# Patient Record
Sex: Female | Born: 1935 | Race: White | Hispanic: No | Smoking: Former smoker
Health system: Southern US, Community
[De-identification: ages and names within clinical notes are randomized; demographics above are authoritative.]

## PROBLEM LIST (undated history)

## (undated) DIAGNOSIS — F419 Anxiety disorder, unspecified: Secondary | ICD-10-CM

## (undated) DIAGNOSIS — J449 Chronic obstructive pulmonary disease, unspecified: Secondary | ICD-10-CM

## (undated) DIAGNOSIS — R609 Edema, unspecified: Secondary | ICD-10-CM

## (undated) DIAGNOSIS — I219 Acute myocardial infarction, unspecified: Secondary | ICD-10-CM

## (undated) DIAGNOSIS — I739 Peripheral vascular disease, unspecified: Secondary | ICD-10-CM

## (undated) DIAGNOSIS — IMO0001 Reserved for inherently not codable concepts without codable children: Secondary | ICD-10-CM

## (undated) DIAGNOSIS — K922 Gastrointestinal hemorrhage, unspecified: Secondary | ICD-10-CM

## (undated) DIAGNOSIS — M1990 Unspecified osteoarthritis, unspecified site: Secondary | ICD-10-CM

## (undated) DIAGNOSIS — I2510 Atherosclerotic heart disease of native coronary artery without angina pectoris: Secondary | ICD-10-CM

## (undated) DIAGNOSIS — K219 Gastro-esophageal reflux disease without esophagitis: Secondary | ICD-10-CM

## (undated) DIAGNOSIS — D649 Anemia, unspecified: Secondary | ICD-10-CM

## (undated) DIAGNOSIS — I10 Essential (primary) hypertension: Secondary | ICD-10-CM

## (undated) HISTORY — PX: CATARACT EXTRACTION W/ INTRAOCULAR LENS  IMPLANT, BILATERAL: SHX1307

## (undated) HISTORY — PX: CORONARY ANGIOPLASTY: SHX604

## (undated) HISTORY — PX: ABDOMINAL HYSTERECTOMY: SHX81

## (undated) HISTORY — PX: ABLATION: SHX5711

## (undated) HISTORY — PX: EYE SURGERY: SHX253

## (undated) HISTORY — PX: FRACTURE SURGERY: SHX138

---

## 2005-02-24 ENCOUNTER — Other Ambulatory Visit: Payer: Self-pay

## 2005-02-24 ENCOUNTER — Emergency Department: Payer: Self-pay | Admitting: Emergency Medicine

## 2005-05-12 ENCOUNTER — Encounter: Payer: Self-pay | Admitting: Cardiology

## 2005-05-26 ENCOUNTER — Encounter: Payer: Self-pay | Admitting: Cardiology

## 2005-06-26 ENCOUNTER — Encounter: Payer: Self-pay | Admitting: Cardiology

## 2005-07-26 ENCOUNTER — Encounter: Payer: Self-pay | Admitting: Cardiology

## 2005-08-26 ENCOUNTER — Encounter: Payer: Self-pay | Admitting: Cardiology

## 2006-04-08 ENCOUNTER — Ambulatory Visit: Payer: Self-pay | Admitting: Cardiology

## 2007-08-25 ENCOUNTER — Ambulatory Visit: Payer: Self-pay | Admitting: Internal Medicine

## 2007-09-19 ENCOUNTER — Ambulatory Visit: Payer: Self-pay | Admitting: Ophthalmology

## 2007-11-09 ENCOUNTER — Ambulatory Visit: Payer: Self-pay | Admitting: Ophthalmology

## 2007-11-09 ENCOUNTER — Other Ambulatory Visit: Payer: Self-pay

## 2007-11-14 ENCOUNTER — Ambulatory Visit: Payer: Self-pay | Admitting: Ophthalmology

## 2009-01-04 ENCOUNTER — Inpatient Hospital Stay: Payer: Self-pay | Admitting: Internal Medicine

## 2009-01-28 ENCOUNTER — Ambulatory Visit: Payer: Self-pay | Admitting: Gastroenterology

## 2011-06-01 ENCOUNTER — Inpatient Hospital Stay: Payer: Self-pay | Admitting: Specialist

## 2012-06-07 ENCOUNTER — Ambulatory Visit: Payer: Self-pay | Admitting: Gastroenterology

## 2014-01-23 ENCOUNTER — Ambulatory Visit: Payer: Self-pay | Admitting: Cardiology

## 2015-02-07 ENCOUNTER — Ambulatory Visit
Admit: 2015-02-07 | Disposition: A | Discharge status: Home / Self Care | Payer: Self-pay | Attending: Internal Medicine | Admitting: Internal Medicine

## 2016-02-12 ENCOUNTER — Encounter
Admission: RE | Admit: 2016-02-12 | Discharge: 2016-02-12 | Disposition: A | Discharge status: Home / Self Care | Payer: Medicare Other | Source: Ambulatory Visit | Attending: Orthopedic Surgery | Admitting: Orthopedic Surgery

## 2016-02-12 DIAGNOSIS — I10 Essential (primary) hypertension: Secondary | ICD-10-CM | POA: Diagnosis not present

## 2016-02-12 DIAGNOSIS — E785 Hyperlipidemia, unspecified: Secondary | ICD-10-CM | POA: Insufficient documentation

## 2016-02-12 DIAGNOSIS — Z01812 Encounter for preprocedural laboratory examination: Secondary | ICD-10-CM | POA: Diagnosis not present

## 2016-02-12 DIAGNOSIS — I2510 Atherosclerotic heart disease of native coronary artery without angina pectoris: Secondary | ICD-10-CM | POA: Diagnosis not present

## 2016-02-12 DIAGNOSIS — D126 Benign neoplasm of colon, unspecified: Secondary | ICD-10-CM | POA: Insufficient documentation

## 2016-02-12 DIAGNOSIS — M1611 Unilateral primary osteoarthritis, right hip: Secondary | ICD-10-CM | POA: Diagnosis not present

## 2016-02-12 DIAGNOSIS — D649 Anemia, unspecified: Secondary | ICD-10-CM | POA: Diagnosis not present

## 2016-02-12 DIAGNOSIS — Z79899 Other long term (current) drug therapy: Secondary | ICD-10-CM | POA: Diagnosis not present

## 2016-02-12 HISTORY — DX: Edema, unspecified: R60.9

## 2016-02-12 HISTORY — DX: Atherosclerotic heart disease of native coronary artery without angina pectoris: I25.10

## 2016-02-12 HISTORY — DX: Essential (primary) hypertension: I10

## 2016-02-12 HISTORY — DX: Anemia, unspecified: D64.9

## 2016-02-12 HISTORY — DX: Chronic obstructive pulmonary disease, unspecified: J44.9

## 2016-02-12 HISTORY — DX: Gastro-esophageal reflux disease without esophagitis: K21.9

## 2016-02-12 HISTORY — DX: Gastrointestinal hemorrhage, unspecified: K92.2

## 2016-02-12 HISTORY — DX: Reserved for inherently not codable concepts without codable children: IMO0001

## 2016-02-12 HISTORY — DX: Anxiety disorder, unspecified: F41.9

## 2016-02-12 HISTORY — DX: Acute myocardial infarction, unspecified: I21.9

## 2016-02-12 HISTORY — DX: Unspecified osteoarthritis, unspecified site: M19.90

## 2016-02-12 HISTORY — DX: Peripheral vascular disease, unspecified: I73.9

## 2016-02-12 LAB — URINALYSIS COMPLETE WITH MICROSCOPIC (ARMC ONLY)
Bilirubin Urine: NEGATIVE
GLUCOSE, UA: NEGATIVE mg/dL
KETONES UR: NEGATIVE mg/dL
Leukocytes, UA: NEGATIVE
NITRITE: NEGATIVE
PROTEIN: NEGATIVE mg/dL
SPECIFIC GRAVITY, URINE: 1.004 — AB (ref 1.005–1.030)
pH: 7 (ref 5.0–8.0)

## 2016-02-12 LAB — BASIC METABOLIC PANEL WITH GFR
Anion gap: 6 (ref 5–15)
BUN: 24 mg/dL — ABNORMAL HIGH (ref 6–20)
CO2: 30 mmol/L (ref 22–32)
Calcium: 9.5 mg/dL (ref 8.9–10.3)
Chloride: 105 mmol/L (ref 101–111)
Creatinine, Ser: 0.64 mg/dL (ref 0.44–1.00)
GFR calc Af Amer: >60 mL/min
GFR calc non Af Amer: >60 mL/min
Glucose, Bld: 102 mg/dL — ABNORMAL HIGH (ref 65–99)
Potassium: 3.9 mmol/L (ref 3.5–5.1)
Sodium: 141 mmol/L (ref 135–145)

## 2016-02-12 LAB — TYPE AND SCREEN
ABO/RH(D): B POS
Antibody Screen: NEGATIVE

## 2016-02-12 LAB — CBC
HCT: 44.4 % (ref 35.0–47.0)
Hemoglobin: 14.4 g/dL (ref 12.0–16.0)
MCH: 27.4 pg (ref 26.0–34.0)
MCHC: 32.6 g/dL (ref 32.0–36.0)
MCV: 84.3 fL (ref 80.0–100.0)
PLATELETS: 167 10*3/uL (ref 150–440)
RBC: 5.26 MIL/uL — AB (ref 3.80–5.20)
RDW: 14.8 % — AB (ref 11.5–14.5)
WBC: 7.6 10*3/uL (ref 3.6–11.0)

## 2016-02-12 LAB — PROTIME-INR
INR: 1.03
Prothrombin Time: 13.7 s (ref 11.4–15.0)

## 2016-02-12 LAB — SEDIMENTATION RATE: Sed Rate: 3 mm/h (ref 0–30)

## 2016-02-12 LAB — APTT: aPTT: 25 s (ref 24–36)

## 2016-02-12 LAB — SURGICAL PCR SCREEN
MRSA, PCR: NEGATIVE
Staphylococcus aureus: NEGATIVE

## 2016-02-12 NOTE — Patient Instructions (Signed)
  Your procedure is scheduled on:02/20/16 Report to Day Surgery. [facility]OND FLOOR To find out your arrival time please call [phone] between 1PM - 3PM on 02/19/16  Remember: Instructions that are not followed completely may result in serious medical risk, up to and including death, or upon the discretion of your surgeon and anesthesiologist your surgery may need to be rescheduled.    _X___ 1. Do not eat food or drink liquids after midnight. No gum chewing or hard candies.     _X___ 2. No Alcohol for 24 hours before or after surgery.   ____ 3. Bring all medications with you on the day of surgery if instructed.    __X__ 4. Notify your doctor if there is any change in your medical condition     (cold, fever, infections).     Do not wear jewelry, make-up, hairpins, clips or nail polish.  Do not wear lotions, powders, or perfumes. You may wear deodorant.  Do not shave 48 hours prior to surgery. Men may shave face and neck.  Do not bring valuables to the hospital.    [facility] is not responsible for any belongings or valuables.               Contacts, dentures or bridgework may not be worn into surgery.  Leave your suitcase in the car. After surgery it may be brought to your room.  For patients admitted to the hospital, discharge time is determined by your                treatment team.   Patients discharged the day of surgery will not be allowed to drive home.   Please read over the following fact sheets that you were given:   Surgical Site Infection Prevention   __X__ Take these medicines the morning of surgery with A SIP OF WATER:    1. ALPRAZOLAM  2. AMLODIPINE  3. ISOSORBIDE  4.LISINOPRIL  5.METOPROLOL  6.OMEPRAZOLE AT BEDTIME 02/19/16 AND AM OF SURGERY 02/20/16  ____ Fleet Enema (as directed)   __X__ Use CHG Soap as directed  _X___ Use inhalers on the day of surgery  ____ Stop metformin 2 days prior to surgery    ____ Take 1/2 of usual insulin dose the  night before surgery and none on the morning of surgery.   _X___ Stop Coumadin/Plavix/aspirin on   STOP ASPIRIN 02/13/16 UNTIL AFTER SURGERY  _X___ Stop Anti-inflammatories on   STOP ADVIL 02/13/16 UNTIL AFTER SURGERY   ____ Stop supplements until after surgery.    ____ Bring C-Pap to the hospital.

## 2016-02-12 NOTE — Pre-Procedure Instructions (Signed)
Ashley [name], Ashley [name] [address]  [id number] [facility]) [id number] (Mobile) [email or web address]  English (Preferred) Other Race / Not Hispanic or Latino  Reason for Referral Outgoing Referral Reason Specialty Diagnoses / Procedures Referred By Contact Referred To Contact  Other Medical (Routine)  Pending Review    Diagnoses  Pre-op exam  Procedures  ECG 12 Lead  Loni Muse, MD  [place], [place] [address]  Phone: [phone]  Fax: [phone]     Reason for Visit Reason Comments  Pre-op Exam   Encounter Details Date Type Department Care Team Description  02/10/2016 Office Visit [facility]  [address] [place], [place] [address]  [phone]  Loni Muse, MD  [address]  [place], [place] [address]  [phone]  [phone] (Fax)  Pre-op exam (Primary Dx); Coronary artery disease involving native coronary artery of native heart without angina pectoris; Hyperlipidemia, unspecified hyperlipidemia type; Tobacco abuse  Last Filed Vital Signs Vital Sign Reading Time Taken  Blood Pressure 136/84 02/10/2016 9:36 AM EDT  Pulse 56 02/10/2016 9:36 AM EDT  Temperature - -  Respiratory Rate - -  Height 1.651 m (5\' 5" ) 02/10/2016 9:36 AM EDT  Weight 87 kg (191 lb 12.8 oz) 02/10/2016 9:36 AM EDT  Body Mass Index 31.92 02/10/2016 9:36 AM EDT  Oxygen Saturation 96% 02/10/2016 9:36 AM EDT  Instructions Patient Instructions - Loni Muse, MD - 02/10/2016 9:57 AM EDT Your doctors have asked [place] to determine if you are at increased risk for heart problems during the proposed surgery. You are not at increased risk and need no further heart testing prior to the procedure.  You may stop aspirin and effient 5 to 7 days before and restart after surgery when wounds sufficiently healed, usually 5 to 7 days.  Progress Notes Loni Muse, MD - 02/10/2016 9:17 AM EDT Formatting of this note may be different from the original. Cardiology Consultation Note  Requesting Provider: [facility] Clayburn Pert MD  Primary Provider: [place]   Reason for Consult:  82 yo female seen in cardiology consultation at the request of D[facility] and Clayburn Pert MD for preoperative cardiology evaluation and risk assessment prior to gall bladder surgery.  Assessment & Plan:  1. Pre-op exam  The patient has no active cardiac conditions (decompensated heart failure, unstable arrhythmia, acute coronary syndrome, or severe valvular disease) as defined by the most recent perioperative guidelines.  The patient is not at increased or high risk of a cardiovascular complication in association with the proposed noncardiac surgery. No further cardiac work up is indicated or recommended.  Patient may stop aspirin and effient 5 to 7 days before and restart after surgery when wounds sufficiently healed, usually 5 to 7 days.  2. Coronary artery disease involving native coronary artery of native heart without angina pectoris Patient had STEMI 5.29.12 opened bare metal stents at [facility] (details below and on Care Everywhere).  She had some depression of LV function by echo immediately after the 5.29.12 event with an EF of ~40% with inferior hypokinesis.  3. Hyperlipidemia, unspecified hyperlipidemia type On crestor  4. Tobacco abuse Pre contemplative  History of Present Illness: Patient is scheduled for gall bladder surgery with Dr. Adonis Huguenin  Cardiovascular History: [name] is a 82 year old female smoker with history of anginal symptoms with cath showing non-occlusive disease in 01/2011 who presented 5.29.12  to [facility] with acute on chronic chest pain and STEMI.  Catheterization showed RCA occlusion at branch treated with BMS x 3.  Per [facility] cath report: Coronary Artery Disease Left Main:  normal LAD system: insignificant LCX system: normal RCA system: total STENT, CORONARY INTEGRITY RX 2.25X18MM STENT, CORONARY INTEGRITY RX 2.25X8MM STENT, CORONARY INTEGRITY RX 2.75X18MM  Since then, pt had one episode of left arm tingling. Went to [facility], had negative stress test, told it was a pinched nerve - all per patient.  I have also reviewed the patient's medical records and imaging prior to today's visit.  Functional Capacity: >4 METS  Symptoms: no CP, SOB, DOE, orthopnea, PND, palpitations, light-headedness, cough, wheezing.  History of an arrythmia - none  No: LE edema, syncope, or pre-syncope, claudication,   No history of obstructive sleep apnea, snoring, waking up in the middle of the night choking,   No known bleeding issues - none  History of clots: PE or DVT- none  No personal history of anesthesia reaction or sudden high fever after surgery.   No hx of reactions to unfractionated heparin or lovenox   Contraindications (check if positive)  [ ]  Unstable coronary syndromes including unstable or severe angina or recent MI  [ ]  Decompensated heart failure including NYHA functional class IV or worsening or new-onset HF  [ ]  Significant arrhythmias including high grade AV block, symptomatic ventricular arrhythmias, supraventricular arrhythmias with ventricular rate > 100 bpm at rest, symptomatic bradycardia, and newly recognized ventricular tachycardia  [ ]  Severe heart valve disease including severe aortic stenosis or symptomatic mitral stenosis  [x ] NONE   Revised Cardiac Risk Index (check if positive)  [ ]  High-risk type of surgery  [ ]  History of ischemic heart disease (history of MI or a positive exercise test, current complaint of chest pain considered to be secondary to myocardial ischemia, use of nitrate therapy, or ECG with pathological Q waves; do not count prior coronary revascularization procedure unless one of the other criteria for ischemic  heart disease is present)  [ ]  History of heart failure  [ ]  History of cerebrovascular disease  [ ]  Diabetes Mellitus requiring treatment with insulin  [ ]  Preoperative serum creatinine > 2.0 mg/dL  [x ] NONE  Past Medical & Surgical History: No past medical history on file. No past surgical history on file.  Allergies: Allergies  Allergen Reactions  . Amoxicillin Itching   Pertinent Medications (complete listing reviewed in EMR):  Reviewed in EMR  Social History: Social History  Substance Use Topics  . Smoking status: Current Every Day Smoker  Types: Cigarettes  . Smokeless tobacco: Never Used  . Alcohol Use: Not on file  She has no drug history on file.  Family History: Her family history is not on file.  No family history of premature CAD, sudden death, or problems with anesthesia.  Review of Systems: Review of ten systems is negative or unremarkable except as stated above.  The problem list, allergies, prior labs, relevant cardiac tests, and current medications were reviewed and updated as appropriate in the [email or web address]  medical record.  Physical Exam: VITAL SIGNS: BP 136/84 mmHg  Pulse 56  Ht 165.1 cm (5\' 5" )  Wt 87 kg (191 lb 12.8 oz)  BMI 31.92 kg/m2  SpO2 96% GENERAL: WD,WN,NAD HEENT: Normocephalic and atraumatic.Conjunctivae and sclerae clear and anicteric. No xanthelasma.  NECK: Supple, without masses, thyroid enlargement or adenopathy. No bruits. CARDIOVASCULAR: RRR, The cardiac examination revealed normal carotid and jugular venous  pulses; the left ventricular apex impulse was sustained with a palpable A-wave; the first heart sound was normal; S2 was normal with physiologic splitting; a fourth heart sound was heard best at the mitral area with the patient in the left lateral decubitus position; there was no S3 or friction rub. RESPIRATORY: Normal respiratory effort without use of accessory muscles. Clear to auscultation bilaterally. No wheezes, rales, or  ronchi. No dullness to percussion. ABDOMEN: Soft, not tender or distended, with audible bowel sounds. No palpable organ enlargement or abnormal masses. EXTREMITIES: No cyanosis, clubbing or edema. Ambulatory ability satisfactory. SKIN: No rashes, ecchymosis or petechiae. Warm, dry, and intact. NEUROLOGIC: Appropriate mood and affect. Alert and oriented to person, place, and time. No gross motor or sensory deficits evident. Patient understands the reason for the cardiology evaluation and the implications for surgery.  Pertinent Laboratory Studies:  No results found for: PROBNP, TRIG, HDL, NONHDL, LDL, CREATININE, BUN, K, MG, WBC, HGB, HCT, PLT, INR  No results found for: A1C No results found for: HSCRP No results found for: HOMOCYSTEINE  Pertinent Test Results: ECG: sinus bradycardia, NSC SLT  Imaging: No cardiac imaging done today.    Plan of Care Pending Results Ordered this Visit Priority Associated Diagnoses Date/Time  ECG 12 Lead Routine Pre-op exam  02/10/2016 9:15 AM EDT  Visit Diagnoses   Pre-op exam - Primary   Coronary artery disease involving native coronary artery of native heart without angina pectoris   Hyperlipidemia, unspecified hyperlipidemia type   Tobacco abuse Tobacco use disorder  Discontinued Medications Prescription Sig. Discontinue Reason Start Date End Date  rosuvastatin (CRESTOR) 20 MG tablet Take 20 mg by mouth daily. Duplicate order  [id number]  prasugrel (EFFIENT) 10 mg tablet Take 10 mg by mouth daily. Duplicate order  [id number]  metoprolol tartrate (LOPRESSOR) 25 MG tablet Take 25 mg by mouth Two (2) times a day. Duplicate order  [id number]  lisinopril (PRINIVIL,ZESTRIL) 20 MG tablet Take 20 mg by mouth daily. Duplicate order  [id number]  albuterol (PROVENTIL HFA;VENTOLIN HFA) 90 mcg/actuation inhaler Inhale 2 puffs.   02/10/2016  esomeprazole (NEXIUM) 40 MG capsule Take 40 mg by mouth every morning before breakfast.   02/10/2016   ranitidine (ZANTAC) 150 MG tablet Take 150 mg by mouth two (2) times a day as needed for heartburn.   02/10/2016  Historical Medications - Added in This Encounter This list may reflect changes made after this encounter.  Medication Sig. Disp. Refills Start Date End Date  esomeprazole (NEXIUM) 20 MG capsule Take 20 mg by mouth Two (2) times a day.      aspirin-caffeine (ANACIN) 400-32 mg Tab Take 1-2 tablets by mouth daily as needed.      umeclidinium (INCRUSE ELLIPTA) 62.5 mcg/actuation inhaler Inhale 1 puff.      nitroglycerin (NITROSTAT) 0.4 MG SL tablet Place 0.4 mg under the tongue Take as directed.   05/17/2014   rosuvastatin (CRESTOR) 20 MG tablet Take 20 mg by mouth every morning before breakfast.      prasugrel (EFFIENT) 10 mg tablet Take 10 mg by mouth every morning before breakfast.      lisinopril (PRINIVIL,ZESTRIL) 20 MG tablet Take 20 mg by mouth every morning before breakfast.      metoprolol tartrate (LOPRESSOR) 25 MG tablet Take 25 mg by mouth Two (2) times a day.   03/27/2011   meloxicam (MOBIC) 15 MG tablet Take 15 mg by mouth every morning before breakfast.   01/19/2016   albuterol (PROVENTIL HFA;VENTOLIN HFA) 90 mcg/actuation  inhaler Inhale 2 puffs.    02/10/2016  Document Information Primary Care Provider [facility] (Nov. 24, 2015 - Present) [phone] (Work) [phone] (Fax)  [phone] [facility] [address] [place], [place] [address] Document Coverage Dates Apr. 17, 2017 - Apr. 17, 2017 [place] [address] [place], [place] [address] Encounter Providers Loni Muse (Attending) [phone] (Work) [phone] (Fax)  [address] [place], [place] [address]

## 2016-02-13 LAB — ABO/RH: ABO/RH(D): B POS

## 2016-02-14 LAB — URINE CULTURE: SPECIAL REQUESTS: NORMAL

## 2016-02-14 NOTE — Pre-Procedure Instructions (Signed)
Urine culture sent to Dr. Rudene Christians for review.  Asked if wanted recollected and / or treated?

## 2016-02-20 ENCOUNTER — Inpatient Hospital Stay: Payer: Medicare Other

## 2016-02-20 ENCOUNTER — Inpatient Hospital Stay: Payer: Medicare Other | Admitting: Anesthesiology

## 2016-02-20 ENCOUNTER — Inpatient Hospital Stay
Admission: RE | Admit: 2016-02-20 | Discharge: 2016-02-23 | DRG: 470 | Disposition: A | Discharge status: Skilled Nursing Facility | Payer: Medicare Other | Source: Ambulatory Visit | Attending: Internal Medicine | Admitting: Internal Medicine

## 2016-02-20 ENCOUNTER — Encounter
Admission: RE | Disposition: A | Discharge status: Skilled Nursing Facility | Payer: Self-pay | Source: Ambulatory Visit | Attending: Internal Medicine

## 2016-02-20 ENCOUNTER — Encounter
Admission: RE | Admit: 2016-02-20 | Discharge: 2016-02-20 | Disposition: A | Discharge status: Home / Self Care | Payer: Medicare Other | Source: Ambulatory Visit | Attending: Internal Medicine | Admitting: Internal Medicine

## 2016-02-20 ENCOUNTER — Encounter: Payer: Self-pay | Admitting: *Deleted

## 2016-02-20 DIAGNOSIS — R079 Chest pain, unspecified: Secondary | ICD-10-CM | POA: Diagnosis present

## 2016-02-20 DIAGNOSIS — K219 Gastro-esophageal reflux disease without esophagitis: Secondary | ICD-10-CM | POA: Diagnosis present

## 2016-02-20 DIAGNOSIS — F419 Anxiety disorder, unspecified: Secondary | ICD-10-CM | POA: Diagnosis present

## 2016-02-20 DIAGNOSIS — I10 Essential (primary) hypertension: Secondary | ICD-10-CM | POA: Diagnosis present

## 2016-02-20 DIAGNOSIS — Z87891 Personal history of nicotine dependence: Secondary | ICD-10-CM

## 2016-02-20 DIAGNOSIS — I252 Old myocardial infarction: Secondary | ICD-10-CM

## 2016-02-20 DIAGNOSIS — Z419 Encounter for procedure for purposes other than remedying health state, unspecified: Secondary | ICD-10-CM

## 2016-02-20 DIAGNOSIS — E785 Hyperlipidemia, unspecified: Secondary | ICD-10-CM | POA: Diagnosis present

## 2016-02-20 DIAGNOSIS — J449 Chronic obstructive pulmonary disease, unspecified: Secondary | ICD-10-CM | POA: Diagnosis present

## 2016-02-20 DIAGNOSIS — E669 Obesity, unspecified: Secondary | ICD-10-CM | POA: Diagnosis present

## 2016-02-20 DIAGNOSIS — Z955 Presence of coronary angioplasty implant and graft: Secondary | ICD-10-CM

## 2016-02-20 DIAGNOSIS — G8918 Other acute postprocedural pain: Secondary | ICD-10-CM

## 2016-02-20 DIAGNOSIS — I2510 Atherosclerotic heart disease of native coronary artery without angina pectoris: Secondary | ICD-10-CM | POA: Diagnosis present

## 2016-02-20 DIAGNOSIS — Z6836 Body mass index (BMI) 36.0-36.9, adult: Secondary | ICD-10-CM

## 2016-02-20 DIAGNOSIS — I739 Peripheral vascular disease, unspecified: Secondary | ICD-10-CM | POA: Diagnosis present

## 2016-02-20 DIAGNOSIS — M1611 Unilateral primary osteoarthritis, right hip: Secondary | ICD-10-CM | POA: Diagnosis present

## 2016-02-20 HISTORY — PX: TOTAL HIP ARTHROPLASTY: SHX124

## 2016-02-20 LAB — CBC
HEMATOCRIT: 38.4 % (ref 35.0–47.0)
Hemoglobin: 12.2 g/dL (ref 12.0–16.0)
MCH: 27.3 pg (ref 26.0–34.0)
MCHC: 31.8 g/dL — AB (ref 32.0–36.0)
MCV: 86 fL (ref 80.0–100.0)
PLATELETS: 160 10*3/uL (ref 150–440)
RBC: 4.46 MIL/uL (ref 3.80–5.20)
RDW: 15.3 % — AB (ref 11.5–14.5)
WBC: 12.8 10*3/uL — ABNORMAL HIGH (ref 3.6–11.0)

## 2016-02-20 LAB — CREATININE, SERUM
Creatinine, Ser: 0.66 mg/dL (ref 0.44–1.00)
GFR calc Af Amer: >60 mL/min (ref >60)
GFR calc non Af Amer: >60 mL/min (ref >60)

## 2016-02-20 LAB — TROPONIN I: Troponin I: <0.03 ng/mL (ref <0.031)

## 2016-02-20 LAB — CKMB (ARMC ONLY): CK, MB: 10.1 ng/mL — ABNORMAL HIGH (ref 0.5–5.0)

## 2016-02-20 SURGERY — ARTHROPLASTY, HIP, TOTAL, ANTERIOR APPROACH
Anesthesia: General | Site: Hip | Laterality: Right | Wound class: Clean

## 2016-02-20 MED ORDER — LIDOCAINE HCL (PF) 1 % IJ SOLN
INTRAMUSCULAR | Status: AC
  Filled 2016-02-20: qty 30

## 2016-02-20 MED ORDER — PHENOL 1.4 % MT LIQD: 1.0000 | OROMUCOSAL | Status: DC | PRN

## 2016-02-20 MED ORDER — LIDOCAINE HCL 1 % IJ SOLN
INTRAMUSCULAR | Status: DC | PRN
  Administered 2016-02-20: 30 mL

## 2016-02-20 MED ORDER — MENTHOL 3 MG MT LOZG: 1.0000 | LOZENGE | OROMUCOSAL | Status: DC | PRN

## 2016-02-20 MED ORDER — GLYCOPYRROLATE 0.2 MG/ML IJ SOLN
INTRAMUSCULAR | Status: DC | PRN
  Administered 2016-02-20: 0.2 mg via INTRAVENOUS

## 2016-02-20 MED ORDER — MIDAZOLAM HCL 5 MG/5ML IJ SOLN
INTRAMUSCULAR | Status: DC | PRN
  Administered 2016-02-20 (×2): 1 mg via INTRAVENOUS

## 2016-02-20 MED ORDER — TRANEXAMIC ACID 1000 MG/10ML IV SOLN
INTRAVENOUS | Status: AC
  Filled 2016-02-20: qty 10

## 2016-02-20 MED ORDER — ISOSORBIDE DINITRATE 20 MG PO TABS
30.0000 mg | ORAL_TABLET | Freq: Two times a day (BID) | ORAL | Status: DC
  Administered 2016-02-20 – 2016-02-23 (×6): 30 mg via ORAL
  Filled 2016-02-20 (×6): qty 2

## 2016-02-20 MED ORDER — IPRATROPIUM-ALBUTEROL 20-100 MCG/ACT IN AERS: 1.0000 | INHALATION_SPRAY | Freq: Four times a day (QID) | RESPIRATORY_TRACT | Status: DC

## 2016-02-20 MED ORDER — ALPRAZOLAM 0.25 MG PO TABS
0.2500 mg | ORAL_TABLET | Freq: Two times a day (BID) | ORAL | Status: DC | PRN
  Administered 2016-02-21 – 2016-02-22 (×3): 0.25 mg via ORAL
  Filled 2016-02-20 (×3): qty 1

## 2016-02-20 MED ORDER — CEFAZOLIN SODIUM-DEXTROSE 2-4 GM/100ML-% IV SOLN
INTRAVENOUS | Status: AC
  Filled 2016-02-20: qty 100

## 2016-02-20 MED ORDER — VITAMIN D 1000 UNITS PO TABS
2000.0000 [IU] | ORAL_TABLET | Freq: Every morning | ORAL | Status: DC
  Administered 2016-02-21 – 2016-02-23 (×3): 2000 [IU] via ORAL
  Filled 2016-02-20 (×3): qty 2

## 2016-02-20 MED ORDER — ONDANSETRON HCL 4 MG/2ML IJ SOLN: 4.0000 mg | Freq: Four times a day (QID) | INTRAMUSCULAR | Status: DC | PRN

## 2016-02-20 MED ORDER — ASPIRIN 81 MG PO CHEW
81.0000 mg | CHEWABLE_TABLET | ORAL | Status: AC
  Administered 2016-02-20: 81 mg via ORAL
  Filled 2016-02-20: qty 1

## 2016-02-20 MED ORDER — CEFAZOLIN SODIUM-DEXTROSE 2-4 GM/100ML-% IV SOLN
2.0000 g | Freq: Four times a day (QID) | INTRAVENOUS | Status: AC
  Administered 2016-02-20 (×2): 2 g via INTRAVENOUS
  Filled 2016-02-20 (×2): qty 100

## 2016-02-20 MED ORDER — ACETAMINOPHEN 650 MG RE SUPP: 650.0000 mg | Freq: Four times a day (QID) | RECTAL | Status: DC | PRN

## 2016-02-20 MED ORDER — ACETAMINOPHEN 10 MG/ML IV SOLN
INTRAVENOUS | Status: AC
  Filled 2016-02-20: qty 100

## 2016-02-20 MED ORDER — METOCLOPRAMIDE HCL 5 MG/ML IJ SOLN: 5.0000 mg | Freq: Three times a day (TID) | INTRAMUSCULAR | Status: DC | PRN

## 2016-02-20 MED ORDER — LACTATED RINGERS IV SOLN
INTRAVENOUS | Status: DC
  Administered 2016-02-20: 07:00:00 via INTRAVENOUS

## 2016-02-20 MED ORDER — MAGNESIUM HYDROXIDE 400 MG/5ML PO SUSP: 30.0000 mL | Freq: Every day | ORAL | Status: DC | PRN

## 2016-02-20 MED ORDER — TRANEXAMIC ACID 1000 MG/10ML IV SOLN
1000.0000 mg | INTRAVENOUS | Status: DC | PRN
  Administered 2016-02-20: 1000 mg via TOPICAL

## 2016-02-20 MED ORDER — ENOXAPARIN SODIUM 40 MG/0.4ML ~~LOC~~ SOLN
40.0000 mg | SUBCUTANEOUS | Status: DC
  Administered 2016-02-21 – 2016-02-23 (×3): 40 mg via SUBCUTANEOUS
  Filled 2016-02-20 (×3): qty 0.4

## 2016-02-20 MED ORDER — FENTANYL CITRATE (PF) 100 MCG/2ML IJ SOLN
INTRAMUSCULAR | Status: DC | PRN
  Administered 2016-02-20 (×2): 50 ug via INTRAVENOUS

## 2016-02-20 MED ORDER — ONDANSETRON HCL 4 MG/2ML IJ SOLN: 4.0000 mg | Freq: Once | INTRAMUSCULAR | Status: DC | PRN

## 2016-02-20 MED ORDER — MORPHINE SULFATE (PF) 2 MG/ML IV SOLN
2.0000 mg | INTRAVENOUS | Status: DC | PRN
  Administered 2016-02-20 (×3): 2 mg via INTRAVENOUS
  Filled 2016-02-20 (×3): qty 1

## 2016-02-20 MED ORDER — ACETAMINOPHEN 325 MG PO TABS
650.0000 mg | ORAL_TABLET | Freq: Four times a day (QID) | ORAL | Status: DC | PRN
  Administered 2016-02-21 – 2016-02-23 (×3): 650 mg via ORAL
  Filled 2016-02-20 (×3): qty 2

## 2016-02-20 MED ORDER — FENTANYL CITRATE (PF) 100 MCG/2ML IJ SOLN
25.0000 ug | INTRAMUSCULAR | Status: DC | PRN
  Administered 2016-02-20 (×4): 25 ug via INTRAVENOUS

## 2016-02-20 MED ORDER — VITAMIN B-12 1000 MCG PO TABS
2000.0000 ug | ORAL_TABLET | Freq: Every day | ORAL | Status: DC
  Administered 2016-02-21 – 2016-02-23 (×3): 2000 ug via ORAL
  Filled 2016-02-20 (×3): qty 2

## 2016-02-20 MED ORDER — DOCUSATE SODIUM 100 MG PO CAPS
100.0000 mg | ORAL_CAPSULE | Freq: Two times a day (BID) | ORAL | Status: DC
  Administered 2016-02-20 – 2016-02-23 (×6): 100 mg via ORAL
  Filled 2016-02-20 (×6): qty 1

## 2016-02-20 MED ORDER — NEOMYCIN-POLYMYXIN B GU 40-200000 IR SOLN
Status: AC
  Filled 2016-02-20: qty 4

## 2016-02-20 MED ORDER — ZOLPIDEM TARTRATE 5 MG PO TABS: 5.0000 mg | ORAL_TABLET | Freq: Every evening | ORAL | Status: DC | PRN

## 2016-02-20 MED ORDER — METHOCARBAMOL 500 MG PO TABS
500.0000 mg | ORAL_TABLET | Freq: Four times a day (QID) | ORAL | Status: DC | PRN
  Administered 2016-02-20: 500 mg via ORAL
  Filled 2016-02-20: qty 1

## 2016-02-20 MED ORDER — KETAMINE HCL 50 MG/ML IJ SOLN
INTRAMUSCULAR | Status: DC | PRN
  Administered 2016-02-20: 25 mg via INTRAMUSCULAR

## 2016-02-20 MED ORDER — ACETAMINOPHEN 10 MG/ML IV SOLN
INTRAVENOUS | Status: DC | PRN
  Administered 2016-02-20: 1000 mg via INTRAVENOUS

## 2016-02-20 MED ORDER — ONDANSETRON HCL 4 MG PO TABS: 4.0000 mg | ORAL_TABLET | Freq: Four times a day (QID) | ORAL | Status: DC | PRN

## 2016-02-20 MED ORDER — FENTANYL CITRATE (PF) 100 MCG/2ML IJ SOLN
INTRAMUSCULAR | Status: AC
  Filled 2016-02-20: qty 2

## 2016-02-20 MED ORDER — METOCLOPRAMIDE HCL 10 MG PO TABS: 5.0000 mg | ORAL_TABLET | Freq: Three times a day (TID) | ORAL | Status: DC | PRN

## 2016-02-20 MED ORDER — BUPIVACAINE IN DEXTROSE 0.75-8.25 % IT SOLN
INTRATHECAL | Status: DC | PRN
  Administered 2016-02-20: 1.6 mL via INTRATHECAL

## 2016-02-20 MED ORDER — ALUM & MAG HYDROXIDE-SIMETH 200-200-20 MG/5ML PO SUSP
30.0000 mL | ORAL | Status: DC | PRN
  Administered 2016-02-20: 30 mL via ORAL
  Filled 2016-02-20: qty 30

## 2016-02-20 MED ORDER — MAGNESIUM CITRATE PO SOLN: 1.0000 | Freq: Once | ORAL | Status: DC | PRN

## 2016-02-20 MED ORDER — PANTOPRAZOLE SODIUM 40 MG PO TBEC
40.0000 mg | DELAYED_RELEASE_TABLET | Freq: Every day | ORAL | Status: DC
  Administered 2016-02-21: 40 mg via ORAL
  Filled 2016-02-20: qty 1

## 2016-02-20 MED ORDER — OXYCODONE HCL 5 MG PO TABS
5.0000 mg | ORAL_TABLET | ORAL | Status: DC | PRN
  Administered 2016-02-20 – 2016-02-21 (×4): 5 mg via ORAL
  Filled 2016-02-20 (×4): qty 1

## 2016-02-20 MED ORDER — LIDOCAINE HCL (CARDIAC) 20 MG/ML IV SOLN
INTRAVENOUS | Status: DC | PRN
  Administered 2016-02-20: 50 mg via INTRAVENOUS

## 2016-02-20 MED ORDER — METHOCARBAMOL 1000 MG/10ML IJ SOLN
500.0000 mg | Freq: Four times a day (QID) | INTRAVENOUS | Status: DC | PRN
  Filled 2016-02-20: qty 5

## 2016-02-20 MED ORDER — METOPROLOL SUCCINATE ER 50 MG PO TB24
75.0000 mg | ORAL_TABLET | Freq: Every day | ORAL | Status: DC
  Administered 2016-02-21 – 2016-02-23 (×3): 75 mg via ORAL
  Filled 2016-02-20 (×3): qty 1

## 2016-02-20 MED ORDER — BUPIVACAINE-EPINEPHRINE 0.25% -1:200000 IJ SOLN
INTRAMUSCULAR | Status: DC | PRN
  Administered 2016-02-20: 30 mL

## 2016-02-20 MED ORDER — BUPIVACAINE-EPINEPHRINE (PF) 0.25% -1:200000 IJ SOLN
INTRAMUSCULAR | Status: AC
  Filled 2016-02-20: qty 30

## 2016-02-20 MED ORDER — PHENYLEPHRINE HCL 10 MG/ML IJ SOLN
INTRAMUSCULAR | Status: DC | PRN
  Administered 2016-02-20 (×3): 100 ug via INTRAVENOUS

## 2016-02-20 MED ORDER — IPRATROPIUM-ALBUTEROL 0.5-2.5 (3) MG/3ML IN SOLN
3.0000 mL | Freq: Four times a day (QID) | RESPIRATORY_TRACT | Status: DC
  Administered 2016-02-20 – 2016-02-21 (×3): 3 mL via RESPIRATORY_TRACT
  Filled 2016-02-20 (×4): qty 3

## 2016-02-20 MED ORDER — LISINOPRIL 5 MG PO TABS
5.0000 mg | ORAL_TABLET | Freq: Every day | ORAL | Status: DC
  Administered 2016-02-21 – 2016-02-23 (×3): 5 mg via ORAL
  Filled 2016-02-20 (×3): qty 1

## 2016-02-20 MED ORDER — CEFAZOLIN SODIUM-DEXTROSE 2-4 GM/100ML-% IV SOLN
2.0000 g | Freq: Once | INTRAVENOUS | Status: AC
  Administered 2016-02-20: 2 g via INTRAVENOUS

## 2016-02-20 MED ORDER — PROPOFOL 500 MG/50ML IV EMUL
INTRAVENOUS | Status: DC | PRN
  Administered 2016-02-20: 50 ug/kg/min via INTRAVENOUS

## 2016-02-20 MED ORDER — VITAMIN B-12 ER 2000 MCG PO TBCR: 1.0000 | EXTENDED_RELEASE_TABLET | Freq: Every morning | ORAL | Status: DC

## 2016-02-20 MED ORDER — SODIUM CHLORIDE 0.9 % IV SOLN
INTRAVENOUS | Status: DC
  Administered 2016-02-20 (×2): via INTRAVENOUS

## 2016-02-20 MED ORDER — ONDANSETRON HCL 4 MG/2ML IJ SOLN
INTRAMUSCULAR | Status: DC | PRN
  Administered 2016-02-20: 4 mg via INTRAVENOUS

## 2016-02-20 MED ORDER — NEOMYCIN-POLYMYXIN B GU 40-200000 IR SOLN
Status: DC | PRN
  Administered 2016-02-20: 4 mL

## 2016-02-20 MED ORDER — BISACODYL 5 MG PO TBEC
5.0000 mg | DELAYED_RELEASE_TABLET | Freq: Every day | ORAL | Status: DC | PRN
  Administered 2016-02-22: 5 mg via ORAL
  Filled 2016-02-20: qty 1

## 2016-02-20 MED ORDER — AMLODIPINE BESYLATE 10 MG PO TABS
10.0000 mg | ORAL_TABLET | Freq: Every day | ORAL | Status: DC
  Administered 2016-02-21 – 2016-02-23 (×3): 10 mg via ORAL
  Filled 2016-02-20 (×3): qty 1

## 2016-02-20 MED ORDER — SIMVASTATIN 40 MG PO TABS
40.0000 mg | ORAL_TABLET | Freq: Every day | ORAL | Status: DC
  Administered 2016-02-21 – 2016-02-23 (×3): 40 mg via ORAL
  Filled 2016-02-20 (×3): qty 1

## 2016-02-20 MED ORDER — EPHEDRINE SULFATE 50 MG/ML IJ SOLN
INTRAMUSCULAR | Status: DC | PRN
  Administered 2016-02-20 (×2): 5 mg via INTRAVENOUS

## 2016-02-20 SURGICAL SUPPLY — 48 items
BLADE SAW SAG 18.5X105 (BLADE) ×3 IMPLANT
BNDG COHESIVE 6X5 TAN STRL LF (GAUZE/BANDAGES/DRESSINGS) ×6 IMPLANT
CANISTER SUCT 1200ML W/VALVE (MISCELLANEOUS) ×3 IMPLANT
CAPT HIP TOTAL 3 ×3 IMPLANT
CATH FOL LEG HOLDER (MISCELLANEOUS) ×3 IMPLANT
CATH TRAY METER 16FR LF (MISCELLANEOUS) ×3 IMPLANT
CHLORAPREP W/TINT 26ML (MISCELLANEOUS) ×3 IMPLANT
CUP MEDICINE 2OZ PLAST GRAD ST (MISCELLANEOUS) ×3 IMPLANT
DECANTER SPIKE VIAL GLASS SM (MISCELLANEOUS) ×6 IMPLANT
DRAPE C-ARM XRAY 36X54 (DRAPES) ×3 IMPLANT
DRAPE INCISE IOBAN 66X60 STRL (DRAPES) IMPLANT
DRAPE POUCH INSTRU U-SHP 10X18 (DRAPES) ×3 IMPLANT
DRAPE SHEET LG 3/4 BI-LAMINATE (DRAPES) ×9 IMPLANT
DRAPE STERI IOBAN 125X83 (DRAPES) ×3 IMPLANT
DRAPE TABLE BACK 80X90 (DRAPES) ×3 IMPLANT
DRSG OPSITE POSTOP 4X8 (GAUZE/BANDAGES/DRESSINGS) ×6 IMPLANT
ELECT BLADE 6.5 EXT (BLADE) ×3 IMPLANT
GAUZE SPONGE 4X4 12PLY STRL (GAUZE/BANDAGES/DRESSINGS) ×3 IMPLANT
GLOVE BIOGEL PI IND STRL 9 (GLOVE) ×1 IMPLANT
GLOVE BIOGEL PI INDICATOR 9 (GLOVE) ×2
GLOVE SURG ORTHO 9.0 STRL STRW (GLOVE) ×6 IMPLANT
GOWN STRL REUS W/ TWL LRG LVL3 (GOWN DISPOSABLE) ×1 IMPLANT
GOWN STRL REUS W/TWL LRG LVL3 (GOWN DISPOSABLE) ×2
GOWN SURG XXL (GOWNS) ×3 IMPLANT
HEMOVAC 400CC 10FR (MISCELLANEOUS) ×3 IMPLANT
HOOD PEEL AWAY FLYTE STAYCOOL (MISCELLANEOUS) ×3 IMPLANT
MAT BLUE FLOOR 46X72 FLO (MISCELLANEOUS) ×3 IMPLANT
NDL SAFETY 18GX1.5 (NEEDLE) ×3 IMPLANT
NDL SAFETY ECLIPSE 18X1.5 (NEEDLE) ×1 IMPLANT
NEEDLE HYPO 18GX1.5 SHARP (NEEDLE) ×2
NEEDLE SPNL 18GX3.5 QUINCKE PK (NEEDLE) ×3 IMPLANT
NS IRRIG 1000ML POUR BTL (IV SOLUTION) ×3 IMPLANT
PACK HIP COMPR (MISCELLANEOUS) ×3 IMPLANT
SOL PREP PVP 2OZ (MISCELLANEOUS) ×3
SOLUTION PREP PVP 2OZ (MISCELLANEOUS) ×1 IMPLANT
STAPLER SKIN PROX 35W (STAPLE) ×3 IMPLANT
STRAP SAFETY BODY (MISCELLANEOUS) ×3 IMPLANT
SUT DVC 2 QUILL PDO  T11 36X36 (SUTURE) ×2
SUT DVC 2 QUILL PDO T11 36X36 (SUTURE) ×1 IMPLANT
SUT DVC QUILL MONODERM 30X30 (SUTURE) ×3 IMPLANT
SUT SILK 0 (SUTURE) ×2
SUT SILK 0 30XBRD TIE 6 (SUTURE) ×1 IMPLANT
SUT VIC AB 1 CT1 36 (SUTURE) ×3 IMPLANT
SYR 20CC LL (SYRINGE) ×3 IMPLANT
SYR 30ML LL (SYRINGE) ×3 IMPLANT
TAPE MICROFOAM 4IN (TAPE) ×3 IMPLANT
TOWEL OR 17X26 4PK STRL BLUE (TOWEL DISPOSABLE) ×3 IMPLANT
TUBE KAMVAC SUCTION (TUBING) ×3 IMPLANT

## 2016-02-20 NOTE — Progress Notes (Signed)
Pt called, complains of chest pain. Spitting up phlegm and pieces of food. Pt complains of indigestion. Paged MD.

## 2016-02-20 NOTE — Clinical Social Work Placement (Signed)
   CLINICAL SOCIAL WORK PLACEMENT  NOTE  Date:  02/20/2016  Patient Details  Name: Ashley [name] MRN: [id number] Date of Birth: Oct 23, 1931  Clinical Social Work is seeking post-discharge placement for this patient at the [place] level of care (*CSW will initial, date and re-position this form in  chart as items are completed):  Yes   Patient/family provided with [place] Work Department's list of facilities offering this level of care within the geographic area requested by the patient (or if unable, by the patient's family).  Yes   Patient/family informed of their freedom to choose among providers that offer the needed level of care, that participate in Medicare, Medicaid or managed care program needed by the patient, have an available bed and are willing to accept the patient.  Yes   Patient/family informed of [facility]'s ownership interest in [facility] and [facility], as well as of the fact that they are under no obligation to receive care at these facilities.  PASRR submitted to EDS on 02/20/16     PASRR number received on 02/20/16     Existing PASRR number confirmed on       FL2 transmitted to all facilities in geographic area requested by pt/family on 02/20/16     FL2 transmitted to all facilities within larger geographic area on       Patient informed that his/her managed care company has contracts with or will negotiate with certain facilities, including the following:            Patient/family informed of bed offers received.  Patient chooses bed at       Physician recommends and patient chooses bed at      Patient to be transferred to   on  .  Patient to be transferred to facility by       Patient family notified on   of transfer.  Name of family member notified:        PHYSICIAN       Additional Comment:    _______________________________________________ Loralyn Freshwater, LCSW 02/20/2016, 3:12 PM

## 2016-02-20 NOTE — Progress Notes (Signed)
Dr. Rudene Christians notified of previous urinalysis results (recommend recollection). At this time Dr. Rudene Christians does not want to recollect urine to repeat the test.

## 2016-02-20 NOTE — Progress Notes (Signed)
rn spoke with dr patel re: internal med consult . md orders baby asa 81mg  po once,ckmb and troponin serials

## 2016-02-20 NOTE — Op Note (Addendum)
02/20/2016  9:44 AM  PATIENT:  Ashley [name]  82 y.o. female  PRE-OPERATIVE DIAGNOSIS:  OSTEOARTHRITIS right hip  POST-OPERATIVE DIAGNOSIS:  Osteoarthritis right hip  PROCEDURE:  Procedure(s): TOTAL HIP ARTHROPLASTY ANTERIOR APPROACH (Right)  SURGEON: Laurene Footman, MD  ASSISTANTS: None  ANESTHESIA:   general  EBL:  Total I/O In: -  Out: 700 [Urine:300; Blood:400]  BLOOD ADMINISTERED:none  DRAINS: (2) Hemovact drain(s) in the Subcutaneous layer with  Suction Open   LOCAL MEDICATIONS USED:  MARCAINE    Marcaine quarter percent with epi 30 cc, 1% Xylocaine 30 cc  SPECIMEN:  Source of Specimen:  Femoral head  DISPOSITION OF SPECIMEN:  PATHOLOGY  COUNTS:  YES  TOURNIQUET:  * No tourniquets in log *  IMPLANTS: Medacta AMIS 3 standard stem with 46 mm Mpact cup DM with S 22.2 mm head and liner  DICTATION: .Dragon Dictation   The patient was brought to the operating room and after spinal anesthesia was obtained patient was placed on the operative table with the ipsilateral foot into the Medacta attachment, contralateral leg on a well-padded table. C-arm was brought in and preop template x-ray taken. After prepping and draping in usual sterile fashion appropriate patient identification and timeout procedures were completed. Patient still had too much sensation in and around the hip to perform surgery he would local added and she was put under general anesthesia at this point. Anterior approach to the hip was obtained and centered over the greater trochanter and TFL muscle. The subcutaneous tissue was incised hemostasis being achieved by electrocautery. TFL fascia was incised and the muscle retracted laterally deep retractor placed. The lateral femoral circumflex vessels were identified and ligated. The anterior capsule was exposed and a capsulotomy performed. The neck was identified and a femoral neck cut carried out with a saw. The head was removed without difficulty and showed  sclerotic femoral head and acetabulum. Reaming was carried out to 46 mm and a 46 mm cup trial gave appropriate tightness to the acetabular component a 76 Mpact DM cup was impacted into position. The leg was then externally rotated and ischiofemoral and pubofemoral releases carried out. The femur was sequentially broached to a size 3, size 3 stem and S head trials were placed and the final components chosen. The 3 standard collared stem was inserted along with a S 22.2 mm head and 46 mm liner. The hip was reduced and was stable the wound was thoroughly irrigated with a dilute Betadine solution. The deep fascia was closed using a heavy Quill after infiltration of 30 cc of quarter percent Sensorcaine with epinephrine. TXA was injected into the joint after this deep wound closure was carried out Subcutaneous drains were then inserted. 2-0 Quill to close the skin with skin staples Xeroform and honeycomb dressing  PLAN OF CARE: Admit to inpatient

## 2016-02-20 NOTE — Progress Notes (Signed)
MD on call, Dr. Marry Guan gave orders for EKG and Maalox. Consult ordered for internal medicine to follow.

## 2016-02-20 NOTE — NC FL2 (Signed)
[place] LEVEL OF CARE SCREENING TOOL     IDENTIFICATION  Patient Name: Ashley [name] Birthdate: 08-19-1931 Sex: female Admission Date (Current Location): 02/20/2016  [place] and [place] Number:  Engineering geologist and Address:  [facility], [address] [place], [place] [address]      Provider Number: [id number]  Attending Physician Name and Address:  Hessie Knows, MD  Relative Name and Phone Number:       Current Level of Care: Hospital Recommended Level of Care: [place] Prior Approval Number:    Date Approved/Denied:   PASRR Number:  ([id number])  Discharge Plan: SNF    Current Diagnoses: Patient Active Problem List   Diagnosis Date Noted  . Primary osteoarthritis of right hip 02/20/2016   Dermatophytosis of nail    Benign neoplasm of large bowel, unspecified    Coronary atherosclerosis of native coronary artery    Iron deficiency anemia    Benign hypertension    Coronary artery disease    Hyperlipidemia, unspecified       Orientation RESPIRATION BLADDER Height & Weight     Self, Time, Situation, Place  O2 (2 Liters Oxygen ) Continent Weight: 183 lb (83.008 kg) Height:  4\' 11"  (149.9 cm)  BEHAVIORAL SYMPTOMS/MOOD NEUROLOGICAL BOWEL NUTRITION STATUS   (none )  (none ) Continent Diet (Diet: Clear Liquid )  AMBULATORY STATUS COMMUNICATION OF NEEDS Skin   Extensive Assist Verbally Surgical wounds (Incision: Right Hip. )                       Personal Care Assistance Level of Assistance  Bathing, Feeding, Dressing Bathing Assistance: Limited assistance Feeding assistance: Independent Dressing Assistance: Limited assistance     Functional Limitations Info  Sight, Hearing, Speech Sight Info: Adequate Hearing Info: Adequate Speech Info: Adequate    SPECIAL CARE FACTORS FREQUENCY  PT (By licensed PT), OT (By licensed OT)     PT Frequency:  (5) OT Frequency:  (5)             Contractures      Additional Factors Info  Code Status, Allergies Code Status Info:  (Not on File ) Allergies Info:  (No Known Allergies. )           Current Medications (02/20/2016):  This is the current hospital active medication list Current Facility-Administered Medications  Medication Dose Route Frequency Provider Last Rate Last Dose  . 0.9 %  sodium chloride infusion   Intravenous Continuous Hessie Knows, MD 75 mL/hr at 02/20/16 1055    . acetaminophen (TYLENOL) tablet 650 mg  650 mg Oral Q6H PRN Hessie Knows, MD       Or  . acetaminophen (TYLENOL) suppository 650 mg  650 mg Rectal Q6H PRN Hessie Knows, MD      . ALPRAZolam Duanne Moron) tablet 0.25 mg  0.25 mg Oral BID PRN Hessie Knows, MD      . Derrill Memo ON 02/21/2016] amLODipine (NORVASC) tablet 10 mg  10 mg Oral Daily Hessie Knows, MD      . bisacodyl (DULCOLAX) EC tablet 5 mg  5 mg Oral Daily PRN Hessie Knows, MD      . ceFAZolin (ANCEF) 2-4 GM/100ML-% IVPB           . ceFAZolin (ANCEF) IVPB 2g/100 mL premix  2 g Intravenous Q6H Hessie Knows, MD      . cholecalciferol (VITAMIN D) tablet 2,000 Units  2,000 Units Oral q morning -  10a Hessie Knows, MD      . docusate sodium (COLACE) capsule 100 mg  100 mg Oral BID Hessie Knows, MD      . Derrill Memo ON 02/21/2016] enoxaparin (LOVENOX) injection 40 mg  40 mg Subcutaneous Q24H Hessie Knows, MD      . fentaNYL (SUBLIMAZE) 100 MCG/2ML injection           . ipratropium-albuterol (DUONEB) 0.5-2.5 (3) MG/3ML nebulizer solution 3 mL  3 mL Nebulization Q6H Hessie Knows, MD      . isosorbide dinitrate (ISORDIL) tablet 30 mg  30 mg Oral BID Hessie Knows, MD      . lisinopril (PRINIVIL,ZESTRIL) tablet 5 mg  5 mg Oral Daily Hessie Knows, MD      . magnesium citrate solution 1 Bottle  1 Bottle Oral Once PRN Hessie Knows, MD      . magnesium hydroxide (MILK OF MAGNESIA) suspension 30 mL  30 mL Oral Daily PRN Hessie Knows, MD      . menthol-cetylpyridinium (CEPACOL) lozenge 3 mg  1 lozenge Oral PRN  Hessie Knows, MD       Or  . phenol (CHLORASEPTIC) mouth spray 1 spray  1 spray Mouth/Throat PRN Hessie Knows, MD      . methocarbamol (ROBAXIN) tablet 500 mg  500 mg Oral Q6H PRN Hessie Knows, MD       Or  . methocarbamol (ROBAXIN) 500 mg in dextrose 5 % 50 mL IVPB  500 mg Intravenous Q6H PRN Hessie Knows, MD      . metoCLOPramide (REGLAN) tablet 5-10 mg  5-10 mg Oral Q8H PRN Hessie Knows, MD       Or  . metoCLOPramide (REGLAN) injection 5-10 mg  5-10 mg Intravenous Q8H PRN Hessie Knows, MD      . metoprolol succinate (TOPROL-XL) 24 hr tablet 75 mg  75 mg Oral Daily Hessie Knows, MD      . morphine 2 MG/ML injection 2 mg  2 mg Intravenous Q1H PRN Hessie Knows, MD   2 mg at 02/20/16 1149  . ondansetron (ZOFRAN) tablet 4 mg  4 mg Oral Q6H PRN Hessie Knows, MD       Or  . ondansetron [facility]) injection 4 mg  4 mg Intravenous Q6H PRN Hessie Knows, MD      . oxyCODONE (Oxy IR/ROXICODONE) immediate release tablet 5-10 mg  5-10 mg Oral Q3H PRN Hessie Knows, MD   5 mg at 02/20/16 1306  . pantoprazole (PROTONIX) EC tablet 40 mg  40 mg Oral Daily Hessie Knows, MD      . simvastatin (ZOCOR) tablet 40 mg  40 mg Oral Daily Hessie Knows, MD      . vitamin B-12 (CYANOCOBALAMIN) tablet 2,000 mcg  2,000 mcg Oral Daily Hessie Knows, MD      . zolpidem [facility]) tablet 5 mg  5 mg Oral QHS PRN Hessie Knows, MD         Discharge Medications: Please see discharge summary for a list of discharge medications.  Relevant Imaging Results:  Relevant Lab Results:   Additional Information  (SSN: [id number])  Loralyn Freshwater, LCSW

## 2016-02-20 NOTE — H&P (Signed)
Reviewed paper H+P, will be scanned into chart. No changes noted.  

## 2016-02-20 NOTE — Consult Note (Signed)
[facility] [place] [facility]   PATIENT NAME: Ashley [name]    MR#:  [id number]  DATE OF BIRTH:  22-Oct-1931  DATE OF ADMISSION:  02/20/2016  PRIMARY CARE PHYSICIAN: Jaclyn Shaggy, MD   REQUESTING/REFERRING PHYSICIAN: dr Rosita Kea CHIEF COMPLAINT:  Chest pain today   HISTORY OF PRESENT ILLNESS:  Ashley [name]  is a 82 y.o. female with a known history of CAD status post stenting COPD, anxiety, GERD, hypertension is status post right total hip arthroplasty. Patient was having dinner in the evening. She felt some indigestion thereafter started having chest pain and felt she had to pass gas. Internal medicine was consulted for chest pain. During my evaluation patient is feeling back to baseline. She felt after taking a big burp her chest pain went away. She was able to finish her dinner. Aspirin 81 mg was given. EKG shows no acute ST elevation or depression, sinus rhythm. Her cardiac markers are pending.  PAST MEDICAL HISTORY:   Past Medical History  Diagnosis Date  . Coronary artery disease   . Hypertension   . Anemia   . Shortness of breath dyspnea   . COPD (chronic obstructive pulmonary disease) (HCC)   . Myocardial infarction (HCC)   . Peripheral vascular disease (HCC)   . Anxiety   . GERD (gastroesophageal reflux disease)   . Arthritis   . Edema     feet/ankles   wears support hose  . GI (gastrointestinal bleed)     history    PAST SURGICAL HISTOIRY:   Past Surgical History  Procedure Laterality Date  . Abdominal hysterectomy    . Coronary angioplasty      stents  . Ablation      right leg less than 1 year ago  . Fracture surgery      right wrist  . Cataract extraction w/ intraocular lens  implant, bilateral Bilateral   . Eye surgery    . Total hip arthroplasty Right 02/20/2016    Procedure: TOTAL HIP ARTHROPLASTY ANTERIOR APPROACH;  Surgeon: Kennedy Bucker, MD;  Location: [facility] ORS;  Service: Orthopedics;  Laterality: Right;    SOCIAL  HISTORY:   Social History  Substance Use Topics  . Smoking status: Former Smoker    Quit date: 02/11/1997  . Smokeless tobacco: Never Used  . Alcohol Use: No    FAMILY HISTORY:  History reviewed. No pertinent family history.  DRUG ALLERGIES:  No Known Allergies  REVIEW OF SYSTEMS:   Review of Systems  Constitutional: Negative for fever, chills and weight loss.  HENT: Negative for ear discharge, ear pain and nosebleeds.   Eyes: Negative for blurred vision, pain and discharge.  Respiratory: Negative for sputum production, shortness of breath, wheezing and stridor.   Cardiovascular: Positive for chest pain. Negative for palpitations, orthopnea and PND.  Gastrointestinal: Positive for heartburn. Negative for nausea, vomiting, abdominal pain and diarrhea.  Genitourinary: Negative for urgency and frequency.  Musculoskeletal: Negative for back pain and joint pain.  Neurological: Positive for weakness. Negative for sensory change, speech change and focal weakness.  Psychiatric/Behavioral: Negative for depression and hallucinations. The patient is not nervous/anxious.   All other systems reviewed and are negative.  MEDICATIONS AT HOME:   Prior to Admission medications   Medication Sig Start Date End Date Taking? Authorizing Provider  ALPRAZolam (XANAX) 0.25 MG tablet Take 0.25 mg by mouth 2 (two) times daily. As needed usually just at bedtime   Yes Historical Provider, MD  Cholecalciferol (VITAMIN D-3 PO)  Take by mouth.   Yes Historical Provider, MD  Cyanocobalamin (VITAMIN B-12 CR PO) Take by mouth.   Yes Historical Provider, MD  ibuprofen (ADVIL,MOTRIN) 200 MG tablet Take 200 mg by mouth 3 (three) times daily.   Yes Historical Provider, MD  Ipratropium-Albuterol (COMBIVENT RESPIMAT) 20-100 MCG/ACT AERS respimat Inhale 1 puff into the lungs every 6 (six) hours.   Yes Historical Provider, MD  isosorbide dinitrate (ISORDIL) 30 MG tablet Take 30 mg by mouth 2 (two) times daily.   Yes  Historical Provider, MD  lisinopril (PRINIVIL,ZESTRIL) 10 MG tablet Take 5 mg by mouth daily.   Yes Historical Provider, MD  metoprolol succinate (TOPROL-XL) 50 MG 24 hr tablet Take 75 mg by mouth daily. Take with or immediately following a meal.   Yes Historical Provider, MD  omeprazole (PRILOSEC) 20 MG capsule Take 20 mg by mouth daily.   Yes Historical Provider, MD  simvastatin (ZOCOR) 40 MG tablet Take 40 mg by mouth daily.   Yes Historical Provider, MD  traMADol (ULTRAM) 50 MG tablet Take 50 mg by mouth every 6 (six) hours as needed.   Yes Historical Provider, MD  amLODipine (NORVASC) 10 MG tablet Take 10 mg by mouth daily.    Historical Provider, MD      VITAL SIGNS:  Blood pressure 152/73, pulse 77, temperature 98.4 F (36.9 C), temperature source Oral, resp. rate 18, height 4\' 11"  (1.499 m), weight 83.008 kg (183 lb), SpO2 97 %.  PHYSICAL EXAMINATION:  GENERAL:  82 y.o.-year-old patient lying in the bed with no acute distress.  EYES: Pupils equal, round, reactive to light and accommodation. No scleral icterus. Extraocular muscles intact.  HEENT: Head atraumatic, normocephalic. Oropharynx and nasopharynx clear.  NECK:  Supple, no jugular venous distention. No thyroid enlargement, no tenderness.  LUNGS: Normal breath sounds bilaterally, no wheezing, rales,rhonchi or crepitation. No use of accessory muscles of respiration.  CARDIOVASCULAR: S1, S2 normal. No murmurs, rubs, or gallops.  ABDOMEN: Soft, nontender, nondistended. Bowel sounds present. No organomegaly or mass.  EXTREMITIES: No pedal edema, cyanosis, or clubbing.  Right hip dressing+ NEUROLOGIC: Cranial nerves II through XII are intact. Muscle strength 5/5 in all extremities. Sensation intact. Gait not checked.  PSYCHIATRIC: The patient is alert and oriented x 3.  SKIN: No obvious rash, lesion, or ulcer.   LABORATORY PANEL:   CBC  Recent Labs Lab 02/20/16 1321  WBC 12.8*  HGB 12.2  HCT 38.4  PLT 160    ------------------------------------------------------------------------------------------------------------------  Chemistries   Recent Labs Lab 02/20/16 1321  CREATININE 0.66   ------------------------------------------------------------------------------------------------------------------  Cardiac Enzymes No results for input(s): TROPONINI in the last 168 hours. ------------------------------------------------------------------------------------------------------------------  RADIOLOGY:  Dg Hip Operative Unilat W Or W/o Pelvis Right  02/20/2016  CLINICAL DATA:  Right hip replacement. EXAM: OPERATIVE RIGHT HIP (WITH PELVIS IF PERFORMED) 3 VIEWS TECHNIQUE: Fluoroscopic spot image(s) were submitted for interpretation post-operatively. COMPARISON:  No recent prior. FINDINGS: Total right hip replacement with good anatomic alignment. Visualized hardware intact. IMPRESSION: Total right hip replacement with good anatomic alignment. Electronically Signed   By: Maisie Fus  Register   On: 02/20/2016 09:28   Dg Hip Unilat W Or W/o Pelvis 2-3 Views Right  02/20/2016  CLINICAL DATA:  Status post right hip surgery EXAM: DG HIP (WITH OR WITHOUT PELVIS) 2-3V RIGHT COMPARISON:  None. FINDINGS: A right hip replacement is noted in satisfactory position. Surgical drains are seen. No soft tissue abnormality is noted. IMPRESSION: Status post right hip replacement Electronically Signed   By: Loraine Leriche  Lukens M.D.   On: 02/20/2016 10:42    EKG:   Normal sinus rhythm. No acute ST elevation or depression.   IMPRESSION AND PLAN:   Ashley [name]  is a 82 y.o. female with a known history of CAD status post stenting COPD, anxiety, GERD, hypertension is status post right total hip arthroplasty. Patient was having dinner in the evening. She felt some indigestion thereafter started having chest pain and felt she had to pass gas.   1. Chest pain appears atypical. Symptoms resolved Likely due to GERD Cardiac markers  pending Continue aspirin 81 mg, Imdur,beta blockers, lisinopril and amlodipine Consider a cardiology consultation if needed. Patient sees Dr. Lady Gary  2. Status post right hip total arthroplasty Management per ortho  3 hyperlipidemia on simvastatin  4 hypertension continue metoprolol, lisinopril, amlodipine  5 GERD continue Protonix. When necessary Maalox.  Thank you for the consult will follow with you.  All the records are reviewed and case discussed with Consulting provider. Management plans discussed with the patient, family and they are in agreement.  CODE STATUS: Full  TOTAL TIME TAKING CARE OF THIS PATIENT: 45 minutes.    Breckon Reeves M.D on 02/20/2016 at 8:04 PM  Between 7am to 6pm - Pager - [phone]  After 6pm go to [email or web address] - password [email or web address] [facility]  [facility]  Office  [phone]  CC: Primary care Physician: Jaclyn Shaggy, MD

## 2016-02-20 NOTE — Evaluation (Signed)
Physical Therapy Evaluation Patient Details Name: Ashley [name] MRN: [id number] DOB: September 11, 1931 Today's Date: 02/20/2016   History of Present Illness  Pt is an 82 y.o. F s/p R THA on 4/27. Prior to admission pt independent with all ADLs, lived alone.   Clinical Impression  Pt is a pleasant 83 y.o. F POD #0 R THA. Prior to admission pt lived alone and performed all ADLs independently except for driving. Pt ambulated with SPC. Pt awake and oriented during eval, however seemed lethargic d/t medication. Pt demonstrated fair UE and L LE strength and poor R LE strength. Pt able to perform bed mobility with 2+ max A. Pt able to transfer from EOB with 2+ max A and RW. Pt able to ambulate 3 ft with RW and 2+ max A. Pt on 2L O2 at start of eval, O2 sat at 96%. Pt taken off O2 for ambulation, sat dropped to 89% and did not rise soon after ambulation. Pt placed back on 2L. Pt performed supine there-ex on B LE. Pt demonstrates deficits in strength, ROM, balance and mobility. Pt would benefit from further skilled PT to address deficits; recommend pt sent to SNF after discharge from acute hospitalization.     Follow Up Recommendations SNF    Equipment Recommendations  Rolling walker with 5" wheels    Recommendations for Other Services       Precautions / Restrictions Precautions Precautions: Fall;Anterior Hip Restrictions Weight Bearing Restrictions: Yes RLE Weight Bearing: Weight bearing as tolerated      Mobility  Bed Mobility Overal bed mobility: Needs Assistance;+2 for physical assistance Bed Mobility: Supine to Sit     Supine to sit: +2 for physical assistance;Max assist     General bed mobility comments: Pt able to perform bed mobility with 2+ max assist. Pt required assist with moving B LE and to upright trunk. Pt provided heavy cues regarding hand placement. Pt required assist to scoot to EOB.   Transfers Overall transfer level: Needs assistance Equipment used: Rolling walker (2  wheeled) Transfers: Sit to/from Stand Sit to Stand: +2 physical assistance;Max assist         General transfer comment: Pt required 2+ max assist and RW to stand from EOB. Pt required heavy cues regarding hand placement during transfer. Pt required increased time to upright posture and weight-bear on R LE.   Ambulation/Gait Ambulation/Gait assistance: Max assist;+2 physical assistance Ambulation Distance (Feet): 3 Feet Assistive device: Rolling walker (2 wheeled) Gait Pattern/deviations: Step-to pattern;Shuffle Gait velocity: slow   General Gait Details: Pt able to ambulate approx 3 ft with RW and 2+ max assist. Pt demonstrated step-to short steps t/o gait. Pt provided heavy verbal and tactile cues for walker and foot placement and for weight shift to move R LE. Pt required assist with moving walker and R LE. Pt appeared lethargic during ambulation and kept eyes closed, however was able to follow commands. Pt expressed feeling sick after ambulation.   Stairs            Wheelchair Mobility    Modified Rankin (Stroke Patients Only)       Balance Overall balance assessment: Needs assistance Sitting-balance support: Bilateral upper extremity supported;Feet supported Sitting balance-Leahy Scale: Good Sitting balance - Comments: Pt able to maintain sitting balance on EOB with B UE and LE supported for approx 3 minutes.    Standing balance support: Bilateral upper extremity supported Standing balance-Leahy Scale: Fair Standing balance comment: Pt able to maintain standing balance with RW and  min assist. Pt required increased time to stand upright and maintain WB through B LE.                              Pertinent Vitals/Pain Pain Assessment: 0-10 Pain Score: 8  Pain Location: R hip/leg Pain Descriptors / Indicators: Operative site guarding Pain Intervention(s): Premedicated before session    Home Living Family/patient expects to be discharged to:: Private  residence Living Arrangements: Alone Available Help at Discharge: Family Type of Home: House Home Access: Stairs to enter Entrance Stairs-Rails: Can reach both Entrance Stairs-Number of Steps: 4 Home Layout: One level Home Equipment: [place] - single point Additional Comments: Pt lives alone. Has 2 sons in the area that are available to assist and drive her.     Prior Function Level of Independence: Independent with assistive device(s)         Comments: Pt performs all ADLs independently except for driving. Pt ambulates with SPC.      Hand Dominance        Extremity/Trunk Assessment   Upper Extremity Assessment: RUE deficits/detail;LUE deficits/detail RUE Deficits / Details: R UE grossly 3+/5 strength      LUE Deficits / Details: L UE grossly 3+/5 strength   Lower Extremity Assessment: RLE deficits/detail;LLE deficits/detail RLE Deficits / Details: R LE grossly 3-/5 strength, assessment limited by pts pain.  LLE Deficits / Details: L LE grossly 4/5 strength     Communication   Communication: No difficulties  Cognition Arousal/Alertness: Lethargic Behavior During Therapy: WFL for tasks assessed/performed Overall Cognitive Status: Within Functional Limits for tasks assessed                      General Comments      Exercises Other Exercises Other Exercises: Pt performed supine ther-ex including SLR, quad sets and ankle pumps on B LE and heel slides on L LE. Pt required max assist with SLR on R LE. All ther-ex performed x10 reps.       Assessment/Plan    PT Assessment Patient needs continued PT services  PT Diagnosis Difficulty walking;Abnormality of gait;Generalized weakness;Acute pain   PT Problem List Decreased strength;Decreased range of motion;Decreased balance;Decreased mobility;Decreased knowledge of use of DME;Pain  PT Treatment Interventions DME instruction;Gait training;Stair training;Therapeutic activities;Therapeutic exercise   PT Goals  (Current goals can be found in the Care Plan section) Acute Rehab PT Goals Patient Stated Goal: to return to PLOF PT Goal Formulation: With patient Time For Goal Achievement: 03/05/16 Potential to Achieve Goals: Fair    Frequency BID   Barriers to discharge Decreased caregiver support      Co-evaluation               End of Session Equipment Utilized During Treatment: Gait belt;Oxygen Activity Tolerance: Patient limited by fatigue Patient left: in chair;with call bell/phone within reach;with chair alarm set;with family/visitor present Nurse Communication: Mobility status         Time: [id number] PT Time Calculation (min) (ACUTE ONLY): 42 min   Charges:         PT G Codes:        Sherral Hammers 02-Mar-2016, 5:01 PM M. Barnett Abu, SPT

## 2016-02-20 NOTE — Anesthesia Procedure Notes (Addendum)
Spinal Patient location during procedure: OR Start time: 02/20/2016 7:25 AM End time: 02/20/2016 7:29 AM Staffing Anesthesiologist: Gunnar Bulla Resident/CRNA: Johnna Acosta Performed by: resident/CRNA  Preanesthetic Checklist Completed: patient identified, site marked, surgical consent, pre-op evaluation, timeout performed, IV checked, risks and benefits discussed and monitors and equipment checked Spinal Block Patient position: sitting Prep: Betadine Patient monitoring: continuous pulse ox and blood pressure Approach: midline Location: L3-4 Injection technique: single-shot Needle Needle type: [place]acre and Introducer  Needle gauge: 25 G Needle length: 9 cm Additional Notes Negative paresthesia. Negative blood return. Positive free-flowing CSF. Expiration date of kit checked and confirmed. Patient tolerated procedure well, without complications.    Procedure Name: LMA Insertion Date/Time: 02/20/2016 8:11 AM Performed by: Johnna Acosta Pre-anesthesia Checklist: Suction available and Patient being monitored Patient Re-evaluated:Patient Re-evaluated prior to inductionOxygen Delivery Method: Circle system utilized Preoxygenation: Pre-oxygenation with 100% oxygen Intubation Type: IV induction LMA: LMA inserted LMA Size: 3.5 Tube type: Oral Number of attempts: 1 Placement Confirmation: positive ETCO2 and breath sounds checked- equal and bilateral Tube secured with: Tape Dental Injury: Teeth and Oropharynx as per pre-operative assessment

## 2016-02-20 NOTE — Anesthesia Preprocedure Evaluation (Signed)
Anesthesia Evaluation  Patient identified by MRN, date of birth, ID band Patient awake    Reviewed: Allergy & Precautions, NPO status , Patient's Chart, lab work & pertinent test results, reviewed documented beta blocker date and time   Airway Mallampati: III  TM Distance: >3 FB     Dental  (+) Chipped   Pulmonary shortness of breath, COPD, former smoker,           Cardiovascular hypertension, Pt. on medications and Pt. on home beta blockers + CAD, + Past MI and + Peripheral Vascular Disease       Neuro/Psych Anxiety    GI/Hepatic GERD  Controlled,  Endo/Other    Renal/GU      Musculoskeletal  (+) Arthritis ,   Abdominal   Peds  Hematology  (+) anemia ,   Anesthesia Other Findings Obese.   Reproductive/Obstetrics                             Anesthesia Physical Anesthesia Plan  ASA: III  Anesthesia Plan: Spinal   Post-op Pain Management:    Induction:   Airway Management Planned:   Additional Equipment:   Intra-op Plan:   Post-operative Plan:   Informed Consent: I have reviewed the patients History and Physical, chart, labs and discussed the procedure including the risks, benefits and alternatives for the proposed anesthesia with the patient or authorized representative who has indicated his/her understanding and acceptance.     Plan Discussed with: CRNA  Anesthesia Plan Comments:         Anesthesia Quick Evaluation

## 2016-02-20 NOTE — Transfer of Care (Signed)
Immediate Anesthesia Transfer of Care Note  Patient: Ashley [name]  Procedure(s) Performed: Procedure(s): TOTAL HIP ARTHROPLASTY ANTERIOR APPROACH (Right)  Patient Location: PACU  Anesthesia Type:General  Level of Consciousness: sedated  Airway & Oxygen Therapy: Patient Spontanous Breathing and Patient connected to face mask oxygen  Post-op Assessment: Report given to RN and Post -op Vital signs reviewed and stable  Post vital signs: Reviewed and stable  Last Vitals:  Filed Vitals:   02/20/16 0944 02/20/16 0945  BP: 128/67 128/67  Pulse: 69 68  Temp: 36.5 C   Resp: 13 15    Last Pain:  Filed Vitals:   02/20/16 0946  PainSc: 5          Complications: No apparent anesthesia complications

## 2016-02-20 NOTE — Clinical Social Work Note (Signed)
Clinical Social Work Assessment  Patient Details  Name: Ashley [name] MRN: [id number] Date of Birth: 12/29/1930  Date of referral:  02/20/16               Reason for consult:  Facility Placement                Permission sought to share information with:  Chartered certified accountant granted to share information::  Yes, Verbal Permission Granted  Name::      [place]::   [place]   Relationship::     Contact Information:     Housing/Transportation Living arrangements for the past 2 months:  [place] Information:  Patient, Adult Children Patient Interpreter Needed:  None Criminal Activity/Legal Involvement Pertinent to Current Situation/Hospitalization:  No - Comment as needed Significant Relationships:  Adult Children, Other Family Members Lives with:  Self Do you feel safe going back to the place where you live?  Yes Need for family participation in patient care:  Yes (Comment)  Care giving concerns:  Patient lives in [place] alone.    Social Worker assessment / plan:  Holiday representative (CSW) received SNF consult. PT is pending. CSW met with patient and her 3 sons, daughter in law and granddaughter were at bedside. CSW introduced self and explained role of CSW department. Patient was alert and oriented and sitting up in the bed. Patient's son Ashley [name] [phone] is HPOA. Per patient she lives alone in [place] and 2 of her sons live in [place]. CSW explained that PT will work with patient tomorrow and recommend home health or SNF. Patient reported that she prefers to go to rehab. CSW explained that patient will need a 3 night qualfying inpatient stay in order for Medicare to pay for rehab. Patient and family verbalized their understanding. Patient is agreeable to SNF search in [facility] and prefers [facility] SNF list provided to family.   FL2 complete and faxed out. CSW will continue to follow  and assist as needed.   Employment status:  Retired Forensic scientist:  Medicare PT Recommendations:  Not assessed at this time [place] / Referral to community resources:  [place]  Patient/Family's Response to care:  Patient and family are agreeable to [facility] and prefer [facility]   Patient/Family's Understanding of and Emotional Response to Diagnosis, Current Treatment, and Prognosis:  Patient and family were pleasant and thanked CSW for visit.   Emotional Assessment Appearance:  Appears stated age Attitude/Demeanor/Rapport:    Affect (typically observed):  Accepting, Adaptable, Pleasant Orientation:  Oriented to Self, Oriented to Place, Oriented to  Time, Oriented to Situation Alcohol / Substance use:  Not Applicable Psych involvement (Current and /or in the community):  No (Comment)  Discharge Needs  Concerns to be addressed:  Discharge Planning Concerns Readmission within the last 30 days:  No Current discharge risk:  Dependent with Mobility Barriers to Discharge:  Continued Medical Work up   Loralyn Freshwater, LCSW 02/20/2016, 3:14 PM

## 2016-02-21 LAB — TROPONIN I
Troponin I: <0.03 ng/mL
Troponin I: <0.03 ng/mL

## 2016-02-21 MED ORDER — TRAMADOL HCL 50 MG PO TABS: 50.0000 mg | ORAL_TABLET | Freq: Four times a day (QID) | ORAL | Status: DC | PRN

## 2016-02-21 MED ORDER — IPRATROPIUM-ALBUTEROL 0.5-2.5 (3) MG/3ML IN SOLN
3.0000 mL | Freq: Two times a day (BID) | RESPIRATORY_TRACT | Status: DC
  Administered 2016-02-22 – 2016-02-23 (×3): 3 mL via RESPIRATORY_TRACT
  Filled 2016-02-21 (×4): qty 3

## 2016-02-21 MED ORDER — PANTOPRAZOLE SODIUM 40 MG PO TBEC
40.0000 mg | DELAYED_RELEASE_TABLET | Freq: Every day | ORAL | Status: DC
  Administered 2016-02-22 – 2016-02-23 (×2): 40 mg via ORAL
  Filled 2016-02-21 (×3): qty 1

## 2016-02-21 MED ORDER — ENOXAPARIN SODIUM 40 MG/0.4ML ~~LOC~~ SOLN: 40.0000 mg | SUBCUTANEOUS | Status: DC

## 2016-02-21 MED ORDER — OXYCODONE HCL 5 MG PO TABS: 5.0000 mg | ORAL_TABLET | ORAL | Status: DC | PRN

## 2016-02-21 NOTE — Discharge Instructions (Signed)

## 2016-02-21 NOTE — Progress Notes (Signed)
Physical Therapy Treatment Patient Details Name: Ashley [name] MRN: [id number] DOB: 27-May-1931 Today's Date: 02/21/2016    History of Present Illness Pt is an 82 y.o. F s/p R THA on 4/27. Prior to admission pt independent with all ADLs, IADLs except driving.  Son brings in her meals at home.      PT Comments    Pt is slowly making progress towards goals. Pt able to perform bed mobility with 2+ max assist and use of trapeze. Pt able to transfer from recliner with 2+ mod assist. Pt able to ambulate approx 7 ft in room using 2+ max assist. Pt performed supine there-ex with max to no assist. Pt fatigued and SOB after ambulation. O2 sat at 89% on room air after ambulation. Pt put back on 2L. Pt demonstrates deficits in strength, ROM, mobility and endurance. Pt would benefit from further skilled PT to address deficits; recommend pt sent to SNF after discharge from acute hospitalization.   Follow Up Recommendations  SNF     Equipment Recommendations  Rolling walker with 5" wheels    Recommendations for Other Services       Precautions / Restrictions Precautions Precautions: Fall;Anterior Hip Restrictions Weight Bearing Restrictions: Yes RLE Weight Bearing: Weight bearing as tolerated    Mobility  Bed Mobility Overal bed mobility: Needs Assistance Bed Mobility: Sit to Supine       Sit to supine: +2 for physical assistance;Max assist   General bed mobility comments: Pt able to perform bed mobility with 2+ max assist. Pt required assist with moving B LE and lowering trunk to bed. Pt able to use trapeze to assist with moving to head of bed. Pt required heavy cues regarding hand and L LE placement during mobility.   Transfers Overall transfer level: Needs assistance Equipment used: Rolling walker (2 wheeled) Transfers: Sit to/from Stand Sit to Stand: +2 physical assistance;Mod assist         General transfer comment: Pt required 2+ mod assist and use of RW to stand from  recliner. Pt required heavy cues regarding hand and foot placement. Pt required increased time to stand and maintain upright posture during transfer.   Ambulation/Gait Ambulation/Gait assistance: +2 physical assistance;Max assist Ambulation Distance (Feet): 7 Feet Assistive device: Rolling walker (2 wheeled) Gait Pattern/deviations: Step-to pattern;Shuffle Gait velocity: slow   General Gait Details: Pt able to ambulate approx 7 ft in room with RW and 2+ max assist. Pt requires assist with moving R LE and weight shifting. Pt demonstrates very slow step-to, shuffle gait. Pt provided heavy cues and reinforcement t/o ambulation regarding proper foot and walker placement.  Pt required assist with moving walker.    Stairs            Wheelchair Mobility    Modified Rankin (Stroke Patients Only)       Balance                                    Cognition Arousal/Alertness: Awake/alert Behavior During Therapy: WFL for tasks assessed/performed Overall Cognitive Status: Within Functional Limits for tasks assessed                      Exercises Other Exercises Other Exercises: Pt performed supine ther-ex on R LE including ankle pumps and quad sets with no assist, SLR with max assist. Pt required heavy cueing regarding proper form of exercises. All ther-ex performed  x12 reps.     General Comments        Pertinent Vitals/Pain Pain Assessment: 0-10 Pain Score: 3  Pain Location: R hip/leg Pain Descriptors / Indicators: Operative site guarding Pain Intervention(s): Limited activity within patient's tolerance;Premedicated before session    Home Living                      Prior Function            PT Goals (current goals can now be found in the care plan section) Acute Rehab PT Goals Patient Stated Goal: to return to PLOF PT Goal Formulation: With patient Time For Goal Achievement: 03/05/16 Potential to Achieve Goals: Fair Progress towards PT  goals: Progressing toward goals    Frequency  BID    PT Plan Current plan remains appropriate    Co-evaluation             End of Session Equipment Utilized During Treatment: Gait belt Activity Tolerance: Patient limited by fatigue;Patient limited by pain Patient left: in bed;with call bell/phone within reach;with bed alarm set;with family/visitor present     Time: [id number] PT Time Calculation (min) (ACUTE ONLY): 39 min  Charges:                       G Codes:      Sherral Hammers 2016/03/12, 2:15 PM M. Barnett Abu, SPT

## 2016-02-21 NOTE — Progress Notes (Signed)
Foley d/c;d at 0615 with 100cc output

## 2016-02-21 NOTE — Progress Notes (Signed)
Physical Therapy Treatment Patient Details Name: Ashley [name] MRN: [id number] DOB: 1931-05-29 Today's Date: 02/21/2016    History of Present Illness Pt is an 82 y.o. F s/p R THA on 4/27. Prior to admission pt independent with all ADLs, IADLs except driving.  Son brings in her meals at home.      PT Comments    Pt is making progress towards goals, however limited by pain and fatigue. Pt stated pain level lower today than yesterday. Pt able to perform transfer using 2+ mod assist and RW. Pt ambulated approx 5 ft using 2+ max assist and RW. Ambulation limited by pts pain and fatigue. Pt performed seated there-ex with mod to no assist. Pt on 2L O2 at start of treatment, O2 sat at 93%. Pt on room air for there-ex and ambulation, O2 dropped to 90% and increased to 93% after sitting for 1 minute. Pt demonstrates deficits in strength, ROM, mobility and endurance. Pt would benefit from further skilled PT to address deficits; recommend pt sent to SNF after discharge from acute hospitalization.   Follow Up Recommendations  SNF     Equipment Recommendations  Rolling walker with 5" wheels    Recommendations for Other Services       Precautions / Restrictions Precautions Precautions: Fall;Anterior Hip Restrictions Weight Bearing Restrictions: Yes RLE Weight Bearing: Weight bearing as tolerated    Mobility  Bed Mobility Overal bed mobility: Needs Assistance Bed Mobility: Supine to Sit     Supine to sit: Max assist     General bed mobility comments: Pt in chair at start of treatment  Transfers Overall transfer level: Needs assistance Equipment used: Rolling walker (2 wheeled) Transfers: Sit to/from Stand Sit to Stand: +2 physical assistance;Mod assist         General transfer comment: Pt required 2+ mod assist and RW to stand from recliner. Pt required heavy cues regarding hand and foot placement. Pt required increased time to stand upright and maintain WB on R LE.    Ambulation/Gait Ambulation/Gait assistance: +2 physical assistance;Max assist Ambulation Distance (Feet): 5 Feet Assistive device: Rolling walker (2 wheeled) Gait Pattern/deviations: Step-to pattern;Shuffle Gait velocity: slow   General Gait Details: Pt able to ambulate approx 5 ft in room with RW and 2+ max assist. Pt demonstrated slow step-to shuffle gait. Pt required assist with advancing R LE forward. Pt provided heavy cues regarding hand/foot placement and use of weight shift to move B LE. Pt required assist with moving walker. Ambulation limited by pts fatigue and pain. Pt became SOB w/ ambulation, however O2 at 93% on room air.    Stairs            Wheelchair Mobility    Modified Rankin (Stroke Patients Only)       Balance                                    Cognition Arousal/Alertness: Awake/alert Behavior During Therapy: WFL for tasks assessed/performed Overall Cognitive Status: Within Functional Limits for tasks assessed                      Exercises Other Exercises Other Exercises: Pt performed seated ther-ex on B LE including ankle pumps, glute sets and quad sets with no assist, SLR and heel slides on R LE with mod assist. Pt demonstrated difficulty bending knee for heel slides. Pt provided heavy cues regarding proper  form of ther-ex. All ther-ex performed x12 reps.     General Comments        Pertinent Vitals/Pain Pain Assessment: 0-10 Pain Score: 8  Pain Location: R hip/leg Pain Descriptors / Indicators: Operative site guarding Pain Intervention(s): Limited activity within patient's tolerance    Home Living Family/patient expects to be discharged to:: Private residence Living Arrangements: Alone Available Help at Discharge: Family Type of Home: House Home Access: Stairs to enter Entrance Stairs-Rails: Can reach both Home Layout: One level Home Equipment: Cane - single point;Walker - 2 wheels;Grab bars -  tub/shower Additional Comments: Pt lives alone. Has 2 sons in the area that are available to assist and drive her.     Prior Function Level of Independence: Independent with assistive device(s)      Comments: Pt performs all ADLs independently except for driving. Pt ambulates with SPC.    PT Goals (current goals can now be found in the care plan section) Acute Rehab PT Goals Patient Stated Goal: to return to PLOF PT Goal Formulation: With patient Time For Goal Achievement: 03/05/16 Potential to Achieve Goals: Fair Progress towards PT goals: Progressing toward goals    Frequency  BID    PT Plan Current plan remains appropriate    Co-evaluation             End of Session Equipment Utilized During Treatment: Gait belt Activity Tolerance: Patient limited by fatigue;Patient limited by pain Patient left: in chair;with call bell/phone within reach;with family/visitor present     Time: [id number] PT Time Calculation (min) (ACUTE ONLY): 29 min  Charges:                       G Codes:      Sherral Hammers 03/18/16, 11:48 AM M. Barnett Abu, SPT

## 2016-02-21 NOTE — Evaluation (Addendum)
Occupational Therapy Evaluation Patient Details Name: Ashley [name] MRN: [id number] DOB: 1931-05-07 Today's Date: 02/21/2016    History of Present Illness Pt is an 82 y.o. F s/p R THA on 4/27. Prior to admission pt independent with all ADLs, IADLs except driving.  Son brings in her meals at home.     Clinical Impression   Patient seen for OT evaluation this date, patient is s/p right THA with anterior precautions and presents with decreased muscle weakness, decreased ability to perform transfers, functional mobility and decreased ability to perform self care tasks. She lives alone and will likely require short term rehab prior to returning home alone.  She has a supportive family which can help with meals once she is home.  She would benefit from skilled OT to maximize safety and independence with self care tasks following hip surgery.      Follow Up Recommendations  SNF    Equipment Recommendations       Recommendations for Other Services       Precautions / Restrictions Precautions Precautions: Fall;Anterior Hip Restrictions Weight Bearing Restrictions: Yes RLE Weight Bearing: Weight bearing as tolerated      Mobility Bed Mobility Overal bed mobility: Needs Assistance Bed Mobility: Supine to Sit     Supine to sit: Max assist        Transfers Overall transfer level: Needs assistance Equipment used: Rolling walker (2 wheeled) Transfers: Sit to/from Stand Sit to Stand: Min assist         General transfer comment: Patient improved with transfers this date versus yesterday.  Moderate to maximal cues and assist from therapist.     Balance                                            ADL Overall ADL's : Needs assistance/impaired Eating/Feeding: Set up   Grooming: Set up   Upper Body Bathing: Set up;Minimal assitance   Lower Body Bathing: Set up;Maximal assistance   Upper Body Dressing : Set up;Minimal assistance   Lower Body Dressing:  Set up;Maximal assistance   Toilet Transfer: Set up;Moderate assistance   Toileting- Clothing Manipulation and Hygiene: Set up;Moderate assistance       Functional mobility during ADLs: Minimal assistance;Cueing for sequencing;Rolling walker General ADL Comments: Moderate to maximal cues for walker advancement, functional mobility and transfers.   Patient required max assist for lower body dressing this date and will require instruction adaptive equipment to increase independence in self care tasks.   Vision     Perception     Praxis      Pertinent Vitals/Pain Pain Assessment: 0-10 Pain Score: 3  Pain Location: right hip Pain Descriptors / Indicators: Operative site guarding;Aching Pain Intervention(s): RN gave pain meds during session;Monitored during session;Limited activity within patient's tolerance;Repositioned     Hand Dominance Right   Extremity/Trunk Assessment Upper Extremity Assessment Upper Extremity Assessment: Generalized weakness;Overall WFL for tasks assessed RUE Deficits / Details: R UE grossly 3+/5 strength  LUE Deficits / Details: L UE grossly 3+/5 strength   Lower Extremity Assessment Lower Extremity Assessment: Defer to PT evaluation       Communication Communication Communication: No difficulties   Cognition Arousal/Alertness: Lethargic Behavior During Therapy: WFL for tasks assessed/performed Overall Cognitive Status: Within Functional Limits for tasks assessed  General Comments       Exercises       Shoulder Instructions      Home Living Family/patient expects to be discharged to:: Private residence Living Arrangements: Alone Available Help at Discharge: Family Type of Home: House Home Access: Stairs to enter Secretary/administrator of Steps: 4 Entrance Stairs-Rails: Can reach both Home Layout: One level     Bathroom Shower/Tub: Walk-in Pensions consultant: Handicapped height Bathroom  Accessibility: Yes   Home Equipment: Cane - single point;Walker - 2 wheels;Grab bars - tub/shower   Additional Comments: Pt lives alone. Has 2 sons in the area that are available to assist and drive her.       Prior Functioning/Environment Level of Independence: Independent with assistive device(s)        Comments: Pt performs all ADLs independently except for driving. Pt ambulates with SPC.     OT Diagnosis: Generalized weakness;Acute pain;Other (comment) (decreased ability to perform self care tasks)   OT Problem List: Decreased strength;Impaired balance (sitting and/or standing);Decreased knowledge of precautions;Pain;Decreased knowledge of use of DME or AE;Decreased activity tolerance   OT Treatment/Interventions: Self-care/ADL training;Therapeutic exercise;Patient/family education;Balance training;Therapeutic activities;DME and/or AE instruction    OT Goals(Current goals can be found in the care plan section) Acute Rehab OT Goals Patient Stated Goal: to get back to doing everything she did before OT Goal Formulation: With patient Time For Goal Achievement: 03/06/16 Potential to Achieve Goals: Good  OT Frequency: Min 1X/week   Barriers to D/C:            Co-evaluation              End of Session Equipment Utilized During Treatment: Gait belt;Rolling walker Nurse Communication: Mobility status  Activity Tolerance: Patient tolerated treatment well;Patient limited by pain Patient left: in chair;with call bell/phone within reach;with chair alarm set;with nursing/sitter in room   Time: [phone] OT Time Calculation (min): 44 min Charges:  OT General Charges $OT Visit: 1 Procedure OT Evaluation $OT Eval Low Complexity: 1 Procedure OT Treatments $Self Care/Home Management : 23-37 mins G-Codes:    Shaquille Murdy T Kennia Vanvorst, OTR/L, CLT Mekai Wilkinson 02/21/2016, 10:05 AM

## 2016-02-21 NOTE — Discharge Summary (Signed)
Physician Discharge Summary  Patient ID: Ashley [name] MRN: [id number] DOB/AGE: 1931/02/28 82 y.o.  Admit date: 02/20/2016 Discharge date: 02/21/2016  Admission Diagnoses:  OSTEOARTHRITIS   Discharge Diagnoses: Patient Active Problem List   Diagnosis Date Noted  . Primary osteoarthritis of right hip 02/20/2016    Past Medical History  Diagnosis Date  . Coronary artery disease   . Hypertension   . Anemia   . Shortness of breath dyspnea   . COPD (chronic obstructive pulmonary disease) ([place])   . Myocardial infarction ([place])   . Peripheral vascular disease ([place])   . Anxiety   . GERD (gastroesophageal reflux disease)   . Arthritis   . Edema     feet/ankles   wears support hose  . GI (gastrointestinal bleed)     history     Transfusion: none   Consultants (if any): Treatment Team:  Fritzi Mandes, MD  Discharged Condition: Improved  Hospital Course: Ashley [name] is an 82 y.o. female who was admitted 02/20/2016 with a diagnosis of <principal problem not specified> and went to the operating room on 02/20/2016 and underwent the above named procedures.    Surgeries: Procedure(s): TOTAL HIP ARTHROPLASTY ANTERIOR APPROACH on 02/20/2016 Patient tolerated the surgery well. Taken to PACU where she was stabilized and then transferred to the orthopedic floor.  Started on Lovenox 30 q 12 hrs. Foot pumps applied bilaterally at 80 mm. Heels elevated on bed with rolled towels. No evidence of DVT. Negative [place]. Physical therapy started on day #1 for gait training and transfer. OT started day #1 for ADL and assisted devices.  Patient's foley was d/c on day #1. Patient's IV and hemovac was d/c on day #2.  On post op day #3 patient was stable and ready for discharge to SNF.  Implants: Medacta AMIS 3 standard stem with 46 mm Mpact cup DM with S 22.2 mm head and liner  She was given perioperative antibiotics:  Anti-infectives    Start     Dose/Rate Route Frequency Ordered Stop   02/20/16  1215  ceFAZolin (ANCEF) IVPB 2g/100 mL premix     2 g 200 mL/hr over 30 Minutes Intravenous Every 6 hours 02/20/16 1130 02/20/16 2144   02/20/16 0605  ceFAZolin (ANCEF) 2-4 GM/100ML-% IVPB    Comments:  KENNEDY, DENISE: cabinet override      02/20/16 0605 02/20/16 1814   02/20/16 0215  ceFAZolin (ANCEF) IVPB 2g/100 mL premix     2 g 200 mL/hr over 30 Minutes Intravenous  Once 02/20/16 0213 02/20/16 0759    .  She was given sequential compression devices, early ambulation, and lovenox for DVT prophylaxis.  She benefited maximally from the hospital stay and there were no complications.    Recent vital signs:  Filed Vitals:   02/21/16 0507 02/21/16 0802  BP: 148/65 145/59  Pulse: 79 90  Temp: 98.4 F (36.9 C) 99 F (37.2 C)  Resp: 18 20    Recent laboratory studies:  Lab Results  Component Value Date   HGB 12.2 02/20/2016   HGB 14.4 02/12/2016   Lab Results  Component Value Date   WBC 12.8* 02/20/2016   PLT 160 02/20/2016   Lab Results  Component Value Date   INR 1.03 02/12/2016   Lab Results  Component Value Date   NA 141 02/12/2016   K 3.9 02/12/2016   CL 105 02/12/2016   CO2 30 02/12/2016   BUN 24* 02/12/2016   CREATININE 0.66 02/20/2016   GLUCOSE 102*  02/12/2016    Discharge Medications:     Medication List    ASK your doctor about these medications        ALPRAZolam 0.25 MG tablet  Commonly known as:  XANAX  Take 0.25 mg by mouth 2 (two) times daily. As needed usually just at bedtime     amLODipine 10 MG tablet  Commonly known as:  NORVASC  Take 10 mg by mouth daily.     COMBIVENT RESPIMAT 20-100 MCG/ACT Aers respimat  Generic drug:  Ipratropium-Albuterol  Inhale 1 puff into the lungs every 6 (six) hours.     ibuprofen 200 MG tablet  Commonly known as:  ADVIL,MOTRIN  Take 200 mg by mouth 3 (three) times daily.     isosorbide dinitrate 30 MG tablet  Commonly known as:  ISORDIL  Take 30 mg by mouth 2 (two) times daily.     lisinopril 10  MG tablet  Commonly known as:  PRINIVIL,ZESTRIL  Take 5 mg by mouth daily.     metoprolol succinate 50 MG 24 hr tablet  Commonly known as:  TOPROL-XL  Take 75 mg by mouth daily. Take with or immediately following a meal.     omeprazole 20 MG capsule  Commonly known as:  PRILOSEC  Take 20 mg by mouth daily.     simvastatin 40 MG tablet  Commonly known as:  ZOCOR  Take 40 mg by mouth daily.     traMADol 50 MG tablet  Commonly known as:  ULTRAM  Take 50 mg by mouth every 6 (six) hours as needed.     VITAMIN B-12 CR PO  Take by mouth.     VITAMIN D-3 PO  Take by mouth.        Diagnostic Studies: Dg Hip Operative Unilat W Or W/o Pelvis Right  02/20/2016  CLINICAL DATA:  Right hip replacement. EXAM: OPERATIVE RIGHT HIP (WITH PELVIS IF PERFORMED) 3 VIEWS TECHNIQUE: Fluoroscopic spot image(s) were submitted for interpretation post-operatively. COMPARISON:  No recent prior. FINDINGS: Total right hip replacement with good anatomic alignment. Visualized hardware intact. IMPRESSION: Total right hip replacement with good anatomic alignment. Electronically Signed   By: Marcello Moores  Register   On: 02/20/2016 09:28   Dg Hip Unilat W Or W/o Pelvis 2-3 Views Right  02/20/2016  CLINICAL DATA:  Status post right hip surgery EXAM: DG HIP (WITH OR WITHOUT PELVIS) 2-3V RIGHT COMPARISON:  None. FINDINGS: A right hip replacement is noted in satisfactory position. Surgical drains are seen. No soft tissue abnormality is noted. IMPRESSION: Status post right hip replacement Electronically Signed   By: Inez Catalina M.D.   On: 02/20/2016 10:42    Disposition:        Signed: Dorise Hiss CHRISTOPHER 02/21/2016, 8:26 AM

## 2016-02-21 NOTE — Care Management Important Message (Signed)
Important Message  Patient Details  Name: Ashley [name] MRN: [id number] Date of Birth: [birth year]   Medicare Important Message Given:  Yes    Toy Eisemann A, RN 02/21/2016, 7:40 AM

## 2016-02-21 NOTE — Progress Notes (Signed)
   Subjective: 1 Day Post-Op Procedure(s) (LRB): TOTAL HIP ARTHROPLASTY ANTERIOR APPROACH (Right) Patient reports pain as 5 on 0-10 scale.   Patient is well, and has had no acute complaints or problems Denies any CP, SOB, ABD pain. We will continue therapy today.  Plan is to go Skilled nursing facility Sunday  Objective: Vital signs in last 24 hours: Temp:  [93.3 F (34.1 C)-99 F (37.2 C)] 99 F (37.2 C) (04/28 0802) Pulse Rate:  [52-90] 90 (04/28 0802) Resp:  [9-24] 20 (04/28 0802) BP: (95-168)/(50-75) 145/59 mmHg (04/28 0802) SpO2:  [90 %-100 %] 95 % (04/28 0802)  Intake/Output from previous day: 04/27 0701 - 04/28 0700 In: 2597.5 [P.O.:240; I.V.:2357.5] Out: 2230 [Urine:1755; Drains:70; Blood:400] Intake/Output this shift:     Recent Labs  02/20/16 1321  HGB 12.2    Recent Labs  02/20/16 1321  WBC 12.8*  RBC 4.46  HCT 38.4  PLT 160    Recent Labs  02/20/16 1321  CREATININE 0.66   No results for input(s): LABPT, INR in the last 72 hours.  EXAM General - Patient is Alert, Appropriate and Oriented Extremity - Neurovascular intact Sensation intact distally Intact pulses distally Dorsiflexion/Plantar flexion intact Dressing - dressing C/D/I and scant drainage, hemovac intact Motor Function - intact, moving foot and toes well on exam.   Past Medical History  Diagnosis Date  . Coronary artery disease   . Hypertension   . Anemia   . Shortness of breath dyspnea   . COPD (chronic obstructive pulmonary disease) ([place])   . Myocardial infarction ([place])   . Peripheral vascular disease ([place])   . Anxiety   . GERD (gastroesophageal reflux disease)   . Arthritis   . Edema     feet/ankles   wears support hose  . GI (gastrointestinal bleed)     history    Assessment/Plan:   1 Day Post-Op Procedure(s) (LRB): TOTAL HIP ARTHROPLASTY ANTERIOR APPROACH (Right) Active Problems:   Primary osteoarthritis of right hip  Estimated body mass index is 36.94  kg/(m^2) as calculated from the following:   Height as of this encounter: 4\' 11"  (1.499 m).   Weight as of this encounter: 83.008 kg (183 lb). Advance diet Up with therapy Needs BM Recheck labs in the am Plan on discharge to [facility] Sunday  DVT Prophylaxis - Lovenox, Foot Pumps and TED hose Weight-Bearing as tolerated to right leg D/C O2 and Pulse OX and try on Room Air  T. Rachelle Hora, PA-C [place] 02/21/2016, 8:19 AM

## 2016-02-21 NOTE — Progress Notes (Signed)
Plan is for patient to D/C to [facility] Sunday 02/23/16 pending medical clearance. Per Kim admissions coordinator at [facility] patient will go to room 209-A. RN will call report at [phone]. Clinical Education officer, museum (CSW) sent D/C Summary today to [employer] via [employer]. Patient is aware of above. Patient's son Abe People is aware of above. CSW will continue to follow and assist as needed.   Blima Rich, LCSW [phone]

## 2016-02-21 NOTE — Progress Notes (Signed)
[place] [facility] [place] NAME: Ashley [name]    MR#:  [id number]  DATE OF BIRTH:  Jul 24, 1931  SUBJECTIVE:  CHIEF COMPLAINT:  Patient is resting comfortably. Denies any chest pain or shortness of breath. Feeling fine. Reports she takes her acid reflux medicine first thing in the morning which is not happening in the hospital  REVIEW OF SYSTEMS:  CONSTITUTIONAL: No fever, fatigue or weakness.  EYES: No blurred or double vision.  EARS, NOSE, AND THROAT: No tinnitus or ear pain.  RESPIRATORY: No cough, shortness of breath, wheezing or hemoptysis.  CARDIOVASCULAR: No chest pain, orthopnea, edema.  GASTROINTESTINAL: No nausea, vomiting, diarrhea or abdominal pain.  GENITOURINARY: No dysuria, hematuria.  ENDOCRINE: No polyuria, nocturia,  HEMATOLOGY: No anemia, easy bruising or bleeding SKIN: No rash or lesion. MUSCULOSKELETAL: Right hip with clean dressing.  NEUROLOGIC: No tingling, numbness, weakness.  PSYCHIATRY: No anxiety or depression.   DRUG ALLERGIES:  No Known Allergies  VITALS:  Blood pressure 145/59, pulse 90, temperature 99 F (37.2 C), temperature source Oral, resp. rate 20, height 4\' 11"  (1.499 m), weight 83.008 kg (183 lb), SpO2 95 %.  PHYSICAL EXAMINATION:  GENERAL:  82 y.o.-year-old patient lying in the bed with no acute distress.  EYES: Pupils equal, round, reactive to light and accommodation. No scleral icterus. Extraocular muscles intact.  HEENT: Head atraumatic, normocephalic. Oropharynx and nasopharynx clear.  NECK:  Supple, no jugular venous distention. No thyroid enlargement, no tenderness.  LUNGS: Normal breath sounds bilaterally, no wheezing, rales,rhonchi or crepitation. No use of accessory muscles of respiration.  CARDIOVASCULAR: S1, S2 normal. No murmurs, rubs, or gallops.  ABDOMEN: Soft, nontender, nondistended. Bowel sounds present. No organomegaly or mass.  EXTREMITIES: Right hip with clean dressing No pedal  edema, cyanosis, or clubbing.  NEUROLOGIC: Cranial nerves II through XII are intact. Muscle strength 5/5 in all extremities. Sensation intact. Gait not checked.  PSYCHIATRIC: The patient is alert and oriented x 3.  SKIN: No obvious rash, lesion, or ulcer.    LABORATORY PANEL:   CBC  Recent Labs Lab 02/20/16 1321  WBC 12.8*  HGB 12.2  HCT 38.4  PLT 160   ------------------------------------------------------------------------------------------------------------------  Chemistries   Recent Labs Lab 02/20/16 1321  CREATININE 0.66   ------------------------------------------------------------------------------------------------------------------  Cardiac Enzymes  Recent Labs Lab 02/21/16 0916  TROPONINI <0.03   ------------------------------------------------------------------------------------------------------------------  RADIOLOGY:  Dg Hip Operative Unilat W Or W/o Pelvis Right  02/20/2016  CLINICAL DATA:  Right hip replacement. EXAM: OPERATIVE RIGHT HIP (WITH PELVIS IF PERFORMED) 3 VIEWS TECHNIQUE: Fluoroscopic spot image(s) were submitted for interpretation post-operatively. COMPARISON:  No recent prior. FINDINGS: Total right hip replacement with good anatomic alignment. Visualized hardware intact. IMPRESSION: Total right hip replacement with good anatomic alignment. Electronically Signed   By: Marcello Moores  Register   On: 02/20/2016 09:28   Dg Hip Unilat W Or W/o Pelvis 2-3 Views Right  02/20/2016  CLINICAL DATA:  Status post right hip surgery EXAM: DG HIP (WITH OR WITHOUT PELVIS) 2-3V RIGHT COMPARISON:  None. FINDINGS: A right hip replacement is noted in satisfactory position. Surgical drains are seen. No soft tissue abnormality is noted. IMPRESSION: Status post right hip replacement Electronically Signed   By: Inez Catalina M.D.   On: 02/20/2016 10:42    EKG:   Orders placed or performed during the hospital encounter of 02/20/16  . EKG 12-Lead  . EKG 12-Lead  . EKG  12-Lead  . EKG 12-Lead  . EKG 12-Lead  .  EKG 12-Lead    ASSESSMENT AND PLAN:    Ashley [name] is a 82 y.o. female with a known history of CAD status post stenting COPD, anxiety, GERD, hypertension is status post right total hip arthroplasty. Patient was having dinner in the evening. She felt some indigestion thereafter started having chest pain and felt she had to pass gas.   1. Chest pain appears atypical. Symptoms resolved Likely due to GERD Cardiac markers-0.032, ruled out acute MI Continue aspirin 81 mg, Imdur,beta blockers, lisinopril and amlodipine Consider a cardiology consultation if needed. Patient sees Dr. Ubaldo Glassing  2. Status post right hip total arthroplasty Management per ortho  3 hyperlipidemia on simvastatin  4 hypertension continue metoprolol, lisinopril, amlodipine  5 GERD continue Protonix, changed to 6 AM daily When necessary Maalox.    All the records are reviewed and case discussed with Care Management/Social Workerr. Management plans discussed with the patient, she is in agreement.  CODE STATUS:   TOTAL TIME TAKING CARE OF THIS PATIENT: 33 minutes.   We will sign off. Please consult the hospitalist as needed  Nicholes Mango M.D on 02/21/2016 at 2:52 PM  Between 7am to 6pm - Pager - [phone] After 6pm go to [email or web address] - password [email or web address] [place] [facility]  Office  [phone]  CC: Primary care physician; Albina Billet, MD

## 2016-02-21 NOTE — Care Management Note (Signed)
Case Management Note  Patient Details  Name: JAMERE HALLOWELL MRN: [id number] Date of Birth: Mar 05, 1931  Subjective/Objective:     Discussed discharge planning with SW. Current discharge plan is discharge to [facility] on Sunday.                Action/Plan:   Expected Discharge Date:                  Expected Discharge Plan:     In-House Referral:     Discharge planning Services     Post Acute Care Choice:    Choice offered to:     DME Arranged:    DME Agency:     HH Arranged:    [place] Agency:     Status of Service:     Medicare Important Message Given:  Yes Date Medicare IM Given:    Medicare IM give by:    Date Additional Medicare IM Given:    Additional Medicare Important Message give by:     If discussed at [place] of Stay Meetings, dates discussed:    Additional Comments:  Eitan Doubleday A, RN 02/21/2016, 8:54 AM

## 2016-02-22 LAB — BASIC METABOLIC PANEL
ANION GAP: 5 (ref 5–15)
BUN: 18 mg/dL (ref 6–20)
CALCIUM: 8.8 mg/dL — AB (ref 8.9–10.3)
CO2: 26 mmol/L (ref 22–32)
Chloride: 109 mmol/L (ref 101–111)
Creatinine, Ser: 0.57 mg/dL (ref 0.44–1.00)
Glucose, Bld: 115 mg/dL — ABNORMAL HIGH (ref 65–99)
POTASSIUM: 4 mmol/L (ref 3.5–5.1)
SODIUM: 140 mmol/L (ref 135–145)

## 2016-02-22 LAB — CBC
HCT: 34.6 % — ABNORMAL LOW (ref 35.0–47.0)
HEMOGLOBIN: 11.4 g/dL — AB (ref 12.0–16.0)
MCH: 28.3 pg (ref 26.0–34.0)
MCHC: 32.9 g/dL (ref 32.0–36.0)
MCV: 85.9 fL (ref 80.0–100.0)
Platelets: 135 10*3/uL — ABNORMAL LOW (ref 150–440)
RBC: 4.03 MIL/uL (ref 3.80–5.20)
RDW: 14.7 % — AB (ref 11.5–14.5)
WBC: 9.2 10*3/uL (ref 3.6–11.0)

## 2016-02-22 LAB — URINALYSIS COMPLETE WITH MICROSCOPIC (ARMC ONLY)
BILIRUBIN URINE: NEGATIVE
Bacteria, UA: NONE SEEN
GLUCOSE, UA: NEGATIVE mg/dL
KETONES UR: NEGATIVE mg/dL
Leukocytes, UA: NEGATIVE
Nitrite: NEGATIVE
PH: 6 (ref 5.0–8.0)
Protein, ur: NEGATIVE mg/dL
Specific Gravity, Urine: 1.008 (ref 1.005–1.030)

## 2016-02-22 MED ORDER — LACTULOSE 10 GM/15ML PO SOLN
10.0000 g | Freq: Two times a day (BID) | ORAL | Status: DC | PRN
  Administered 2016-02-23: 10 g via ORAL
  Filled 2016-02-22: qty 30

## 2016-02-22 NOTE — Plan of Care (Signed)
Problem: Acute Rehab OT Goals (only OT should resolve) Goal: Pt. Will Perform Lower Body Dressing Patient will complete lower body dressing with moderate assist of one and adaptive equipment as needed.  Outcome: Progressing Pt. Required max Ax2 to perform doffing disposable brief while standing and total Ax2 to don brief from bed level while rolling side to side Goal: Pt. Will Transfer To Toilet Patient will complete toilet transfer with min guard to [facility]  Outcome: Progressing Mod Ax2 >BSC with VC for hand placement and sequencing with encouragement to put weight through RLE

## 2016-02-22 NOTE — Progress Notes (Signed)
Occupational Therapy Treatment Patient Details Name: KEYASHA SLIMMER MRN: [id number] DOB: 1931/03/30 Today's Date: 02/22/2016    History of present illness     OT comments  Pt is 82 year old female s/p R THR.  Pt is currently limited in functional ADLs due to pain and decreased ROM.  Pt requires moderate to maximum assist for LB dressing and toileting skills due to pain and decreased AROM of RLE.  and would benefit from continued skilled OT services for education in assistive devices and functional mobility.       Follow Up Recommendations  SNF    Equipment Recommendations       Recommendations for Other Services      Precautions / Restrictions Precautions Precautions: Anterior Hip Restrictions Weight Bearing Restrictions: Yes RLE Weight Bearing: Weight bearing as tolerated       Mobility Bed Mobility Overal bed mobility: Needs Assistance;+2 for physical assistance Bed Mobility: Rolling;Supine to Sit;Sit to Supine Rolling: +2 for physical assistance;Max assist   Supine to sit: +2 for physical assistance;HOB elevated;Mod assist Sit to supine: HOB elevated;+2 for physical assistance;Mod assist   General bed mobility comments: Modx2 for bed mobility with Mod verbal cues for hand placement on bed rails and for swinging BLE into bed  Transfers Overall transfer level: Needs assistance Equipment used: 2 person hand held assist Transfers: Sit to/from Stand Sit to Stand: Mod assist;+2 physical assistance         General transfer comment: Required cuing for sequencing and encouragement to put weight through RLE    Balance Overall balance assessment: Needs assistance Sitting-balance support: Bilateral upper extremity supported;Feet supported Sitting balance-Leahy Scale: Good     Standing balance support: Bilateral upper extremity supported;During functional activity Standing balance-Leahy Scale: Fair Standing balance comment: Able to maintain balance while standing to  perform transfer to [facility] with Min Ax2 and VC for sequencing and encouragement to use RLE                   ADL Overall ADL's : Needs assistance/impaired     Grooming: Oral care;Set up;Bed level;Maximal assistance Grooming Details (indicate cue type and reason): Pt. able to verbalize sequencing of set up for denture cleaning             Lower Body Dressing: +2 for physical assistance;Bed level;Adhering to hip precautions;Cueing for safety;Total assistance Lower Body Dressing Details (indicate cue type and reason): verbal cues for hand placement to assist with rolling side to side in order to don disposable brief Toilet Transfer: +2 for safety/equipment;BSC;Cueing for safety;Adhering to hip precautions;Minimal assistance Toilet Transfer Details (indicate cue type and reason): VC for hand placement and feet placement to perform t/f to [place]- Clothing Manipulation and Hygiene: Total assistance;Cueing for safety;Adhering to hip precautions;Sit to/from stand;+2 for safety/equipment Toileting - Clothing Manipulation Details (indicate cue type and reason): Total A for clothing mgt and hygiene while standing     Functional mobility during ADLs: Minimal assistance;+2 for safety/equipment;+2 for physical assistance;Cueing for safety        Vision                     Perception     Praxis      Cognition   Behavior During Therapy: [facility] for tasks assessed/performed Overall Cognitive Status: Within Functional Limits for tasks assessed                       Extremity/Trunk Assessment  Exercises     Shoulder Instructions       General Comments      Pertinent Vitals/ Pain       Pain Assessment: 0-10 Pain Score: 6  Pain Location: R hip Pain Descriptors / Indicators: Grimacing;Moaning;Operative site guarding Pain Intervention(s): Limited activity within patient's tolerance;Monitored during session  Home Living                                           Prior Functioning/Environment              Frequency Min 1X/week     Progress Toward Goals  OT Goals(current goals can now be found in the care plan section)  Progress towards OT goals: Progressing toward goals  Acute Rehab OT Goals Patient Stated Goal: to return to PLOF OT Goal Formulation: With patient Time For Goal Achievement: 03/06/16 Potential to Achieve Goals: Good  Plan Discharge plan remains appropriate    Co-evaluation    PT/OT/SLP Co-Evaluation/Treatment: Yes Reason for Co-Treatment: Complexity of the patient's impairments (multi-system involvement);For patient/therapist safety PT goals addressed during session: Strengthening/ROM OT goals addressed during session: ADL's and self-care      End of Session Equipment Utilized During Treatment: Gait belt   Activity Tolerance Patient limited by pain;Patient tolerated treatment well   Patient Left in bed;with call bell/phone within reach   Nurse Communication          Time: [phone] OT Time Calculation (min): 33 min  Charges: OT General Charges $OT Visit: 1 Procedure OT Treatments $Self Care/Home Management : 8-22 mins  Corky Sox, OTR/L  02/22/2016, 10:59 AM

## 2016-02-22 NOTE — Progress Notes (Signed)
Physical Therapy Treatment Patient Details Name: Ashley [name] MRN: [id number] DOB: 10-20-1931 Today's Date: 02/22/2016    History of Present Illness Pt is an 82 y.o. F s/p R THA on 4/27. Prior to admission pt independent with all ADLs, IADLs except driving.  Son brings in her meals at home.      PT Comments    Pt was admitted for R THA with pt having very poor flexion and mobility of R hip abd/add, as well as limited tolerance for standing and gait.  Had co-visit with OT due to her dependence on assistance, and will need to be a SNF candidate still as evidenced by her need for help to transition positions even with AD's such as trapeze bar.  Will continue BID and dc planned for tomorrow.  Follow Up Recommendations  SNF     Equipment Recommendations  Rolling walker with 5" wheels    Recommendations for Other Services Rehab consult     Precautions / Restrictions Precautions Precautions: Anterior Hip Restrictions Weight Bearing Restrictions: Yes RLE Weight Bearing: Weight bearing as tolerated    Mobility  Bed Mobility Overal bed mobility: Needs Assistance;+2 for physical assistance;+ 2 for safety/equipment Bed Mobility: Rolling;Sit to Supine Rolling: +2 for physical assistance;+2 for safety/equipment;Max assist   Supine to sit: +2 for physical assistance;+2 for safety/equipment;Max assist;HOB elevated Sit to supine: +2 for physical assistance;+2 for safety/equipment;HOB elevated;Max assist   General bed mobility comments: pt needed to be cued for all steps of mobility  Transfers Overall transfer level: Needs assistance Equipment used: 2 person hand held assist;Rolling walker (2 wheeled) Transfers: Sit to/from Stand Sit to Stand: Mod assist;+2 physical assistance;+2 safety/equipment;From elevated surface         General transfer comment: Pt needed cues for all steps of the task  Ambulation/Gait Ambulation/Gait assistance: +2 physical assistance;+2  safety/equipment;Max assist;Mod assist Ambulation Distance (Feet): 3 Feet Assistive device: Rolling walker (2 wheeled) Gait Pattern/deviations: Step-to pattern;Shuffle;Wide base of support;Trunk flexed;Decreased weight shift to right;Decreased stance time - right Gait velocity: slow Gait velocity interpretation: Below normal speed for age/gender General Gait Details: Pt was up to [employer] with short gait on RW with cues for all movement of feet   Stairs            Wheelchair Mobility    Modified Rankin (Stroke Patients Only)       Balance Overall balance assessment: Needs assistance Sitting-balance support: Feet supported Sitting balance-Leahy Scale: Good Sitting balance - Comments: Pt does not seem aware of being too close to EOB Postural control: Posterior lean Standing balance support: Bilateral upper extremity supported Standing balance-Leahy Scale: Poor Standing balance comment: in maybe more pain than last visit                    Cognition Arousal/Alertness: Awake/alert Behavior During Therapy: WFL for tasks assessed/performed Overall Cognitive Status: Within Functional Limits for tasks assessed                      Exercises Total Joint Exercises Ankle Circles/Pumps: AROM;PROM;Both;10 reps Quad Sets: AROM;Both;10 reps Heel Slides: AAROM;AROM;Both;10 reps Hip ABduction/ADduction: AROM;AAROM;Both;10 reps    General Comments General comments (skin integrity, edema, etc.): Pt has a struggle to get up to bedside, then back and was total assist with 2 people in bed to either roll or scoot up but did hold trapeze bar in her hands      Pertinent Vitals/Pain Pain Assessment: 0-10 Pain Score: 6  Pain  Location: R hip  Pain Descriptors / Indicators: Aching;Sore Pain Intervention(s): Monitored during session;Premedicated before session;Limited activity within patient's tolerance;Repositioned    Home Living                      Prior Function             PT Goals (current goals can now be found in the care plan section) Acute Rehab PT Goals Patient Stated Goal: to return to PLOF Progress towards PT goals: Progressing toward goals    Frequency  7X/week (BID as is appropriate)    PT Plan Current plan remains appropriate    Co-evaluation PT/OT/SLP Co-Evaluation/Treatment: Yes Reason for Co-Treatment: For patient/therapist safety PT goals addressed during session: Mobility/safety with mobility;Balance;Strengthening/ROM OT goals addressed during session: ADL's and self-care     End of Session Equipment Utilized During Treatment: Gait belt Activity Tolerance: Patient limited by fatigue;Patient limited by pain Patient left: in bed;with call bell/phone within reach;with bed alarm set     Time: 1005-1028 PT Time Calculation (min) (ACUTE ONLY): 23 min  Charges:  $Therapeutic Exercise: 8-22 mins                    G Codes:      Ramond Dial 2016/03/03, 11:57 AM  Mee Hives, PT MS Acute Rehab Dept. Number: [facility] [phone] and [place] [phone]

## 2016-02-22 NOTE — Progress Notes (Signed)
Physical Therapy Treatment Patient Details Name: Ashley [name] MRN: [id number] DOB: [birth year] Today's Date: 02/22/2016    History of Present Illness Pt is an 82 y.o. F s/p R THA on 4/27. Prior to admission pt independent with all ADLs, IADLs except driving.  Son brings in her meals at home.      PT Comments    Patient demonstrates good motivation this afternoon during PT session. Was able to push herself to perform a few extra repetitions of exercises, showing improved tolerance of movement of hip. Patient will benefit from progressive strengthening to require less assistance with bed mobility and gait.  Follow Up Recommendations  SNF     Equipment Recommendations  Rolling walker with 5" wheels    Recommendations for Other Services       Precautions / Restrictions Precautions Precautions: Anterior Hip;Fall Precaution Booklet Issued: Yes (comment) Restrictions Weight Bearing Restrictions: Yes RLE Weight Bearing: Weight bearing as tolerated    Mobility  Bed Mobility Overal bed mobility: Needs Assistance Bed Mobility: Rolling Rolling: Mod assist         General bed mobility comments: Patient able to use bed rails to roll towards L with moderate assistance. Performed x5.  Transfers                    Ambulation/Gait                 Stairs            Wheelchair Mobility    Modified Rankin (Stroke Patients Only)       Balance                                    Cognition Arousal/Alertness: Awake/alert Behavior During Therapy: WFL for tasks assessed/performed Overall Cognitive Status: Within Functional Limits for tasks assessed                      Exercises Total Joint Exercises Ankle Circles/Pumps: AROM;20 reps Quad Sets: AROM;20 reps Gluteal Sets: AROM;20 reps Short Arc Quad: AROM;15 reps Heel Slides: AAROM;15 reps Hip ABduction/ADduction: AAROM;15 reps Straight Leg Raises: AAROM;15 reps    General  Comments        Pertinent Vitals/Pain Pain Assessment: 0-10 Pain Score: 2  Pain Location: R hip Pain Descriptors / Indicators: Aching Pain Intervention(s): Limited activity within patient's tolerance;Monitored during session    Home Living                      Prior Function            PT Goals (current goals can now be found in the care plan section) Acute Rehab PT Goals Patient Stated Goal: to return to PLOF PT Goal Formulation: With patient Time For Goal Achievement: 03/05/16 Potential to Achieve Goals: Fair Progress towards PT goals: Progressing toward goals    Frequency  7X/week    PT Plan Current plan remains appropriate    Co-evaluation             End of Session Equipment Utilized During Treatment: Oxygen Activity Tolerance: Patient tolerated treatment well Patient left: in bed;with call bell/phone within reach;with bed alarm set;with SCD's reapplied     Time: [id number] PT Time Calculation (min) (ACUTE ONLY): 30 min  Charges:  $Therapeutic Exercise: 23-37 mins  G Codes:      Dorice Lamas, PT, DPT 02/22/2016, 3:04 PM

## 2016-02-22 NOTE — Progress Notes (Signed)
Spoke with MD about pt c/o burning with urination. Orders for urinalysis placed. Urine collected and sent to lab. Awaiting results.

## 2016-02-22 NOTE — Anesthesia Postprocedure Evaluation (Signed)
Anesthesia Post Note  Patient: [name]  Procedure(s) Performed: Procedure(s) (LRB): TOTAL HIP ARTHROPLASTY ANTERIOR APPROACH (Right)  Patient location during evaluation: PACU Anesthesia Type: General Level of consciousness: awake and alert Pain management: pain level controlled Vital Signs Assessment: post-procedure vital signs reviewed and stable Respiratory status: spontaneous breathing, nonlabored ventilation, respiratory function stable and patient connected to nasal cannula oxygen Cardiovascular status: blood pressure returned to baseline and stable Postop Assessment: no signs of nausea or vomiting Anesthetic complications: no    Last Vitals:  Filed Vitals:   02/22/16 0426 02/22/16 0755  BP: 127/62 148/62  Pulse: 87 103  Temp: 36.8 C 37 C  Resp: 20 14    Last Pain:  Filed Vitals:   02/22/16 0919  PainSc: 0-No pain                 Jaylina Ramdass S

## 2016-02-22 NOTE — Progress Notes (Signed)
   Subjective: 2 Days Post-Op Procedure(s) (LRB): TOTAL HIP ARTHROPLASTY ANTERIOR APPROACH (Right) Patient reports pain as mild.   Patient is well, and has had no acute complaints or problems Continue with physical therapy today.  Plan is to go Rehab after hospital stay. no nausea and no vomiting Patient denies any chest pains or shortness of breath. Objective: Vital signs in last 24 hours: Temp:  [98.2 F (36.8 C)-99.9 F (37.7 C)] 98.2 F (36.8 C) (04/29 0426) Pulse Rate:  [76-90] 87 (04/29 0426) Resp:  [18-20] 20 (04/29 0426) BP: (127-145)/(57-62) 127/62 mmHg (04/29 0426) SpO2:  [95 %-96 %] 96 % (04/29 0426) well approximated incision Heels are non tender and elevated off the bed using rolled towels Intake/Output from previous day: 04/28 0701 - 04/29 0700 In: 240 [P.O.:240] Out: 575 [Urine:525; Drains:50] Intake/Output this shift:     Recent Labs  02/20/16 1321 02/22/16 0429  HGB 12.2 11.4*    Recent Labs  02/20/16 1321 02/22/16 0429  WBC 12.8* 9.2  RBC 4.46 4.03  HCT 38.4 34.6*  PLT 160 135*    Recent Labs  02/20/16 1321 02/22/16 0429  NA  --  140  K  --  4.0  CL  --  109  CO2  --  26  BUN  --  18  CREATININE 0.66 0.57  GLUCOSE  --  115*  CALCIUM  --  8.8*   No results for input(s): LABPT, INR in the last 72 hours.  EXAM General - Patient is Alert, Disorganized and Confused Extremity - Neurologically intact Neurovascular intact Sensation intact distally Intact pulses distally Dorsiflexion/Plantar flexion intact Dressing - scant drainage Motor Function - intact, moving foot and toes well on exam.    Past Medical History  Diagnosis Date  . Coronary artery disease   . Hypertension   . Anemia   . Shortness of breath dyspnea   . COPD (chronic obstructive pulmonary disease) ([place])   . Myocardial infarction ([place])   . Peripheral vascular disease ([place])   . Anxiety   . GERD (gastroesophageal reflux disease)   . Arthritis   . Edema    feet/ankles   wears support hose  . GI (gastrointestinal bleed)     history    Assessment/Plan: 2 Days Post-Op Procedure(s) (LRB): TOTAL HIP ARTHROPLASTY ANTERIOR APPROACH (Right) Active Problems:   Primary osteoarthritis of right hip  Estimated body mass index is 36.94 kg/(m^2) as calculated from the following:   Height as of this encounter: 4\' 11"  (1.499 m).   Weight as of this encounter: 83.008 kg (183 lb). Up with therapy Plan for discharge tomorrow Discharge to SNF  Labs: reviewed DVT Prophylaxis - Lovenox, Foot Pumps and TED hose Weight-Bearing as tolerated to right leg D/C O2 and Pulse OX and try on Room Air Pt needs a bowel movement today Hemovac d/c'd  Jillyn Ledger. [place] 02/22/2016, 7:43 AM

## 2016-02-23 NOTE — Progress Notes (Signed)
Pt discharged. Report called to [facility]. Pt transported via ems. Pt belongings sent with son.

## 2016-02-23 NOTE — Progress Notes (Signed)
   Subjective: 3 Days Post-Op Procedure(s) (LRB): TOTAL HIP ARTHROPLASTY ANTERIOR APPROACH (Right) Patient reports pain as mild.   Patient is well, and has had no acute complaints or problems Continue with physical therapy today.  Plan is to go Rehab after hospital stay. no nausea and no vomiting Patient denies any chest pains or shortness of breath. Objective: Vital signs in last 24 hours: Temp:  [98.3 F (36.8 C)-98.8 F (37.1 C)] 98.3 F (36.8 C) (04/30 0507) Pulse Rate:  [92-103] 95 (04/30 0507) Resp:  [14-22] 20 (04/29 2032) BP: (125-152)/(58-74) 152/74 mmHg (04/30 0507) SpO2:  [95 %-98 %] 98 % (04/30 0507)  Patient sitting up in bed well approximated incision Heels are non tender and elevated off the bed using rolled towels Intake/Output from previous day: 04/29 0701 - 04/30 0700 In: 240 [P.O.:240] Out: -  Intake/Output this shift:     Recent Labs  02/20/16 1321 02/22/16 0429  HGB 12.2 11.4*    Recent Labs  02/20/16 1321 02/22/16 0429  WBC 12.8* 9.2  RBC 4.46 4.03  HCT 38.4 34.6*  PLT 160 135*    Recent Labs  02/20/16 1321 02/22/16 0429  NA  --  140  K  --  4.0  CL  --  109  CO2  --  26  BUN  --  18  CREATININE 0.66 0.57  GLUCOSE  --  115*  CALCIUM  --  8.8*   No results for input(s): LABPT, INR in the last 72 hours.  EXAM General - Patient is Alert, Appropriate and Oriented Extremity - Neurologically intact Neurovascular intact Sensation intact distally Intact pulses distally Dorsiflexion/Plantar flexion intact Dressing - moderate drainage Motor Function - intact, moving foot and toes well on exam.    Past Medical History  Diagnosis Date  . Coronary artery disease   . Hypertension   . Anemia   . Shortness of breath dyspnea   . COPD (chronic obstructive pulmonary disease) ([place])   . Myocardial infarction ([place])   . Peripheral vascular disease ([place])   . Anxiety   . GERD (gastroesophageal reflux disease)   . Arthritis   . Edema      feet/ankles   wears support hose  . GI (gastrointestinal bleed)     history    Assessment/Plan: 3 Days Post-Op Procedure(s) (LRB): TOTAL HIP ARTHROPLASTY ANTERIOR APPROACH (Right) Active Problems:   Primary osteoarthritis of right hip  Estimated body mass index is 36.94 kg/(m^2) as calculated from the following:   Height as of this encounter: 4\' 11"  (1.499 m).   Weight as of this encounter: 83.008 kg (183 lb). Up with therapy Discharge to SNF today  Labs: none DVT Prophylaxis - Lovenox, Foot Pumps and TED hose Weight-Bearing as tolerated to right leg Patient needs to have a bowel movement prior to being discharged this morning. Patient states that she had a small one but this is not recorded. Please change dressing prior to being discharged.  Jillyn Ledger. [place] 02/23/2016, 7:24 AM

## 2016-02-23 NOTE — Progress Notes (Signed)
Physical Therapy Treatment Patient Details Name: Ashley [name] MRN: [id number] DOB: Dec 10, 1930 Today's Date: 02/23/2016    History of Present Illness Pt is an 82 y.o. F s/p R THA on 4/27. Prior to admission pt independent with all ADLs, IADLs except driving.  Son brings in her meals at home.      PT Comments    Patient seems to be doing better this morning requiring less assistance with bed mobility and transfers. She continues to have difficulty shifting weight to RLE during standing being unable to tolerate gait tasks today. Patient would benefit from additional skilled PT intervention to improve LE strength, balance and gait safety;   Follow Up Recommendations  SNF     Equipment Recommendations  Rolling walker with 5" wheels    Recommendations for Other Services       Precautions / Restrictions Precautions Precautions: Anterior Hip;Fall Precaution Booklet Issued: Yes (comment) Restrictions Weight Bearing Restrictions: Yes RLE Weight Bearing: Weight bearing as tolerated    Mobility  Bed Mobility Overal bed mobility: Needs Assistance Bed Mobility: Supine to Sit Rolling: Mod assist   Supine to sit: Mod assist     General bed mobility comments: Patient required min VCs to use bed rails and to pull self up in sitting, mod A x1 for supine to sitting with elevated head of bed;   Transfers Overall transfer level: Needs assistance Equipment used: Rolling walker (2 wheeled) Transfers: Sit to/from [employer] Sit to Stand: Mod assist;From elevated surface Stand pivot transfers: Mod assist       General transfer comment: Patient required min VCs for hand placement and mod VCs for walker placement during stand pivot transfer bed to chair; She was able to stand with less assistance today and demonstrates erect posture upon standing;   Ambulation/Gait             General Gait Details: patient did not perform gait tasks today other than a few steps  during stand pivot transfer, see above;    Stairs            Wheelchair Mobility    Modified Rankin (Stroke Patients Only)       Balance Overall balance assessment: Needs assistance Sitting-balance support: Bilateral upper extremity supported;Feet supported Sitting balance-Leahy Scale: Good     Standing balance support: Bilateral upper extremity supported Standing balance-Leahy Scale: Fair                      Cognition Arousal/Alertness: Awake/alert Behavior During Therapy: WFL for tasks assessed/performed Overall Cognitive Status: Within Functional Limits for tasks assessed                      Exercises Total Joint Exercises Ankle Circles/Pumps: AROM;Both;15 reps Quad Sets: AROM;Both;15 reps Gluteal Sets: AROM;Both;15 reps Hip ABduction/ADduction: AROM;AAROM;Left;Right;10 reps (AAROM on RLE) Other Exercises Other Exercises: Patient required min VCs to improve ROM for better flexibility and to increase strength; Also required min VCs to increase hold time for better strengthening; Patient able to participate in LE exercise with less assistance on RLE;     General Comments        Pertinent Vitals/Pain Pain Assessment: 0-10 Pain Score: 2  Pain Location: R hip Pain Descriptors / Indicators: Aching;Sore Pain Intervention(s): Limited activity within patient's tolerance;Monitored during session;Repositioned    Home Living                      Prior Function  PT Goals (current goals can now be found in the care plan section) Acute Rehab PT Goals Patient Stated Goal: to return to PLOF PT Goal Formulation: With patient Time For Goal Achievement: 03/05/16 Potential to Achieve Goals: Fair Progress towards PT goals: Progressing toward goals    Frequency  7X/week    PT Plan Current plan remains appropriate    Co-evaluation             End of Session Equipment Utilized During Treatment: Oxygen Activity Tolerance:  Patient tolerated treatment well Patient left: with call bell/phone within reach;in chair;with chair alarm set     Time: 0910-0940 PT Time Calculation (min) (ACUTE ONLY): 30 min  Charges:  $Therapeutic Exercise: 8-22 mins $Therapeutic Activity: 8-22 mins                    G Codes:      Abby Stines PT, DPT 02/23/2016, 9:48 AM

## 2016-02-23 NOTE — Clinical Social Work Note (Signed)
Pt is ready for discharge today to [facility]. Pt and son are agreeable to discharge plan. Facility has received discharge paperwork and is expecting pt today. Rn will call report. [facility] will provide transportation to facility. CSW is signing off as no further needs identified.   Darden Dates, MSW, LCSW  Clinical Social Worker [phone]

## 2016-02-23 NOTE — Clinical Social Work Placement (Signed)
   CLINICAL SOCIAL WORK PLACEMENT  NOTE  Date:  02/23/2016  Patient Details  Name: Ashley [name] MRN: [id number] Date of Birth: 03-28-1931  Clinical Social Work is seeking post-discharge placement for this patient at the [place] level of care (*CSW will initial, date and re-position this form in  chart as items are completed):  Yes   Patient/family provided with [facility] Work Department's list of facilities offering this level of care within the geographic area requested by the patient (or if unable, by the patient's family).  Yes   Patient/family informed of their freedom to choose among providers that offer the needed level of care, that participate in Medicare, Medicaid or managed care program needed by the patient, have an available bed and are willing to accept the patient.  Yes   Patient/family informed of [facility]'s ownership interest in [facility], as well as of the fact that they are under no obligation to receive care at these facilities.  PASRR submitted to EDS on 02/20/16     PASRR number received on 02/20/16     Existing PASRR number confirmed on       FL2 transmitted to all facilities in geographic area requested by pt/family on 02/20/16     FL2 transmitted to all facilities within larger geographic area on       Patient informed that his/her managed care company has contracts with or will negotiate with certain facilities, including the following:        Yes   Patient/family informed of bed offers received.  Patient chooses bed at [facility]     Physician recommends and patient chooses bed at  [facility])    Patient to be transferred to [facility] on 02/23/16.  Patient to be transferred to facility by [facility]     Patient family notified on 02/23/16 of transfer.  Name of family member notified:  Abe People, son     PHYSICIAN       Additional Comment:     _______________________________________________ Darden Dates, LCSW 02/23/2016, 10:04 AM

## 2016-02-24 ENCOUNTER — Encounter
Admission: RE | Admit: 2016-02-24 | Discharge: 2016-02-24 | Disposition: A | Discharge status: Home / Self Care | Payer: Medicare Other | Source: Ambulatory Visit | Attending: Internal Medicine | Admitting: Internal Medicine

## 2016-02-24 LAB — SURGICAL PATHOLOGY

## 2016-03-26 ENCOUNTER — Encounter
Admission: RE | Admit: 2016-03-26 | Discharge: 2016-03-26 | Disposition: A | Discharge status: Home / Self Care | Payer: Medicare Other | Source: Ambulatory Visit | Attending: Internal Medicine | Admitting: Internal Medicine

## 2018-07-12 ENCOUNTER — Other Ambulatory Visit: Payer: Self-pay

## 2018-07-12 ENCOUNTER — Emergency Department: Payer: Medicare Other

## 2018-07-12 ENCOUNTER — Encounter: Payer: Self-pay | Admitting: Emergency Medicine

## 2018-07-12 ENCOUNTER — Inpatient Hospital Stay
Admission: EM | Admit: 2018-07-12 | Discharge: 2018-07-14 | DRG: 291 | Disposition: A | Discharge status: Home / Self Care | Payer: Medicare Other | Attending: Internal Medicine | Admitting: Internal Medicine

## 2018-07-12 DIAGNOSIS — J441 Chronic obstructive pulmonary disease with (acute) exacerbation: Secondary | ICD-10-CM | POA: Diagnosis present

## 2018-07-12 DIAGNOSIS — Z79899 Other long term (current) drug therapy: Secondary | ICD-10-CM

## 2018-07-12 DIAGNOSIS — M1990 Unspecified osteoarthritis, unspecified site: Secondary | ICD-10-CM | POA: Diagnosis present

## 2018-07-12 DIAGNOSIS — Z791 Long term (current) use of non-steroidal anti-inflammatories (NSAID): Secondary | ICD-10-CM | POA: Diagnosis not present

## 2018-07-12 DIAGNOSIS — I25110 Atherosclerotic heart disease of native coronary artery with unstable angina pectoris: Secondary | ICD-10-CM | POA: Diagnosis present

## 2018-07-12 DIAGNOSIS — Z9841 Cataract extraction status, right eye: Secondary | ICD-10-CM

## 2018-07-12 DIAGNOSIS — Z96641 Presence of right artificial hip joint: Secondary | ICD-10-CM | POA: Diagnosis not present

## 2018-07-12 DIAGNOSIS — I252 Old myocardial infarction: Secondary | ICD-10-CM

## 2018-07-12 DIAGNOSIS — R079 Chest pain, unspecified: Secondary | ICD-10-CM | POA: Diagnosis present

## 2018-07-12 DIAGNOSIS — I10 Essential (primary) hypertension: Secondary | ICD-10-CM

## 2018-07-12 DIAGNOSIS — Z9842 Cataract extraction status, left eye: Secondary | ICD-10-CM | POA: Diagnosis not present

## 2018-07-12 DIAGNOSIS — I110 Hypertensive heart disease with heart failure: Secondary | ICD-10-CM | POA: Diagnosis not present

## 2018-07-12 DIAGNOSIS — Z87891 Personal history of nicotine dependence: Secondary | ICD-10-CM

## 2018-07-12 DIAGNOSIS — R0603 Acute respiratory distress: Secondary | ICD-10-CM | POA: Diagnosis not present

## 2018-07-12 DIAGNOSIS — Z90710 Acquired absence of both cervix and uterus: Secondary | ICD-10-CM | POA: Diagnosis not present

## 2018-07-12 DIAGNOSIS — Z961 Presence of intraocular lens: Secondary | ICD-10-CM | POA: Diagnosis present

## 2018-07-12 DIAGNOSIS — Z9861 Coronary angioplasty status: Secondary | ICD-10-CM | POA: Diagnosis not present

## 2018-07-12 DIAGNOSIS — R0989 Other specified symptoms and signs involving the circulatory and respiratory systems: Secondary | ICD-10-CM | POA: Diagnosis not present

## 2018-07-12 DIAGNOSIS — K449 Diaphragmatic hernia without obstruction or gangrene: Secondary | ICD-10-CM | POA: Diagnosis not present

## 2018-07-12 DIAGNOSIS — I739 Peripheral vascular disease, unspecified: Secondary | ICD-10-CM | POA: Diagnosis present

## 2018-07-12 DIAGNOSIS — K219 Gastro-esophageal reflux disease without esophagitis: Secondary | ICD-10-CM | POA: Diagnosis not present

## 2018-07-12 DIAGNOSIS — Z7902 Long term (current) use of antithrombotics/antiplatelets: Secondary | ICD-10-CM | POA: Diagnosis not present

## 2018-07-12 DIAGNOSIS — I5031 Acute diastolic (congestive) heart failure: Secondary | ICD-10-CM | POA: Diagnosis not present

## 2018-07-12 DIAGNOSIS — I200 Unstable angina: Secondary | ICD-10-CM

## 2018-07-12 LAB — BASIC METABOLIC PANEL
Anion gap: 9 (ref 5–15)
BUN: 27 mg/dL — AB (ref 8–23)
CALCIUM: 9.4 mg/dL (ref 8.9–10.3)
CO2: 25 mmol/L (ref 22–32)
CREATININE: 0.72 mg/dL (ref 0.44–1.00)
Chloride: 106 mmol/L (ref 98–111)
GFR calc non Af Amer: >60 mL/min (ref >60)
Glucose, Bld: 117 mg/dL — ABNORMAL HIGH (ref 70–99)
Potassium: 3.6 mmol/L (ref 3.5–5.1)
SODIUM: 140 mmol/L (ref 135–145)

## 2018-07-12 LAB — CBC
HCT: 41.7 % (ref 35.0–47.0)
Hemoglobin: 13.7 g/dL (ref 12.0–16.0)
MCH: 26.1 pg (ref 26.0–34.0)
MCHC: 32.8 g/dL (ref 32.0–36.0)
MCV: 79.7 fL — AB (ref 80.0–100.0)
PLATELETS: 156 10*3/uL (ref 150–440)
RBC: 5.23 MIL/uL — AB (ref 3.80–5.20)
RDW: 16.5 % — AB (ref 11.5–14.5)
WBC: 12 10*3/uL — AB (ref 3.6–11.0)

## 2018-07-12 LAB — TROPONIN I

## 2018-07-12 MED ORDER — NITROGLYCERIN 2 % TD OINT
1.0000 [in_us] | TOPICAL_OINTMENT | Freq: Once | TRANSDERMAL | Status: AC
  Administered 2018-07-13: 1 [in_us] via TOPICAL
  Filled 2018-07-12: qty 1

## 2018-07-12 NOTE — ED Triage Notes (Signed)
Pt in via [facility] from home with complaints of mid sternal chest pain x approximately 3 hours ago.  Reports taking a "pain pill" and pain has since resolved.  Pt unable to recall name of pain pill.  Pt A/Ox4, vitals WDL, NAD noted at this time.

## 2018-07-12 NOTE — ED Provider Notes (Signed)
[facility] Emergency Department Provider Note   ____________________________________________   First MD Initiated Contact with Patient 07/12/18 2315     (approximate)  I have reviewed the triage vital signs and the nursing notes.   HISTORY  Chief Complaint Chest Pain    HPI Ashley [name] is a 84 y.o. female brought to the ED from home via EMS with a chief complaint of chest pain.  Patient has a history of CAD status post MI, COPD, PAD who reports substernal chest tightness, waxing/waning since 4 PM.  Symptoms associated with diaphoresis, shortness of breath, nausea and vomiting.  Patient reports she took a "pain pill" which helped the pain but it subsequently returned.  Denies recent fever, chills, abdominal pain, dysuria, diarrhea.  Denies recent travel or trauma.   Past Medical History:  Diagnosis Date  . Anemia   . Anxiety   . Arthritis   . COPD (chronic obstructive pulmonary disease) ([place])   . Coronary artery disease   . Edema    feet/ankles   wears support hose  . GERD (gastroesophageal reflux disease)   . GI (gastrointestinal bleed)    history  . Hypertension   . Myocardial infarction ([place])   . Peripheral vascular disease ([place])   . Shortness of breath dyspnea     Patient Active Problem List   Diagnosis Date Noted  . Primary osteoarthritis of right hip 02/20/2016    Past Surgical History:  Procedure Laterality Date  . ABDOMINAL HYSTERECTOMY    . ABLATION     right leg less than 1 year ago  . CATARACT EXTRACTION W/ INTRAOCULAR LENS  IMPLANT, BILATERAL Bilateral   . CORONARY ANGIOPLASTY     stents  . EYE SURGERY    . FRACTURE SURGERY     right wrist  . TOTAL HIP ARTHROPLASTY Right 02/20/2016   Procedure: TOTAL HIP ARTHROPLASTY ANTERIOR APPROACH;  Surgeon: Hessie Knows, MD;  Location: [facility] ORS;  Service: Orthopedics;  Laterality: Right;    Prior to Admission medications   Medication Sig Start Date End Date Taking? Authorizing  Provider  ALPRAZolam (XANAX) 0.25 MG tablet Take 0.25 mg by mouth 2 (two) times daily. As needed usually just at bedtime    [provider]  amLODipine (NORVASC) 10 MG tablet Take 10 mg by mouth daily.    [provider]  Cholecalciferol (VITAMIN D-3 PO) Take by mouth.    [provider]  Cyanocobalamin (VITAMIN B-12 CR PO) Take by mouth.    [provider]  enoxaparin (LOVENOX) 40 MG/0.4ML injection Inject 0.4 mLs (40 mg total) into the skin daily. 02/21/16   Duanne Guess, PA-C  ibuprofen (ADVIL,MOTRIN) 200 MG tablet Take 200 mg by mouth 3 (three) times daily.    [provider]  Ipratropium-Albuterol (COMBIVENT RESPIMAT) 20-100 MCG/ACT AERS respimat Inhale 1 puff into the lungs every 6 (six) hours.    [provider]  isosorbide dinitrate (ISORDIL) 30 MG tablet Take 30 mg by mouth 2 (two) times daily.    [provider]  lisinopril (PRINIVIL,ZESTRIL) 10 MG tablet Take 5 mg by mouth daily.    [provider]  metoprolol succinate (TOPROL-XL) 50 MG 24 hr tablet Take 75 mg by mouth daily. Take with or immediately following a meal.    [provider]  omeprazole (PRILOSEC) 20 MG capsule Take 20 mg by mouth daily.    [provider]  oxyCODONE (OXY IR/ROXICODONE) 5 MG immediate release tablet Take 1-2 tablets (  5-10 mg total) by mouth every 3 (three) hours as needed for breakthrough pain. 02/21/16   Duanne Guess, PA-C  simvastatin (ZOCOR) 40 MG tablet Take 40 mg by mouth daily.    [provider]  traMADol (ULTRAM) 50 MG tablet Take 1 tablet (50 mg total) by mouth every 6 (six) hours as needed. 02/21/16   Duanne Guess, PA-C    Allergies Patient has no known allergies.  No family history on file.  Social History Social History   Tobacco Use  . Smoking status: Former Smoker    Last attempt to quit: 02/11/1997    Years since quitting: 21.4  . Smokeless tobacco: Never Used  Substance Use  Topics  . Alcohol use: No  . Drug use: No    Review of Systems  Constitutional: No fever/chills Eyes: No visual changes. ENT: No sore throat. Cardiovascular: Positive for chest pain. Respiratory: Positive for shortness of breath. Gastrointestinal: No abdominal pain.  Positive for nausea and vomiting.  No diarrhea.  No constipation. Genitourinary: Negative for dysuria. Musculoskeletal: Negative for back pain. Skin: Negative for rash. Neurological: Negative for headaches, focal weakness or numbness.   ____________________________________________   PHYSICAL EXAM:  VITAL SIGNS: ED Triage Vitals  Enc Vitals Group     BP 07/12/18 2228 (!) 155/80     Pulse Rate 07/12/18 2228 (!) 104     Resp 07/12/18 2228 (!) 24     Temp 07/12/18 2228 98.6 F (37 C)     Temp Source 07/12/18 2228 Oral     SpO2 07/12/18 2228 96 %     Weight 07/12/18 2231 168 lb (76.2 kg)     Height 07/12/18 2231 5' (1.524 m)     Head Circumference --      Peak Flow --      Pain Score 07/12/18 2229 7     Pain Loc --      Pain Edu? --      Excl. in [place]? --     Constitutional: Alert and oriented. Well appearing and in mild acute distress. Eyes: Conjunctivae are normal. PERRL. EOMI. Head: Atraumatic. Nose: No congestion/rhinnorhea. Mouth/Throat: Mucous membranes are moist.  Oropharynx non-erythematous. Neck: No stridor.   Cardiovascular: Normal rate, regular rhythm. I/VI SEM.  Good peripheral circulation. Respiratory: Increased respiratory effort.  No retractions. Lungs with slight wheezing. Gastrointestinal: Soft and nontender. No distention. No abdominal bruits. No CVA tenderness. Musculoskeletal: No lower extremity tenderness nor edema.  No joint effusions. Neurologic:  Normal speech and language. No gross focal neurologic deficits are appreciated.  Skin:  Skin is warm, dry and intact. No rash noted. Psychiatric: Mood and affect are normal. Speech and behavior are  normal.  ____________________________________________   LABS (all labs ordered are listed, but only abnormal results are displayed)  Labs Reviewed  BASIC METABOLIC PANEL - Abnormal; Notable for the following components:      Result Value   Glucose, Bld 117 (*)    BUN 27 (*)    All other components within normal limits  CBC - Abnormal; Notable for the following components:   WBC 12.0 (*)    RBC 5.23 (*)    MCV 79.7 (*)    RDW 16.5 (*)    All other components within normal limits  TROPONIN I  BRAIN NATRIURETIC PEPTIDE   ____________________________________________  EKG  ED ECG REPORT I, Joshia Kitchings J, the attending physician, personally viewed and interpreted this ECG.   Date: 07/13/2018  EKG Time: 2228  Rate:  104  Rhythm: sinus tachycardia  Axis: Normal  Intervals:none  ST&T Change: Nonspecific  ____________________________________________  RADIOLOGY  ED MD interpretation: Pulmonary vascular congestion; hiatal hernia  Official radiology report(s): Dg Chest 2 View  Result Date: 07/12/2018 CLINICAL DATA:  Chest pain EXAM: CHEST - 2 VIEW COMPARISON:  06/01/2011, CT 06/02/2011 FINDINGS: Cardiomegaly with vascular congestion. Low lung volumes. No focal airspace disease or significant pleural effusion. Moderate to large hiatal hernia. Aortic atherosclerosis. No pneumothorax. IMPRESSION: Cardiomegaly with vascular congestion. Moderate to large hiatal hernia Electronically Signed   By: Donavan Foil M.D.   On: 07/12/2018 23:08    ____________________________________________   PROCEDURES  Procedure(s) performed: None  Procedures  Critical Care performed: No  ____________________________________________   INITIAL IMPRESSION / ASSESSMENT AND PLAN / ED COURSE  As part of my medical decision making, I reviewed the following data within the [place] History obtained from family, Nursing notes reviewed and incorporated, Labs reviewed, EKG interpreted,  Old chart reviewed, Radiograph reviewed, Discussed with admitting physician and Notes from prior ED visits   84 year old female with CAD status post MI, COPD who presents with chest pain. Differential diagnosis includes, but is not limited to, ACS, aortic dissection, pulmonary embolism, cardiac tamponade, pneumothorax, pneumonia, pericarditis, myocarditis, GI-related causes including esophagitis/gastritis, and musculoskeletal chest wall pain.    Initial troponin unremarkable.  Will administer 125mg  IV Solu-Medrol, DuoNeb for COPD exacerbation with wheezing.   20mg  IV Lasix for diuresis.  Patient was taken off baby aspirin by her PCP for GI bleed.  For this reason, we will hold.  Administer nitroglycerin paste.  Discussed with hospitalist who will evaluate patient in the emergency department for admission.      ____________________________________________   FINAL CLINICAL IMPRESSION(S) / ED DIAGNOSES  Final diagnoses:  Unstable angina (HCC)  Essential hypertension  Chronic obstructive pulmonary disease with acute exacerbation [facility])  Pulmonary vascular congestion     ED Discharge Orders    None       Note:  This document was prepared using Dragon voice recognition software and may include unintentional dictation errors.    Paulette Blanch, MD 07/13/18 [phone]

## 2018-07-12 NOTE — ED Notes (Signed)
Patient transported to X-ray 

## 2018-07-13 ENCOUNTER — Inpatient Hospital Stay
Admit: 2018-07-13 | Discharge: 2018-07-13 | Disposition: A | Discharge status: Home / Self Care | Payer: Medicare Other | Attending: Internal Medicine | Admitting: Internal Medicine

## 2018-07-13 ENCOUNTER — Other Ambulatory Visit: Payer: Self-pay

## 2018-07-13 DIAGNOSIS — I739 Peripheral vascular disease, unspecified: Secondary | ICD-10-CM | POA: Diagnosis not present

## 2018-07-13 DIAGNOSIS — R079 Chest pain, unspecified: Secondary | ICD-10-CM | POA: Diagnosis present

## 2018-07-13 DIAGNOSIS — Z961 Presence of intraocular lens: Secondary | ICD-10-CM | POA: Diagnosis not present

## 2018-07-13 DIAGNOSIS — Z96641 Presence of right artificial hip joint: Secondary | ICD-10-CM | POA: Diagnosis not present

## 2018-07-13 DIAGNOSIS — Z791 Long term (current) use of non-steroidal anti-inflammatories (NSAID): Secondary | ICD-10-CM | POA: Diagnosis not present

## 2018-07-13 DIAGNOSIS — R0989 Other specified symptoms and signs involving the circulatory and respiratory systems: Secondary | ICD-10-CM | POA: Diagnosis not present

## 2018-07-13 DIAGNOSIS — Z9842 Cataract extraction status, left eye: Secondary | ICD-10-CM | POA: Diagnosis not present

## 2018-07-13 DIAGNOSIS — I252 Old myocardial infarction: Secondary | ICD-10-CM | POA: Diagnosis not present

## 2018-07-13 DIAGNOSIS — Z7902 Long term (current) use of antithrombotics/antiplatelets: Secondary | ICD-10-CM | POA: Diagnosis not present

## 2018-07-13 DIAGNOSIS — I5031 Acute diastolic (congestive) heart failure: Secondary | ICD-10-CM | POA: Diagnosis not present

## 2018-07-13 DIAGNOSIS — Z79899 Other long term (current) drug therapy: Secondary | ICD-10-CM | POA: Diagnosis not present

## 2018-07-13 DIAGNOSIS — I110 Hypertensive heart disease with heart failure: Secondary | ICD-10-CM | POA: Diagnosis not present

## 2018-07-13 DIAGNOSIS — K219 Gastro-esophageal reflux disease without esophagitis: Secondary | ICD-10-CM | POA: Diagnosis not present

## 2018-07-13 DIAGNOSIS — Z9861 Coronary angioplasty status: Secondary | ICD-10-CM | POA: Diagnosis not present

## 2018-07-13 DIAGNOSIS — J441 Chronic obstructive pulmonary disease with (acute) exacerbation: Secondary | ICD-10-CM | POA: Diagnosis not present

## 2018-07-13 DIAGNOSIS — K449 Diaphragmatic hernia without obstruction or gangrene: Secondary | ICD-10-CM | POA: Diagnosis not present

## 2018-07-13 DIAGNOSIS — Z87891 Personal history of nicotine dependence: Secondary | ICD-10-CM | POA: Diagnosis not present

## 2018-07-13 DIAGNOSIS — Z90710 Acquired absence of both cervix and uterus: Secondary | ICD-10-CM | POA: Diagnosis not present

## 2018-07-13 DIAGNOSIS — I25110 Atherosclerotic heart disease of native coronary artery with unstable angina pectoris: Secondary | ICD-10-CM | POA: Diagnosis not present

## 2018-07-13 DIAGNOSIS — M1990 Unspecified osteoarthritis, unspecified site: Secondary | ICD-10-CM | POA: Diagnosis not present

## 2018-07-13 DIAGNOSIS — Z9841 Cataract extraction status, right eye: Secondary | ICD-10-CM | POA: Diagnosis not present

## 2018-07-13 DIAGNOSIS — R0603 Acute respiratory distress: Secondary | ICD-10-CM | POA: Diagnosis not present

## 2018-07-13 LAB — GLUCOSE, CAPILLARY
GLUCOSE-CAPILLARY: 154 mg/dL — AB (ref 70–99)
Glucose-Capillary: 180 mg/dL — ABNORMAL HIGH (ref 70–99)
Glucose-Capillary: 133 mg/dL — ABNORMAL HIGH (ref 70–99)

## 2018-07-13 LAB — CBC
HEMATOCRIT: 40.3 % (ref 35.0–47.0)
HEMOGLOBIN: 13 g/dL (ref 12.0–16.0)
MCH: 25.7 pg — ABNORMAL LOW (ref 26.0–34.0)
MCHC: 32.2 g/dL (ref 32.0–36.0)
MCV: 79.8 fL — ABNORMAL LOW (ref 80.0–100.0)
Platelets: 142 10*3/uL — ABNORMAL LOW (ref 150–440)
RBC: 5.05 MIL/uL (ref 3.80–5.20)
RDW: 16.3 % — ABNORMAL HIGH (ref 11.5–14.5)
WBC: 17.6 10*3/uL — ABNORMAL HIGH (ref 3.6–11.0)

## 2018-07-13 LAB — ECHOCARDIOGRAM COMPLETE
HEIGHTINCHES: 59 in
WEIGHTICAEL: 2668.8 [oz_av]

## 2018-07-13 LAB — TROPONIN I: Troponin I: <0.03 ng/mL (ref <0.03)

## 2018-07-13 LAB — BASIC METABOLIC PANEL
ANION GAP: 10 (ref 5–15)
BUN: 20 mg/dL (ref 8–23)
CALCIUM: 9.2 mg/dL (ref 8.9–10.3)
CO2: 27 mmol/L (ref 22–32)
Chloride: 104 mmol/L (ref 98–111)
Creatinine, Ser: 0.73 mg/dL (ref 0.44–1.00)
GLUCOSE: 186 mg/dL — AB (ref 70–99)
POTASSIUM: 2.7 mmol/L — AB (ref 3.5–5.1)
Sodium: 141 mmol/L (ref 135–145)

## 2018-07-13 LAB — BRAIN NATRIURETIC PEPTIDE: B NATRIURETIC PEPTIDE 5: 198 pg/mL — AB (ref 0.0–100.0)

## 2018-07-13 MED ORDER — SIMVASTATIN 20 MG PO TABS
40.0000 mg | ORAL_TABLET | Freq: Every day | ORAL | Status: DC
  Administered 2018-07-13 – 2018-07-14 (×2): 40 mg via ORAL
  Filled 2018-07-13 (×2): qty 2

## 2018-07-13 MED ORDER — POTASSIUM CHLORIDE CRYS ER 20 MEQ PO TBCR
40.0000 meq | EXTENDED_RELEASE_TABLET | Freq: Two times a day (BID) | ORAL | Status: AC
  Administered 2018-07-13 (×2): 40 meq via ORAL
  Filled 2018-07-13 (×2): qty 2

## 2018-07-13 MED ORDER — HYDRALAZINE HCL 20 MG/ML IJ SOLN: 5.0000 mg | Freq: Four times a day (QID) | INTRAMUSCULAR | Status: DC | PRN

## 2018-07-13 MED ORDER — VITAMIN D3 25 MCG (1000 UNIT) PO TABS
1000.0000 [IU] | ORAL_TABLET | Freq: Every morning | ORAL | Status: DC
  Administered 2018-07-13 – 2018-07-14 (×2): 1000 [IU] via ORAL
  Filled 2018-07-13 (×2): qty 1

## 2018-07-13 MED ORDER — ISOSORBIDE DINITRATE 30 MG PO TABS
30.0000 mg | ORAL_TABLET | Freq: Two times a day (BID) | ORAL | Status: DC
  Administered 2018-07-13 – 2018-07-14 (×3): 30 mg via ORAL
  Filled 2018-07-13 (×5): qty 1

## 2018-07-13 MED ORDER — GLYCERIN-HYPROMELLOSE-PEG 400 0.2-0.2-1 % OP SOLN
1.0000 [drp] | Freq: Four times a day (QID) | OPHTHALMIC | Status: DC | PRN
  Administered 2018-07-13 – 2018-07-14 (×2): 1 [drp] via OPHTHALMIC
  Filled 2018-07-13 (×2): qty 15

## 2018-07-13 MED ORDER — ACETAMINOPHEN 325 MG PO TABS
650.0000 mg | ORAL_TABLET | Freq: Four times a day (QID) | ORAL | Status: DC | PRN
  Administered 2018-07-14: 650 mg via ORAL
  Filled 2018-07-13: qty 2

## 2018-07-13 MED ORDER — IPRATROPIUM-ALBUTEROL 20-100 MCG/ACT IN AERS: 1.0000 | INHALATION_SPRAY | Freq: Four times a day (QID) | RESPIRATORY_TRACT | Status: DC

## 2018-07-13 MED ORDER — FUROSEMIDE 10 MG/ML IJ SOLN
20.0000 mg | Freq: Once | INTRAMUSCULAR | Status: AC
  Administered 2018-07-13: 20 mg via INTRAVENOUS
  Filled 2018-07-13: qty 4

## 2018-07-13 MED ORDER — FUROSEMIDE 10 MG/ML IJ SOLN
20.0000 mg | Freq: Two times a day (BID) | INTRAMUSCULAR | Status: DC
  Administered 2018-07-13: 20 mg via INTRAVENOUS
  Filled 2018-07-13: qty 2

## 2018-07-13 MED ORDER — ACETAMINOPHEN 650 MG RE SUPP: 650.0000 mg | Freq: Four times a day (QID) | RECTAL | Status: DC | PRN

## 2018-07-13 MED ORDER — HYDROCODONE-ACETAMINOPHEN 5-325 MG PO TABS: 1.0000 | ORAL_TABLET | ORAL | Status: DC | PRN

## 2018-07-13 MED ORDER — IPRATROPIUM-ALBUTEROL 0.5-2.5 (3) MG/3ML IN SOLN
3.0000 mL | Freq: Four times a day (QID) | RESPIRATORY_TRACT | Status: DC
  Administered 2018-07-13 (×3): 3 mL via RESPIRATORY_TRACT
  Filled 2018-07-13 (×3): qty 3

## 2018-07-13 MED ORDER — PANTOPRAZOLE SODIUM 40 MG PO TBEC
40.0000 mg | DELAYED_RELEASE_TABLET | Freq: Every day | ORAL | Status: DC
  Administered 2018-07-13 – 2018-07-14 (×2): 40 mg via ORAL
  Filled 2018-07-13 (×2): qty 1

## 2018-07-13 MED ORDER — ONDANSETRON HCL 4 MG/2ML IJ SOLN: 4.0000 mg | Freq: Four times a day (QID) | INTRAMUSCULAR | Status: DC | PRN

## 2018-07-13 MED ORDER — DOCUSATE SODIUM 100 MG PO CAPS
100.0000 mg | ORAL_CAPSULE | Freq: Two times a day (BID) | ORAL | Status: DC
  Administered 2018-07-13 – 2018-07-14 (×3): 100 mg via ORAL
  Filled 2018-07-13 (×3): qty 1

## 2018-07-13 MED ORDER — LISINOPRIL 5 MG PO TABS
5.0000 mg | ORAL_TABLET | Freq: Every day | ORAL | Status: DC
  Administered 2018-07-13 – 2018-07-14 (×2): 5 mg via ORAL
  Filled 2018-07-13 (×2): qty 1

## 2018-07-13 MED ORDER — IPRATROPIUM-ALBUTEROL 0.5-2.5 (3) MG/3ML IN SOLN
3.0000 mL | Freq: Two times a day (BID) | RESPIRATORY_TRACT | Status: DC
  Administered 2018-07-13 – 2018-07-14 (×2): 3 mL via RESPIRATORY_TRACT
  Filled 2018-07-13 (×2): qty 3

## 2018-07-13 MED ORDER — HEPARIN SODIUM (PORCINE) 5000 UNIT/ML IJ SOLN
5000.0000 [IU] | Freq: Three times a day (TID) | INTRAMUSCULAR | Status: DC
  Administered 2018-07-13 – 2018-07-14 (×4): 5000 [IU] via SUBCUTANEOUS
  Filled 2018-07-13 (×4): qty 1

## 2018-07-13 MED ORDER — NITROGLYCERIN 0.4 MG SL SUBL
0.4000 mg | SUBLINGUAL_TABLET | SUBLINGUAL | Status: DC | PRN
  Administered 2018-07-13 (×2): 0.4 mg via SUBLINGUAL
  Filled 2018-07-13: qty 1

## 2018-07-13 MED ORDER — HYPROMELLOSE (GONIOSCOPIC) 2.5 % OP SOLN: 1.0000 [drp] | Freq: Four times a day (QID) | OPHTHALMIC | Status: DC | PRN

## 2018-07-13 MED ORDER — BISACODYL 5 MG PO TBEC: 5.0000 mg | DELAYED_RELEASE_TABLET | Freq: Every day | ORAL | Status: DC | PRN

## 2018-07-13 MED ORDER — METHYLPREDNISOLONE SODIUM SUCC 125 MG IJ SOLR
125.0000 mg | Freq: Once | INTRAMUSCULAR | Status: AC
  Administered 2018-07-13: 125 mg via INTRAVENOUS
  Filled 2018-07-13: qty 2

## 2018-07-13 MED ORDER — FUROSEMIDE 10 MG/ML IJ SOLN: 20.0000 mg | Freq: Two times a day (BID) | INTRAMUSCULAR | Status: DC

## 2018-07-13 MED ORDER — IPRATROPIUM-ALBUTEROL 0.5-2.5 (3) MG/3ML IN SOLN
3.0000 mL | Freq: Once | RESPIRATORY_TRACT | Status: AC
  Administered 2018-07-13: 3 mL via RESPIRATORY_TRACT
  Filled 2018-07-13: qty 3

## 2018-07-13 MED ORDER — METOPROLOL SUCCINATE ER 50 MG PO TB24
75.0000 mg | ORAL_TABLET | Freq: Every day | ORAL | Status: DC
  Administered 2018-07-13: 25 mg via ORAL
  Administered 2018-07-14: 75 mg via ORAL
  Filled 2018-07-13 (×2): qty 1

## 2018-07-13 MED ORDER — TRAZODONE HCL 50 MG PO TABS
25.0000 mg | ORAL_TABLET | Freq: Every evening | ORAL | Status: DC | PRN
  Administered 2018-07-13: 25 mg via ORAL
  Filled 2018-07-13: qty 1

## 2018-07-13 MED ORDER — ONDANSETRON HCL 4 MG PO TABS: 4.0000 mg | ORAL_TABLET | Freq: Four times a day (QID) | ORAL | Status: DC | PRN

## 2018-07-13 NOTE — Progress Notes (Signed)
*  PRELIMINARY RESULTS* Echocardiogram 2D Echocardiogram has been performed.  Sherrie Sport 07/13/2018, 3:29 PM

## 2018-07-13 NOTE — Progress Notes (Addendum)
Pt complaining of 5/10 back and chest pain. Pt describes this pain as a "dullness". Prime doc notified. Per orders, EKG obtained. Stat troponin ordered. Will give nitro for pain relief. Will continue to monitor.   Blood pressure 157/81. 1 SL nitro given. Will assess chest pain in 5 minutes.   Pt states chest pain has gotten better but still rates pain as a 4/10. Will give another SL nitro. BP 143/77  2148: Pt states chest pain has been relieved. BP 147/81

## 2018-07-13 NOTE — H&P (Signed)
[facility] NAME: Ashley [name]    MR#:  [id number]  DATE OF BIRTH:  12/07/1930  DATE OF ADMISSION:  07/12/2018  PRIMARY CARE PHYSICIAN: Albina Billet, MD   REQUESTING/REFERRING PHYSICIAN:   CHIEF COMPLAINT:   Chief Complaint  Patient presents with  . Chest Pain    HISTORY OF PRESENT ILLNESS: Ashley [name]  is a 84 y.o. female with a known history of coronary artery disease status post MI in the past, COPD, PAD, hypertension, anemia and other comorbidities. Patient presented to emergency room for acute onset of constant, moderate retrosternal chest tightness going on for the past 6 hours.  The chest pain does not radiate anywhere, but is associated with diaphoresis, shortness of breath and nausea.  Patient took a pain pill at home, which made the chest pain better for only a couple of hours.  No fever, chills, no abdominal pain, no diarrhea, no bleeding. Blood test done emergency room are notable for elevated BNP at 198 and slightly elevated WBC of 12. EKG shows sinus tachycardia, with rate 104 bpm, no acute ischemic changes. Chest x-ray shows cardiomegaly with vascular congestion. Patient is admitted for further evaluation and treatment.   PAST MEDICAL HISTORY:   Past Medical History:  Diagnosis Date  . Anemia   . Anxiety   . Arthritis   . COPD (chronic obstructive pulmonary disease) ([place])   . Coronary artery disease   . Edema    feet/ankles   wears support hose  . GERD (gastroesophageal reflux disease)   . GI (gastrointestinal bleed)    history  . Hypertension   . Myocardial infarction ([place])   . Peripheral vascular disease ([place])   . Shortness of breath dyspnea     PAST SURGICAL HISTORY:  Past Surgical History:  Procedure Laterality Date  . ABDOMINAL HYSTERECTOMY    . ABLATION     right leg less than 1 year ago  . CATARACT EXTRACTION W/ INTRAOCULAR LENS  IMPLANT, BILATERAL Bilateral   . CORONARY ANGIOPLASTY      stents  . EYE SURGERY    . FRACTURE SURGERY     right wrist  . TOTAL HIP ARTHROPLASTY Right 02/20/2016   Procedure: TOTAL HIP ARTHROPLASTY ANTERIOR APPROACH;  Surgeon: Hessie Knows, MD;  Location: [facility] ORS;  Service: Orthopedics;  Laterality: Right;    SOCIAL HISTORY:  Social History   Tobacco Use  . Smoking status: Former Smoker    Last attempt to quit: 02/11/1997    Years since quitting: 21.4  . Smokeless tobacco: Never Used  Substance Use Topics  . Alcohol use: No    FAMILY HISTORY: Blood pressure in mother.  DRUG ALLERGIES: No Known Allergies  REVIEW OF SYSTEMS:   CONSTITUTIONAL: No fever, fatigue or weakness.  EYES: No changes in vision.  EARS, NOSE, AND THROAT: No tinnitus or ear pain.  RESPIRATORY: Positive for shortness of breath.  No cough, wheezing or hemoptysis.  CARDIOVASCULAR: Positive for chest pain, no orthopnea, edema.  GASTROINTESTINAL: No nausea, vomiting, diarrhea or abdominal pain.  GENITOURINARY: No dysuria, hematuria.  ENDOCRINE: No polyuria, nocturia. HEMATOLOGY: No bleeding. SKIN: No rash or lesion. MUSCULOSKELETAL: No joint pain at this time.   NEUROLOGIC: No focal weakness.  PSYCHIATRY: No anxiety or depression.   MEDICATIONS AT HOME:  Prior to Admission medications   Medication Sig Start Date End Date Taking? Authorizing Provider  ALPRAZolam (XANAX) 0.25 MG tablet Take 0.25 mg by mouth 2 (two) times  daily. As needed usually just at bedtime    [provider]  amLODipine (NORVASC) 10 MG tablet Take 10 mg by mouth daily.    [provider]  Cholecalciferol (VITAMIN D-3 PO) Take by mouth.    [provider]  Cyanocobalamin (VITAMIN B-12 CR PO) Take by mouth.    [provider]  enoxaparin (LOVENOX) 40 MG/0.4ML injection Inject 0.4 mLs (40 mg total) into the skin daily. 02/21/16   Duanne Guess, PA-C  ibuprofen (ADVIL,MOTRIN) 200 MG tablet Take 200 mg by mouth 3 (three) times daily.    [provider]  Ipratropium-Albuterol (COMBIVENT RESPIMAT) 20-100 MCG/ACT AERS respimat Inhale 1 puff into the lungs every 6 (six) hours.    [provider]  isosorbide dinitrate (ISORDIL) 30 MG tablet Take 30 mg by mouth 2 (two) times daily.    [provider]  lisinopril (PRINIVIL,ZESTRIL) 10 MG tablet Take 5 mg by mouth daily.    [provider]  metoprolol succinate (TOPROL-XL) 50 MG 24 hr tablet Take 75 mg by mouth daily. Take with or immediately following a meal.    [provider]  omeprazole (PRILOSEC) 20 MG capsule Take 20 mg by mouth daily.    [provider]  oxyCODONE (OXY IR/ROXICODONE) 5 MG immediate release tablet Take 1-2 tablets (5-10 mg total) by mouth every 3 (three) hours as needed for breakthrough pain. 02/21/16   Duanne Guess, PA-C  simvastatin (ZOCOR) 40 MG tablet Take 40 mg by mouth daily.    [provider]  traMADol (ULTRAM) 50 MG tablet Take 1 tablet (50 mg total) by mouth every 6 (six) hours as needed. 02/21/16   Duanne Guess, PA-C      PHYSICAL EXAMINATION:   VITAL SIGNS: Blood pressure (!) 174/87, pulse 100, temperature 98.6 F (37 C), temperature source Oral, resp. rate (!) 34, height 5' (1.524 m), weight 76.2 kg, SpO2 94 %.  GENERAL:  84 y.o.-year-old patient lying in the bed with moderate respiratory distress.  EYES: Pupils equal, round, reactive to light and accommodation. No scleral icterus. Extraocular muscles intact.  HEENT: Head atraumatic, normocephalic. Oropharynx and nasopharynx clear.  NECK:  Supple, no jugular venous distention. No thyroid enlargement, no tenderness.  LUNGS: Reduced breath sounds and scattered wheezing noted bilaterally.  Minimal use of accessory muscles of respiration.  CARDIOVASCULAR: S1, S2 normal. No S3/S4.  ABDOMEN: Soft, nontender, nondistended. Bowel sounds present. No organomegaly or mass.  EXTREMITIES: No pedal edema, cyanosis, or clubbing.  NEUROLOGIC: No focal  weakness. PSYCHIATRIC: The patient is alert and oriented x 3.  SKIN: No obvious rash, lesion, or ulcer.   LABORATORY PANEL:   CBC Recent Labs  Lab 07/12/18 2235  WBC 12.0*  HGB 13.7  HCT 41.7  PLT 156  MCV 79.7*  MCH 26.1  MCHC 32.8  RDW 16.5*   ------------------------------------------------------------------------------------------------------------------  Chemistries  Recent Labs  Lab 07/12/18 2235  NA 140  K 3.6  CL 106  CO2 25  GLUCOSE 117*  BUN 27*  CREATININE 0.72  CALCIUM 9.4   ------------------------------------------------------------------------------------------------------------------ estimated creatinine clearance is 47.8 mL/min (by C-G formula based on SCr of 0.72 mg/dL). ------------------------------------------------------------------------------------------------------------------ No results for input(s): TSH, T4TOTAL, T3FREE, THYROIDAB in the last 72 hours.  Invalid input(s): FREET3   Coagulation profile No results for input(s): INR, PROTIME in the last 168 hours. ------------------------------------------------------------------------------------------------------------------- No results for input(s): DDIMER in the last 72 hours. -------------------------------------------------------------------------------------------------------------------  Cardiac Enzymes Recent Labs  Lab 07/12/18 2235  TROPONINI <0.03   ------------------------------------------------------------------------------------------------------------------  Invalid input(s): POCBNP  ---------------------------------------------------------------------------------------------------------------  Urinalysis    Component Value Date/Time   COLORURINE YELLOW (A) 02/22/2016 0313   APPEARANCEUR CLEAR (A) 02/22/2016 0313   LABSPEC 1.008 02/22/2016 0313   PHURINE 6.0 02/22/2016 0313   GLUCOSEU NEGATIVE 02/22/2016 0313   HGBUR 1+ (A) 02/22/2016 0313   BILIRUBINUR NEGATIVE  02/22/2016 0313   KETONESUR NEGATIVE 02/22/2016 0313   PROTEINUR NEGATIVE 02/22/2016 0313   NITRITE NEGATIVE 02/22/2016 0313   LEUKOCYTESUR NEGATIVE 02/22/2016 0313     RADIOLOGY: Dg Chest 2 View  Result Date: 07/12/2018 CLINICAL DATA:  Chest pain EXAM: CHEST - 2 VIEW COMPARISON:  06/01/2011, CT 06/02/2011 FINDINGS: Cardiomegaly with vascular congestion. Low lung volumes. No focal airspace disease or significant pleural effusion. Moderate to large hiatal hernia. Aortic atherosclerosis. No pneumothorax. IMPRESSION: Cardiomegaly with vascular congestion. Moderate to large hiatal hernia Electronically Signed   By: Donavan Foil M.D.   On: 07/12/2018 23:08    EKG: Orders placed or performed during the hospital encounter of 07/12/18  . EKG 12-Lead  . EKG 12-Lead  . ED EKG within 10 minutes  . ED EKG within 10 minutes    IMPRESSION AND PLAN:  1.  Chest pain, will rule out ACS.  Continue to monitor patient on telemetry and follow troponin levels.  Will check 2D echo.  Cardiology is consulted for further evaluation and treatment.  2.  Acute respiratory distress, likely multifactorial secondary to COPD exacerbation and pulmonary vascular congestion.  Will treat with Lasix IV, nebulizer treatment, oxygen and steroids IV.  Will check 2D echo for further evaluation of cardiac function.    3.  Acute COPD exacerbation, see treatment as above under #2.  4.  Hypertension, stable, continue home medications.  All the records are reviewed and case discussed with ED provider. Management plans discussed with the patient, family and they are in agreement.  CODE STATUS: Full Advance Directive Documentation     Most Recent Value  Type of Advance Directive  Healthcare Power of Attorney, Living will  Pre-existing out of facility DNR order (yellow form or pink MOST form)  -  "MOST" Form in Place?  -       TOTAL TIME TAKING CARE OF THIS PATIENT: 50 minutes.    Amelia Jo M.D on 07/13/2018 at  12:45 AM  Between 7am to 6pm - Pager - [phone]  After 6pm go to [email or web address] - password [email or web address] [facility] [place] [facility]  [phone]  CC: Primary care physician; Albina Billet, MD

## 2018-07-13 NOTE — Progress Notes (Addendum)
CRITICAL VALUE ALERT  Critical Value: Potassium 2.7  Date & Time Notied:  07/13/18 @0704   Provider Notified: Dr. Rozann Lesches  Orders Received/Actions taken: 35mEq K-Dur scheduled to give at 16

## 2018-07-13 NOTE — ED Notes (Signed)
Pt ambulatory to toilet with one assist.  

## 2018-07-13 NOTE — Plan of Care (Signed)

## 2018-07-13 NOTE — ED Notes (Signed)
Lab notified to add on BNP 

## 2018-07-13 NOTE — ED Notes (Signed)
Hospitalist to bedside at this time 

## 2018-07-13 NOTE — Progress Notes (Signed)
[facility] [place] NAME: Ashley [name]    MR#:  [id number]  DATE OF BIRTH:  1931/08/24  SUBJECTIVE:  patient came in from home with increasing shortness of breath and chest tightness. She is out in the chair. She feels a lot better. Family in the room. Denies any chest pain.  REVIEW OF SYSTEMS:   Review of Systems  Constitutional: Negative for chills, fever and weight loss.  HENT: Negative for ear discharge, ear pain and nosebleeds.   Eyes: Negative for blurred vision, pain and discharge.  Respiratory: Negative for sputum production, shortness of breath, wheezing and stridor.   Cardiovascular: Negative for chest pain, palpitations, orthopnea and PND.  Gastrointestinal: Negative for abdominal pain, diarrhea, nausea and vomiting.  Genitourinary: Negative for frequency and urgency.  Musculoskeletal: Negative for back pain and joint pain.  Neurological: Negative for sensory change, speech change, focal weakness and weakness.  Psychiatric/Behavioral: Negative for depression and hallucinations. The patient is not nervous/anxious.    Tolerating Diet:yes Tolerating PT: able to ambulate at home by herself  DRUG ALLERGIES:  No Known Allergies  VITALS:  Blood pressure (!) 145/69, pulse 87, temperature 97.7 F (36.5 C), temperature source Oral, resp. rate 18, height 4\' 11"  (1.499 m), weight 75.7 kg, SpO2 93 %.  PHYSICAL EXAMINATION:   Physical Exam  GENERAL:  84 y.o.-year-old patient lying in the bed with no acute distress.  EYES: Pupils equal, round, reactive to light and accommodation. No scleral icterus. Extraocular muscles intact.  HEENT: Head atraumatic, normocephalic. Oropharynx and nasopharynx clear.  NECK:  Supple, no jugular venous distention. No thyroid enlargement, no tenderness.  LUNGS: Normal breath sounds bilaterally, no wheezing, rales, rhonchi. No use of accessory muscles of respiration.  CARDIOVASCULAR: S1, S2 normal.  No murmurs, rubs, or gallops.  ABDOMEN: Soft, nontender, nondistended. Bowel sounds present. No organomegaly or mass.  EXTREMITIES: No cyanosis, clubbing or edema b/l.    NEUROLOGIC: Cranial nerves II through XII are intact. No focal Motor or sensory deficits b/l.   PSYCHIATRIC:  patient is alert and oriented x 3.  SKIN: No obvious rash, lesion, or ulcer.   LABORATORY PANEL:  CBC Recent Labs  Lab 07/13/18 0451  WBC 17.6*  HGB 13.0  HCT 40.3  PLT 142*    Chemistries  Recent Labs  Lab 07/13/18 0451  NA 141  K 2.7*  CL 104  CO2 27  GLUCOSE 186*  BUN 20  CREATININE 0.73  CALCIUM 9.2   Cardiac Enzymes Recent Labs  Lab 07/12/18 2235  TROPONINI <0.03   RADIOLOGY:  Dg Chest 2 View  Result Date: 07/12/2018 CLINICAL DATA:  Chest pain EXAM: CHEST - 2 VIEW COMPARISON:  06/01/2011, CT 06/02/2011 FINDINGS: Cardiomegaly with vascular congestion. Low lung volumes. No focal airspace disease or significant pleural effusion. Moderate to large hiatal hernia. Aortic atherosclerosis. No pneumothorax. IMPRESSION: Cardiomegaly with vascular congestion. Moderate to large hiatal hernia Electronically Signed   By: Donavan Foil M.D.   On: 07/12/2018 23:08   ASSESSMENT AND PLAN:  Ashley [name]  is a 84 y.o. female with a known history of coronary artery disease status post MI in the past, COPD, PAD, hypertension, anemia and other comorbidities. Patient presented to emergency room for acute onset of constant, moderate retrosternal chest tightness going on for the past 6 hours.   1.  Chest pain, will rule out ACS.  Continue to monitor patient on telemetry and follow troponin levels.   -Will check 2D echo.  -  Cardiology is consulted for further evaluation and treatment. -troponin x1 neg -patient feels a lot better. Normal chest pain. I assume this is due to pulmonary vascular congestion/CHF with mild COPD.  2.  Acute respiratory distress, likely multifactorial secondary to COPD exacerbation and  pulmonary vascular congestion. -  Will treat with Lasix IV, nebulizer treatment, oxygen and steroids IV. -  Will check 2D echo for further evaluation of cardiac function.   -patient received a large dose of steroid yesterday. She is currently not wheezing. Will use PRN nebs if needed. Stats are stable on room air. -She is euvolemic. - I will hold off on further IV Lasix dose.  3.  Acute COPD exacerbation, see treatment as above under #2.  4.  Hypertension, stable, continue home medications.  At home she is ambulatory by herself. She uses a cane when she goes out. Discussed with patient and daughter if she wants physical therapy. They are okay right now since patient is doing well. If Remains stable will discharge her tomorrow. Patient and family agreeable.  Case discussed with Care Management/Social Worker. Management plans discussed with the patient, family and they are in agreement.  CODE STATUS: full  DVT Prophylaxis: lovenox  TOTAL TIME TAKING CARE OF THIS PATIENT: *30* minutes.  >50% time spent on counselling and coordination of care  POSSIBLE D/C IN *1 DAYS, DEPENDING ON CLINICAL CONDITION.  Note: This dictation was prepared with Dragon dictation along with smaller phrase technology. Any transcriptional errors that result from this process are unintentional.  Fritzi Mandes M.D on 07/13/2018 at 12:12 PM  Between 7am to 6pm - Pager - [phone]  After 6pm go to [email or web address] - password [email or web address] [facility]  Office  [phone]  CC: Primary care physician; Albina Billet, MDPatient ID: Ashley [name], female   DOB: [birth year], 84 y.o.   MRN: [id number]

## 2018-07-13 NOTE — Care Management Note (Signed)
Case Management Note  Patient Details  Name: ITZAE MCCURDY MRN: [id number] Date of Birth: [birth year]  Subjective/Objective:      Patient from home and lives alone.  She does not have any services in the home.  She feels she does not need any at this time.  She has many friends that help her with transportation and visits.  She wears a life line at all times at home.  Uses a cane when going to church and has a walker if needs.  Currently on room air.  Independent with ADL's.  Denies difficulty affording or obtaining medications.  Current with PCP.  No needs identified at this time.              Action/Plan:   Expected Discharge Date:                  Expected Discharge Plan:  Home/Self Care  In-House Referral:     Discharge planning Services  CM Consult  Post Acute Care Choice:    Choice offered to:     DME Arranged:    DME Agency:     HH Arranged:    HH Agency:     Status of Service:  In process, will continue to follow  If discussed at Long Length of Stay Meetings, dates discussed:    Additional Comments:  Elza Rafter, RN 07/13/2018, 1:35 PM

## 2018-07-14 DIAGNOSIS — I110 Hypertensive heart disease with heart failure: Secondary | ICD-10-CM | POA: Diagnosis not present

## 2018-07-14 DIAGNOSIS — R079 Chest pain, unspecified: Secondary | ICD-10-CM | POA: Diagnosis not present

## 2018-07-14 LAB — GLUCOSE, CAPILLARY: GLUCOSE-CAPILLARY: 123 mg/dL — AB (ref 70–99)

## 2018-07-14 LAB — BASIC METABOLIC PANEL
Anion gap: 8 (ref 5–15)
BUN: 33 mg/dL — AB (ref 8–23)
CO2: 27 mmol/L (ref 22–32)
CREATININE: 0.8 mg/dL (ref 0.44–1.00)
Calcium: 8.8 mg/dL — ABNORMAL LOW (ref 8.9–10.3)
Chloride: 103 mmol/L (ref 98–111)
GFR calc Af Amer: >60 mL/min (ref >60)
Glucose, Bld: 134 mg/dL — ABNORMAL HIGH (ref 70–99)
Potassium: 4 mmol/L (ref 3.5–5.1)
SODIUM: 138 mmol/L (ref 135–145)

## 2018-07-14 MED ORDER — IBUPROFEN 200 MG PO TABS: 200.0000 mg | ORAL_TABLET | Freq: Three times a day (TID) | ORAL | 0 refills | Status: DC | PRN

## 2018-07-14 MED ORDER — NITROGLYCERIN 0.4 MG SL SUBL: 0.4000 mg | SUBLINGUAL_TABLET | SUBLINGUAL | 2 refills | Status: DC | PRN

## 2018-07-14 NOTE — Discharge Summary (Signed)
Ashley [name] at [place] NAME: Ashley [name]    MR#:  [id number]  DATE OF BIRTH:  [birth year]  DATE OF ADMISSION:  07/12/2018 ADMITTING PHYSICIAN: Amelia Jo, MD  DATE OF DISCHARGE: 07/14/2018  PRIMARY CARE PHYSICIAN: Albina Billet, MD    ADMISSION DIAGNOSIS:  Unstable angina ([place]) [I20.0] Pulmonary vascular congestion [R09.89] Chronic obstructive pulmonary disease with acute exacerbation (HCC) [J44.1] Essential hypertension [I10]  DISCHARGE DIAGNOSIS:  Acute CHF mild diastolic and COPD--resolved Unstable angina ruled out MI  SECONDARY DIAGNOSIS:   Past Medical History:  Diagnosis Date  . Anemia   . Anxiety   . Arthritis   . COPD (chronic obstructive pulmonary disease) ([place])   . Coronary artery disease   . Edema    feet/ankles   wears support hose  . GERD (gastroesophageal reflux disease)   . GI (gastrointestinal bleed)    history  . Hypertension   . Myocardial infarction ([place])   . Peripheral vascular disease ([place])   . Shortness of breath dyspnea     HOSPITAL COURSE:  NevaGuthrieis 84 y.o.femalewith a known history of coronary artery disease status post MI in the past, COPD, PAD, hypertension, anemia and other comorbidities. Patient presented to emergency room for acute onset of constant,moderate retrosternal chest tightness going on for the past 6 hours.   1.Chest pain,will rule out ACS. Continue to monitor patient on telemetry and follow troponin levels. -Echo showed- Normal LVF   Normal Wall motion   EF=60%   Normal Right side. Normal study. No cardiac source of emboli was   indentified. -Cardiology is consult appreciated--recommends out pt stress test/ f/u Dr Ubaldo Glassing -troponin x3  neg -patient feels a lot better. No chest pain. I assume this is due to pulmonary vascular congestion/CHF with mild COPD.  2.Acute respiratory distress,likely multifactorial secondary to COPD exacerbation and  pulmonary vascular congestion mild diastolic CHF -recieved Lasix IV, nebulizer treatment, oxygen and steroids IV x1 in ER  -patient received a large dose of steroid yesterday. She is currently not wheezing. Will use PRN nebs if needed. Sats are stable on room air. -She is euvolemic. - I will hold off on further IV Lasix dose--resume home dose of lasix  3.Acute COPD exacerbation,see treatment as above under #2.  4.Hypertension, stable, continue home medications.  At home she is ambulatory by herself. She uses a cane when she goes out. Discussed with patient and daughter if she wants physical therapy. They are okay right now since patient is doing well.  overall stable Patient and family agreeable for d/chome and f/u Dr Ubaldo Glassing as out pt and possible stress then.  CONSULTS OBTAINED:  Treatment Team:  Yolonda Kida, MD  DRUG ALLERGIES:  No Known Allergies  DISCHARGE MEDICATIONS:   Allergies as of 07/14/2018   No Known Allergies     Medication List    TAKE these medications   ALPRAZolam 0.25 MG tablet Commonly known as:  XANAX Take 0.25 mg by mouth 2 (two) times daily. As needed usually just at bedtime   amLODipine 10 MG tablet Commonly known as:  NORVASC Take 10 mg by mouth daily.   COMBIVENT RESPIMAT 20-100 MCG/ACT Aers respimat Generic drug:  Ipratropium-Albuterol Inhale 1 puff into the lungs every 6 (six) hours.   ibuprofen 200 MG tablet Commonly known as:  ADVIL,MOTRIN Take 1 tablet (200 mg total) by mouth every 8 (eight) hours as needed. What changed:    when to take this  reasons to  take this   isosorbide dinitrate 30 MG tablet Commonly known as:  ISORDIL Take 30 mg by mouth 2 (two) times daily.   lisinopril 10 MG tablet Commonly known as:  PRINIVIL,ZESTRIL Take 5 mg by mouth daily.   metoprolol succinate 50 MG 24 hr tablet Commonly known as:  TOPROL-XL Take 75 mg by mouth daily. Take with or immediately following a meal.   nitroGLYCERIN  0.4 MG SL tablet Commonly known as:  NITROSTAT Place 1 tablet (0.4 mg total) under the tongue every 5 (five) minutes as needed for chest pain.   omeprazole 20 MG capsule Commonly known as:  PRILOSEC Take 20 mg by mouth daily.   simvastatin 40 MG tablet Commonly known as:  ZOCOR Take 40 mg by mouth daily.   VITAMIN B-12 CR PO Take by mouth.   VITAMIN D-3 PO Take by mouth.       If you experience worsening of your admission symptoms, develop shortness of breath, life threatening emergency, suicidal or homicidal thoughts you must seek medical attention immediately by calling 911 or calling your MD immediately  if symptoms less severe.  You Must read complete instructions/literature along with all the possible adverse reactions/side effects for all the Medicines you take and that have been prescribed to you. Take any new Medicines after you have completely understood and accept all the possible adverse reactions/side effects.   Please note  You were cared for by a hospitalist during your hospital stay. If you have any questions about your discharge medications or the care you received while you were in the hospital after you are discharged, you can call the unit and asked to speak with the hospitalist on call if the hospitalist that took care of you is not available. Once you are discharged, your primary care physician will handle any further medical issues. Please note that NO REFILLS for any discharge medications will be authorized once you are discharged, as it is imperative that you return to your primary care physician (or establish a relationship with a primary care physician if you do not have one) for your aftercare needs so that they can reassess your need for medications and monitor your lab values. Today   SUBJECTIVE   No new complaints Son in the room  VITAL SIGNS:  Blood pressure 136/65, pulse 94, temperature 97.8 F (36.6 C), temperature source Oral, resp. rate 20,  height 4\' 11"  (1.499 m), weight 75.1 kg, SpO2 95 %.  I/O:    Intake/Output Summary (Last 24 hours) at 07/14/2018 0800 Last data filed at 07/14/2018 0317 Gross per 24 hour  Intake -  Output 1150 ml  Net -1150 ml    PHYSICAL EXAMINATION:  GENERAL:  84 y.o.-year-old patient lying in the bed with no acute distress.  EYES: Pupils equal, round, reactive to light and accommodation. No scleral icterus. Extraocular muscles intact.  HEENT: Head atraumatic, normocephalic. Oropharynx and nasopharynx clear.  NECK:  Supple, no jugular venous distention. No thyroid enlargement, no tenderness.  LUNGS: Normal breath sounds bilaterally, no wheezing, rales,rhonchi or crepitation. No use of accessory muscles of respiration.  CARDIOVASCULAR: S1, S2 normal. No murmurs, rubs, or gallops.  ABDOMEN: Soft, non-tender, non-distended. Bowel sounds present. No organomegaly or mass.  EXTREMITIES: No pedal edema, cyanosis, or clubbing.  NEUROLOGIC: Cranial nerves II through XII are intact. Muscle strength 5/5 in all extremities. Sensation intact. Gait not checked.  PSYCHIATRIC: The patient is alert and oriented x 3.  SKIN: No obvious rash, lesion, or ulcer.  DATA REVIEW:   CBC  Recent Labs  Lab 07/13/18 0451  WBC 17.6*  HGB 13.0  HCT 40.3  PLT 142*    Chemistries  Recent Labs  Lab 07/14/18 0431  NA 138  K 4.0  CL 103  CO2 27  GLUCOSE 134*  BUN 33*  CREATININE 0.80  CALCIUM 8.8*    Microbiology Results   No results found for this or any previous visit (from the past 240 hour(s)).  RADIOLOGY:  Dg Chest 2 View  Result Date: 07/12/2018 CLINICAL DATA:  Chest pain EXAM: CHEST - 2 VIEW COMPARISON:  06/01/2011, CT 06/02/2011 FINDINGS: Cardiomegaly with vascular congestion. Low lung volumes. No focal airspace disease or significant pleural effusion. Moderate to large hiatal hernia. Aortic atherosclerosis. No pneumothorax. IMPRESSION: Cardiomegaly with vascular congestion. Moderate to large hiatal  hernia Electronically Signed   By: Donavan Foil M.D.   On: 07/12/2018 23:08     Management plans discussed with the patient, family and they are in agreement.  CODE STATUS:     Code Status Orders  (From admission, onward)         Start     Ordered   07/13/18 0219  Full code  Continuous     07/13/18 0218        Code Status History    This patient has a current code status but no historical code status.    Advance Directive Documentation     Most Recent Value  Type of Advance Directive  Healthcare Power of Attorney  Pre-existing out of facility DNR order (yellow form or pink MOST form)  -  "MOST" Form in Place?  -      TOTAL TIME TAKING CARE OF THIS PATIENT: *40* minutes.    Fritzi Mandes M.D on 07/14/2018 at 8:00 AM  Between 7am to 6pm - Pager - [phone] After 6pm go to [email or web address] - password [email or web address] [facility]  Office  [phone]  CC: Primary care physician; Albina Billet, MD

## 2018-07-14 NOTE — Progress Notes (Signed)
Patient alert and oriented, vss, no complaints of pain.  Agreeable to discharge.  Family at bedside.  D/c telemtry and PIV.  No questions.  F/u with [facility] and [facility].  Escorted out of hospital via wheelchair by volunteers.

## 2018-07-14 NOTE — Care Management (Signed)
  Patient placed as inpatient with chest pain.  Troponins are negative.  Patient presents from home and independent in all adls. No issues accessing medical care, obtaining medications, maintaining housing, utilities and food.  Has plenty of help with friends and family.  No discharge needs identified at present time.

## 2018-07-14 NOTE — Consult Note (Signed)
Reason for Consult: Chest pain angina shortness of breath Referring Physician: Dr. Lydia Guiles hospitalist Dr. Benita Stabile primary Cardiologist Bartholome Bill MD  Ashley [name] is an 84 y.o. female.  HPI: Patient presents with chest pain symptoms angina while at rest she says shortness of breath no peripheral vascular disease and hypertension patient describes of midsternal chest discomfort which is gotten progressively worse patient finally came to the emergency room for evaluation tightness is gone on retrosternal moderate in nature for over 6 hours.  There is no radiation of the pain but there was some mild diaphoresis shortness of breath and nausea no vomiting.  Patient took a pain pill for the chest discomfort no significant improvement BNP was 200 EKG was nondiagnostic but slightly tachycardic at 100 so she came to the emergency room for further assessment.  Currently the patient is pain-free usually follows with Dr. Ubaldo Glassing  Past Medical History:  Diagnosis Date  . Anemia   . Anxiety   . Arthritis   . COPD (chronic obstructive pulmonary disease) ([place])   . Coronary artery disease   . Edema    feet/ankles   wears support hose  . GERD (gastroesophageal reflux disease)   . GI (gastrointestinal bleed)    history  . Hypertension   . Myocardial infarction ([place])   . Peripheral vascular disease ([place])   . Shortness of breath dyspnea     Past Surgical History:  Procedure Laterality Date  . ABDOMINAL HYSTERECTOMY    . ABLATION     right leg less than 1 year ago  . CATARACT EXTRACTION W/ INTRAOCULAR LENS  IMPLANT, BILATERAL Bilateral   . CORONARY ANGIOPLASTY     stents  . EYE SURGERY    . FRACTURE SURGERY     right wrist  . TOTAL HIP ARTHROPLASTY Right 02/20/2016   Procedure: TOTAL HIP ARTHROPLASTY ANTERIOR APPROACH;  Surgeon: Hessie Knows, MD;  Location: [facility] ORS;  Service: Orthopedics;  Laterality: Right;    History reviewed. No pertinent family history.  Social History:  reports  that she quit smoking about 21 years ago. She has never used smokeless tobacco. She reports that she does not drink alcohol or use drugs.  Allergies: No Known Allergies  Medications: I have reviewed the patient's current medications.  Results for orders placed or performed during the hospital encounter of 07/12/18 (from the past 48 hour(s))  Basic metabolic panel     Status: Abnormal   Collection Time: 07/12/18 10:35 PM  Result Value Ref Range   Sodium 140 135 - 145 mmol/L   Potassium 3.6 3.5 - 5.1 mmol/L    Comment: HEMOLYSIS AT THIS LEVEL MAY AFFECT RESULT   Chloride 106 98 - 111 mmol/L   CO2 25 22 - 32 mmol/L   Glucose, Bld 117 (H) 70 - 99 mg/dL   BUN 27 (H) 8 - 23 mg/dL   Creatinine, Ser 0.72 0.44 - 1.00 mg/dL   Calcium 9.4 8.9 - 10.3 mg/dL   GFR calc non Af Amer >60 >60 mL/min   GFR calc Af Amer >60 >60 mL/min    Comment: (NOTE) The eGFR has been calculated using the CKD EPI equation. This calculation has not been validated in all clinical situations. eGFR's persistently <60 mL/min signify possible Chronic Kidney Disease.    Anion gap 9 5 - 15    Comment: Performed at [facility], [address] [place], [place] [address]  CBC     Status: Abnormal   Collection Time:  07/12/18 10:35 PM  Result Value Ref Range   WBC 12.0 (H) 3.6 - 11.0 K/uL   RBC 5.23 (H) 3.80 - 5.20 MIL/uL   Hemoglobin 13.7 12.0 - 16.0 g/dL   HCT 41.7 35.0 - 47.0 %   MCV 79.7 (L) 80.0 - 100.0 fL   MCH 26.1 26.0 - 34.0 pg   MCHC 32.8 32.0 - 36.0 g/dL   RDW 16.5 (H) 11.5 - 14.5 %   Platelets 156 150 - 440 K/uL    Comment: Performed at [facility], [address] [place], [place] [address]  Troponin I     Status: None   Collection Time: 07/12/18 10:35 PM  Result Value Ref Range   Troponin I <0.03 <0.03 ng/mL    Comment: Performed at [facility], [address] [place], [place] [address]  Brain natriuretic peptide     Status: Abnormal   Collection Time: 07/12/18 10:35  PM  Result Value Ref Range   B Natriuretic Peptide 198.0 (H) 0.0 - 100.0 pg/mL    Comment: Performed at [facility], [address] [place], [place] [address]  Basic metabolic panel     Status: Abnormal   Collection Time: 07/13/18  4:51 AM  Result Value Ref Range   Sodium 141 135 - 145 mmol/L   Potassium 2.7 (LL) 3.5 - 5.1 mmol/L    Comment: CRITICAL RESULT CALLED TO, READ BACK BY AND VERIFIED WITH MERDITH FITTS ON 07/13/18 AT 0649 QSD    Chloride 104 98 - 111 mmol/L   CO2 27 22 - 32 mmol/L   Glucose, Bld 186 (H) 70 - 99 mg/dL   BUN 20 8 - 23 mg/dL   Creatinine, Ser 0.73 0.44 - 1.00 mg/dL   Calcium 9.2 8.9 - 10.3 mg/dL   GFR calc non Af Amer >60 >60 mL/min   GFR calc Af Amer >60 >60 mL/min    Comment: (NOTE) The eGFR has been calculated using the CKD EPI equation. This calculation has not been validated in all clinical situations. eGFR's persistently <60 mL/min signify possible Chronic Kidney Disease.    Anion gap 10 5 - 15    Comment: Performed at [facility], [address] [place], [place] [address]  CBC     Status: Abnormal   Collection Time: 07/13/18  4:51 AM  Result Value Ref Range   WBC 17.6 (H) 3.6 - 11.0 K/uL   RBC 5.05 3.80 - 5.20 MIL/uL   Hemoglobin 13.0 12.0 - 16.0 g/dL   HCT 40.3 35.0 - 47.0 %   MCV 79.8 (L) 80.0 - 100.0 fL   MCH 25.7 (L) 26.0 - 34.0 pg   MCHC 32.2 32.0 - 36.0 g/dL   RDW 16.3 (H) 11.5 - 14.5 %   Platelets 142 (L) 150 - 440 K/uL    Comment: Performed at [facility], [address] [place], [place] [address]  Glucose, capillary     Status: Abnormal   Collection Time: 07/13/18  7:54 AM  Result Value Ref Range   Glucose-Capillary 154 (H) 70 - 99 mg/dL  Glucose, capillary     Status: Abnormal   Collection Time: 07/13/18 11:56 AM  Result Value Ref Range   Glucose-Capillary 180 (H) 70 - 99 mg/dL  Troponin I     Status: None   Collection Time: 07/13/18 12:32 PM  Result Value Ref Range   Troponin I <0.03 <0.03  ng/mL    Comment: Performed at [facility], [address] [place], [place] [address]  Troponin I     Status: None   Collection Time: 07/13/18  3:23 PM  Result Value Ref Range   Troponin I <0.03 <0.03 ng/mL    Comment: Performed at [facility], [address] [place], [place] [address]  Glucose, capillary     Status: Abnormal   Collection Time: 07/13/18  4:42 PM  Result Value Ref Range   Glucose-Capillary 133 (H) 70 - 99 mg/dL  Troponin I     Status: None   Collection Time: 07/13/18  9:35 PM  Result Value Ref Range   Troponin I <0.03 <0.03 ng/mL    Comment: Performed at [facility], [address] [place], [place] [address]  Basic metabolic panel     Status: Abnormal   Collection Time: 07/14/18  4:31 AM  Result Value Ref Range   Sodium 138 135 - 145 mmol/L   Potassium 4.0 3.5 - 5.1 mmol/L   Chloride 103 98 - 111 mmol/L   CO2 27 22 - 32 mmol/L   Glucose, Bld 134 (H) 70 - 99 mg/dL   BUN 33 (H) 8 - 23 mg/dL   Creatinine, Ser 0.80 0.44 - 1.00 mg/dL   Calcium 8.8 (L) 8.9 - 10.3 mg/dL   GFR calc non Af Amer >60 >60 mL/min   GFR calc Af Amer >60 >60 mL/min    Comment: (NOTE) The eGFR has been calculated using the CKD EPI equation. This calculation has not been validated in all clinical situations. eGFR's persistently <60 mL/min signify possible Chronic Kidney Disease.    Anion gap 8 5 - 15    Comment: Performed at [facility], [address] [place], [place] [address]    Dg Chest 2 View  Result Date: 07/12/2018 CLINICAL DATA:  Chest pain EXAM: CHEST - 2 VIEW COMPARISON:  06/01/2011, CT 06/02/2011 FINDINGS: Cardiomegaly with vascular congestion. Low lung volumes. No focal airspace disease or significant pleural effusion. Moderate to large hiatal hernia. Aortic atherosclerosis. No pneumothorax. IMPRESSION: Cardiomegaly with vascular congestion. Moderate to large hiatal hernia Electronically Signed   By: Donavan Foil M.D.   On:  07/12/2018 23:08    Review of Systems  Constitutional: Positive for malaise/fatigue.  HENT: Positive for congestion.   Eyes: Negative.   Respiratory: Positive for shortness of breath.   Cardiovascular: Positive for chest pain.  Gastrointestinal: Positive for heartburn.  Genitourinary: Negative.   Musculoskeletal: Negative.   Skin: Negative.   Neurological: Negative.   Endo/Heme/Allergies: Negative.    Blood pressure 136/65, pulse 94, temperature 97.8 F (36.6 C), temperature source Oral, resp. rate 20, height 4' 11"  (1.499 m), weight 75.1 kg, SpO2 95 %. Physical Exam  Nursing note and vitals reviewed. Constitutional: She is oriented to person, place, and time. She appears well-developed and well-nourished.  HENT:  Head: Normocephalic and atraumatic.  Eyes: Pupils are equal, round, and reactive to light. Conjunctivae and EOM are normal.  Neck: Normal range of motion. Neck supple.  Cardiovascular: Normal rate and regular rhythm.  Murmur heard. Respiratory: Effort normal and breath sounds normal.  GI: Soft. Bowel sounds are normal.  Musculoskeletal: Normal range of motion.  Neurological: She is alert and oriented to person, place, and time. She has normal reflexes.  Skin: Skin is warm and dry.  Psychiatric: She has a normal mood and affect.    Assessment/Plan: Chest pain Possible angina Acute respiratory distress COPD Hypertension . Plan Agree with admit rule out for microinfarction Aspirin anticoagulation Recommend  treatment for COPD include inhalers supplemental oxygen Consider evaluation by pulmonary Continue hypertension management and control Diuretic therapy with IV Lasix as necessary for pulmonary congestion Follow-up troponins and EKGs Recommend functional study with Myoview  Dwayne D Callwood 07/14/2018, 7:18 AM

## 2018-07-14 NOTE — Plan of Care (Signed)

## 2018-07-15 ENCOUNTER — Emergency Department: Payer: Medicare Other

## 2018-07-15 ENCOUNTER — Inpatient Hospital Stay: Payer: Medicare Other | Admitting: Anesthesiology

## 2018-07-15 ENCOUNTER — Encounter
Admission: EM | Disposition: A | Discharge status: Home Health | Payer: Self-pay | Source: Home / Self Care | Attending: Internal Medicine

## 2018-07-15 ENCOUNTER — Inpatient Hospital Stay: Payer: Medicare Other

## 2018-07-15 ENCOUNTER — Inpatient Hospital Stay
Admission: EM | Admit: 2018-07-15 | Discharge: 2018-07-19 | DRG: 418 | Disposition: A | Discharge status: Home Health | Payer: Medicare Other | Attending: Internal Medicine | Admitting: Internal Medicine

## 2018-07-15 DIAGNOSIS — Z9841 Cataract extraction status, right eye: Secondary | ICD-10-CM

## 2018-07-15 DIAGNOSIS — Z87891 Personal history of nicotine dependence: Secondary | ICD-10-CM | POA: Diagnosis not present

## 2018-07-15 DIAGNOSIS — Z66 Do not resuscitate: Secondary | ICD-10-CM | POA: Diagnosis present

## 2018-07-15 DIAGNOSIS — M1990 Unspecified osteoarthritis, unspecified site: Secondary | ICD-10-CM | POA: Diagnosis present

## 2018-07-15 DIAGNOSIS — K819 Cholecystitis, unspecified: Secondary | ICD-10-CM

## 2018-07-15 DIAGNOSIS — I248 Other forms of acute ischemic heart disease: Secondary | ICD-10-CM | POA: Diagnosis present

## 2018-07-15 DIAGNOSIS — I252 Old myocardial infarction: Secondary | ICD-10-CM

## 2018-07-15 DIAGNOSIS — I5032 Chronic diastolic (congestive) heart failure: Secondary | ICD-10-CM | POA: Diagnosis present

## 2018-07-15 DIAGNOSIS — Z9842 Cataract extraction status, left eye: Secondary | ICD-10-CM

## 2018-07-15 DIAGNOSIS — Z961 Presence of intraocular lens: Secondary | ICD-10-CM | POA: Diagnosis present

## 2018-07-15 DIAGNOSIS — K5900 Constipation, unspecified: Secondary | ICD-10-CM | POA: Diagnosis not present

## 2018-07-15 DIAGNOSIS — F419 Anxiety disorder, unspecified: Secondary | ICD-10-CM | POA: Diagnosis present

## 2018-07-15 DIAGNOSIS — I110 Hypertensive heart disease with heart failure: Secondary | ICD-10-CM | POA: Diagnosis present

## 2018-07-15 DIAGNOSIS — I2510 Atherosclerotic heart disease of native coronary artery without angina pectoris: Secondary | ICD-10-CM | POA: Diagnosis present

## 2018-07-15 DIAGNOSIS — I739 Peripheral vascular disease, unspecified: Secondary | ICD-10-CM | POA: Diagnosis present

## 2018-07-15 DIAGNOSIS — Z23 Encounter for immunization: Secondary | ICD-10-CM | POA: Diagnosis present

## 2018-07-15 DIAGNOSIS — J810 Acute pulmonary edema: Secondary | ICD-10-CM

## 2018-07-15 DIAGNOSIS — R7989 Other specified abnormal findings of blood chemistry: Secondary | ICD-10-CM

## 2018-07-15 DIAGNOSIS — Z79899 Other long term (current) drug therapy: Secondary | ICD-10-CM | POA: Diagnosis not present

## 2018-07-15 DIAGNOSIS — K449 Diaphragmatic hernia without obstruction or gangrene: Secondary | ICD-10-CM | POA: Diagnosis present

## 2018-07-15 DIAGNOSIS — J449 Chronic obstructive pulmonary disease, unspecified: Secondary | ICD-10-CM | POA: Diagnosis present

## 2018-07-15 DIAGNOSIS — Z791 Long term (current) use of non-steroidal anti-inflammatories (NSAID): Secondary | ICD-10-CM

## 2018-07-15 DIAGNOSIS — Z7951 Long term (current) use of inhaled steroids: Secondary | ICD-10-CM | POA: Diagnosis not present

## 2018-07-15 DIAGNOSIS — Z9861 Coronary angioplasty status: Secondary | ICD-10-CM

## 2018-07-15 DIAGNOSIS — Z96641 Presence of right artificial hip joint: Secondary | ICD-10-CM | POA: Diagnosis present

## 2018-07-15 DIAGNOSIS — K8066 Calculus of gallbladder and bile duct with acute and chronic cholecystitis without obstruction: Secondary | ICD-10-CM | POA: Diagnosis present

## 2018-07-15 DIAGNOSIS — Z90710 Acquired absence of both cervix and uterus: Secondary | ICD-10-CM

## 2018-07-15 DIAGNOSIS — K8050 Calculus of bile duct without cholangitis or cholecystitis without obstruction: Secondary | ICD-10-CM | POA: Diagnosis present

## 2018-07-15 DIAGNOSIS — K219 Gastro-esophageal reflux disease without esophagitis: Secondary | ICD-10-CM | POA: Diagnosis present

## 2018-07-15 DIAGNOSIS — R52 Pain, unspecified: Secondary | ICD-10-CM

## 2018-07-15 DIAGNOSIS — R778 Other specified abnormalities of plasma proteins: Secondary | ICD-10-CM

## 2018-07-15 HISTORY — PX: ENDOSCOPIC RETROGRADE CHOLANGIOPANCREATOGRAPHY (ERCP) WITH PROPOFOL: SHX5810

## 2018-07-15 LAB — CBC WITH DIFFERENTIAL/PLATELET
Basophils Absolute: 0 10*3/uL (ref 0–0.1)
Basophils Relative: 0 %
EOS ABS: 0 10*3/uL (ref 0–0.7)
EOS PCT: 0 %
HCT: 40.2 % (ref 35.0–47.0)
Hemoglobin: 13.1 g/dL (ref 12.0–16.0)
LYMPHS ABS: 0.4 10*3/uL — AB (ref 1.0–3.6)
LYMPHS PCT: 3 %
MCH: 26 pg (ref 26.0–34.0)
MCHC: 32.7 g/dL (ref 32.0–36.0)
MCV: 79.4 fL — ABNORMAL LOW (ref 80.0–100.0)
MONO ABS: 0.3 10*3/uL (ref 0.2–0.9)
MONOS PCT: 3 %
Neutro Abs: 11.5 10*3/uL — ABNORMAL HIGH (ref 1.4–6.5)
Neutrophils Relative %: 94 %
PLATELETS: 104 10*3/uL — AB (ref 150–440)
RBC: 5.06 MIL/uL (ref 3.80–5.20)
RDW: 16.8 % — AB (ref 11.5–14.5)
WBC: 12.3 10*3/uL — AB (ref 3.6–11.0)

## 2018-07-15 LAB — COMPREHENSIVE METABOLIC PANEL
ALK PHOS: 330 U/L — AB (ref 38–126)
ALT: 449 U/L — AB (ref 0–44)
AST: 256 U/L — AB (ref 15–41)
Albumin: 3.4 g/dL — ABNORMAL LOW (ref 3.5–5.0)
Anion gap: 13 (ref 5–15)
BUN: 33 mg/dL — AB (ref 8–23)
CALCIUM: 8.6 mg/dL — AB (ref 8.9–10.3)
CHLORIDE: 103 mmol/L (ref 98–111)
CO2: 20 mmol/L — AB (ref 22–32)
CREATININE: 0.84 mg/dL (ref 0.44–1.00)
GFR calc non Af Amer: >60 mL/min (ref >60)
Glucose, Bld: 130 mg/dL — ABNORMAL HIGH (ref 70–99)
Potassium: 3.3 mmol/L — ABNORMAL LOW (ref 3.5–5.1)
Sodium: 136 mmol/L (ref 135–145)
Total Bilirubin: 6.6 mg/dL — ABNORMAL HIGH (ref 0.3–1.2)
Total Protein: 6.3 g/dL — ABNORMAL LOW (ref 6.5–8.1)

## 2018-07-15 LAB — TROPONIN I
Troponin I: 0.06 ng/mL (ref <0.03)
Troponin I: 0.03 ng/mL (ref <0.03)
Troponin I: <0.03 ng/mL (ref <0.03)
Troponin I: 0.03 ng/mL (ref <0.03)

## 2018-07-15 LAB — BILIRUBIN, DIRECT: BILIRUBIN DIRECT: 4.2 mg/dL — AB (ref 0.0–0.2)

## 2018-07-15 LAB — GLUCOSE, CAPILLARY
Glucose-Capillary: 116 mg/dL — ABNORMAL HIGH (ref 70–99)
Glucose-Capillary: 152 mg/dL — ABNORMAL HIGH (ref 70–99)
Glucose-Capillary: 141 mg/dL — ABNORMAL HIGH (ref 70–99)

## 2018-07-15 LAB — HEMOGLOBIN AND HEMATOCRIT, BLOOD
HEMATOCRIT: 36.8 % (ref 35.0–47.0)
HEMOGLOBIN: 11.9 g/dL — AB (ref 12.0–16.0)

## 2018-07-15 LAB — LIPASE, BLOOD: Lipase: 43 U/L (ref 11–51)

## 2018-07-15 LAB — TSH: TSH: 1.194 u[IU]/mL (ref 0.350–4.500)

## 2018-07-15 LAB — BRAIN NATRIURETIC PEPTIDE: B Natriuretic Peptide: 478 pg/mL — ABNORMAL HIGH (ref 0.0–100.0)

## 2018-07-15 SURGERY — ENDOSCOPIC RETROGRADE CHOLANGIOPANCREATOGRAPHY (ERCP) WITH PROPOFOL
Anesthesia: General

## 2018-07-15 MED ORDER — PHENYLEPHRINE HCL 10 MG/ML IJ SOLN
INTRAMUSCULAR | Status: DC | PRN
  Administered 2018-07-15: 200 ug via INTRAVENOUS
  Administered 2018-07-15 (×3): 100 ug via INTRAVENOUS

## 2018-07-15 MED ORDER — DEXTROMETHORPHAN POLISTIREX ER 30 MG/5ML PO SUER
30.0000 mg | Freq: Two times a day (BID) | ORAL | Status: DC
  Administered 2018-07-15 – 2018-07-19 (×8): 30 mg via ORAL
  Filled 2018-07-15 (×9): qty 5

## 2018-07-15 MED ORDER — PROPOFOL 10 MG/ML IV BOLUS
INTRAVENOUS | Status: DC | PRN
  Administered 2018-07-15: 130 mg via INTRAVENOUS

## 2018-07-15 MED ORDER — POTASSIUM CHLORIDE 20 MEQ PO PACK
40.0000 meq | PACK | Freq: Once | ORAL | Status: AC
  Administered 2018-07-15: 40 meq via ORAL
  Filled 2018-07-15: qty 2

## 2018-07-15 MED ORDER — INDOMETHACIN 50 MG RE SUPP
100.0000 mg | Freq: Once | RECTAL | Status: AC
  Administered 2018-07-15: 100 mg via RECTAL

## 2018-07-15 MED ORDER — NITROGLYCERIN 0.4 MG SL SUBL: 0.4000 mg | SUBLINGUAL_TABLET | SUBLINGUAL | Status: DC | PRN

## 2018-07-15 MED ORDER — DM-GUAIFENESIN ER 30-600 MG PO TB12: 1.0000 | ORAL_TABLET | Freq: Two times a day (BID) | ORAL | Status: DC

## 2018-07-15 MED ORDER — SUCCINYLCHOLINE CHLORIDE 20 MG/ML IJ SOLN
INTRAMUSCULAR | Status: DC | PRN
  Administered 2018-07-15: 100 mg via INTRAVENOUS

## 2018-07-15 MED ORDER — SIMVASTATIN 20 MG PO TABS
40.0000 mg | ORAL_TABLET | Freq: Every day | ORAL | Status: DC
  Administered 2018-07-15 – 2018-07-19 (×5): 40 mg via ORAL
  Filled 2018-07-15 (×5): qty 2

## 2018-07-15 MED ORDER — IPRATROPIUM-ALBUTEROL 0.5-2.5 (3) MG/3ML IN SOLN
3.0000 mL | Freq: Once | RESPIRATORY_TRACT | Status: AC
  Administered 2018-07-15: 3 mL via RESPIRATORY_TRACT

## 2018-07-15 MED ORDER — VITAMIN D3 25 MCG (1000 UNIT) PO TABS
1000.0000 [IU] | ORAL_TABLET | Freq: Every day | ORAL | Status: DC
  Administered 2018-07-15 – 2018-07-18 (×3): 1000 [IU] via ORAL
  Filled 2018-07-15 (×5): qty 1

## 2018-07-15 MED ORDER — LIDOCAINE HCL (CARDIAC) PF 100 MG/5ML IV SOSY
PREFILLED_SYRINGE | INTRAVENOUS | Status: DC | PRN
  Administered 2018-07-15: 100 mg via INTRAVENOUS

## 2018-07-15 MED ORDER — PANTOPRAZOLE SODIUM 40 MG PO TBEC
40.0000 mg | DELAYED_RELEASE_TABLET | Freq: Every day | ORAL | Status: DC
  Administered 2018-07-15 – 2018-07-19 (×5): 40 mg via ORAL
  Filled 2018-07-15 (×5): qty 1

## 2018-07-15 MED ORDER — SODIUM CHLORIDE 0.9 % IV SOLN
1.0000 g | Freq: Once | INTRAVENOUS | Status: AC
  Administered 2018-07-15: 1 g via INTRAVENOUS
  Filled 2018-07-15: qty 10

## 2018-07-15 MED ORDER — ACETAMINOPHEN 325 MG PO TABS
650.0000 mg | ORAL_TABLET | Freq: Four times a day (QID) | ORAL | Status: DC | PRN
  Administered 2018-07-18 – 2018-07-19 (×3): 650 mg via ORAL
  Filled 2018-07-15 (×3): qty 2

## 2018-07-15 MED ORDER — VITAMIN B-12 1000 MCG PO TABS
1000.0000 ug | ORAL_TABLET | Freq: Every day | ORAL | Status: DC
  Administered 2018-07-15 – 2018-07-19 (×5): 1000 ug via ORAL
  Filled 2018-07-15 (×5): qty 1

## 2018-07-15 MED ORDER — FUROSEMIDE 10 MG/ML IJ SOLN: 40.0000 mg | Freq: Two times a day (BID) | INTRAMUSCULAR | Status: DC

## 2018-07-15 MED ORDER — SODIUM CHLORIDE 0.9 % IV BOLUS
1000.0000 mL | Freq: Once | INTRAVENOUS | Status: DC
  Administered 2018-07-15: 1000 mL via INTRAVENOUS

## 2018-07-15 MED ORDER — FUROSEMIDE 10 MG/ML IJ SOLN: 20.0000 mg | Freq: Once | INTRAMUSCULAR | Status: DC

## 2018-07-15 MED ORDER — PANTOPRAZOLE SODIUM 40 MG PO TBEC: 40.0000 mg | DELAYED_RELEASE_TABLET | Freq: Every day | ORAL | Status: DC

## 2018-07-15 MED ORDER — IOPAMIDOL (ISOVUE-300) INJECTION 61%
100.0000 mL | Freq: Once | INTRAVENOUS | Status: AC | PRN
  Administered 2018-07-15: 100 mL via INTRAVENOUS

## 2018-07-15 MED ORDER — DOCUSATE SODIUM 100 MG PO CAPS
100.0000 mg | ORAL_CAPSULE | Freq: Two times a day (BID) | ORAL | Status: DC
  Administered 2018-07-15 – 2018-07-19 (×8): 100 mg via ORAL
  Filled 2018-07-15 (×9): qty 1

## 2018-07-15 MED ORDER — PROCHLORPERAZINE EDISYLATE 10 MG/2ML IJ SOLN
5.0000 mg | Freq: Once | INTRAMUSCULAR | Status: AC
  Administered 2018-07-15: 5 mg via INTRAVENOUS
  Filled 2018-07-15: qty 2

## 2018-07-15 MED ORDER — ONDANSETRON HCL 4 MG/2ML IJ SOLN
4.0000 mg | Freq: Four times a day (QID) | INTRAMUSCULAR | Status: DC | PRN
  Administered 2018-07-15: 4 mg via INTRAVENOUS
  Filled 2018-07-15: qty 2

## 2018-07-15 MED ORDER — LISINOPRIL 5 MG PO TABS
5.0000 mg | ORAL_TABLET | Freq: Every day | ORAL | Status: DC
  Administered 2018-07-15 – 2018-07-19 (×5): 5 mg via ORAL
  Filled 2018-07-15 (×5): qty 1

## 2018-07-15 MED ORDER — IPRATROPIUM-ALBUTEROL 0.5-2.5 (3) MG/3ML IN SOLN
3.0000 mL | Freq: Four times a day (QID) | RESPIRATORY_TRACT | Status: DC
  Administered 2018-07-16: 3 mL via RESPIRATORY_TRACT
  Filled 2018-07-15 (×2): qty 3

## 2018-07-15 MED ORDER — FENTANYL CITRATE (PF) 100 MCG/2ML IJ SOLN
INTRAMUSCULAR | Status: AC
  Filled 2018-07-15: qty 2

## 2018-07-15 MED ORDER — SODIUM CHLORIDE 0.9 % IV SOLN
INTRAVENOUS | Status: DC | PRN
  Administered 2018-07-15: 30 ug/min via INTRAVENOUS

## 2018-07-15 MED ORDER — OXYCODONE HCL 5 MG/5ML PO SOLN: 5.0000 mg | Freq: Once | ORAL | Status: DC | PRN

## 2018-07-15 MED ORDER — INDOMETHACIN 50 MG RE SUPP
100.0000 mg | Freq: Once | RECTAL | Status: DC
  Filled 2018-07-15: qty 2

## 2018-07-15 MED ORDER — FENTANYL CITRATE (PF) 100 MCG/2ML IJ SOLN
INTRAMUSCULAR | Status: DC | PRN
  Administered 2018-07-15 (×2): 50 ug via INTRAVENOUS

## 2018-07-15 MED ORDER — ONDANSETRON HCL 4 MG PO TABS: 4.0000 mg | ORAL_TABLET | Freq: Four times a day (QID) | ORAL | Status: DC | PRN

## 2018-07-15 MED ORDER — SUCCINYLCHOLINE CHLORIDE 20 MG/ML IJ SOLN
INTRAMUSCULAR | Status: AC
  Filled 2018-07-15: qty 1

## 2018-07-15 MED ORDER — GUAIFENESIN ER 600 MG PO TB12
600.0000 mg | ORAL_TABLET | Freq: Two times a day (BID) | ORAL | Status: DC
  Administered 2018-07-15 – 2018-07-19 (×8): 600 mg via ORAL
  Filled 2018-07-15 (×8): qty 1

## 2018-07-15 MED ORDER — METHYLPREDNISOLONE SODIUM SUCC 125 MG IJ SOLR
80.0000 mg | Freq: Once | INTRAMUSCULAR | Status: AC
  Administered 2018-07-15: 80 mg via INTRAVENOUS
  Filled 2018-07-15: qty 2

## 2018-07-15 MED ORDER — IPRATROPIUM-ALBUTEROL 0.5-2.5 (3) MG/3ML IN SOLN
3.0000 mL | RESPIRATORY_TRACT | Status: DC
  Administered 2018-07-15 (×2): 3 mL via RESPIRATORY_TRACT
  Filled 2018-07-15: qty 3

## 2018-07-15 MED ORDER — ACETAMINOPHEN 650 MG RE SUPP: 650.0000 mg | Freq: Four times a day (QID) | RECTAL | Status: DC | PRN

## 2018-07-15 MED ORDER — ISOSORBIDE DINITRATE 30 MG PO TABS
30.0000 mg | ORAL_TABLET | Freq: Two times a day (BID) | ORAL | Status: DC
  Administered 2018-07-15 – 2018-07-19 (×9): 30 mg via ORAL
  Filled 2018-07-15 (×10): qty 1

## 2018-07-15 MED ORDER — FENTANYL CITRATE (PF) 100 MCG/2ML IJ SOLN: 25.0000 ug | INTRAMUSCULAR | Status: DC | PRN

## 2018-07-15 MED ORDER — ALPRAZOLAM 0.5 MG PO TABS
0.2500 mg | ORAL_TABLET | Freq: Two times a day (BID) | ORAL | Status: DC
  Administered 2018-07-15 – 2018-07-18 (×5): 0.25 mg via ORAL
  Filled 2018-07-15 (×8): qty 1

## 2018-07-15 MED ORDER — OXYCODONE HCL 5 MG PO TABS: 5.0000 mg | ORAL_TABLET | Freq: Once | ORAL | Status: DC | PRN

## 2018-07-15 MED ORDER — FUROSEMIDE 10 MG/ML IJ SOLN
20.0000 mg | Freq: Once | INTRAMUSCULAR | Status: AC
  Administered 2018-07-15: 20 mg via INTRAVENOUS
  Filled 2018-07-15: qty 2

## 2018-07-15 MED ORDER — INDOMETHACIN 50 MG RE SUPP
RECTAL | Status: AC
  Filled 2018-07-15: qty 2

## 2018-07-15 MED ORDER — AMLODIPINE BESYLATE 10 MG PO TABS
10.0000 mg | ORAL_TABLET | Freq: Every day | ORAL | Status: DC
  Administered 2018-07-15 – 2018-07-19 (×5): 10 mg via ORAL
  Filled 2018-07-15 (×5): qty 1

## 2018-07-15 MED ORDER — IPRATROPIUM-ALBUTEROL 0.5-2.5 (3) MG/3ML IN SOLN
3.0000 mL | Freq: Once | RESPIRATORY_TRACT | Status: AC
  Administered 2018-07-15: 3 mL via RESPIRATORY_TRACT
  Filled 2018-07-15: qty 6

## 2018-07-15 MED ORDER — METOPROLOL SUCCINATE ER 25 MG PO TB24
75.0000 mg | ORAL_TABLET | Freq: Every day | ORAL | Status: DC
  Administered 2018-07-15 – 2018-07-19 (×5): 75 mg via ORAL
  Filled 2018-07-15 (×5): qty 3

## 2018-07-15 NOTE — H&P (Signed)
Ashley [name] is an 84 y.o. female.   Chief Complaint: Vomiting HPI: Patient with past medical history of COPD, hypertension and CAD status post MI presents to the emergency department complaining of vomiting.  The patient was just discharged from the hospital earlier this morning but developed some abdominal pain as well as vomiting the color of coffee grounds.  In the emergency department ultrasound of the patient's gallbladder showed enlarged CBD as well as multiple stones.  CT of that abdomen confirmed cholecystitis as well which prompted the emergency department staff to call hospitalist service for admission.  Past Medical History:  Diagnosis Date  . Anemia   . Anxiety   . Arthritis   . COPD (chronic obstructive pulmonary disease) ([place])   . Coronary artery disease   . Edema    feet/ankles   wears support hose  . GERD (gastroesophageal reflux disease)   . GI (gastrointestinal bleed)    history  . Hypertension   . Myocardial infarction ([place])   . Peripheral vascular disease ([place])   . Shortness of breath dyspnea     Past Surgical History:  Procedure Laterality Date  . ABDOMINAL HYSTERECTOMY    . ABLATION     right leg less than 1 year ago  . CATARACT EXTRACTION W/ INTRAOCULAR LENS  IMPLANT, BILATERAL Bilateral   . CORONARY ANGIOPLASTY     stents  . EYE SURGERY    . FRACTURE SURGERY     right wrist  . TOTAL HIP ARTHROPLASTY Right 02/20/2016   Procedure: TOTAL HIP ARTHROPLASTY ANTERIOR APPROACH;  Surgeon: Hessie Knows, MD;  Location: [facility] ORS;  Service: Orthopedics;  Laterality: Right;    No family history on file. Social History:  reports that she quit smoking about 21 years ago. She has never used smokeless tobacco. She reports that she does not drink alcohol or use drugs.  Allergies: No Known Allergies  Prior to Admission medications   Medication Sig Start Date End Date Taking? Authorizing Provider  ALPRAZolam (XANAX) 0.25 MG tablet Take 0.25 mg by mouth 2 (two) times  daily. As needed usually just at bedtime   Yes [provider]  amLODipine (NORVASC) 10 MG tablet Take 10 mg by mouth daily.   Yes [provider]  cholecalciferol (VITAMIN D) 1000 units tablet Take 1,000 Units by mouth daily.   Yes [provider]  ibuprofen (ADVIL,MOTRIN) 200 MG tablet Take 1 tablet (200 mg total) by mouth every 8 (eight) hours as needed. 07/14/18  Yes Fritzi Mandes, MD  Ipratropium-Albuterol (COMBIVENT RESPIMAT) 20-100 MCG/ACT AERS respimat Inhale 1 puff into the lungs every 6 (six) hours.   Yes [provider]  isosorbide dinitrate (ISORDIL) 30 MG tablet Take 30 mg by mouth 2 (two) times daily.   Yes [provider]  lisinopril (PRINIVIL,ZESTRIL) 10 MG tablet Take 5 mg by mouth daily.   Yes [provider]  metoprolol succinate (TOPROL-XL) 50 MG 24 hr tablet Take 75 mg by mouth daily. Take with or immediately following a meal.   Yes [provider]  nitroGLYCERIN (NITROSTAT) 0.4 MG SL tablet Place 1 tablet (0.4 mg total) under the tongue every 5 (five) minutes as needed for chest pain. 07/14/18  Yes Fritzi Mandes, MD  omeprazole (PRILOSEC) 20 MG capsule Take 20 mg by mouth daily.   Yes [provider]  simvastatin (ZOCOR) 40 MG tablet Take 40 mg by mouth daily.   Yes [provider]  vitamin B-12 (CYANOCOBALAMIN) 1000 MCG tablet Take 1,000  mcg by mouth daily.   Yes [provider]     Results for orders placed or performed during the hospital encounter of 07/15/18 (from the past 48 hour(s))  Comprehensive metabolic panel     Status: Abnormal   Collection Time: 07/15/18  4:11 AM  Result Value Ref Range   Sodium 136 135 - 145 mmol/L   Potassium 3.3 (L) 3.5 - 5.1 mmol/L    Comment: HEMOLYSIS AT THIS LEVEL MAY AFFECT RESULT   Chloride 103 98 - 111 mmol/L   CO2 20 (L) 22 - 32 mmol/L   Glucose, Bld 130 (H) 70 - 99 mg/dL   BUN 33 (H) 8 - 23 mg/dL   Creatinine, Ser 0.84 0.44 - 1.00 mg/dL    Calcium 8.6 (L) 8.9 - 10.3 mg/dL   Total Protein 6.3 (L) 6.5 - 8.1 g/dL   Albumin 3.4 (L) 3.5 - 5.0 g/dL   AST 256 (H) 15 - 41 U/L   ALT 449 (H) 0 - 44 U/L   Alkaline Phosphatase 330 (H) 38 - 126 U/L   Total Bilirubin 6.6 (H) 0.3 - 1.2 mg/dL   GFR calc non Af Amer >60 >60 mL/min   GFR calc Af Amer >60 >60 mL/min    Comment: (NOTE) The eGFR has been calculated using the CKD EPI equation. This calculation has not been validated in all clinical situations. eGFR's persistently <60 mL/min signify possible Chronic Kidney Disease.    Anion gap 13 5 - 15    Comment: Performed at [facility], [address] [place], [place] [address]  Troponin I     Status: Abnormal   Collection Time: 07/15/18  4:11 AM  Result Value Ref Range   Troponin I 0.06 (HH) <0.03 ng/mL    Comment: CRITICAL RESULT CALLED TO, READ BACK BY AND VERIFIED WITH JOHN HOFFMASTER ON 07/15/18 AT 0509 JAG Performed at [facility], [address] [place], [place] [address]   CBC with Differential     Status: Abnormal   Collection Time: 07/15/18  4:11 AM  Result Value Ref Range   WBC 12.3 (H) 3.6 - 11.0 K/uL   RBC 5.06 3.80 - 5.20 MIL/uL   Hemoglobin 13.1 12.0 - 16.0 g/dL   HCT 40.2 35.0 - 47.0 %   MCV 79.4 (L) 80.0 - 100.0 fL   MCH 26.0 26.0 - 34.0 pg   MCHC 32.7 32.0 - 36.0 g/dL   RDW 16.8 (H) 11.5 - 14.5 %   Platelets 104 (L) 150 - 440 K/uL   Neutrophils Relative % 94 %   Neutro Abs 11.5 (H) 1.4 - 6.5 K/uL   Lymphocytes Relative 3 %   Lymphs Abs 0.4 (L) 1.0 - 3.6 K/uL   Monocytes Relative 3 %   Monocytes Absolute 0.3 0.2 - 0.9 K/uL   Eosinophils Relative 0 %   Eosinophils Absolute 0.0 0 - 0.7 K/uL   Basophils Relative 0 %   Basophils Absolute 0.0 0 - 0.1 K/uL    Comment: Performed at [facility], [address] [place], [place] [address]  Lipase, blood     Status: None   Collection Time: 07/15/18  4:11 AM  Result Value Ref Range   Lipase 43 11 - 51 U/L    Comment: Performed  at [facility], [address] [place], [place] [address]  Bilirubin, direct     Status: Abnormal   Collection Time: 07/15/18  4:11 AM  Result Value Ref Range  Bilirubin, Direct 4.2 (H) 0.0 - 0.2 mg/dL    Comment: Performed at [facility], [address] [place], [place] [address]  Brain natriuretic peptide     Status: Abnormal   Collection Time: 07/15/18  4:22 AM  Result Value Ref Range   B Natriuretic Peptide 478.0 (H) 0.0 - 100.0 pg/mL    Comment: Performed at [facility], [address], [place],  [address]   Ct Abdomen Pelvis W Contrast  Result Date: 07/15/2018 CLINICAL DATA:  Vomiting. EXAM: CT ABDOMEN AND PELVIS WITH CONTRAST TECHNIQUE: Multidetector CT imaging of the abdomen and pelvis was performed using the standard protocol following bolus administration of intravenous contrast. CONTRAST:  160m ISOVUE-300 IOPAMIDOL (ISOVUE-300) INJECTION 61% COMPARISON:  Ultrasound right upper quadrant 07/15/2018 FINDINGS: Lower chest: Scarring in the lung bases. Large esophageal hiatal hernia. Coronary artery calcifications. Hepatobiliary: There is severe intra and extrahepatic bile duct dilatation with extrahepatic bile ducts measuring about 2.5 cm in diameter. Focal density within the common bile duct likely representing a stone and measuring about 11 mm in diameter. The gallbladder is markedly distended with mild wall thickening and edema. Small stones are also seen in the gallbladder. No focal liver lesions. Pancreas: Unremarkable. No pancreatic ductal dilatation or surrounding inflammatory changes. Spleen: Normal in size without focal abnormality. Adrenals/Urinary Tract: No adrenal gland nodules. Multiple bilateral renal cysts. Nephrograms are homogeneous and symmetrical. No hydronephrosis or hydroureter. Bladder is unremarkable. Stomach/Bowel: Stomach, small bowel, and colon are not abnormally distended. Scattered diverticula in the sigmoid colon. No  evidence of diverticulitis. No wall thickening or inflammatory changes. Appendix is not identified. Vascular/Lymphatic: Tortuous and dilated aorta with calcification. Aortic diameter measures about 2.3 cm maximum. Extensive vascular calcifications throughout the abdomen and pelvis. Reproductive: Status post hysterectomy. No adnexal masses. Other: No free air or free fluid in the abdomen. Abdominal wall musculature appears intact. Musculoskeletal: Postoperative right hip arthroplasty. Degenerative changes in the spine. No destructive bone lesions. IMPRESSION: 1. Large esophageal hiatal hernia. 2. Severe intra and extrahepatic bile duct dilatation with choledocholithiasis. 3. Cholelithiasis with distended gallbladder and mild gallbladder wall thickening and edema, likely cholecystitis. 4. Diverticulosis of the sigmoid colon without evidence of diverticulitis. 5. Tortuous and dilated abdominal aorta with calcification measuring up to 2.3 cm diameter. Electronically Signed   By: WLucienne CapersM.D.   On: 07/15/2018 06:37   Dg Chest Port 1 View  Result Date: 07/15/2018 CLINICAL DATA:  Initial evaluation for acute shortness of breath. EXAM: PORTABLE CHEST 1 VIEW COMPARISON:  Prior radiograph from 07/12/2018 FINDINGS: Moderate cardiomegaly. Mediastinal silhouette normal. Aortic atherosclerosis. Large hiatal hernia again noted. Lungs are hypoinflated. Diffuse vascular and interstitial prominence, suggesting mild pulmonary interstitial congestion/edema. Suspected small left pleural effusion. Streaky left basilar opacity favored to reflect atelectatic changes. No other focal airspace disease. No pneumothorax. No acute osseous abnormality. IMPRESSION: 1. Cardiomegaly with mild diffuse pulmonary interstitial edema. 2. Probable small left pleural effusion. Associated streaky left basilar opacity favored to reflect atelectasis and/or congestion. 3. Large hiatal hernia. 4. Aortic atherosclerosis. Electronically Signed   By:  BJeannine BogaM.D.   On: 07/15/2018 04:46   UKoreaAbdomen Limited Ruq  Result Date: 07/15/2018 CLINICAL DATA:  Initial evaluation for acute abdominal pain. Elevated LFTs. EXAM: ULTRASOUND ABDOMEN LIMITED RIGHT UPPER QUADRANT COMPARISON:  None. FINDINGS: Gallbladder: Distended gallbladder containing internal stones and sludge, largest of which measures approximately 2.1 cm. Gallbladder wall thickened up to 5.6 mm. Positive sonographic Murphy sign elicited on exam. Common bile duct: Diameter:  Dilated up to 16.2 mm, 21 mm distally. There appears to be a 16 mm stone lodged within the distal common bile duct. Intrahepatic biliary dilatation noted. Liver: No focal lesion identified. Within normal limits in parenchymal echogenicity. Portal vein is patent on color Doppler imaging with normal direction of blood flow towards the liver. Incidental note made of right renal cysts. IMPRESSION: 1. Distended gallbladder with internal stones and sludge, with associated wall thickening and positive sonographic Murphy sign. Clinical correlation for acute cholecystitis recommended. 2. Choledocholithiasis with 16 mm stone positioned within the distal common bile duct. Associated prominent intra and extrahepatic biliary dilatation as above. Electronically Signed   By: Jeannine Boga M.D.   On: 07/15/2018 06:28    Review of Systems  Constitutional: Negative for chills and fever.  HENT: Negative for sore throat and tinnitus.   Eyes: Negative for blurred vision and redness.  Respiratory: Negative for cough and shortness of breath.   Cardiovascular: Negative for chest pain, palpitations, orthopnea and PND.  Gastrointestinal: Positive for abdominal pain and vomiting. Negative for diarrhea and nausea.  Genitourinary: Negative for dysuria, frequency and urgency.  Musculoskeletal: Negative for joint pain and myalgias.  Skin: Negative for rash.       No lesions  Neurological: Negative for speech change, focal weakness  and weakness.  Endo/Heme/Allergies: Does not bruise/bleed easily.       No temperature intolerance  Psychiatric/Behavioral: Negative for depression and suicidal ideas.    Blood pressure 119/68, pulse (!) 113, temperature (!) 97.5 F (36.4 C), temperature source Oral, resp. rate (!) 29, SpO2 94 %. Physical Exam  Vitals reviewed. Constitutional: She is oriented to person, place, and time. She appears well-developed and well-nourished.  HENT:  Head: Normocephalic and atraumatic.  Mouth/Throat: Oropharynx is clear and moist.  Eyes: Pupils are equal, round, and reactive to light. Conjunctivae and EOM are normal. No scleral icterus.  Neck: Normal range of motion. Neck supple. No JVD present. No tracheal deviation present. No thyromegaly present.  Cardiovascular: Normal rate, regular rhythm and normal heart sounds. Exam reveals no gallop and no friction rub.  No murmur heard. Respiratory: Effort normal and breath sounds normal.  GI: Soft. Bowel sounds are normal. She exhibits no distension and no mass. There is tenderness. There is no rebound and no guarding.  Genitourinary:  Genitourinary Comments: Deferred  Musculoskeletal: Normal range of motion. She exhibits no edema.  Lymphadenopathy:    She has no cervical adenopathy.  Neurological: She is alert and oriented to person, place, and time. No cranial nerve deficit. She exhibits normal muscle tone.  Skin: Skin is warm and dry. No rash noted. No erythema.  Psychiatric: She has a normal mood and affect. Her behavior is normal. Judgment and thought content normal.     Assessment/Plan This is a an 84 year old female admitted for choledocholithiasis. 1.  Choledocholithiasis: Consult gastroenterology for ERCP.  Patient is n.p.o.  Ceftriaxone in anticipation of possible cholecystectomy later. 2.  Hypertension: Controlled; continue Isordil, lisinopril and amlodipine 3.  Coronary artery disease: Stable.  Elevation in troponin likely secondary to  demand ischemia.  Continue to follow cardiac biomarkers.  Monitor telemetry. 4.  CHF: Chronic; diastolic.  Continue metoprolol 5.  DVT prophylaxis: SCDs 6.  GI prophylaxis: Pantoprazole per home regimen The patient is a full code.  Time spent on admission orders and patient care approximately 45 minutes  Harrie Foreman, MD 07/15/2018, 7:07 AM

## 2018-07-15 NOTE — Progress Notes (Signed)
Talked to Dr. Posey Pronto about patient's coffee ground emesis during the procedure per report, asked if we can check h&h, also BP on a soft side at 102/63. Patient is already in protonix, p.o. RN will continue to monitor.

## 2018-07-15 NOTE — Transfer of Care (Signed)
Immediate Anesthesia Transfer of Care Note  Patient: Ashley [name]  Procedure(s) Performed: ENDOSCOPIC RETROGRADE CHOLANGIOPANCREATOGRAPHY (ERCP) WITH PROPOFOL (N/A )  Patient Location: PACU  Anesthesia Type:General  Level of Consciousness: sedated and responds to stimulation  Airway & Oxygen Therapy: Patient Spontanous Breathing and Patient connected to face mask oxygen  Post-op Assessment: Report given to RN and Post -op Vital signs reviewed and stable  Post vital signs: Reviewed and stable  Last Vitals:  Vitals Value Taken Time  BP 101/63 07/15/2018  3:59 PM  Temp    Pulse 85 07/15/2018  3:59 PM  Resp 20 07/15/2018  3:59 PM  SpO2 100 % 07/15/2018  3:59 PM  Vitals shown include unvalidated device data.  Last Pain:  Vitals:   07/15/18 0845  TempSrc:   PainSc: 0-No pain         Complications: No apparent anesthesia complications

## 2018-07-15 NOTE — Consult Note (Signed)
SURGICAL CONSULTATION NOTE (initial) - cpt: 22633   HISTORY OF PRESENT ILLNESS (HPI):  84 y.o. female presented to [facility] ED today for evaluation of abdominal pain and nausea. Patient reports that she had multiple episodes of dark emesis last night and abdominal pain following dinner which was [employer]. She denied any history of similar pain in the past, but she does have a family history of cholecystitis. She was just discharged  From the hospital on 09/19 following admission for Pulmonary congestion. She denies any chest pain, SOB, fevers, chills, nausea, diarrhea, bowel changes, or leg pain. Previous abdominal surgeries include abdominal hysterectomy.   In the ED, she was found to have transaminitis, elevated T bili, and a dilated CBD on imaging with concern for choledocholithiasis   Surgery is consulted by ED physician Dr. Darel Hong, MD in this context for evaluation and management of choleducolithiasis.  PAST MEDICAL HISTORY (PMH):  Past Medical History:  Diagnosis Date  . Anemia   . Anxiety   . Arthritis   . COPD (chronic obstructive pulmonary disease) ([place])   . Coronary artery disease   . Edema    feet/ankles   wears support hose  . GERD (gastroesophageal reflux disease)   . GI (gastrointestinal bleed)    history  . Hypertension   . Myocardial infarction ([place])   . Peripheral vascular disease ([place])   . Shortness of breath dyspnea      PAST SURGICAL HISTORY ([place]):  Past Surgical History:  Procedure Laterality Date  . ABDOMINAL HYSTERECTOMY    . ABLATION     right leg less than 1 year ago  . CATARACT EXTRACTION W/ INTRAOCULAR LENS  IMPLANT, BILATERAL Bilateral   . CORONARY ANGIOPLASTY     stents  . EYE SURGERY    . FRACTURE SURGERY     right wrist  . TOTAL HIP ARTHROPLASTY Right 02/20/2016   Procedure: TOTAL HIP ARTHROPLASTY ANTERIOR APPROACH;  Surgeon: Hessie Knows, MD;  Location: [facility] ORS;  Service: Orthopedics;  Laterality: Right;     MEDICATIONS:   Prior to Admission medications   Medication Sig Start Date End Date Taking? Authorizing Provider  ALPRAZolam (XANAX) 0.25 MG tablet Take 0.25 mg by mouth 2 (two) times daily. As needed usually just at bedtime   Yes [provider]  amLODipine (NORVASC) 10 MG tablet Take 10 mg by mouth daily.   Yes [provider]  cholecalciferol (VITAMIN D) 1000 units tablet Take 1,000 Units by mouth daily.   Yes [provider]  ibuprofen (ADVIL,MOTRIN) 200 MG tablet Take 1 tablet (200 mg total) by mouth every 8 (eight) hours as needed. 07/14/18  Yes Fritzi Mandes, MD  Ipratropium-Albuterol (COMBIVENT RESPIMAT) 20-100 MCG/ACT AERS respimat Inhale 1 puff into the lungs every 6 (six) hours.   Yes [provider]  isosorbide dinitrate (ISORDIL) 30 MG tablet Take 30 mg by mouth 2 (two) times daily.   Yes [provider]  lisinopril (PRINIVIL,ZESTRIL) 10 MG tablet Take 5 mg by mouth daily.   Yes [provider]  metoprolol succinate (TOPROL-XL) 50 MG 24 hr tablet Take 75 mg by mouth daily. Take with or immediately following a meal.   Yes [provider]  nitroGLYCERIN (NITROSTAT) 0.4 MG SL tablet Place 1 tablet (0.4 mg total) under the tongue every 5 (five) minutes as needed for chest pain. 07/14/18  Yes Fritzi Mandes, MD  omeprazole (PRILOSEC) 20 MG capsule Take 20 mg by mouth daily.   Yes [provider]  simvastatin (ZOCOR) 40 MG tablet Take 40 mg by mouth daily.   Yes [provider]  vitamin B-12 (CYANOCOBALAMIN) 1000 MCG tablet Take 1,000 mcg by mouth daily.   Yes [provider]     ALLERGIES:  No Known Allergies   SOCIAL HISTORY:  Social History   Socioeconomic History  . Marital status: Widowed    Spouse name: Not on file  . Number of children: Not on file  . Years of education: Not on file  . Highest education level: Not on file  Occupational History  . Not on file  Social Needs  . Financial resource strain:  Not on file  . Food insecurity:    Worry: Not on file    Inability: Not on file  . Transportation needs:    Medical: Not on file    Non-medical: Not on file  Tobacco Use  . Smoking status: Former Smoker    Last attempt to quit: 02/11/1997    Years since quitting: 21.4  . Smokeless tobacco: Never Used  Substance and Sexual Activity  . Alcohol use: No  . Drug use: No  . Sexual activity: Not on file  Lifestyle  . Physical activity:    Days per week: Not on file    Minutes per session: Not on file  . Stress: Not on file  Relationships  . Social connections:    Talks on phone: Not on file    Gets together: Not on file    Attends religious service: Not on file    Active member of club or organization: Not on file    Attends meetings of clubs or organizations: Not on file    Relationship status: Not on file  . Intimate partner violence:    Fear of current or ex partner: Not on file    Emotionally abused: Not on file    Physically abused: Not on file    Forced sexual activity: Not on file  Other Topics Concern  . Not on file  Social History Narrative  . Not on file    The patient currently resides (home / rehab facility / nursing home): Home The patient normally is (ambulatory / bedbound): Ambulatory   FAMILY HISTORY:  History reviewed. No pertinent family history.   REVIEW OF SYSTEMS:  Constitutional: denies weight loss, fever, chills, or sweats  Eyes: denies any other vision changes, history of eye injury  ENT: denies sore throat, hearing problems  Respiratory: denies shortness of breath, wheezing  Cardiovascular: denies chest pain, palpitations  Gastrointestinal: + RUQ abdominal pain, + emesis, denies nausea or diarrhea/and bowel function as per HPI Genitourinary: denies burning with urination or urinary frequency Musculoskeletal: denies any other joint pains or cramps  Skin: denies any other rashes or skin discolorations  Neurological: denies any other headache,  dizziness, weakness  Psychiatric: denies any other depression, anxiety   All other review of systems were negative   VITAL SIGNS:  Temp:  [97.5 F (36.4 C)-98.7 F (37.1 C)] 98.7 F (37.1 C) (09/20 0830) Pulse Rate:  [82-116] 108 (09/20 0830) Resp:  [18-43] 32 (09/20 0813) BP: (119-154)/(68-82) 154/74 (09/20 0830) SpO2:  [90 %-98 %] 91 % (09/20 0830) Weight:  [74.4 kg] 74.4 kg (09/20 0830)     Height: 4\' 11"  (149.9 cm) Weight: 74.4 kg BMI (Calculated): 33.11   INTAKE/OUTPUT:  This shift: No intake/output data recorded.  Last 2 shifts: @IOLAST2SHIFTS @   PHYSICAL EXAM:  Constitutional:  -- Normal body habitus  -- Awake,  alert, and oriented x3, no apparent distress Eyes:  -- Pupils equally round and reactive to light  -- mild scleral icterus Ear, nose, throat: -- Neck is FROM WNL Pulmonary:  -- No wheezes or rhales -- Equal breath sounds bilaterally -- Breathing non-labored at rest Cardiovascular:  -- tachycardic but regular -- S1, S2 present  -- No pericardial rubs  Gastrointestinal:  -- Abdomen soft, RUQ tednerness, non-distended, no guarding or rebound tenderness -- Negative Murphy's Sign -- No abdominal masses appreciated, pulsatile or otherwise  Musculoskeletal and Integumentary:  -- Wounds or skin discoloration: ecchymosis to RLQ, likely from Lovenox from prior admission -- Extremities: B/L UE and LE FROM, hands and feet warm, no edema  Neurologic:  -- Motor function: Intact and symmetric -- Sensation: Intact and symmetric Psychiatric:  -- Mood and affect WNL      Labs:  CBC Latest Ref Rng & Units 07/15/2018 07/13/2018 07/12/2018  WBC 3.6 - 11.0 K/uL 12.3(H) 17.6(H) 12.0(H)  Hemoglobin 12.0 - 16.0 g/dL 13.1 13.0 13.7  Hematocrit 35.0 - 47.0 % 40.2 40.3 41.7  Platelets 150 - 440 K/uL 104(L) 142(L) 156   CMP Latest Ref Rng & Units 07/15/2018 07/14/2018 07/13/2018  Glucose 70 - 99 mg/dL 130(H) 134(H) 186(H)  BUN 8 - 23 mg/dL 33(H) 33(H) 20  Creatinine 0.44 -  1.00 mg/dL 0.84 0.80 0.73  Sodium 135 - 145 mmol/L 136 138 141  Potassium 3.5 - 5.1 mmol/L 3.3(L) 4.0 2.7(LL)  Chloride 98 - 111 mmol/L 103 103 104  CO2 22 - 32 mmol/L 20(L) 27 27  Calcium 8.9 - 10.3 mg/dL 8.6(L) 8.8(L) 9.2  Total Protein 6.5 - 8.1 g/dL 6.3(L) - -  Total Bilirubin 0.3 - 1.2 mg/dL 6.6(H) - -  Alkaline Phos 38 - 126 U/L 330(H) - -  AST 15 - 41 U/L 256(H) - -  ALT 0 - 44 U/L 449(H) - -    Imaging studies:  RUQ [place] from 07/15/18:   IMPRESSION: 1. Distended gallbladder with internal stones and sludge, with associated wall thickening and positive sonographic Murphy sign. Clinical correlation for acute cholecystitis recommended. 2. Choledocholithiasis with 16 mm stone positioned within the distal common bile duct. Associated prominent intra and extrahepatic biliary dilatation as above.  CT Abdomen/Pelvis from 07/15/18:   IMPRESSION: 1. Large esophageal hiatal hernia. 2. Severe intra and extrahepatic bile duct dilatation with choledocholithiasis. 3. Cholelithiasis with distended gallbladder and mild gallbladder wall thickening and edema, likely cholecystitis. 4. Diverticulosis of the sigmoid colon without evidence of diverticulitis. 5. Tortuous and dilated abdominal aorta with calcification measuring up to 2.3 cm diameter.    Assessment/Plan: (ICD-10's: K80.50, R74.0) 84 y.o. female with choledocholithiasis without acute cholecysitis, complicated by pertinent comorbidities including transaminitis, elevated T Bilirubin, anemia, COPD, CAD, HTN, MI Hx, PVD, HTN, and advanced age.   - No indication at this time for emergent surgical intervention. She needs ERCP first and interval cholecystectomy can be done later during this admission or as an outpatient  - Gastroenterology following for ERCP, appreciate their help  - NPO for now, IVF, anti-emetics  - Medicine team primary, appreciate their help   - DVT prophylaxis  All of the above findings and recommendations  were discussed with the patient and her family, and all of patient's and her family's questions were answered to their expressed satisfaction.  Thank you for the opportunity to participate in this patient's care.   -- Edison Simon, PA-C [facility] 07/15/2018, 10:28 AM [phone] M-F: 7am - 4pm

## 2018-07-15 NOTE — Anesthesia Procedure Notes (Signed)
Procedure Name: Intubation Performed by: Lance Muss, CRNA Pre-anesthesia Checklist: Patient identified, Patient being monitored, Timeout performed, Emergency Drugs available and Suction available Patient Re-evaluated:Patient Re-evaluated prior to induction Oxygen Delivery Method: Circle system utilized Preoxygenation: Pre-oxygenation with 100% oxygen Induction Type: IV induction, Cricoid Pressure applied and Rapid sequence Ventilation: Mask ventilation without difficulty Laryngoscope Size: Mac and 3 Grade View: Grade I Tube type: Oral Tube size: 7.0 mm Number of attempts: 1 Airway Equipment and Method: Stylet Placement Confirmation: ETT inserted through vocal cords under direct vision,  positive ETCO2 and breath sounds checked- equal and bilateral Secured at: 20 cm Tube secured with: Tape Dental Injury: Teeth and Oropharynx as per pre-operative assessment

## 2018-07-15 NOTE — Anesthesia Post-op Follow-up Note (Signed)
Anesthesia QCDR form completed.        

## 2018-07-15 NOTE — Anesthesia Preprocedure Evaluation (Addendum)
Anesthesia Evaluation  Patient identified by MRN, date of birth, ID band Patient awake    Reviewed: Allergy & Precautions, H&P , NPO status , Patient's Chart, lab work & pertinent test results  History of Anesthesia Complications Negative for: history of anesthetic complications  Airway Mallampati: III  TM Distance: >3 FB Neck ROM: limited    Dental  (+) Poor Dentition, Edentulous Upper, Edentulous Lower   Pulmonary shortness of breath and with exertion, COPD, former smoker,           Cardiovascular Exercise Tolerance: Good hypertension, + CAD, + Past MI and + Peripheral Vascular Disease  (-) CHF      Neuro/Psych PSYCHIATRIC DISORDERS negative neurological ROS     GI/Hepatic Neg liver ROS, GERD  ,  Endo/Other  negative endocrine ROS  Renal/GU      Musculoskeletal  (+) Arthritis ,   Abdominal   Peds  Hematology negative hematology ROS (+)   Anesthesia Other Findings Past Medical History: No date: Anemia No date: Anxiety No date: Arthritis No date: COPD (chronic obstructive pulmonary disease) ([facility]) No date: Coronary artery disease No date: Edema     Comment:  feet/ankles   wears support hose No date: GERD (gastroesophageal reflux disease) No date: GI (gastrointestinal bleed)     Comment:  history No date: Hypertension No date: Myocardial infarction ([place]) No date: Peripheral vascular disease ([facility]) No date: Shortness of breath dyspnea  Past Surgical History: No date: ABDOMINAL HYSTERECTOMY No date: ABLATION     Comment:  right leg less than 1 year ago No date: CATARACT EXTRACTION W/ INTRAOCULAR LENS  IMPLANT, BILATERAL;  Bilateral No date: CORONARY ANGIOPLASTY     Comment:  stents No date: EYE SURGERY No date: FRACTURE SURGERY     Comment:  right wrist 02/20/2016: TOTAL HIP ARTHROPLASTY; Right     Comment:  Procedure: TOTAL HIP ARTHROPLASTY ANTERIOR APPROACH;                Surgeon: Hessie Knows, MD;   Location: [facility] ORS;  Service:              Orthopedics;  Laterality: Right;  BMI    Body Mass Index:  33.12 kg/m      Reproductive/Obstetrics negative OB ROS                             Anesthesia Physical Anesthesia Plan  ASA: IV and emergent  Anesthesia Plan: General ETT   Post-op Pain Management:    Induction: Intravenous  PONV Risk Score and Plan: Ondansetron, Dexamethasone, Midazolam and Treatment may vary due to age or medical condition  Airway Management Planned: Oral ETT  Additional Equipment:   Intra-op Plan:   Post-operative Plan: Extubation in OR  Informed Consent: I have reviewed the patients History and Physical, chart, labs and discussed the procedure including the risks, benefits and alternatives for the proposed anesthesia with the patient or authorized representative who has indicated his/her understanding and acceptance.   Dental Advisory Given  Plan Discussed with: Anesthesiologist, CRNA and Surgeon  Anesthesia Plan Comments: (Plan to suspend DNR for procedure.  Patient and family voiced understanding.  Patient and family informed that patient is higher risk for complications from anesthesia during this procedure due to their medical history and age including but not limited to post operative cognitive dysfunction.  They voiced understanding.  Patient consented for risks of anesthesia including but not limited to:  - adverse reactions  to medications - damage to teeth, lips or other oral mucosa - sore throat or hoarseness - Damage to heart, brain, lungs or loss of life  Patient voiced understanding.)        Anesthesia Quick Evaluation

## 2018-07-15 NOTE — ED Triage Notes (Signed)
Pt here from home , c/p of vomiting just dont feel good .

## 2018-07-15 NOTE — Progress Notes (Signed)
Family Meeting Note  Advance Directive:yes  Today a meeting took place with the pt ans dter  Patient admitted with nausea vomiting abdominal pain found to have gallstones and CBD stone. She has history of coronary artery disease and COPD. Recently workup for chest pain was negative. Patient will need ERCP and possible gallbladder removal. Code status was discussed with patient with daughter being in the room. Patient wishes to be a DNR. This was confirmed with the daughter and she agrees with her mother's decision. Time spent during discussion:n 16 mins Fritzi Mandes, MD

## 2018-07-15 NOTE — ED Provider Notes (Addendum)
[facility] Emergency Department Provider Note  ____________________________________________   First MD Initiated Contact with Patient 07/15/18 [phone]     (approximate)  I have reviewed the triage vital signs and the nursing notes.   HISTORY  Chief Complaint Emesis    HPI Ashley [name] is a 84 y.o. female who comes to the emergency department via EMS with generalized malaise nausea and vomiting for the past 2 days or so.  She was seen in our emergency department 2 days ago by Dr. Jerl Santos and admitted to the hospital for chest pain.  She ruled out for myocardial ischemia and had a normal echocardiogram.  She was discharged with outpatient stress test scheduled.  She says ever since going home she has felt generalized malaise and "more tired".  She has vomited several times and has had difficulty keeping food down.  She has no history of abdominal surgeries.  She does have a history of COPD.  She feels somewhat short of breath.  She is not sure if she is gained weight.  She is able to lie flat.  She does not have much particular abdominal discomfort.  Her symptoms came on gradually are now constant worse when trying to eat and improved when not.    Past Medical History:  Diagnosis Date  . Anemia   . Anxiety   . Arthritis   . COPD (chronic obstructive pulmonary disease) ([place])   . Coronary artery disease   . Edema    feet/ankles   wears support hose  . GERD (gastroesophageal reflux disease)   . GI (gastrointestinal bleed)    history  . Hypertension   . Myocardial infarction ([place])   . Peripheral vascular disease ([place])   . Shortness of breath dyspnea     Patient Active Problem List   Diagnosis Date Noted  . Chest pain 07/13/2018  . Primary osteoarthritis of right hip 02/20/2016    Past Surgical History:  Procedure Laterality Date  . ABDOMINAL HYSTERECTOMY    . ABLATION     right leg less than 1 year ago  . CATARACT EXTRACTION W/ INTRAOCULAR LENS   IMPLANT, BILATERAL Bilateral   . CORONARY ANGIOPLASTY     stents  . EYE SURGERY    . FRACTURE SURGERY     right wrist  . TOTAL HIP ARTHROPLASTY Right 02/20/2016   Procedure: TOTAL HIP ARTHROPLASTY ANTERIOR APPROACH;  Surgeon: Hessie Knows, MD;  Location: [facility] ORS;  Service: Orthopedics;  Laterality: Right;    Prior to Admission medications   Medication Sig Start Date End Date Taking? Authorizing Provider  ALPRAZolam (XANAX) 0.25 MG tablet Take 0.25 mg by mouth 2 (two) times daily. As needed usually just at bedtime   Yes [provider]  amLODipine (NORVASC) 10 MG tablet Take 10 mg by mouth daily.   Yes [provider]  cholecalciferol (VITAMIN D) 1000 units tablet Take 1,000 Units by mouth daily.   Yes [provider]  ibuprofen (ADVIL,MOTRIN) 200 MG tablet Take 1 tablet (200 mg total) by mouth every 8 (eight) hours as needed. 07/14/18  Yes Fritzi Mandes, MD  Ipratropium-Albuterol (COMBIVENT RESPIMAT) 20-100 MCG/ACT AERS respimat Inhale 1 puff into the lungs every 6 (six) hours.   Yes [provider]  isosorbide dinitrate (ISORDIL) 30 MG tablet Take 30 mg by mouth 2 (two) times daily.   Yes [provider]  lisinopril (PRINIVIL,ZESTRIL) 10 MG tablet Take 5 mg by mouth daily.   Yes [provider]  metoprolol succinate (TOPROL-XL) 50 MG 24 hr tablet Take 75 mg by mouth daily. Take with or immediately following a meal.   Yes [provider]  nitroGLYCERIN (NITROSTAT) 0.4 MG SL tablet Place 1 tablet (0.4 mg total) under the tongue every 5 (five) minutes as needed for chest pain. 07/14/18  Yes Fritzi Mandes, MD  omeprazole (PRILOSEC) 20 MG capsule Take 20 mg by mouth daily.   Yes [provider]  simvastatin (ZOCOR) 40 MG tablet Take 40 mg by mouth daily.   Yes [provider]  vitamin B-12 (CYANOCOBALAMIN) 1000 MCG tablet Take 1,000 mcg by mouth daily.   Yes [provider]    Allergies Patient has no known  allergies.  No family history on file.  Social History Social History   Tobacco Use  . Smoking status: Former Smoker    Last attempt to quit: 02/11/1997    Years since quitting: 21.4  . Smokeless tobacco: Never Used  Substance Use Topics  . Alcohol use: No  . Drug use: No    Review of Systems Constitutional: No fever/chills Eyes: No visual changes. ENT: No sore throat. Cardiovascular: Denies chest pain. Respiratory: Positive for shortness of breath. Gastrointestinal: Positive for abdominal pain.  Positive for nausea, positive for vomiting.  No diarrhea.  No constipation. Genitourinary: Negative for dysuria. Musculoskeletal: Negative for back pain. Skin: Negative for rash. Neurological: Negative for headaches, focal weakness or numbness.   ____________________________________________   PHYSICAL EXAM:  VITAL SIGNS: ED Triage Vitals  Enc Vitals Group     BP      Pulse      Resp      Temp      Temp src      SpO2      Weight      Height      Head Circumference      Peak Flow      Pain Score      Pain Loc      Pain Edu?      Excl. in [place]?     Constitutional: Alert and oriented x4 uncomfortable appearing with elevated respiratory rate holding her abdomen Eyes: PERRL EOMI. Head: Atraumatic. Nose: No congestion/rhinnorhea. Mouth/Throat: No trismus Neck: No stridor.  Able to lie completely flat with no JVD Cardiovascular: Tachycardic rate, regular rhythm. Grossly normal heart sounds.  Good peripheral circulation. Respiratory: Increased respiratory effort but no accessory muscle use.  Decreased breath sounds on the left base Gastrointestinal: Soft diffuse mild upper abdominal tenderness with no rebound or guarding no peritonitis negative Murphy's Musculoskeletal: No lower extremity edema   Neurologic:  Normal speech and language. No gross focal neurologic deficits are appreciated. Skin: Appears slightly jaundiced Psychiatric: Mood and affect are normal. Speech and  behavior are normal.    ____________________________________________   DIFFERENTIAL includes but not limited to  Dehydration, fluid overload, metabolic derangement, cholecystitis, choledocholithiasis, pancreatitis, acute coronary syndrome ____________________________________________   LABS (all labs ordered are listed, but only abnormal results are displayed)  Labs Reviewed  COMPREHENSIVE METABOLIC PANEL - Abnormal; Notable for the following components:      Result Value   Potassium 3.3 (*)    CO2 20 (*)    Glucose, Bld 130 (*)    BUN 33 (*)    Calcium 8.6 (*)    Total Protein 6.3 (*)    Albumin 3.4 (*)    AST 256 (*)    ALT 449 (*)    Alkaline Phosphatase 330 (*)  Total Bilirubin 6.6 (*)    All other components within normal limits  TROPONIN I - Abnormal; Notable for the following components:   Troponin I 0.06 (*)    All other components within normal limits  CBC WITH DIFFERENTIAL/PLATELET - Abnormal; Notable for the following components:   WBC 12.3 (*)    MCV 79.4 (*)    RDW 16.8 (*)    Platelets 104 (*)    Neutro Abs 11.5 (*)    Lymphs Abs 0.4 (*)    All other components within normal limits  BRAIN NATRIURETIC PEPTIDE - Abnormal; Notable for the following components:   B Natriuretic Peptide 478.0 (*)    All other components within normal limits  LIPASE, BLOOD  URINALYSIS, COMPLETE (UACMP) WITH MICROSCOPIC  BILIRUBIN, DIRECT    Lab work reviewed by me with a number of abnormalities.  Most notably elevated transaminases and bilirubin concerning for biliary obstruction.  She also has an elevated BNP and troponin consistent with pulmonary edema and stretch __________________________________________  EKG  ED ECG REPORT I, Darel Hong, the attending physician, personally viewed and interpreted this ECG.  Date: 07/15/2018 EKG Time:  Rate: 105 Rhythm: Sinus tachycardia QRS Axis: normal Intervals: Leftward axis ST/T Wave abnormalities: Nonspecific ST  changes inferiorly although unchanged from previous EKG performed 2 days ago Narrative Interpretation: no evidence of acute ischemia  ____________________________________________  RADIOLOGY  Chest x-ray reviewed by me shows mild pulmonary vascular congestion along with left-sided pleural effusion Right upper quadrant ultrasound reviewed by me choledocholithiasis as well as cholecystitis. CT scan of the abdomen pelvis reviewed by me consistent with cholecystitis and ductal dilation no other disease noted ____________________________________________   PROCEDURES  Procedure(s) performed: no  .Critical Care Performed by: Darel Hong, MD Authorized by: Darel Hong, MD   Critical care provider statement:    Critical care time (minutes):  30   Critical care time was exclusive of:  Separately billable procedures and treating other patients   Critical care was necessary to treat or prevent imminent or life-threatening deterioration of the following conditions:  Hepatic failure   Critical care was time spent personally by me on the following activities:  Development of treatment plan with patient or surrogate, discussions with consultants, evaluation of patient's response to treatment, examination of patient, obtaining history from patient or surrogate, ordering and performing treatments and interventions, ordering and review of laboratory studies, ordering and review of radiographic studies, pulse oximetry, re-evaluation of patient's condition and review of old charts    Critical Care performed: Yes  ____________________________________________   INITIAL IMPRESSION / ASSESSMENT AND PLAN / ED COURSE  Pertinent labs & imaging results that were available during my care of the patient were reviewed by me and considered in my medical decision making (see chart for details).   As part of my medical decision making, I reviewed the following data within the [facility]  History obtained from family if available, nursing notes, old chart and ekg, as well as notes from prior ED visits.  Differential on an elderly woman with nausea vomiting and malaise in addition to shortness of breath is quite broad.  We initially ordered IV fluids however chest x-ray came back concerning for pulmonary edema so I stopped them after 100 cc.  We will give her 2 breathing treatments as well as Solu-Medrol given her history of COPD and defer further treatment pending lab work.  5 mg of Compazine now for nausea.    ----------------------------------------- 5:19 AM on 07/15/2018 -----------------------------------------  The patient's LFTs are back showing transaminitis with an elevated bilirubin.  Differential is broad but have to consider choledocholithiasis versus pancreatic cancer etc.  CT scan of the abdomen pelvis with IV contrast in addition to ultrasound are pending.  I will add on direct bilirubin.  ----------------------------------------- 5:56 AM on 07/15/2018 -----------------------------------------  Following 5 mg of Compazine the patient's nausea significantly improved.  I went over to the ultrasound while she is getting the procedure performed and she does have gallstones but the gallbladder wall is normal with no pericholecystic fluid.  Her common bile duct does appear enlarged which would be consistent with choledocholithiasis.  At this point she requires inpatient admission for management of her acute choledocholithiasis.  The patient and family verbalized understanding and agreement with the plan.  I discussed with the hospitalist Dr. Marcille Blanco who has graciously agreed to admit the patient to his service.  Dr. Allen Norris of gastroenterology is on call later today, however I will defer consult to the morning as this does not require emergent treatment tonight.  ____________________________________________ ----------------------------------------- 6:41 AM on  07/15/2018 -----------------------------------------  Ultrasound is being formally read as concern for cholecystitis.  Still think the patient is appropriate to admit to medicine however I have a call out to general surgery now and I will cover her with a dose of ceftriaxone.  ----------------------------------------- 6:48 AM on 07/15/2018 -----------------------------------------  I spoke with Dr. Dahlia Byes who will kindly consult on the case.  He recommends ERCP prior to any surgical treatment.  I have updated Dr. Marcille Blanco.  FINAL CLINICAL IMPRESSION(S) / ED DIAGNOSES  Final diagnoses:  Acute pulmonary edema (HCC)  Elevated troponin  Malaise  Pain  Choledocholithiasis      NEW MEDICATIONS STARTED DURING THIS VISIT:  New Prescriptions   No medications on file     Note:  This document was prepared using Dragon voice recognition software and may include unintentional dictation errors.     Darel Hong, MD 07/15/18 1324    Darel Hong, MD 07/15/18 4010    Darel Hong, MD 07/15/18 [phone]

## 2018-07-15 NOTE — ED Notes (Signed)
Pt daughter states pt "spit up" dark brown/black substance 10 min ago. Small amount of dark brown fluid observed on wash rag

## 2018-07-15 NOTE — Progress Notes (Addendum)
[place] [facility] [place] NAME: Ashley [name]    MR#:  [id number]  DATE OF BIRTH:  1931-02-23  SUBJECTIVE:   Patient was discharged yesterday went back home had some [employer] brought in by family. She aided and then started having abdominal discomfort with right upper quadrant and vomited a few times. She was brought in to the ER found to have coleader cholelithiasis and gallstones. She is not in respiratory distress. She appears very tired since she had spent a whole night in the ER. No respiratory distress or chest pain present.  Daughter in the room REVIEW OF SYSTEMS:   Review of Systems  Constitutional: Negative for chills, fever and weight loss.  HENT: Negative for ear discharge, ear pain and nosebleeds.   Eyes: Negative for blurred vision, pain and discharge.  Respiratory: Negative for sputum production, wheezing and stridor.   Cardiovascular: Negative for chest pain, palpitations, orthopnea and PND.  Gastrointestinal: Positive for abdominal pain and nausea. Negative for diarrhea and vomiting.  Genitourinary: Negative for frequency and urgency.  Musculoskeletal: Negative for back pain and joint pain.  Neurological: Negative for sensory change, speech change, focal weakness and weakness.  Psychiatric/Behavioral: Negative for depression and hallucinations. The patient is not nervous/anxious.    Tolerating Diet:npo Tolerating PT: ambulatory  DRUG ALLERGIES:  No Known Allergies  VITALS:  Blood pressure (!) 154/74, pulse (!) 108, temperature 98.7 F (37.1 C), temperature source Oral, resp. rate (!) 32, height 4\' 11"  (1.499 m), weight 74.4 kg, SpO2 91 %.  PHYSICAL EXAMINATION:   Physical Exam  GENERAL:  84 y.o.-year-old patient lying in the bed with no acute distress.  EYES: Pupils equal, round, reactive to light and accommodation. No scleral icterus. Extraocular muscles intact.  HEENT: Head atraumatic, normocephalic. Oropharynx and  nasopharynx clear.  NECK:  Supple, no jugular venous distention. No thyroid enlargement, no tenderness.  LUNGS: Normal breath sounds bilaterally, no wheezing, no rales, no rhonchi. No use of accessory muscles of respiration.  CARDIOVASCULAR: S1, S2 normal. No murmurs, rubs, or gallops.  ABDOMEN: Soft, mild tenderness +, nondistended. Bowel sounds present. No organomegaly or mass.  EXTREMITIES: No cyanosis, clubbing or edema b/l.    NEUROLOGIC: Cranial nerves II through XII are intact. No focal Motor or sensory deficits b/l.   PSYCHIATRIC:  patient is alert and oriented x 3.  SKIN: No obvious rash, lesion, or ulcer.   LABORATORY PANEL:  CBC Recent Labs  Lab 07/15/18 0411  WBC 12.3*  HGB 13.1  HCT 40.2  PLT 104*    Chemistries  Recent Labs  Lab 07/15/18 0411  NA 136  K 3.3*  CL 103  CO2 20*  GLUCOSE 130*  BUN 33*  CREATININE 0.84  CALCIUM 8.6*  AST 256*  ALT 449*  ALKPHOS 330*  BILITOT 6.6*   Cardiac Enzymes Recent Labs  Lab 07/15/18 0411  TROPONINI 0.06*   RADIOLOGY:  Ct Abdomen Pelvis W Contrast  Result Date: 07/15/2018 CLINICAL DATA:  Vomiting. EXAM: CT ABDOMEN AND PELVIS WITH CONTRAST TECHNIQUE: Multidetector CT imaging of the abdomen and pelvis was performed using the standard protocol following bolus administration of intravenous contrast. CONTRAST:  125mL ISOVUE-300 IOPAMIDOL (ISOVUE-300) INJECTION 61% COMPARISON:  Ultrasound right upper quadrant 07/15/2018 FINDINGS: Lower chest: Scarring in the lung bases. Large esophageal hiatal hernia. Coronary artery calcifications. Hepatobiliary: There is severe intra and extrahepatic bile duct dilatation with extrahepatic bile ducts measuring about 2.5 cm in diameter. Focal density within the common bile duct  likely representing a stone and measuring about 11 mm in diameter. The gallbladder is markedly distended with mild wall thickening and edema. Small stones are also seen in the gallbladder. No focal liver lesions.  Pancreas: Unremarkable. No pancreatic ductal dilatation or surrounding inflammatory changes. Spleen: Normal in size without focal abnormality. Adrenals/Urinary Tract: No adrenal gland nodules. Multiple bilateral renal cysts. Nephrograms are homogeneous and symmetrical. No hydronephrosis or hydroureter. Bladder is unremarkable. Stomach/Bowel: Stomach, small bowel, and colon are not abnormally distended. Scattered diverticula in the sigmoid colon. No evidence of diverticulitis. No wall thickening or inflammatory changes. Appendix is not identified. Vascular/Lymphatic: Tortuous and dilated aorta with calcification. Aortic diameter measures about 2.3 cm maximum. Extensive vascular calcifications throughout the abdomen and pelvis. Reproductive: Status post hysterectomy. No adnexal masses. Other: No free air or free fluid in the abdomen. Abdominal wall musculature appears intact. Musculoskeletal: Postoperative right hip arthroplasty. Degenerative changes in the spine. No destructive bone lesions. IMPRESSION: 1. Large esophageal hiatal hernia. 2. Severe intra and extrahepatic bile duct dilatation with choledocholithiasis. 3. Cholelithiasis with distended gallbladder and mild gallbladder wall thickening and edema, likely cholecystitis. 4. Diverticulosis of the sigmoid colon without evidence of diverticulitis. 5. Tortuous and dilated abdominal aorta with calcification measuring up to 2.3 cm diameter. Electronically Signed   By: Lucienne Capers M.D.   On: 07/15/2018 06:37   Dg Chest Port 1 View  Result Date: 07/15/2018 CLINICAL DATA:  Initial evaluation for acute shortness of breath. EXAM: PORTABLE CHEST 1 VIEW COMPARISON:  Prior radiograph from 07/12/2018 FINDINGS: Moderate cardiomegaly. Mediastinal silhouette normal. Aortic atherosclerosis. Large hiatal hernia again noted. Lungs are hypoinflated. Diffuse vascular and interstitial prominence, suggesting mild pulmonary interstitial congestion/edema. Suspected small left  pleural effusion. Streaky left basilar opacity favored to reflect atelectatic changes. No other focal airspace disease. No pneumothorax. No acute osseous abnormality. IMPRESSION: 1. Cardiomegaly with mild diffuse pulmonary interstitial edema. 2. Probable small left pleural effusion. Associated streaky left basilar opacity favored to reflect atelectasis and/or congestion. 3. Large hiatal hernia. 4. Aortic atherosclerosis. Electronically Signed   By: Jeannine Boga M.D.   On: 07/15/2018 04:46   US Abdomen Limited Ruq  Result Date: 07/15/2018 CLINICAL DATA:  Initial evaluation for acute abdominal pain. Elevated LFTs. EXAM: ULTRASOUND ABDOMEN LIMITED RIGHT UPPER QUADRANT COMPARISON:  None. FINDINGS: Gallbladder: Distended gallbladder containing internal stones and sludge, largest of which measures approximately 2.1 cm. Gallbladder wall thickened up to 5.6 mm. Positive sonographic Murphy sign elicited on exam. Common bile duct: Diameter: Dilated up to 16.2 mm, 21 mm distally. There appears to be a 16 mm stone lodged within the distal common bile duct. Intrahepatic biliary dilatation noted. Liver: No focal lesion identified. Within normal limits in parenchymal echogenicity. Portal vein is patent on color Doppler imaging with normal direction of blood flow towards the liver. Incidental note made of right renal cysts. IMPRESSION: 1. Distended gallbladder with internal stones and sludge, with associated wall thickening and positive sonographic Murphy sign. Clinical correlation for acute cholecystitis recommended. 2. Choledocholithiasis with 16 mm stone positioned within the distal common bile duct. Associated prominent intra and extrahepatic biliary dilatation as above. Electronically Signed   By: Jeannine Boga M.D.   On: 07/15/2018 06:28   ASSESSMENT AND PLAN:   NevaGuthrieis a77 y.o.femalewith a known history of coronary artery disease status post MI in the past, COPD, PAD, hypertension, anemia  and other comorbidities. Patient presented to emergency room for acute onset of nausea vomiting and right upper quadrant abdominal pain.   1.  acute Choledocholithiasis/cholelithiasis: -patient came in with abdominal pain and several episodes of vomiting after she was discharged at home. She is currently not complaining of any abdominal pain or nausea. She has no respiratory distress or any chest pain. - Consult gastroenterology for ERCP.Patient is n.p.o. spoke with Dr.wohl -  Ceftriaxone in anticipation of possible cholecystectomy later. -Seen by surgery  2.  Hypertension: Controlled; continue Isordil, lisinopril and amlodipine  3.  Coronary artery disease: Stable.   -Elevation in troponin likely secondary to demand ischemia. Pt clinically does not appear to be  in heart failure. - Continue to follow cardiac biomarkers.   -will give 1 dose lasix empirically -ECH0 done 2 days ago negative for WMA and was seen by cardiology -spoke with DR fath--will see pt  4.  CHF: Chronic; diastolic.  Continue metoprolol clinically does not appear to be in HF  5.  DVT prophylaxis: SCDs   Case discussed with Care Management/Social Worker. Management plans discussed with the patient, family and they are in agreement.  CODE STATUS: DNR  DVT Prophylaxis: SCD  TOTAL TIME TAKING CARE OF THIS PATIENT: 30 minutes.  >50% time spent on counselling and coordination of care  POSSIBLE D/C IN 2-3 DAYS, DEPENDING ON CLINICAL CONDITION.  Note: This dictation was prepared with Dragon dictation along with smaller phrase technology. Any transcriptional errors that result from this process are unintentional.  Fritzi Mandes M.D on 07/15/2018 at 10:22 AM  Between 7am to 6pm - Pager - [phone]  After 6pm go to [email or web address] - password [email or web address] [facility]  Office  [phone]  CC: Primary care physician; Albina Billet, MDPatient ID: Ashley [name], female   DOB: [birth year], 84 y.o.    MRN: [id number]

## 2018-07-15 NOTE — Op Note (Signed)
[facility]enterology Patient Name: Ashley [name] Procedure Date: 07/15/2018 2:45 PM MRN: [id number] Account #: [id number] Date of Birth: [birth year] Admit Type: Inpatient Age: 84 Room: [facility] ENDO ROOM 4 Gender: Female Note Status: Finalized Procedure:            ERCP Indications:          Bile duct stone(s) Providers:            [name] MD, MD Referring MD:         Leona Carry. Hall Busing, MD (Referring MD) Medicines:            General Anesthesia Complications:        No immediate complications. Procedure:            Pre-Anesthesia Assessment:                       - Prior to the procedure, a History and Physical was                        performed, and patient medications and allergies were                        reviewed. The patient's tolerance of previous                        anesthesia was also reviewed. The risks and benefits of                        the procedure and the sedation options and risks were                        discussed with the patient. All questions were                        answered, and informed consent was obtained. Prior                        Anticoagulants: The patient has taken no previous                        anticoagulant or antiplatelet agents. ASA Grade                        Assessment: II - A patient with mild systemic disease.                        After reviewing the risks and benefits, the patient was                        deemed in satisfactory condition to undergo the                        procedure.                       After obtaining informed consent, the scope was passed                        under direct vision. Throughout the procedure, the  patient's blood pressure, pulse, and oxygen saturations                        were monitored continuously. The Duodenoscope was                        introduced through the mouth, and used to inject                        contrast into and  used to inject contrast into the bile                        duct. The ERCP was accomplished without difficulty. The                        patient tolerated the procedure well. Findings:      The scout film was normal. The major papilla was bulging. The bile duct       was deeply cannulated with the short-nosed traction sphincterotome.       Contrast was injected. I personally interpreted the bile duct images.       There was brisk flow of contrast through the ducts. Image quality was       excellent. Contrast extended to the entire biliary tree. The main bile       duct contained two stones, the largest of which was 10 mm in diameter.       The main bile duct was moderately dilated and diffusely dilated. The       largest diameter was 15 mm. A wire was passed into the biliary tree. A       10 mm biliary sphincterotomy was made with a traction (standard)       sphincterotome using ERBE electrocautery. The sphincterotomy oozed       blood. The biliary tree was swept with a 15 mm balloon starting at the       bifurcation. All stones were removed. Nothing was found. Impression:           - The major papilla appeared to be bulging.                       - The entire main bile duct was moderately dilated.                       - Choledocholithiasis was found. Complete removal was                        accomplished by biliary sphincterotomy and balloon                        extraction.                       - A biliary sphincterotomy was performed.                       - The biliary tree was swept and nothing was found. Recommendation:       - Avoid aspirin and nonsteroidal anti-inflammatory                        medicines.                       -  Clear liquid diet today.                       - Continue present medications.                       - Watch for pancreatitis, bleeding, perforation, and                        cholangitis. Procedure Code(s):    --- Professional ---                        [phone], Endoscopic retrograde cholangiopancreatography                        (ERCP); with removal of calculi/debris from                        biliary/pancreatic duct(s)                       [id number], Endoscopic retrograde cholangiopancreatography                        (ERCP); with sphincterotomy/papillotomy                       [phone], Endoscopic catheterization of the biliary ductal                        system, radiological supervision and interpretation Diagnosis Code(s):    --- Professional ---                       K80.50, Calculus of bile duct without cholangitis or                        cholecystitis without obstruction CPT copyright 2017 American Medical Association. All rights reserved. The codes documented in this report are preliminary and upon coder review may  be revised to meet current compliance requirements. [name] MD, MD 07/15/2018 3:42:45 PM This report has been signed electronically. Number of Addenda: 0 Note Initiated On: 07/15/2018 2:45 PM      [facility]

## 2018-07-15 NOTE — Consult Note (Signed)
Cardiology Consultation Note    Patient ID: Ashley [name], MRN: [id number], DOB/AGE: 82-Feb-1932 82 y.o. Admit date: 07/15/2018   Date of Consult: 07/15/2018 Primary Physician: Albina Billet, MD Primary Cardiologist: Dr. Ubaldo Glassing  Chief Complaint: nausea and vomiting Reason for Consultation: abnormal troponin Requesting MD: Dr. Fritzi Mandes  HPI: Ashley [name] is a 84 y.o. female with history of hypertension, coronary artery disease was admitted after an episode of nausea and vomiting.  She was noted to have acute cholecystitis with cholelithiasis and bile duct dilatation.  She also has a large esophageal hiatal hernia.  EKG shows sinus rhythm with nonspecific ST-T wave changes.  Admitted with acute diastolic heart failure and COPD.  Echocardiogram during this admission showed a normal ejection fraction of 55 to 65% with no significant valvular abnormalities.  She returned to the emergency room after being discharged with her nausea and vomiting.  Some mild abdominal pain but no chest pain.  Troponin was ordered per ER protocol which revealed a value of 0.06.  BNP is 478.  She will need urgent ERCP.  Elevated troponin appears to be demand as she is not having exertional chest pain.  Past Medical History:  Diagnosis Date  . Anemia   . Anxiety   . Arthritis   . COPD (chronic obstructive pulmonary disease) ([place])   . Coronary artery disease   . Edema    feet/ankles   wears support hose  . GERD (gastroesophageal reflux disease)   . GI (gastrointestinal bleed)    history  . Hypertension   . Myocardial infarction ([place])   . Peripheral vascular disease ([place])   . Shortness of breath dyspnea       Surgical History:  Past Surgical History:  Procedure Laterality Date  . ABDOMINAL HYSTERECTOMY    . ABLATION     right leg less than 1 year ago  . CATARACT EXTRACTION W/ INTRAOCULAR LENS  IMPLANT, BILATERAL Bilateral   . CORONARY ANGIOPLASTY     stents  . EYE SURGERY    . FRACTURE SURGERY      right wrist  . TOTAL HIP ARTHROPLASTY Right 02/20/2016   Procedure: TOTAL HIP ARTHROPLASTY ANTERIOR APPROACH;  Surgeon: Hessie Knows, MD;  Location: [facility] ORS;  Service: Orthopedics;  Laterality: Right;     Home Meds: Prior to Admission medications   Medication Sig Start Date End Date Taking? Authorizing Provider  ALPRAZolam (XANAX) 0.25 MG tablet Take 0.25 mg by mouth 2 (two) times daily. As needed usually just at bedtime   Yes [provider]  amLODipine (NORVASC) 10 MG tablet Take 10 mg by mouth daily.   Yes [provider]  cholecalciferol (VITAMIN D) 1000 units tablet Take 1,000 Units by mouth daily.   Yes [provider]  ibuprofen (ADVIL,MOTRIN) 200 MG tablet Take 1 tablet (200 mg total) by mouth every 8 (eight) hours as needed. 07/14/18  Yes Fritzi Mandes, MD  Ipratropium-Albuterol (COMBIVENT RESPIMAT) 20-100 MCG/ACT AERS respimat Inhale 1 puff into the lungs every 6 (six) hours.   Yes [provider]  isosorbide dinitrate (ISORDIL) 30 MG tablet Take 30 mg by mouth 2 (two) times daily.   Yes [provider]  lisinopril (PRINIVIL,ZESTRIL) 10 MG tablet Take 5 mg by mouth daily.   Yes [provider]  metoprolol succinate (TOPROL-XL) 50 MG 24 hr tablet Take 75 mg by mouth daily. Take with or immediately following a meal.   Yes [provider]  nitroGLYCERIN (NITROSTAT) 0.4 MG  SL tablet Place 1 tablet (0.4 mg total) under the tongue every 5 (five) minutes as needed for chest pain. 07/14/18  Yes Fritzi Mandes, MD  omeprazole (PRILOSEC) 20 MG capsule Take 20 mg by mouth daily.   Yes [provider]  simvastatin (ZOCOR) 40 MG tablet Take 40 mg by mouth daily.   Yes [provider]  vitamin B-12 (CYANOCOBALAMIN) 1000 MCG tablet Take 1,000 mcg by mouth daily.   Yes [provider]    Inpatient Medications:  . ALPRAZolam  0.25 mg Oral BID  . amLODipine  10 mg Oral Daily  . cholecalciferol  1,000 Units Oral Daily   . docusate sodium  100 mg Oral BID  . furosemide  20 mg Intravenous Once  . ipratropium-albuterol  3 mL Nebulization Q4H  . isosorbide dinitrate  30 mg Oral BID  . lisinopril  5 mg Oral Daily  . metoprolol succinate  75 mg Oral Daily  . pantoprazole  40 mg Oral Daily  . simvastatin  40 mg Oral Daily  . vitamin B-12  1,000 mcg Oral Daily     Allergies: No Known Allergies  Social History   Socioeconomic History  . Marital status: Widowed    Spouse name: Not on file  . Number of children: Not on file  . Years of education: Not on file  . Highest education level: Not on file  Occupational History  . Not on file  Social Needs  . Financial resource strain: Not on file  . Food insecurity:    Worry: Not on file    Inability: Not on file  . Transportation needs:    Medical: Not on file    Non-medical: Not on file  Tobacco Use  . Smoking status: Former Smoker    Last attempt to quit: 02/11/1997    Years since quitting: 21.4  . Smokeless tobacco: Never Used  Substance and Sexual Activity  . Alcohol use: No  . Drug use: No  . Sexual activity: Not on file  Lifestyle  . Physical activity:    Days per week: Not on file    Minutes per session: Not on file  . Stress: Not on file  Relationships  . Social connections:    Talks on phone: Not on file    Gets together: Not on file    Attends religious service: Not on file    Active member of club or organization: Not on file    Attends meetings of clubs or organizations: Not on file    Relationship status: Not on file  . Intimate partner violence:    Fear of current or ex partner: Not on file    Emotionally abused: Not on file    Physically abused: Not on file    Forced sexual activity: Not on file  Other Topics Concern  . Not on file  Social History Narrative  . Not on file     History reviewed. No pertinent family history.   Review of Systems: A 12-system review of systems was performed and is negative except as noted  in the HPI.  Labs: Recent Labs    07/13/18 1232 07/13/18 1523 07/13/18 2135 07/15/18 0411  TROPONINI <0.03 <0.03 <0.03 0.06*   Lab Results  Component Value Date   WBC 12.3 (H) 07/15/2018   HGB 13.1 07/15/2018   HCT 40.2 07/15/2018   MCV 79.4 (L) 07/15/2018   PLT 104 (L) 07/15/2018    Recent Labs  Lab 07/15/18 0411  NA  136  K 3.3*  CL 103  CO2 20*  BUN 33*  CREATININE 0.84  CALCIUM 8.6*  PROT 6.3*  BILITOT 6.6*  ALKPHOS 330*  ALT 449*  AST 256*  GLUCOSE 130*   No results found for: CHOL, HDL, LDLCALC, TRIG No results found for: DDIMER  Radiology/Studies:  Dg Chest 2 View  Result Date: 07/12/2018 CLINICAL DATA:  Chest pain EXAM: CHEST - 2 VIEW COMPARISON:  06/01/2011, CT 06/02/2011 FINDINGS: Cardiomegaly with vascular congestion. Low lung volumes. No focal airspace disease or significant pleural effusion. Moderate to large hiatal hernia. Aortic atherosclerosis. No pneumothorax. IMPRESSION: Cardiomegaly with vascular congestion. Moderate to large hiatal hernia Electronically Signed   By: Donavan Foil M.D.   On: 07/12/2018 23:08   Ct Abdomen Pelvis W Contrast  Result Date: 07/15/2018 CLINICAL DATA:  Vomiting. EXAM: CT ABDOMEN AND PELVIS WITH CONTRAST TECHNIQUE: Multidetector CT imaging of the abdomen and pelvis was performed using the standard protocol following bolus administration of intravenous contrast. CONTRAST:  172mL ISOVUE-300 IOPAMIDOL (ISOVUE-300) INJECTION 61% COMPARISON:  Ultrasound right upper quadrant 07/15/2018 FINDINGS: Lower chest: Scarring in the lung bases. Large esophageal hiatal hernia. Coronary artery calcifications. Hepatobiliary: There is severe intra and extrahepatic bile duct dilatation with extrahepatic bile ducts measuring about 2.5 cm in diameter. Focal density within the common bile duct likely representing a stone and measuring about 11 mm in diameter. The gallbladder is markedly distended with mild wall thickening and edema. Small stones are  also seen in the gallbladder. No focal liver lesions. Pancreas: Unremarkable. No pancreatic ductal dilatation or surrounding inflammatory changes. Spleen: Normal in size without focal abnormality. Adrenals/Urinary Tract: No adrenal gland nodules. Multiple bilateral renal cysts. Nephrograms are homogeneous and symmetrical. No hydronephrosis or hydroureter. Bladder is unremarkable. Stomach/Bowel: Stomach, small bowel, and colon are not abnormally distended. Scattered diverticula in the sigmoid colon. No evidence of diverticulitis. No wall thickening or inflammatory changes. Appendix is not identified. Vascular/Lymphatic: Tortuous and dilated aorta with calcification. Aortic diameter measures about 2.3 cm maximum. Extensive vascular calcifications throughout the abdomen and pelvis. Reproductive: Status post hysterectomy. No adnexal masses. Other: No free air or free fluid in the abdomen. Abdominal wall musculature appears intact. Musculoskeletal: Postoperative right hip arthroplasty. Degenerative changes in the spine. No destructive bone lesions. IMPRESSION: 1. Large esophageal hiatal hernia. 2. Severe intra and extrahepatic bile duct dilatation with choledocholithiasis. 3. Cholelithiasis with distended gallbladder and mild gallbladder wall thickening and edema, likely cholecystitis. 4. Diverticulosis of the sigmoid colon without evidence of diverticulitis. 5. Tortuous and dilated abdominal aorta with calcification measuring up to 2.3 cm diameter. Electronically Signed   By: Lucienne Capers M.D.   On: 07/15/2018 06:37   Dg Chest Port 1 View  Result Date: 07/15/2018 CLINICAL DATA:  Initial evaluation for acute shortness of breath. EXAM: PORTABLE CHEST 1 VIEW COMPARISON:  Prior radiograph from 07/12/2018 FINDINGS: Moderate cardiomegaly. Mediastinal silhouette normal. Aortic atherosclerosis. Large hiatal hernia again noted. Lungs are hypoinflated. Diffuse vascular and interstitial prominence, suggesting mild  pulmonary interstitial congestion/edema. Suspected small left pleural effusion. Streaky left basilar opacity favored to reflect atelectatic changes. No other focal airspace disease. No pneumothorax. No acute osseous abnormality. IMPRESSION: 1. Cardiomegaly with mild diffuse pulmonary interstitial edema. 2. Probable small left pleural effusion. Associated streaky left basilar opacity favored to reflect atelectasis and/or congestion. 3. Large hiatal hernia. 4. Aortic atherosclerosis. Electronically Signed   By: Jeannine Boga M.D.   On: 07/15/2018 04:46   US Abdomen Limited Ruq  Result Date: 07/15/2018 CLINICAL DATA:  Initial evaluation for acute abdominal pain. Elevated LFTs. EXAM: ULTRASOUND ABDOMEN LIMITED RIGHT UPPER QUADRANT COMPARISON:  None. FINDINGS: Gallbladder: Distended gallbladder containing internal stones and sludge, largest of which measures approximately 2.1 cm. Gallbladder wall thickened up to 5.6 mm. Positive sonographic Murphy sign elicited on exam. Common bile duct: Diameter: Dilated up to 16.2 mm, 21 mm distally. There appears to be a 16 mm stone lodged within the distal common bile duct. Intrahepatic biliary dilatation noted. Liver: No focal lesion identified. Within normal limits in parenchymal echogenicity. Portal vein is patent on color Doppler imaging with normal direction of blood flow towards the liver. Incidental note made of right renal cysts. IMPRESSION: 1. Distended gallbladder with internal stones and sludge, with associated wall thickening and positive sonographic Murphy sign. Clinical correlation for acute cholecystitis recommended. 2. Choledocholithiasis with 16 mm stone positioned within the distal common bile duct. Associated prominent intra and extrahepatic biliary dilatation as above. Electronically Signed   By: Jeannine Boga M.D.   On: 07/15/2018 06:28    Wt Readings from Last 3 Encounters:  07/15/18 74.4 kg  07/14/18 75.1 kg  02/20/16 83 kg    EKG:  Sinus rhythm with no acute ischemia.  Physical Exam:  Blood pressure (!) 154/74, pulse (!) 108, temperature 98.7 F (37.1 C), temperature source Oral, resp. rate (!) 32, height 4\' 11"  (1.499 m), weight 74.4 kg, SpO2 91 %. Body mass index is 33.12 kg/m. General: Well developed, well nourished, in no acute distress. Head: Normocephalic, atraumatic, sclera non-icteric, no xanthomas, nares are without discharge.  Neck: Negative for carotid bruits. JVD not elevated. Lungs: Clear bilaterally to auscultation without wheezes, rales, or rhonchi. Breathing is unlabored. Heart: RRR with S1 S2. No murmurs, rubs, or gallops appreciated. Abdomen: Soft, non-tender, non-distended with normoactive bowel sounds. No hepatomegaly. No rebound/guarding. No obvious abdominal masses. Msk:  Strength and tone appear normal for age. Extremities: No clubbing or cyanosis. No edema.  Distal pedal pulses are 2+ and equal bilaterally. Neuro: Alert and oriented X 3. No facial asymmetry. No focal deficit. Moves all extremities spontaneously. Psych:  Responds to questions appropriately with a normal affect.     Assessment and Plan  84 year old female with history of diastolic heart failure with a known ejection fraction 55 to 65% admitted with nausea and vomiting and noted to have acute cholecystitis with cholelithiasis and bile duct distention.  She will need ERCP urgently.  Troponin is mildly elevated at 0.07.  Appears hemodynamically stable no evidence of acute heart failure.  Would proceed with ERCP or surgical intervention as planned with no further cardiac work-up.  Patient is optimized from a cardiac standpoint.  Signed, Teodoro Spray MD 07/15/2018, 10:46 AM Pager: [phone]

## 2018-07-15 NOTE — Anesthesia Postprocedure Evaluation (Signed)
Anesthesia Post Note  Patient: Ashley [name]  Procedure(s) Performed: ENDOSCOPIC RETROGRADE CHOLANGIOPANCREATOGRAPHY (ERCP) WITH PROPOFOL (N/A )  Patient location during evaluation: Endoscopy Anesthesia Type: General Level of consciousness: awake and alert Pain management: pain level controlled Vital Signs Assessment: post-procedure vital signs reviewed and stable Respiratory status: spontaneous breathing, nonlabored ventilation, respiratory function stable and patient connected to nasal cannula oxygen Cardiovascular status: blood pressure returned to baseline and stable Postop Assessment: no apparent nausea or vomiting Anesthetic complications: no     Last Vitals:  Vitals:   07/15/18 1557 07/15/18 1612  BP: 101/63 109/66  Pulse: 87 94  Resp: (!) 24 12  Temp: 36.6 C   SpO2: 97% 94%    Last Pain:  Vitals:   07/15/18 1612  TempSrc:   PainSc: 0-No pain                 Ammiel Guiney S

## 2018-07-15 NOTE — ED Notes (Signed)
Pt had another episode of small amount of dark emesis. EDP made aware of color or emesis

## 2018-07-16 LAB — HEPATIC FUNCTION PANEL
ALBUMIN: 2.6 g/dL — AB (ref 3.5–5.0)
ALT: 301 U/L — ABNORMAL HIGH (ref 0–44)
AST: 118 U/L — AB (ref 15–41)
Alkaline Phosphatase: 171 U/L — ABNORMAL HIGH (ref 38–126)
Bilirubin, Direct: 2.2 mg/dL — ABNORMAL HIGH (ref 0.0–0.2)
Indirect Bilirubin: 1.2 mg/dL — ABNORMAL HIGH (ref 0.3–0.9)
TOTAL PROTEIN: 5.6 g/dL — AB (ref 6.5–8.1)
Total Bilirubin: 3.4 mg/dL — ABNORMAL HIGH (ref 0.3–1.2)

## 2018-07-16 MED ORDER — POLYVINYL ALCOHOL 1.4 % OP SOLN
1.0000 [drp] | OPHTHALMIC | Status: DC | PRN
  Administered 2018-07-17 – 2018-07-18 (×2): 1 [drp] via OPHTHALMIC
  Filled 2018-07-16: qty 15

## 2018-07-16 MED ORDER — IPRATROPIUM-ALBUTEROL 0.5-2.5 (3) MG/3ML IN SOLN
3.0000 mL | Freq: Four times a day (QID) | RESPIRATORY_TRACT | Status: DC | PRN
  Administered 2018-07-19: 3 mL via RESPIRATORY_TRACT
  Filled 2018-07-16: qty 3

## 2018-07-16 NOTE — Progress Notes (Signed)
Cephas Darby, MD [address]  [place]  [place], [place] [address]  Main: [phone]  Fax: [phone] Pager: [phone]   Subjective: Patient slept well. She denies abdominal pain, nausea or vomiting. Her daughter is bedside. She reports constipation and mild hip pain She underwent ERCP yesterday with removal of CBD stones  Objective: Vital signs in last 24 hours: Vitals:   07/15/18 2017 07/16/18 0211 07/16/18 0437 07/16/18 0833  BP:   112/63 124/65  Pulse:   85 86  Resp:   17 16  Temp:   97.7 F (36.5 C) (!) 97.5 F (36.4 C)  TempSrc:   Oral Oral  SpO2: 94% 96% 97% 95%  Weight:   74.2 kg   Height:       Weight change:   Intake/Output Summary (Last 24 hours) at 07/16/2018 1058 Last data filed at 07/16/2018 1032 Gross per 24 hour  Intake 360 ml  Output 750 ml  Net -390 ml     Exam: Heart:: Regular rate and rhythm or S1S2 present Lungs: normal and clear to auscultation Abdomen: soft, nontender, normal bowel sounds   Lab Results: CBC Latest Ref Rng & Units 07/15/2018 07/15/2018 07/13/2018  WBC 3.6 - 11.0 K/uL - 12.3(H) 17.6(H)  Hemoglobin 12.0 - 16.0 g/dL 11.9(L) 13.1 13.0  Hematocrit 35.0 - 47.0 % 36.8 40.2 40.3  Platelets 150 - 440 K/uL - 104(L) 142(L)   Hepatic Function Latest Ref Rng & Units 07/15/2018  Total Protein 6.5 - 8.1 g/dL 6.3(L)  Albumin 3.5 - 5.0 g/dL 3.4(L)  AST 15 - 41 U/L 256(H)  ALT 0 - 44 U/L 449(H)  Alk Phosphatase 38 - 126 U/L 330(H)  Total Bilirubin 0.3 - 1.2 mg/dL 6.6(H)  Bilirubin, Direct 0.0 - 0.2 mg/dL 4.2(H)    Micro Results: No results found for this or any previous visit (from the past 240 hour(s)). Studies/Results: Ct Abdomen Pelvis W Contrast  Result Date: 07/15/2018 CLINICAL DATA:  Vomiting. EXAM: CT ABDOMEN AND PELVIS WITH CONTRAST TECHNIQUE: Multidetector CT imaging of the abdomen and pelvis was performed using the standard protocol following bolus administration of intravenous contrast. CONTRAST:  156m  ISOVUE-300 IOPAMIDOL (ISOVUE-300) INJECTION 61% COMPARISON:  Ultrasound right upper quadrant 07/15/2018 FINDINGS: Lower chest: Scarring in the lung bases. Large esophageal hiatal hernia. Coronary artery calcifications. Hepatobiliary: There is severe intra and extrahepatic bile duct dilatation with extrahepatic bile ducts measuring about 2.5 cm in diameter. Focal density within the common bile duct likely representing a stone and measuring about 11 mm in diameter. The gallbladder is markedly distended with mild wall thickening and edema. Small stones are also seen in the gallbladder. No focal liver lesions. Pancreas: Unremarkable. No pancreatic ductal dilatation or surrounding inflammatory changes. Spleen: Normal in size without focal abnormality. Adrenals/Urinary Tract: No adrenal gland nodules. Multiple bilateral renal cysts. Nephrograms are homogeneous and symmetrical. No hydronephrosis or hydroureter. Bladder is unremarkable. Stomach/Bowel: Stomach, small bowel, and colon are not abnormally distended. Scattered diverticula in the sigmoid colon. No evidence of diverticulitis. No wall thickening or inflammatory changes. Appendix is not identified. Vascular/Lymphatic: Tortuous and dilated aorta with calcification. Aortic diameter measures about 2.3 cm maximum. Extensive vascular calcifications throughout the abdomen and pelvis. Reproductive: Status post hysterectomy. No adnexal masses. Other: No free air or free fluid in the abdomen. Abdominal wall musculature appears intact. Musculoskeletal: Postoperative right hip arthroplasty. Degenerative changes in the spine. No destructive bone lesions. IMPRESSION: 1. Large esophageal hiatal hernia. 2. Severe intra and extrahepatic bile duct dilatation  with choledocholithiasis. 3. Cholelithiasis with distended gallbladder and mild gallbladder wall thickening and edema, likely cholecystitis. 4. Diverticulosis of the sigmoid colon without evidence of diverticulitis. 5. Tortuous  and dilated abdominal aorta with calcification measuring up to 2.3 cm diameter. Electronically Signed   By: Lucienne Capers M.D.   On: 07/15/2018 06:37   Dg Chest Port 1 View  Result Date: 07/15/2018 CLINICAL DATA:  Initial evaluation for acute shortness of breath. EXAM: PORTABLE CHEST 1 VIEW COMPARISON:  Prior radiograph from 07/12/2018 FINDINGS: Moderate cardiomegaly. Mediastinal silhouette normal. Aortic atherosclerosis. Large hiatal hernia again noted. Lungs are hypoinflated. Diffuse vascular and interstitial prominence, suggesting mild pulmonary interstitial congestion/edema. Suspected small left pleural effusion. Streaky left basilar opacity favored to reflect atelectatic changes. No other focal airspace disease. No pneumothorax. No acute osseous abnormality. IMPRESSION: 1. Cardiomegaly with mild diffuse pulmonary interstitial edema. 2. Probable small left pleural effusion. Associated streaky left basilar opacity favored to reflect atelectasis and/or congestion. 3. Large hiatal hernia. 4. Aortic atherosclerosis. Electronically Signed   By: Jeannine Boga M.D.   On: 07/15/2018 04:46   Dg C-arm 1-60 Min-no Report  Result Date: 07/15/2018 Fluoroscopy was utilized by the requesting physician.  No radiographic interpretation.   US Abdomen Limited Ruq  Result Date: 07/15/2018 CLINICAL DATA:  Initial evaluation for acute abdominal pain. Elevated LFTs. EXAM: ULTRASOUND ABDOMEN LIMITED RIGHT UPPER QUADRANT COMPARISON:  None. FINDINGS: Gallbladder: Distended gallbladder containing internal stones and sludge, largest of which measures approximately 2.1 cm. Gallbladder wall thickened up to 5.6 mm. Positive sonographic Murphy sign elicited on exam. Common bile duct: Diameter: Dilated up to 16.2 mm, 21 mm distally. There appears to be a 16 mm stone lodged within the distal common bile duct. Intrahepatic biliary dilatation noted. Liver: No focal lesion identified. Within normal limits in parenchymal  echogenicity. Portal vein is patent on color Doppler imaging with normal direction of blood flow towards the liver. Incidental note made of right renal cysts. IMPRESSION: 1. Distended gallbladder with internal stones and sludge, with associated wall thickening and positive sonographic Murphy sign. Clinical correlation for acute cholecystitis recommended. 2. Choledocholithiasis with 16 mm stone positioned within the distal common bile duct. Associated prominent intra and extrahepatic biliary dilatation as above. Electronically Signed   By: Jeannine Boga M.D.   On: 07/15/2018 06:28   Medications: I have reviewed the patient's current medications. Scheduled Meds: . ALPRAZolam  0.25 mg Oral BID  . amLODipine  10 mg Oral Daily  . cholecalciferol  1,000 Units Oral Daily  . guaiFENesin  600 mg Oral BID   And  . dextromethorphan  30 mg Oral BID  . docusate sodium  100 mg Oral BID  . indomethacin  100 mg Rectal Once  . isosorbide dinitrate  30 mg Oral BID  . lisinopril  5 mg Oral Daily  . metoprolol succinate  75 mg Oral Daily  . pantoprazole  40 mg Oral Daily  . simvastatin  40 mg Oral Daily  . vitamin B-12  1,000 mcg Oral Daily   Continuous Infusions: PRN Meds:.acetaminophen **OR** acetaminophen, ipratropium-albuterol, nitroGLYCERIN, ondansetron **OR** ondansetron (ZOFRAN) IV   Assessment: Active Problems:   Choledocholithiasis  Status post ERCP on 07/15/2018 with biliary sphincterotomy and removal of bile duct stones No evidence of post ERCP pancreatitis  Plan: Check LFTs today Advance diet as tolerated Plan for cholecystectomy Monday as inpatient  GI will sign off, please call us back with questions    LOS: 1 day   Rohini Vanga 07/16/2018, 10:58 AM

## 2018-07-16 NOTE — Progress Notes (Signed)
[place] [facility] [place] NAME: Ashley [name]    MR#:  [id number]  DATE OF BIRTH:  November 23, 1930  SUBJECTIVE:   Eating CLD. No complaints. No abd pain Doing well. Slept good Daughter in the room REVIEW OF SYSTEMS:   Review of Systems  Constitutional: Negative for chills, fever and weight loss.  HENT: Negative for ear discharge, ear pain and nosebleeds.   Eyes: Negative for blurred vision, pain and discharge.  Respiratory: Negative for sputum production, wheezing and stridor.   Cardiovascular: Negative for chest pain, palpitations, orthopnea and PND.  Gastrointestinal: Positive for abdominal pain and nausea. Negative for diarrhea and vomiting.  Genitourinary: Negative for frequency and urgency.  Musculoskeletal: Negative for back pain and joint pain.  Neurological: Negative for sensory change, speech change, focal weakness and weakness.  Psychiatric/Behavioral: Negative for depression and hallucinations. The patient is not nervous/anxious.    Tolerating Diet:CLD Tolerating PT: ambulatory  DRUG ALLERGIES:  No Known Allergies  VITALS:  Blood pressure 124/65, pulse 86, temperature (!) 97.5 F (36.4 C), temperature source Oral, resp. rate 16, height 4\' 11"  (1.499 m), weight 74.2 kg, SpO2 95 %.  PHYSICAL EXAMINATION:   Physical Exam  GENERAL:  84 y.o.-year-old patient lying in the bed with no acute distress.  EYES: Pupils equal, round, reactive to light and accommodation. No scleral icterus. Extraocular muscles intact.  HEENT: Head atraumatic, normocephalic. Oropharynx and nasopharynx clear.  NECK:  Supple, no jugular venous distention. No thyroid enlargement, no tenderness.  LUNGS: Normal breath sounds bilaterally, no wheezing, no rales, no rhonchi. No use of accessory muscles of respiration.  CARDIOVASCULAR: S1, S2 normal. No murmurs, rubs, or gallops.  ABDOMEN: Soft, mild tenderness +, nondistended. Bowel sounds present. No  organomegaly or mass.  EXTREMITIES: No cyanosis, clubbing or edema b/l.    NEUROLOGIC: Cranial nerves II through XII are intact. No focal Motor or sensory deficits b/l.   PSYCHIATRIC:  patient is alert and oriented x 3.  SKIN: No obvious rash, lesion, or ulcer.   LABORATORY PANEL:  CBC Recent Labs  Lab 07/15/18 0411 07/15/18 1719  WBC 12.3*  --   HGB 13.1 11.9*  HCT 40.2 36.8  PLT 104*  --     Chemistries  Recent Labs  Lab 07/15/18 0411  NA 136  K 3.3*  CL 103  CO2 20*  GLUCOSE 130*  BUN 33*  CREATININE 0.84  CALCIUM 8.6*  AST 256*  ALT 449*  ALKPHOS 330*  BILITOT 6.6*   Cardiac Enzymes Recent Labs  Lab 07/15/18 2230  TROPONINI 0.03*   RADIOLOGY:  Ct Abdomen Pelvis W Contrast  Result Date: 07/15/2018 CLINICAL DATA:  Vomiting. EXAM: CT ABDOMEN AND PELVIS WITH CONTRAST TECHNIQUE: Multidetector CT imaging of the abdomen and pelvis was performed using the standard protocol following bolus administration of intravenous contrast. CONTRAST:  13mL ISOVUE-300 IOPAMIDOL (ISOVUE-300) INJECTION 61% COMPARISON:  Ultrasound right upper quadrant 07/15/2018 FINDINGS: Lower chest: Scarring in the lung bases. Large esophageal hiatal hernia. Coronary artery calcifications. Hepatobiliary: There is severe intra and extrahepatic bile duct dilatation with extrahepatic bile ducts measuring about 2.5 cm in diameter. Focal density within the common bile duct likely representing a stone and measuring about 11 mm in diameter. The gallbladder is markedly distended with mild wall thickening and edema. Small stones are also seen in the gallbladder. No focal liver lesions. Pancreas: Unremarkable. No pancreatic ductal dilatation or surrounding inflammatory changes. Spleen: Normal in size without focal abnormality. Adrenals/Urinary Tract: No  adrenal gland nodules. Multiple bilateral renal cysts. Nephrograms are homogeneous and symmetrical. No hydronephrosis or hydroureter. Bladder is unremarkable.  Stomach/Bowel: Stomach, small bowel, and colon are not abnormally distended. Scattered diverticula in the sigmoid colon. No evidence of diverticulitis. No wall thickening or inflammatory changes. Appendix is not identified. Vascular/Lymphatic: Tortuous and dilated aorta with calcification. Aortic diameter measures about 2.3 cm maximum. Extensive vascular calcifications throughout the abdomen and pelvis. Reproductive: Status post hysterectomy. No adnexal masses. Other: No free air or free fluid in the abdomen. Abdominal wall musculature appears intact. Musculoskeletal: Postoperative right hip arthroplasty. Degenerative changes in the spine. No destructive bone lesions. IMPRESSION: 1. Large esophageal hiatal hernia. 2. Severe intra and extrahepatic bile duct dilatation with choledocholithiasis. 3. Cholelithiasis with distended gallbladder and mild gallbladder wall thickening and edema, likely cholecystitis. 4. Diverticulosis of the sigmoid colon without evidence of diverticulitis. 5. Tortuous and dilated abdominal aorta with calcification measuring up to 2.3 cm diameter. Electronically Signed   By: Lucienne Capers M.D.   On: 07/15/2018 06:37   Dg Chest Port 1 View  Result Date: 07/15/2018 CLINICAL DATA:  Initial evaluation for acute shortness of breath. EXAM: PORTABLE CHEST 1 VIEW COMPARISON:  Prior radiograph from 07/12/2018 FINDINGS: Moderate cardiomegaly. Mediastinal silhouette normal. Aortic atherosclerosis. Large hiatal hernia again noted. Lungs are hypoinflated. Diffuse vascular and interstitial prominence, suggesting mild pulmonary interstitial congestion/edema. Suspected small left pleural effusion. Streaky left basilar opacity favored to reflect atelectatic changes. No other focal airspace disease. No pneumothorax. No acute osseous abnormality. IMPRESSION: 1. Cardiomegaly with mild diffuse pulmonary interstitial edema. 2. Probable small left pleural effusion. Associated streaky left basilar opacity  favored to reflect atelectasis and/or congestion. 3. Large hiatal hernia. 4. Aortic atherosclerosis. Electronically Signed   By: Jeannine Boga M.D.   On: 07/15/2018 04:46   Dg C-arm 1-60 Min-no Report  Result Date: 07/15/2018 Fluoroscopy was utilized by the requesting physician.  No radiographic interpretation.   US Abdomen Limited Ruq  Result Date: 07/15/2018 CLINICAL DATA:  Initial evaluation for acute abdominal pain. Elevated LFTs. EXAM: ULTRASOUND ABDOMEN LIMITED RIGHT UPPER QUADRANT COMPARISON:  None. FINDINGS: Gallbladder: Distended gallbladder containing internal stones and sludge, largest of which measures approximately 2.1 cm. Gallbladder wall thickened up to 5.6 mm. Positive sonographic Murphy sign elicited on exam. Common bile duct: Diameter: Dilated up to 16.2 mm, 21 mm distally. There appears to be a 16 mm stone lodged within the distal common bile duct. Intrahepatic biliary dilatation noted. Liver: No focal lesion identified. Within normal limits in parenchymal echogenicity. Portal vein is patent on color Doppler imaging with normal direction of blood flow towards the liver. Incidental note made of right renal cysts. IMPRESSION: 1. Distended gallbladder with internal stones and sludge, with associated wall thickening and positive sonographic Murphy sign. Clinical correlation for acute cholecystitis recommended. 2. Choledocholithiasis with 16 mm stone positioned within the distal common bile duct. Associated prominent intra and extrahepatic biliary dilatation as above. Electronically Signed   By: Jeannine Boga M.D.   On: 07/15/2018 06:28   ASSESSMENT AND PLAN:   NevaGuthrieis a9 y.o.femalewith a known history of coronary artery disease status post MI in the past, COPD, PAD, hypertension, anemia and other comorbidities. Patient presented to emergency room for acute onset of nausea vomiting and right upper quadrant abdominal pain.   1. acute  Choledocholithiasis/cholelithiasis: -patient came in with abdominal pain and several episodes of vomiting after she was discharged at home. She is currently not complaining of any abdominal pain or nausea. She has no  respiratory distress or any chest pain. - Consult gastroenterology s/p ERCP--- CBD stones x2 removed -Seen by surgery--Lap chole planned for monday  2.  Hypertension: Controlled; continue Isordil, lisinopril and amlodipine  3.  Coronary artery disease: Stable.   -Elevation in troponin likely secondary to demand ischemia. Pt clinically does not appear to be  in heart failure. - Continue to follow cardiac biomarkers.   -will give 1 dose lasix empirically -ECH0 done 2 days ago negative for WMA and was seen by cardiology -spoke with DR fath--will see pt  4.  CHF: Chronic; diastolic.  Continue metoprolol clinically does not appear to be in HF  5.  DVT prophylaxis: SCDs  Ambulate in hallways   Case discussed with Care Management/Social Worker. Management plans discussed with the patient, family and they are in agreement.  CODE STATUS: DNR  DVT Prophylaxis: SCD  TOTAL TIME TAKING CARE OF THIS PATIENT: 30 minutes.  >50% time spent on counselling and coordination of care  POSSIBLE D/C IN 2-3 DAYS, DEPENDING ON CLINICAL CONDITION.  Note: This dictation was prepared with Dragon dictation along with smaller phrase technology. Any transcriptional errors that result from this process are unintentional.  Fritzi Mandes M.D on 07/16/2018 at 8:48 AM  Between 7am to 6pm - Pager - [phone]  After 6pm go to [email or web address] - password [email or web address] [facility]  Office  [phone]  CC: Primary care physician; Albina Billet, MDPatient ID: Ashley [name], female   DOB: August 11, 1931, 84 y.o.   MRN: [id number]

## 2018-07-16 NOTE — Plan of Care (Signed)
?  Problem: Clinical Measurements: ?Goal: Respiratory complications will improve ?Outcome: Progressing ?Goal: Cardiovascular complication will be avoided ?Outcome: Progressing ?  ?Problem: Activity: ?Goal: Risk for activity intolerance will decrease ?Outcome: Progressing ?  ?Problem: Pain Managment: ?Goal: General experience of comfort will improve ?Outcome: Progressing ?  ?Problem: Safety: ?Goal: Ability to remain free from injury will improve ?Outcome: Progressing ?  ?

## 2018-07-17 ENCOUNTER — Encounter: Payer: Self-pay | Admitting: Gastroenterology

## 2018-07-17 ENCOUNTER — Other Ambulatory Visit: Payer: Self-pay

## 2018-07-17 MED ORDER — LACTATED RINGERS IV SOLN
INTRAVENOUS | Status: DC
  Administered 2018-07-18 – 2018-07-19 (×2): via INTRAVENOUS

## 2018-07-17 MED ORDER — INFLUENZA VAC SPLIT HIGH-DOSE 0.5 ML IM SUSY
0.5000 mL | PREFILLED_SYRINGE | INTRAMUSCULAR | Status: AC
  Administered 2018-07-19: 0.5 mL via INTRAMUSCULAR
  Filled 2018-07-17: qty 0.5

## 2018-07-17 NOTE — Progress Notes (Signed)
07/17/2018  Subjective: No acute events.  Patient currently is asymptomatic and has not had further abdominal pain.  She had ERCP on 9/20 without any complications.  Scheduled for surgery tomorrow with Dr. Dahlia Byes.  Vital signs: Temp:  [97.5 F (36.4 C)-97.8 F (36.6 C)] 97.6 F (36.4 C) (09/22 0515) Pulse Rate:  [71-91] 71 (09/22 0812) Resp:  [16-20] 18 (09/22 0812) BP: (102-136)/(57-71) 136/71 (09/22 0812) SpO2:  [94 %-96 %] 96 % (09/22 0812) Weight:  [74.4 kg] 74.4 kg (09/22 0500)   Intake/Output: 09/21 0701 - 09/22 0700 In: -  Out: 450 [Urine:450] Last BM Date: 07/15/18  Physical Exam: Constitutional: No acute distress Abdomen: soft, non-distended, nontender to palpation.  Labs:  Recent Labs    07/15/18 0411 07/15/18 1719  WBC 12.3*  --   HGB 13.1 11.9*  HCT 40.2 36.8  PLT 104*  --    Recent Labs    07/15/18 0411  NA 136  K 3.3*  CL 103  CO2 20*  GLUCOSE 130*  BUN 33*  CREATININE 0.84  CALCIUM 8.6*   No results for input(s): LABPROT, INR in the last 72 hours.  Imaging: No results found.  Assessment/Plan: This is a 84 y.o. female with choledocholithiasis.  --Plan for laparoscopic cholecystectomy with Dr. Dahlia Byes tomorrow. --NPO after midnight with gentle IV fluid hydration.  Will check LFTs in AM. --Per cardiology, stable and optimized for surgery.   Melvyn Neth, [facility]

## 2018-07-17 NOTE — Progress Notes (Signed)
Patient Name: Ashley [name] Date of Encounter: 07/17/2018  Hospital Problem List     Active Problems:   Choledocholithiasis    Patient Profile     84 year old female with history of coronary disease, admitted with acute cholelithiasis.  Stable from a cardiac standpoint.  Patient is at moderate but optimize risk for procedure from a cardiac standpoint.  Subjective   No chest pain.  Doing well from a cardiac standpoint  Inpatient Medications    . ALPRAZolam  0.25 mg Oral BID  . amLODipine  10 mg Oral Daily  . cholecalciferol  1,000 Units Oral Daily  . guaiFENesin  600 mg Oral BID   And  . dextromethorphan  30 mg Oral BID  . docusate sodium  100 mg Oral BID  . [START ON 07/18/2018] Influenza vac split quadrivalent PF  0.5 mL Intramuscular Tomorrow-1000  . isosorbide dinitrate  30 mg Oral BID  . lisinopril  5 mg Oral Daily  . metoprolol succinate  75 mg Oral Daily  . pantoprazole  40 mg Oral Daily  . simvastatin  40 mg Oral Daily  . vitamin B-12  1,000 mcg Oral Daily    Vital Signs    Vitals:   07/16/18 1947 07/17/18 0500 07/17/18 0515 07/17/18 0812  BP: (!) 111/57  127/69 136/71  Pulse: 81  91 71  Resp: 16   18  Temp: (!) 97.5 F (36.4 C)  97.6 F (36.4 C)   TempSrc: Oral  Oral   SpO2: 94%  96% 96%  Weight:  74.4 kg    Height:       No intake or output data in the 24 hours ending 07/17/18 1038 Filed Weights   07/15/18 0830 07/16/18 0437 07/17/18 0500  Weight: 74.4 kg 74.2 kg 74.4 kg    Physical Exam    GEN: Well nourished, well developed, in no acute distress.  HEENT: normal.  Neck: Supple, no JVD, carotid bruits, or masses. Cardiac: RRR, no murmurs, rubs, or gallops. No clubbing, cyanosis, edema.  Radials/DP/PT 2+ and equal bilaterally.  Respiratory:  Respirations regular and unlabored, clear to auscultation bilaterally. GI: Soft, nontender, nondistended, BS + x 4. MS: no deformity or atrophy. Skin: warm and dry, no rash. Neuro:  Strength and  sensation are intact. Psych: Normal affect.  Labs    CBC Recent Labs    07/15/18 0411 07/15/18 1719  WBC 12.3*  --   NEUTROABS 11.5*  --   HGB 13.1 11.9*  HCT 40.2 36.8  MCV 79.4*  --   PLT 104*  --    Basic Metabolic Panel Recent Labs    07/15/18 0411  NA 136  K 3.3*  CL 103  CO2 20*  GLUCOSE 130*  BUN 33*  CREATININE 0.84  CALCIUM 8.6*   Liver Function Tests Recent Labs    07/15/18 0411 07/15/18 2230  AST 256* 118*  ALT 449* 301*  ALKPHOS 330* 171*  BILITOT 6.6* 3.4*  PROT 6.3* 5.6*  ALBUMIN 3.4* 2.6*   Recent Labs    07/15/18 0411  LIPASE 43   Cardiac Enzymes Recent Labs    07/15/18 1046 07/15/18 1711 07/15/18 2230  TROPONINI 0.03* <0.03 0.03*   BNP Recent Labs    07/15/18 0422  BNP 478.0*   D-Dimer No results for input(s): DDIMER in the last 72 hours. Hemoglobin A1C No results for input(s): HGBA1C in the last 72 hours. Fasting Lipid Panel No results for input(s): CHOL, HDL, LDLCALC, TRIG, CHOLHDL, LDLDIRECT in  the last 72 hours. Thyroid Function Tests Recent Labs    07/15/18 1046  TSH 1.194    Telemetry    Sinus rhythm  ECG    Normal sinus rhythm with no ischemia  Radiology    Dg Chest 2 View  Result Date: 07/12/2018 CLINICAL DATA:  Chest pain EXAM: CHEST - 2 VIEW COMPARISON:  06/01/2011, CT 06/02/2011 FINDINGS: Cardiomegaly with vascular congestion. Low lung volumes. No focal airspace disease or significant pleural effusion. Moderate to large hiatal hernia. Aortic atherosclerosis. No pneumothorax. IMPRESSION: Cardiomegaly with vascular congestion. Moderate to large hiatal hernia Electronically Signed   By: Donavan Foil M.D.   On: 07/12/2018 23:08   Ct Abdomen Pelvis W Contrast  Result Date: 07/15/2018 CLINICAL DATA:  Vomiting. EXAM: CT ABDOMEN AND PELVIS WITH CONTRAST TECHNIQUE: Multidetector CT imaging of the abdomen and pelvis was performed using the standard protocol following bolus administration of intravenous  contrast. CONTRAST:  118mL ISOVUE-300 IOPAMIDOL (ISOVUE-300) INJECTION 61% COMPARISON:  Ultrasound right upper quadrant 07/15/2018 FINDINGS: Lower chest: Scarring in the lung bases. Large esophageal hiatal hernia. Coronary artery calcifications. Hepatobiliary: There is severe intra and extrahepatic bile duct dilatation with extrahepatic bile ducts measuring about 2.5 cm in diameter. Focal density within the common bile duct likely representing a stone and measuring about 11 mm in diameter. The gallbladder is markedly distended with mild wall thickening and edema. Small stones are also seen in the gallbladder. No focal liver lesions. Pancreas: Unremarkable. No pancreatic ductal dilatation or surrounding inflammatory changes. Spleen: Normal in size without focal abnormality. Adrenals/Urinary Tract: No adrenal gland nodules. Multiple bilateral renal cysts. Nephrograms are homogeneous and symmetrical. No hydronephrosis or hydroureter. Bladder is unremarkable. Stomach/Bowel: Stomach, small bowel, and colon are not abnormally distended. Scattered diverticula in the sigmoid colon. No evidence of diverticulitis. No wall thickening or inflammatory changes. Appendix is not identified. Vascular/Lymphatic: Tortuous and dilated aorta with calcification. Aortic diameter measures about 2.3 cm maximum. Extensive vascular calcifications throughout the abdomen and pelvis. Reproductive: Status post hysterectomy. No adnexal masses. Other: No free air or free fluid in the abdomen. Abdominal wall musculature appears intact. Musculoskeletal: Postoperative right hip arthroplasty. Degenerative changes in the spine. No destructive bone lesions. IMPRESSION: 1. Large esophageal hiatal hernia. 2. Severe intra and extrahepatic bile duct dilatation with choledocholithiasis. 3. Cholelithiasis with distended gallbladder and mild gallbladder wall thickening and edema, likely cholecystitis. 4. Diverticulosis of the sigmoid colon without evidence of  diverticulitis. 5. Tortuous and dilated abdominal aorta with calcification measuring up to 2.3 cm diameter. Electronically Signed   By: Lucienne Capers M.D.   On: 07/15/2018 06:37   Dg Chest Port 1 View  Result Date: 07/15/2018 CLINICAL DATA:  Initial evaluation for acute shortness of breath. EXAM: PORTABLE CHEST 1 VIEW COMPARISON:  Prior radiograph from 07/12/2018 FINDINGS: Moderate cardiomegaly. Mediastinal silhouette normal. Aortic atherosclerosis. Large hiatal hernia again noted. Lungs are hypoinflated. Diffuse vascular and interstitial prominence, suggesting mild pulmonary interstitial congestion/edema. Suspected small left pleural effusion. Streaky left basilar opacity favored to reflect atelectatic changes. No other focal airspace disease. No pneumothorax. No acute osseous abnormality. IMPRESSION: 1. Cardiomegaly with mild diffuse pulmonary interstitial edema. 2. Probable small left pleural effusion. Associated streaky left basilar opacity favored to reflect atelectasis and/or congestion. 3. Large hiatal hernia. 4. Aortic atherosclerosis. Electronically Signed   By: Jeannine Boga M.D.   On: 07/15/2018 04:46   Dg C-arm 1-60 Min-no Report  Result Date: 07/15/2018 Fluoroscopy was utilized by the requesting physician.  No radiographic interpretation.   US  Abdomen Limited Ruq  Result Date: 07/15/2018 CLINICAL DATA:  Initial evaluation for acute abdominal pain. Elevated LFTs. EXAM: ULTRASOUND ABDOMEN LIMITED RIGHT UPPER QUADRANT COMPARISON:  None. FINDINGS: Gallbladder: Distended gallbladder containing internal stones and sludge, largest of which measures approximately 2.1 cm. Gallbladder wall thickened up to 5.6 mm. Positive sonographic Murphy sign elicited on exam. Common bile duct: Diameter: Dilated up to 16.2 mm, 21 mm distally. There appears to be a 16 mm stone lodged within the distal common bile duct. Intrahepatic biliary dilatation noted. Liver: No focal lesion identified. Within normal  limits in parenchymal echogenicity. Portal vein is patent on color Doppler imaging with normal direction of blood flow towards the liver. Incidental note made of right renal cysts. IMPRESSION: 1. Distended gallbladder with internal stones and sludge, with associated wall thickening and positive sonographic Murphy sign. Clinical correlation for acute cholecystitis recommended. 2. Choledocholithiasis with 16 mm stone positioned within the distal common bile duct. Associated prominent intra and extrahepatic biliary dilatation as above. Electronically Signed   By: Jeannine Boga M.D.   On: 07/15/2018 06:28    Assessment & Plan    Patient with history of diastolic chronic heart failure currently stable.  Admitted with acute cholelithiasis and cholecystitis.  Status post ERCP and will need lap chole tomorrow.  Is optimized from a cardiac standpoint for this procedure. Javier Docker Benjamine Strout MD 07/17/2018, 10:38 AM  Pager: [phone]

## 2018-07-17 NOTE — Progress Notes (Signed)
[place] at [place] NAME: Ashley [name]    MR#:  [id number]  DATE OF BIRTH:  07-11-1931  SUBJECTIVE:   Eating soft diet. No complaints. No abd pain Doing well. Slept good Son in the room REVIEW OF SYSTEMS:   Review of Systems  Constitutional: Negative for chills, fever and weight loss.  HENT: Negative for ear discharge, ear pain and nosebleeds.   Eyes: Negative for blurred vision, pain and discharge.  Respiratory: Negative for sputum production, wheezing and stridor.   Cardiovascular: Negative for chest pain, palpitations, orthopnea and PND.  Gastrointestinal: Positive for abdominal pain and nausea. Negative for diarrhea and vomiting.  Genitourinary: Negative for frequency and urgency.  Musculoskeletal: Negative for back pain and joint pain.  Neurological: Negative for sensory change, speech change, focal weakness and weakness.  Psychiatric/Behavioral: Negative for depression and hallucinations. The patient is not nervous/anxious.    Tolerating Diet: soft diet Tolerating PT: ambulatory  DRUG ALLERGIES:  No Known Allergies  VITALS:  Blood pressure 136/71, pulse 71, temperature 97.6 F (36.4 C), temperature source Oral, resp. rate 18, height 4\' 11"  (1.499 m), weight 74.4 kg, SpO2 96 %.  PHYSICAL EXAMINATION:   Physical Exam  GENERAL:  84 y.o.-year-old patient lying in the bed with no acute distress.  EYES: Pupils equal, round, reactive to light and accommodation. No scleral icterus. Extraocular muscles intact.  HEENT: Head atraumatic, normocephalic. Oropharynx and nasopharynx clear.  NECK:  Supple, no jugular venous distention. No thyroid enlargement, no tenderness.  LUNGS: Normal breath sounds bilaterally, no wheezing, no rales, no rhonchi. No use of accessory muscles of respiration.  CARDIOVASCULAR: S1, S2 normal. No murmurs, rubs, or gallops.  ABDOMEN: Soft, mild tenderness +, nondistended. Bowel sounds present. No  organomegaly or mass.  EXTREMITIES: No cyanosis, clubbing or edema b/l.    NEUROLOGIC: Cranial nerves II through XII are intact. No focal Motor or sensory deficits b/l.   PSYCHIATRIC:  patient is alert and oriented x 3.  SKIN: No obvious rash, lesion, or ulcer.   LABORATORY PANEL:  CBC Recent Labs  Lab 07/15/18 0411 07/15/18 1719  WBC 12.3*  --   HGB 13.1 11.9*  HCT 40.2 36.8  PLT 104*  --     Chemistries  Recent Labs  Lab 07/15/18 0411 07/15/18 2230  NA 136  --   K 3.3*  --   CL 103  --   CO2 20*  --   GLUCOSE 130*  --   BUN 33*  --   CREATININE 0.84  --   CALCIUM 8.6*  --   AST 256* 118*  ALT 449* 301*  ALKPHOS 330* 171*  BILITOT 6.6* 3.4*   Cardiac Enzymes Recent Labs  Lab 07/15/18 2230  TROPONINI 0.03*   RADIOLOGY:  Dg C-arm 1-60 Min-no Report  Result Date: 07/15/2018 Fluoroscopy was utilized by the requesting physician.  No radiographic interpretation.   ASSESSMENT AND PLAN:   NevaGuthrieis a84 y.o.femalewith a known history of coronary artery disease status post MI in the past, COPD, PAD, hypertension, anemia and other comorbidities. Patient presented to emergency room for acute onset of nausea vomiting and right upper quadrant abdominal pain.   1. acute Choledocholithiasis/cholelithiasis: -patient came in with abdominal pain and several episodes of vomiting after she was discharged at home. She is currently not complaining of any abdominal pain or nausea. She has no respiratory distress or any chest pain. - Consult gastroenterology s/p ERCP--- CBD stones x2 removed -Seen by  surgery--Lap chole planned for Monday at noon by Dr Dahlia Byes  2.  Hypertension: Controlled; continue Isordil, lisinopril and amlodipine  3.  Coronary artery disease: Stable.   -Elevation in troponin likely secondary to demand ischemia. Pt clinically does not appear to be  in heart failure. -ECH0 done 2 days ago negative for WMA and was seen by cardiology -seen by  DR fath  she is optimized from cardiac standpoint  4.  CHF: Chronic; diastolic.  Continue metoprolol clinically does not appear to be in HF  5.  DVT prophylaxis: SCDs  Ambulate in hallways. D/w son   Case discussed with Care Management/Social Worker. Management plans discussed with the patient, family and they are in agreement.  CODE STATUS: DNR  DVT Prophylaxis: SCD  TOTAL TIME TAKING CARE OF THIS PATIENT: 30 minutes.  >50% time spent on counselling and coordination of care  POSSIBLE D/C IN 2-3 DAYS, DEPENDING ON CLINICAL CONDITION.  Note: This dictation was prepared with Dragon dictation along with smaller phrase technology. Any transcriptional errors that result from this process are unintentional.  Ashley [name] M.D on 07/17/2018 at 8:30 AM  Between 7am to 6pm - Pager - [phone]  After 6pm go to [email or web address] - password [email or web address] [facility]  Office  [phone]  CC: Primary care physician; Albina Billet, MDPatient ID: Ashley [name], female   DOB: 03-31-1931, 84 y.o.   MRN: [id number]

## 2018-07-18 ENCOUNTER — Encounter
Admission: EM | Disposition: A | Discharge status: Home Health | Payer: Self-pay | Source: Home / Self Care | Attending: Internal Medicine

## 2018-07-18 ENCOUNTER — Encounter: Payer: Self-pay | Admitting: Anesthesiology

## 2018-07-18 ENCOUNTER — Inpatient Hospital Stay: Payer: Medicare Other | Admitting: Anesthesiology

## 2018-07-18 DIAGNOSIS — K819 Cholecystitis, unspecified: Secondary | ICD-10-CM

## 2018-07-18 HISTORY — PX: CHOLECYSTECTOMY: SHX55

## 2018-07-18 LAB — HEPATIC FUNCTION PANEL
ALBUMIN: 2.4 g/dL — AB (ref 3.5–5.0)
ALT: 123 U/L — ABNORMAL HIGH (ref 0–44)
AST: 20 U/L (ref 15–41)
Alkaline Phosphatase: 128 U/L — ABNORMAL HIGH (ref 38–126)
BILIRUBIN INDIRECT: 0.6 mg/dL (ref 0.3–0.9)
Bilirubin, Direct: 0.4 mg/dL — ABNORMAL HIGH (ref 0.0–0.2)
Total Bilirubin: 1 mg/dL (ref 0.3–1.2)
Total Protein: 5.2 g/dL — ABNORMAL LOW (ref 6.5–8.1)

## 2018-07-18 SURGERY — LAPAROSCOPIC CHOLECYSTECTOMY
Anesthesia: General

## 2018-07-18 MED ORDER — HYDROCODONE-ACETAMINOPHEN 5-325 MG PO TABS
1.0000 | ORAL_TABLET | ORAL | Status: DC | PRN
  Administered 2018-07-18: 1 via ORAL
  Filled 2018-07-18: qty 1

## 2018-07-18 MED ORDER — OXYCODONE HCL 5 MG/5ML PO SOLN: 5.0000 mg | Freq: Once | ORAL | Status: DC | PRN

## 2018-07-18 MED ORDER — PHENYLEPHRINE HCL 10 MG/ML IJ SOLN
INTRAMUSCULAR | Status: DC | PRN
  Administered 2018-07-18 (×4): 100 ug via INTRAVENOUS

## 2018-07-18 MED ORDER — SUCCINYLCHOLINE CHLORIDE 20 MG/ML IJ SOLN
INTRAMUSCULAR | Status: DC | PRN
  Administered 2018-07-18: 100 mg via INTRAVENOUS

## 2018-07-18 MED ORDER — CEFAZOLIN SODIUM-DEXTROSE 2-4 GM/100ML-% IV SOLN
2.0000 g | Freq: Once | INTRAVENOUS | Status: AC
  Administered 2018-07-18: 2 g via INTRAVENOUS

## 2018-07-18 MED ORDER — ONDANSETRON HCL 4 MG/2ML IJ SOLN
INTRAMUSCULAR | Status: DC | PRN
  Administered 2018-07-18: 4 mg via INTRAVENOUS

## 2018-07-18 MED ORDER — PROPOFOL 10 MG/ML IV BOLUS
INTRAVENOUS | Status: DC | PRN
  Administered 2018-07-18: 120 mg via INTRAVENOUS

## 2018-07-18 MED ORDER — FENTANYL CITRATE (PF) 100 MCG/2ML IJ SOLN
INTRAMUSCULAR | Status: AC
  Administered 2018-07-18: 25 ug via INTRAVENOUS
  Filled 2018-07-18: qty 2

## 2018-07-18 MED ORDER — FENTANYL CITRATE (PF) 100 MCG/2ML IJ SOLN
25.0000 ug | INTRAMUSCULAR | Status: DC | PRN
  Administered 2018-07-18: 50 ug via INTRAVENOUS
  Administered 2018-07-18 (×2): 25 ug via INTRAVENOUS

## 2018-07-18 MED ORDER — MORPHINE SULFATE (PF) 2 MG/ML IV SOLN: 2.0000 mg | INTRAVENOUS | Status: DC | PRN

## 2018-07-18 MED ORDER — OXYCODONE HCL 5 MG PO TABS: 5.0000 mg | ORAL_TABLET | Freq: Once | ORAL | Status: DC | PRN

## 2018-07-18 MED ORDER — BUPIVACAINE-EPINEPHRINE 0.25% -1:200000 IJ SOLN
INTRAMUSCULAR | Status: DC | PRN
  Administered 2018-07-18: 30 mL

## 2018-07-18 MED ORDER — FENTANYL CITRATE (PF) 100 MCG/2ML IJ SOLN
INTRAMUSCULAR | Status: AC
  Filled 2018-07-18: qty 2

## 2018-07-18 MED ORDER — FENTANYL CITRATE (PF) 100 MCG/2ML IJ SOLN
INTRAMUSCULAR | Status: DC | PRN
  Administered 2018-07-18 (×2): 50 ug via INTRAVENOUS

## 2018-07-18 MED ORDER — HYDROMORPHONE HCL 1 MG/ML IJ SOLN
INTRAMUSCULAR | Status: AC
  Filled 2018-07-18: qty 1

## 2018-07-18 MED ORDER — ROCURONIUM BROMIDE 100 MG/10ML IV SOLN
INTRAVENOUS | Status: DC | PRN
  Administered 2018-07-18: 35 mg via INTRAVENOUS

## 2018-07-18 MED ORDER — LIDOCAINE HCL (CARDIAC) PF 100 MG/5ML IV SOSY
PREFILLED_SYRINGE | INTRAVENOUS | Status: DC | PRN
  Administered 2018-07-18: 100 mg via INTRAVENOUS

## 2018-07-18 MED ORDER — CEFAZOLIN SODIUM-DEXTROSE 2-4 GM/100ML-% IV SOLN
INTRAVENOUS | Status: AC
  Filled 2018-07-18: qty 100

## 2018-07-18 MED ORDER — LACTATED RINGERS IV SOLN
INTRAVENOUS | Status: DC | PRN
  Administered 2018-07-18: 12:00:00 via INTRAVENOUS

## 2018-07-18 MED ORDER — PROPOFOL 10 MG/ML IV BOLUS
INTRAVENOUS | Status: AC
  Filled 2018-07-18: qty 20

## 2018-07-18 MED ORDER — DEXAMETHASONE SODIUM PHOSPHATE 10 MG/ML IJ SOLN
INTRAMUSCULAR | Status: DC | PRN
  Administered 2018-07-18: 10 mg via INTRAVENOUS

## 2018-07-18 MED ORDER — SUGAMMADEX SODIUM 200 MG/2ML IV SOLN
INTRAVENOUS | Status: DC | PRN
  Administered 2018-07-18: 200 mg via INTRAVENOUS

## 2018-07-18 SURGICAL SUPPLY — 46 items
APPLICATOR COTTON TIP 6 STRL (MISCELLANEOUS) IMPLANT
APPLICATOR COTTON TIP 6IN STRL (MISCELLANEOUS)
APPLIER CLIP 5 13 M/L LIGAMAX5 (MISCELLANEOUS) ×3
BLADE SURG 15 STRL LF DISP TIS (BLADE) ×1 IMPLANT
BLADE SURG 15 STRL SS (BLADE) ×2
CANISTER SUCT 1200ML W/VALVE (MISCELLANEOUS) ×3 IMPLANT
CHLORAPREP W/TINT 26ML (MISCELLANEOUS) ×3 IMPLANT
CHOLANGIOGRAM CATH TAUT (CATHETERS) IMPLANT
CLEANER CAUTERY TIP 5X5 PAD (MISCELLANEOUS) IMPLANT
CLIP APPLIE 5 13 M/L LIGAMAX5 (MISCELLANEOUS) ×1 IMPLANT
DECANTER SPIKE VIAL GLASS SM (MISCELLANEOUS) IMPLANT
DERMABOND ADVANCED (GAUZE/BANDAGES/DRESSINGS) ×2
DERMABOND ADVANCED .7 DNX12 (GAUZE/BANDAGES/DRESSINGS) ×1 IMPLANT
DRAPE C-ARM XRAY 36X54 (DRAPES) IMPLANT
ELECT CAUTERY BLADE 6.4 (BLADE) ×3 IMPLANT
ELECT REM PT RETURN 9FT ADLT (ELECTROSURGICAL) ×3
ELECTRODE REM PT RTRN 9FT ADLT (ELECTROSURGICAL) ×1 IMPLANT
GLOVE BIO SURGEON STRL SZ7 (GLOVE) ×18 IMPLANT
GOWN STRL REUS W/ TWL LRG LVL3 (GOWN DISPOSABLE) ×3 IMPLANT
GOWN STRL REUS W/TWL LRG LVL3 (GOWN DISPOSABLE) ×6
IRRIGATION STRYKERFLOW (MISCELLANEOUS) ×1 IMPLANT
IRRIGATOR STRYKERFLOW (MISCELLANEOUS) ×3
IV CATH ANGIO 12GX3 LT BLUE (NEEDLE) IMPLANT
IV NS 1000ML (IV SOLUTION) ×2
IV NS 1000ML BAXH (IV SOLUTION) ×1 IMPLANT
L-HOOK LAP DISP 36CM (ELECTROSURGICAL) ×3
LHOOK LAP DISP 36CM (ELECTROSURGICAL) ×1 IMPLANT
NEEDLE HYPO 22GX1.5 SAFETY (NEEDLE) ×3 IMPLANT
NS IRRIG 1000ML POUR BTL (IV SOLUTION) ×3 IMPLANT
PACK LAP CHOLECYSTECTOMY (MISCELLANEOUS) ×3 IMPLANT
PAD CLEANER CAUTERY TIP 5X5 (MISCELLANEOUS)
PENCIL ELECTRO HAND CTR (MISCELLANEOUS) ×3 IMPLANT
POUCH SPECIMEN RETRIEVAL 10MM (ENDOMECHANICALS) ×3 IMPLANT
SCISSORS METZENBAUM CVD 33 (INSTRUMENTS) ×3 IMPLANT
SLEEVE ENDOPATH XCEL 5M (ENDOMECHANICALS) ×6 IMPLANT
SOL ANTI-FOG 6CC FOG-OUT (MISCELLANEOUS) ×1 IMPLANT
SOL FOG-OUT ANTI-FOG 6CC (MISCELLANEOUS) ×2
SPONGE LAP 18X18 RF (DISPOSABLE) ×3 IMPLANT
STOPCOCK 4 WAY LG BORE MALE ST (IV SETS) IMPLANT
SUT ETHIBOND 0 MO6 C/R (SUTURE) IMPLANT
SUT MNCRL AB 4-0 PS2 18 (SUTURE) ×6 IMPLANT
SUT VICRYL 0 AB UR-6 (SUTURE) ×6 IMPLANT
SYR 20CC LL (SYRINGE) IMPLANT
TROCAR XCEL BLUNT TIP 100MML (ENDOMECHANICALS) ×3 IMPLANT
TROCAR XCEL NON-BLD 5MMX100MML (ENDOMECHANICALS) ×3 IMPLANT
TUBING INSUFFLATION (TUBING) ×3 IMPLANT

## 2018-07-18 NOTE — Transfer of Care (Signed)
Immediate Anesthesia Transfer of Care Note  Patient: Ashley [name]  Procedure(s) Performed: LAPAROSCOPIC CHOLECYSTECTOMY (N/A )  Patient Location: PACU  Anesthesia Type:General  Level of Consciousness: sedated  Airway & Oxygen Therapy: Patient Spontanous Breathing and Patient connected to face mask oxygen  Post-op Assessment: Report given to RN and Post -op Vital signs reviewed and stable  Post vital signs: Reviewed and stable  Last Vitals:  Vitals Value Taken Time  BP 149/72 07/18/2018  1:58 PM  Temp 36.3 C 07/18/2018  2:00 PM  Pulse 73 07/18/2018  2:07 PM  Resp 18 07/18/2018  2:07 PM  SpO2 100 % 07/18/2018  2:07 PM  Vitals shown include unvalidated device data.  Last Pain:  Vitals:   07/18/18 1400  TempSrc:   PainSc: 0-No pain         Complications: No apparent anesthesia complications

## 2018-07-18 NOTE — Anesthesia Postprocedure Evaluation (Signed)
Anesthesia Post Note  Patient: Ashley [name]  Procedure(s) Performed: LAPAROSCOPIC CHOLECYSTECTOMY (N/A )  Patient location during evaluation: PACU Anesthesia Type: General Level of consciousness: awake and alert Pain management: pain level controlled Vital Signs Assessment: post-procedure vital signs reviewed and stable Respiratory status: spontaneous breathing, nonlabored ventilation, respiratory function stable and patient connected to nasal cannula oxygen Cardiovascular status: blood pressure returned to baseline and stable Postop Assessment: no apparent nausea or vomiting Anesthetic complications: no     Last Vitals:  Vitals:   07/18/18 1415 07/18/18 1430  BP: (!) 144/66 131/62  Pulse: 78 65  Resp: 18 20  Temp:    SpO2: 96% 92%    Last Pain:  Vitals:   07/18/18 1445  TempSrc:   PainSc: Asleep                 Precious Haws Piscitello

## 2018-07-18 NOTE — Anesthesia Preprocedure Evaluation (Signed)
Anesthesia Evaluation  Patient identified by MRN, date of birth, ID band Patient awake    Reviewed: Allergy & Precautions, H&P , NPO status , Patient's Chart, lab work & pertinent test results  History of Anesthesia Complications Negative for: history of anesthetic complications  Airway Mallampati: III  TM Distance: >3 FB Neck ROM: limited    Dental  (+) Poor Dentition, Edentulous Upper, Edentulous Lower   Pulmonary shortness of breath and with exertion, COPD, former smoker,           Cardiovascular Exercise Tolerance: Good hypertension, + CAD, + Past MI and + Peripheral Vascular Disease  (-) CHF      Neuro/Psych PSYCHIATRIC DISORDERS negative neurological ROS     GI/Hepatic Neg liver ROS, GERD  ,  Endo/Other  negative endocrine ROS  Renal/GU      Musculoskeletal  (+) Arthritis ,   Abdominal   Peds  Hematology negative hematology ROS (+)   Anesthesia Other Findings Past Medical History: No date: Anemia No date: Anxiety No date: Arthritis No date: COPD (chronic obstructive pulmonary disease) (HCC) No date: Coronary artery disease No date: Edema     Comment:  feet/ankles   wears support hose No date: GERD (gastroesophageal reflux disease) No date: GI (gastrointestinal bleed)     Comment:  history No date: Hypertension No date: Myocardial infarction ([place]) No date: Peripheral vascular disease (HCC) No date: Shortness of breath dyspnea  Past Surgical History: No date: ABDOMINAL HYSTERECTOMY No date: ABLATION     Comment:  right leg less than 1 year ago No date: CATARACT EXTRACTION W/ INTRAOCULAR LENS  IMPLANT, BILATERAL;  Bilateral No date: CORONARY ANGIOPLASTY     Comment:  stents No date: EYE SURGERY No date: FRACTURE SURGERY     Comment:  right wrist 02/20/2016: TOTAL HIP ARTHROPLASTY; Right     Comment:  Procedure: TOTAL HIP ARTHROPLASTY ANTERIOR APPROACH;                Surgeon: Hessie Knows, MD;   Location: [facility] ORS;  Service:              Orthopedics;  Laterality: Right;  BMI    Body Mass Index:  33.12 kg/m      Reproductive/Obstetrics negative OB ROS                             Anesthesia Physical  Anesthesia Plan  ASA: IV and emergent  Anesthesia Plan: General ETT   Post-op Pain Management:    Induction: Intravenous  PONV Risk Score and Plan: Ondansetron, Dexamethasone, Midazolam and Treatment may vary due to age or medical condition  Airway Management Planned: Oral ETT  Additional Equipment:   Intra-op Plan:   Post-operative Plan: Extubation in OR  Informed Consent: I have reviewed the patients History and Physical, chart, labs and discussed the procedure including the risks, benefits and alternatives for the proposed anesthesia with the patient or authorized representative who has indicated his/her understanding and acceptance.   Dental Advisory Given  Plan Discussed with: Anesthesiologist, CRNA and Surgeon  Anesthesia Plan Comments: (Plan to suspend DNR for procedure.  Patient and family voiced understanding.  Patient and family informed that patient is higher risk for complications from anesthesia during this procedure due to their medical history and age including but not limited to post operative cognitive dysfunction.  They voiced understanding.  Patient consented for risks of anesthesia including but not limited to:  - adverse  reactions to medications - damage to teeth, lips or other oral mucosa - sore throat or hoarseness - Damage to heart, brain, lungs or loss of life  Patient voiced understanding.)        Anesthesia Quick Evaluation

## 2018-07-18 NOTE — Progress Notes (Signed)
Preoperative Review   Patient is met in the preoperatively. The history is reviewed in the chart and with the patient. I personally reviewed the options and rationale as well as the risks of this procedure that have been previously discussed with the patient. All questions asked by the patient and/or family were answered to their satisfaction.  Patient agrees to proceed with this procedure at this time.  Diego Pabon M.D. FACS   

## 2018-07-18 NOTE — Care Management Important Message (Signed)
Copy of signed IM left with patient in room.  

## 2018-07-18 NOTE — Progress Notes (Signed)
[place] [facility] [place] NAME: Ashley [name]    MR#:  [id number]  DATE OF BIRTH:  [birth year]  SUBJECTIVE:   The patient has no complaints this morning. REVIEW OF SYSTEMS:   Review of Systems  Constitutional: Negative for chills, fever and weight loss.  HENT: Negative for ear discharge, ear pain and nosebleeds.   Eyes: Negative for blurred vision, pain and discharge.  Respiratory: Negative for sputum production, wheezing and stridor.   Cardiovascular: Negative for chest pain, palpitations, orthopnea and PND.  Gastrointestinal: Negative for abdominal pain, diarrhea, nausea and vomiting.  Genitourinary: Negative for frequency and urgency.  Musculoskeletal: Negative for back pain and joint pain.  Neurological: Negative for sensory change, speech change, focal weakness and weakness.  Psychiatric/Behavioral: Negative for depression and hallucinations. The patient is not nervous/anxious.     DRUG ALLERGIES:  No Known Allergies  VITALS:  Blood pressure (!) 118/58, pulse 76, temperature (!) 97.3 F (36.3 C), resp. rate 16, height 4\' 11"  (1.499 m), weight 74.8 kg, SpO2 92 %.  PHYSICAL EXAMINATION:   Physical Exam  GENERAL:  84 y.o.-year-old patient lying in the bed with no acute distress.  EYES: Pupils equal, round, reactive to light and accommodation. No scleral icterus. Extraocular muscles intact.  HEENT: Head atraumatic, normocephalic. Oropharynx and nasopharynx clear.  NECK:  Supple, no jugular venous distention. No thyroid enlargement, no tenderness.  LUNGS: Normal breath sounds bilaterally, no wheezing, no rales, no rhonchi. No use of accessory muscles of respiration.  CARDIOVASCULAR: S1, S2 normal. No murmurs, rubs, or gallops.  ABDOMEN: Soft, no tenderness, nondistended. Bowel sounds present. No organomegaly or mass.  EXTREMITIES: No cyanosis, clubbing or edema b/l.    NEUROLOGIC: Cranial nerves II through XII are intact. No focal  Motor or sensory deficits b/l.   PSYCHIATRIC:  patient is alert and oriented x 3.  SKIN: No obvious rash, lesion, or ulcer.   LABORATORY PANEL:  CBC Recent Labs  Lab 07/15/18 0411 07/15/18 1719  WBC 12.3*  --   HGB 13.1 11.9*  HCT 40.2 36.8  PLT 104*  --     Chemistries  Recent Labs  Lab 07/15/18 0411  07/18/18 0438  NA 136  --   --   K 3.3*  --   --   CL 103  --   --   CO2 20*  --   --   GLUCOSE 130*  --   --   BUN 33*  --   --   CREATININE 0.84  --   --   CALCIUM 8.6*  --   --   AST 256*   < > 20  ALT 449*   < > 123*  ALKPHOS 330*   < > 128*  BILITOT 6.6*   < > 1.0   < > = values in this interval not displayed.   Cardiac Enzymes Recent Labs  Lab 07/15/18 2230  TROPONINI 0.03*   RADIOLOGY:  No results found. ASSESSMENT AND PLAN:   NevaGuthrieis an84 y.o.femalewith a known history of coronary artery disease status post MI in the past, COPD, PAD, hypertension, anemia and other comorbidities. Patient presented to emergency room for acute onset of nausea vomiting and right upper quadrant abdominal pain.   1. acute Choledocholithiasis/cholelithiasis: -patient came in with abdominal pain and several episodes of vomiting after she was discharged at home.  s/p ERCP--- CBD stones x2 removed Lap chole by Dr Dahlia Byes today.  Abnormal liver function test due  to above.  Improving.  2.  Hypertension: Controlled; continue Isordil, lisinopril and amlodipine  3.  Coronary artery disease: Stable. -Elevation in troponin likely secondary to demand ischemia. Pt clinically does not appear to be  in heart failure. -ECH0 done 2 days ago negative for WMA and was seen by cardiology -seen by  DR fath she is optimized from cardiac standpoint  4.  CHF: Chronic; diastolic.  Continue metoprolol clinically does not appear to be in HF  5.  DVT prophylaxis: SCDs  Generalized weakness. home health and PT.  Case discussed with Care Management/Social Worker. Management plans  discussed with the patient, her daughter and they are in agreement.  CODE STATUS: DNR  DVT Prophylaxis: SCD  TOTAL TIME TAKING CARE OF THIS PATIENT: 33 minutes.  >50% time spent on counselling and coordination of care  POSSIBLE D/C IN 2-3 DAYS, DEPENDING ON CLINICAL CONDITION.  Note: This dictation was prepared with Dragon dictation along with smaller phrase technology. Any transcriptional errors that result from this process are unintentional.  Demetrios Loll M.D on 07/18/2018 at 4:42 PM  Between 7am to 6pm - Pager - [phone]  After 6pm go to [email or web address] - password [email or web address] [facility]  Office  [phone]  CC: Primary care physician; Albina Billet, MDPatient ID: Ashley [name], female   DOB: 1931-07-09, 84 y.o.   MRN: [id number]

## 2018-07-18 NOTE — Op Note (Signed)
Laparoscopic Cholecystectomy  Pre-operative Diagnosis: Choledocholithiasis and cholecystitis  Post-operative Diagnosis: same   Surgeon: Caroleen Hamman, MD FACS  Anesthesia: Gen. with endotracheal tube   Findings: Cholecystitis   Estimated Blood Loss: 5cc                 Specimens: Gallbladder           Complications: none   Procedure Details  The patient was seen again in the Holding Room. The benefits, complications, treatment options, and expected outcomes were discussed with the patient. The risks of bleeding, infection, recurrence of symptoms, failure to resolve symptoms, bile duct damage, bile duct leak, retained common bile duct stone, bowel injury, any of which could require further surgery and/or ERCP, stent, or papillotomy were reviewed with the patient. The likelihood of improving the patient's symptoms with return to their baseline status is good.  The patient and/or family concurred with the proposed plan, giving informed consent.  The patient was taken to Operating Room, identified as Ashley [name] and the procedure verified as Laparoscopic Cholecystectomy.  A Time Out was held and the above information confirmed.  Prior to the induction of general anesthesia, antibiotic prophylaxis was administered. VTE prophylaxis was in place. General endotracheal anesthesia was then administered and tolerated well. After the induction, the abdomen was prepped with Chloraprep and draped in the sterile fashion. The patient was positioned in the supine position.  Cut down technique was used to enter the abdominal cavity and a Hasson trochar was placed after two vicryl stitches were anchored to the fascia. Pneumoperitoneum was then created with CO2 and tolerated well without any adverse changes in the patient's vital signs.  Three 5-mm ports were placed in the right upper quadrant all under direct vision. All skin incisions  were infiltrated with a local anesthetic agent before making the  incision and placing the trocars.   The patient was positioned  in reverse Trendelenburg, tilted slightly to the patient's left.  The gallbladder was identified, the fundus grasped and retracted cephalad. Adhesions were lysed bluntly. The infundibulum was grasped and retracted laterally, exposing the peritoneum overlying the triangle of Calot. This was then divided and exposed in a blunt fashion. An extended critical view of the cystic duct and cystic artery was obtained.  The cystic duct was clearly identified and bluntly dissected.   Artery and duct were double clipped and divided.  The gallbladder was taken from the gallbladder fossa in a retrograde fashion with the electrocautery. The gallbladder was removed and placed in an Endocatch bag. The liver bed was irrigated and inspected. Hemostasis was achieved with the electrocautery. Copious irrigation was utilized and was repeatedly aspirated until clear.  The gallbladder and Endocatch sac were then removed through a port site.    Inspection of the right upper quadrant was performed. No bleeding, bile duct injury or leak, or bowel injury was noted. Pneumoperitoneum was released.  The periumbilical port site was closed with interrumpted 0 Vicryl sutures. 4-0 subcuticular Monocryl was used to close the skin. Dermabond was  applied.  The patient was then extubated and brought to the recovery room in stable condition. Sponge, lap, and needle counts were correct at closure and at the conclusion of the case.               Caroleen Hamman, MD, FACS

## 2018-07-18 NOTE — Progress Notes (Signed)
Patient returned from procedure, VSS, patient is asleep but rousable. 4 surgical sites on abdomen assessed, clean dry, intact. No issues. Will continue to assess and monitor.

## 2018-07-18 NOTE — Anesthesia Post-op Follow-up Note (Signed)
Anesthesia QCDR form completed.        

## 2018-07-18 NOTE — Anesthesia Procedure Notes (Signed)
Procedure Name: Intubation Date/Time: 07/18/2018 12:39 PM Performed by: Justus Memory, CRNA Pre-anesthesia Checklist: Patient identified, Patient being monitored, Timeout performed, Emergency Drugs available and Suction available Patient Re-evaluated:Patient Re-evaluated prior to induction Oxygen Delivery Method: Circle system utilized Preoxygenation: Pre-oxygenation with 100% oxygen Induction Type: IV induction Ventilation: Mask ventilation without difficulty Laryngoscope Size: Mac and 3 Grade View: Grade I Tube type: Oral Tube size: 7.0 mm Number of attempts: 1 Airway Equipment and Method: Stylet Placement Confirmation: ETT inserted through vocal cords under direct vision,  positive ETCO2 and breath sounds checked- equal and bilateral Secured at: 19 cm Tube secured with: Tape Dental Injury: Teeth and Oropharynx as per pre-operative assessment

## 2018-07-19 ENCOUNTER — Encounter: Payer: Self-pay | Admitting: Surgery

## 2018-07-19 LAB — COMPREHENSIVE METABOLIC PANEL
ALT: 103 U/L — ABNORMAL HIGH (ref 0–44)
AST: 40 U/L (ref 15–41)
Albumin: 2.4 g/dL — ABNORMAL LOW (ref 3.5–5.0)
Alkaline Phosphatase: 110 U/L (ref 38–126)
Anion gap: 5 (ref 5–15)
BUN: 24 mg/dL — ABNORMAL HIGH (ref 8–23)
CHLORIDE: 105 mmol/L (ref 98–111)
CO2: 26 mmol/L (ref 22–32)
CREATININE: 0.59 mg/dL (ref 0.44–1.00)
Calcium: 8.1 mg/dL — ABNORMAL LOW (ref 8.9–10.3)
GFR calc Af Amer: >60 mL/min (ref >60)
Glucose, Bld: 116 mg/dL — ABNORMAL HIGH (ref 70–99)
Potassium: 4.2 mmol/L (ref 3.5–5.1)
Sodium: 136 mmol/L (ref 135–145)
Total Bilirubin: 0.9 mg/dL (ref 0.3–1.2)
Total Protein: 5 g/dL — ABNORMAL LOW (ref 6.5–8.1)

## 2018-07-19 LAB — CBC
HCT: 33.1 % — ABNORMAL LOW (ref 35.0–47.0)
Hemoglobin: 10.7 g/dL — ABNORMAL LOW (ref 12.0–16.0)
MCH: 25.4 pg — AB (ref 26.0–34.0)
MCHC: 32.4 g/dL (ref 32.0–36.0)
MCV: 78.5 fL — AB (ref 80.0–100.0)
Platelets: 110 10*3/uL — ABNORMAL LOW (ref 150–440)
RBC: 4.21 MIL/uL (ref 3.80–5.20)
RDW: 16.7 % — ABNORMAL HIGH (ref 11.5–14.5)
WBC: 12.1 10*3/uL — ABNORMAL HIGH (ref 3.6–11.0)

## 2018-07-19 LAB — MAGNESIUM: MAGNESIUM: 2.1 mg/dL (ref 1.7–2.4)

## 2018-07-19 NOTE — Progress Notes (Signed)
[facility] Progress Note  1 Day Post-Op  Subjective: She is sitting up this morning enjoying breakfast. Notes mild abdominal soreness. No nausea, emesis, or fevers. Overall doing well.   Objective: Vital signs in last 24 hours: Temp:  [96.1 F (35.6 C)-98.4 F (36.9 C)] 98.4 F (36.9 C) (09/24 0757) Pulse Rate:  [65-80] 77 (09/24 0757) Resp:  [16-22] 18 (09/24 0757) BP: (112-149)/(58-72) 143/69 (09/24 0757) SpO2:  [91 %-100 %] 93 % (09/24 0757) Weight:  [76.2 kg] 76.2 kg (09/24 0401) Last BM Date: 07/18/18  Intake/Output from previous day: 09/23 0701 - 09/24 0700 In: 740 [P.O.:240; I.V.:500] Out: 603 [Urine:600; Blood:3] Intake/Output this shift: No intake/output data recorded.  PE: Gen:  Alert, NAD, pleasant Pulm:  Normal effort Abd: Soft, non-tender, non-distended, laparoscopic incisions C/D/I without erythema.  Skin: warm and dry, no rashes  Psych: A&Ox3   Lab Results:  Recent Labs    07/19/18 0308  WBC 12.1*  HGB 10.7*  HCT 33.1*  PLT 110*   BMET Recent Labs    07/19/18 0308  NA 136  K 4.2  CL 105  CO2 26  GLUCOSE 116*  BUN 24*  CREATININE 0.59  CALCIUM 8.1*   PT/INR No results for input(s): LABPROT, INR in the last 72 hours. CMP     Component Value Date/Time   NA 136 07/19/2018 0308   K 4.2 07/19/2018 0308   CL 105 07/19/2018 0308   CO2 26 07/19/2018 0308   GLUCOSE 116 (H) 07/19/2018 0308   BUN 24 (H) 07/19/2018 0308   CREATININE 0.59 07/19/2018 0308   CALCIUM 8.1 (L) 07/19/2018 0308   PROT 5.0 (L) 07/19/2018 0308   ALBUMIN 2.4 (L) 07/19/2018 0308   AST 40 07/19/2018 0308   ALT 103 (H) 07/19/2018 0308   ALKPHOS 110 07/19/2018 0308   BILITOT 0.9 07/19/2018 0308   GFRNONAA >60 07/19/2018 0308   GFRAA >60 07/19/2018 0308   Lipase     Component Value Date/Time   LIPASE 43 07/15/2018 0411       Studies/Results: No results found.  Anti-infectives: Anti-infectives (From admission, onward)   Start     Dose/Rate  Route Frequency Ordered Stop   07/18/18 1230  ceFAZolin (ANCEF) IVPB 2g/100 mL premix     2 g 200 mL/hr over 30 Minutes Intravenous  Once 07/18/18 1220 07/18/18 1243   07/18/18 1212  ceFAZolin (ANCEF) 2-4 GM/100ML-% IVPB    Note to Pharmacy:  Norton Blizzard  : cabinet override      07/18/18 1212 07/18/18 1238   07/15/18 0645  cefTRIAXone (ROCEPHIN) 1 g in sodium chloride 0.9 % 100 mL IVPB     1 g 200 mL/hr over 30 Minutes Intravenous  Once 07/15/18 5366 07/15/18 0754       Assessment/Plan  Choledocholithiasis  - POD1 s/p cholecystectomy, she is doing well with minimal abdominal soreness, tolerating a diet, no nausea or emesis.  - Mild leukocytosis this morning, relatively stable since admission and may be reactive from surgery.  - LFTs and T bili have normalized.  - She is cleared from general surgery standpoint, and she will follow up in 2 weeks, will provide post-op care instructions.  - Continue pain control while in hospital    LOS: 4 days    Edison Simon , [facility] 07/19/2018, 8:50 AM [phone] M-F: 7am - 4pm

## 2018-07-19 NOTE — Discharge Summary (Signed)
[place] at [place] NAME: Ashley [name]    MR#:  [id number]  DATE OF BIRTH:  05-09-1931  DATE OF ADMISSION:  07/15/2018   ADMITTING PHYSICIAN: Harrie Foreman, MD  DATE OF DISCHARGE: 07/19/2018 PRIMARY CARE PHYSICIAN: Albina Billet, MD   ADMISSION DIAGNOSIS:  Choledocholithiasis [K80.50] Cholecystitis [K81.9] Acute pulmonary edema ([place]) [J81.0] Pain [R52] Elevated troponin [R74.8] DISCHARGE DIAGNOSIS:  Active Problems:   Choledocholithiasis   Cholecystitis  SECONDARY DIAGNOSIS:   Past Medical [name]:  Diagnosis Date  . Anemia   . Anxiety   . Arthritis   . COPD (chronic obstructive pulmonary disease) ([place])   . Coronary artery disease   . Edema    feet/ankles   wears support hose  . GERD (gastroesophageal reflux disease)   . GI (gastrointestinal bleed)    [name]  . Hypertension   . Myocardial infarction ([place])   . Peripheral vascular disease ([place])   . Shortness of breath dyspnea    HOSPITAL COURSE:  Ashley [name] of coronary artery disease status post MI in the past, COPD, PAD, hypertension, anemia and other comorbidities. Patient presented to emergency room for acute onset of nausea vomiting and right upper quadrant abdominal pain.   1. acute Choledocholithiasis/cholelithiasis: -patient came in with abdominal pain and several episodes of vomiting after she was discharged at home.  s/p ERCP--- CBD stones x2 removed Lap chole by Dr Dahlia Byes, POD1.  The patient has no complaints, she tolerated diet.  Abnormal liver function test due to above.  Improving.  2. Hypertension: Controlled; continue Isordil, lisinopril and amlodipine  3. Coronary artery disease: Stable. -Elevation in troponin likely secondary to demand ischemia. Pt clinically does not appear to be  in heart failure. -ECH0 done 2 days ago negative for WMA and was seen by cardiology -seen by  DR fath she is  optimized from cardiac standpoint  4. CHF: Chronic; diastolic. Continue metoprolol clinically does not appear to be in HF  5. DVT prophylaxis: SCDs  Generalized weakness. home health and PT.  DISCHARGE CONDITIONS:  Stable, discharged to home with home health and PT today. CONSULTS OBTAINED:  Treatment Team:  Jules Husbands, MD Teodoro Spray, MD DRUG ALLERGIES:  No Known Allergies DISCHARGE MEDICATIONS:   Allergies as of 07/19/2018   No Known Allergies     Medication List    TAKE these medications   ALPRAZolam 0.25 MG tablet Commonly known as:  XANAX Take 0.25 mg by mouth 2 (two) times daily. As needed usually just at bedtime   amLODipine 10 MG tablet Commonly known as:  NORVASC Take 10 mg by mouth daily.   cholecalciferol 1000 units tablet Commonly known as:  VITAMIN D Take 1,000 Units by mouth daily.   COMBIVENT RESPIMAT 20-100 MCG/ACT Aers respimat Generic drug:  Ipratropium-Albuterol Inhale 1 puff into the lungs every 6 (six) hours.   ibuprofen 200 MG tablet Commonly known as:  ADVIL,MOTRIN Take 1 tablet (200 mg total) by mouth every 8 (eight) hours as needed.   isosorbide dinitrate 30 MG tablet Commonly known as:  ISORDIL Take 30 mg by mouth 2 (two) times daily.   lisinopril 10 MG tablet Commonly known as:  PRINIVIL,ZESTRIL Take 5 mg by mouth daily.   metoprolol succinate 50 MG 24 hr tablet Commonly known as:  TOPROL-XL Take 75 mg by mouth daily. Take with or immediately following a meal.   nitroGLYCERIN 0.4 MG SL tablet Commonly known as:  NITROSTAT Place 1 tablet (0.4 mg total) under the tongue every 5 (five) minutes as needed for chest pain.   omeprazole 20 MG capsule Commonly known as:  PRILOSEC Take 20 mg by mouth daily.   simvastatin 40 MG tablet Commonly known as:  ZOCOR Take 40 mg by mouth daily.   vitamin B-12 1000 MCG tablet Commonly known as:  CYANOCOBALAMIN Take 1,000 mcg by mouth daily.        DISCHARGE  INSTRUCTIONS:  See AVS. If you experience worsening of your admission symptoms, develop shortness of breath, life threatening emergency, suicidal or homicidal thoughts you must seek medical attention immediately by calling 911 or calling your MD immediately  if symptoms less severe.  You Must read complete instructions/literature along with all the possible adverse reactions/side effects for all the Medicines you take and that have been prescribed to you. Take any new Medicines after you have completely understood and accpet all the possible adverse reactions/side effects.   Please note  You were cared for by a hospitalist during your hospital stay. If you have any questions about your discharge medications or the care you received while you were in the hospital after you are discharged, you can call the unit and asked to speak with the hospitalist on call if the hospitalist that took care of you is not available. Once you are discharged, your primary care physician will handle any further medical issues. Please note that NO REFILLS for any discharge medications will be authorized once you are discharged, as it is imperative that you return to your primary care physician (or establish a relationship with a primary care physician if you do not have one) for your aftercare needs so that they can reassess your need for medications and monitor your lab values.    On the day of Discharge:  VITAL SIGNS:  Blood pressure (!) 143/69, pulse 77, temperature 98.4 F (36.9 C), temperature source Oral, resp. rate 18, height 4\' 11"  (1.499 m), weight 76.2 kg, SpO2 93 %. PHYSICAL EXAMINATION:  GENERAL:  84 y.o.-year-old patient lying in the bed with no acute distress.  EYES: Pupils equal, round, reactive to light and accommodation. No scleral icterus. Extraocular muscles intact.  HEENT: Head atraumatic, normocephalic. Oropharynx and nasopharynx clear.  NECK:  Supple, no jugular venous distention. No thyroid  enlargement, no tenderness.  LUNGS: Normal breath sounds bilaterally, no wheezing, rales,rhonchi or crepitation. No use of accessory muscles of respiration.  CARDIOVASCULAR: S1, S2 normal. No murmurs, rubs, or gallops.  ABDOMEN: Soft, non-tender, non-distended. Bowel sounds present. No organomegaly or mass.  EXTREMITIES: No pedal edema, cyanosis, or clubbing.  NEUROLOGIC: Cranial nerves II through XII are intact. Muscle strength 5/5 in all extremities. Sensation intact. Gait not checked.  PSYCHIATRIC: The patient is alert and oriented x 3.  SKIN: No obvious rash, lesion, or ulcer.  DATA REVIEW:   CBC Recent Labs  Lab 07/19/18 0308  WBC 12.1*  HGB 10.7*  HCT 33.1*  PLT 110*    Chemistries  Recent Labs  Lab 07/19/18 0308  NA 136  K 4.2  CL 105  CO2 26  GLUCOSE 116*  BUN 24*  CREATININE 0.59  CALCIUM 8.1*  MG 2.1  AST 40  ALT 103*  ALKPHOS 110  BILITOT 0.9     Microbiology Results  Results for orders placed or performed during the hospital encounter of 02/12/16  Urine culture     Status: None   Collection Time: 02/12/16  2:07 PM  Result Value  Ref Range Status   Specimen Description URINE, RANDOM  Final   Special Requests Normal  Final   Culture MULTIPLE SPECIES PRESENT, SUGGEST RECOLLECTION  Final   Report Status 02/14/2016 FINAL  Final  Surgical pcr screen     Status: None   Collection Time: 02/12/16  2:07 PM  Result Value Ref Range Status   MRSA, PCR NEGATIVE NEGATIVE Final   Staphylococcus aureus NEGATIVE NEGATIVE Final    Comment:        The Xpert SA Assay (FDA approved for NASAL specimens in patients over 26 years of age), is one component of a comprehensive surveillance program.  Test performance has been validated by [facility] for patients greater than or equal to 89 year old. It is not intended to diagnose infection nor to guide or monitor treatment.     RADIOLOGY:  No results found.   Management plans discussed with the patient, her  daughter and they are in agreement.  CODE STATUS: DNR   TOTAL TIME TAKING CARE OF THIS PATIENT: 32 minutes.    Demetrios Loll M.D on 07/19/2018 at 12:40 PM  Between 7am to 6pm - Pager - [phone]  After 6pm go to [email or web address] - Proofreader  [facility]  Office  [phone]  CC: Primary care physician; Albina Billet, MD   Note: This dictation was prepared with Dragon dictation along with smaller phrase technology. Any transcriptional errors that result from this process are unintentional.

## 2018-07-19 NOTE — Plan of Care (Signed)

## 2018-07-19 NOTE — Discharge Instructions (Signed)
HHPT  Post-Operative Care Instructions - Shower starting tomorrow 09/15, do not submerge incisions in water - Glue will fall off in 7-10 days, do not peel off - No lifting more than 20 lbs for 6 weeks - Tylenol, Motrin, and Ice are great for pain control.  - Call MD if you develop, fevers, severe abdominal pain, emesis, or redness/drainage from incisions

## 2018-07-19 NOTE — Care Management Note (Signed)
Case Management Note  Patient Details  Name: Ashley [name] MRN: [id number] Date of Birth: 29-Aug-1931  Subjective/Objective:      Patient admitted with Cholecystitis and Choledocholithiasis.  From home, lives alone.  Has friends from church that check on her and provide transportation.  Current with PCP.  On room air.  Setting up home health services for RN, and PT.  Choice provided to patient and [facility] has accepted her.  Denies difficulty obtaining medications.  Informed patient to answer phone even if she does not recognize the caller as it may be [facility] trying to set up a visit.              Action/Plan:   Expected Discharge Date:  07/19/18               Expected Discharge Plan:  [place]  In-House Referral:     Discharge planning Services  CM Consult  Post Acute Care Choice:    Choice offered to:  Patient  DME Arranged:    DME Agency:     HH Arranged:  RN, PT [place] Agency:  [facility]  Status of Service:  Completed, signed off  If discussed at [place] of Stay Meetings, dates discussed:    Additional Comments:  Elza Rafter, RN 07/19/2018, 1:21 PM

## 2018-07-20 LAB — SURGICAL PATHOLOGY

## 2018-07-27 ENCOUNTER — Encounter: Payer: Self-pay | Admitting: Surgery

## 2018-07-27 ENCOUNTER — Ambulatory Visit (INDEPENDENT_AMBULATORY_CARE_PROVIDER_SITE_OTHER): Payer: Medicare Other | Admitting: Surgery

## 2018-07-27 VITALS — BP 123/70 | HR 81 | Temp 98.1°F | Resp 14 | Ht 60.0 in | Wt 169.0 lb

## 2018-07-27 DIAGNOSIS — Z09 Encounter for follow-up examination after completed treatment for conditions other than malignant neoplasm: Secondary | ICD-10-CM

## 2018-07-27 NOTE — Progress Notes (Signed)
S/p lap chole Doing well Pathology d./w pt No complaints Taking PO  PE NAD Abd; soft, nt , incisions c/d/i, no peritonitis  A/P  Doing well RTC prn

## 2018-07-27 NOTE — Patient Instructions (Signed)
Return as needed.The patient is aware to call back for any questions or concerns.  

## 2020-07-12 ENCOUNTER — Other Ambulatory Visit: Payer: Self-pay

## 2020-07-12 ENCOUNTER — Inpatient Hospital Stay
Admission: EM | Admit: 2020-07-12 | Discharge: 2020-07-24 | DRG: 064 | Disposition: A | Discharge status: Home Health | Payer: Medicare Other | Attending: Internal Medicine | Admitting: Internal Medicine

## 2020-07-12 ENCOUNTER — Encounter: Payer: Self-pay | Admitting: Emergency Medicine

## 2020-07-12 DIAGNOSIS — M1990 Unspecified osteoarthritis, unspecified site: Secondary | ICD-10-CM | POA: Diagnosis present

## 2020-07-12 DIAGNOSIS — Z955 Presence of coronary angioplasty implant and graft: Secondary | ICD-10-CM

## 2020-07-12 DIAGNOSIS — D62 Acute posthemorrhagic anemia: Secondary | ICD-10-CM | POA: Diagnosis present

## 2020-07-12 DIAGNOSIS — Z87891 Personal history of nicotine dependence: Secondary | ICD-10-CM

## 2020-07-12 DIAGNOSIS — Z66 Do not resuscitate: Secondary | ICD-10-CM | POA: Diagnosis present

## 2020-07-12 DIAGNOSIS — J9601 Acute respiratory failure with hypoxia: Secondary | ICD-10-CM | POA: Diagnosis present

## 2020-07-12 DIAGNOSIS — I2510 Atherosclerotic heart disease of native coronary artery without angina pectoris: Secondary | ICD-10-CM | POA: Diagnosis present

## 2020-07-12 DIAGNOSIS — D649 Anemia, unspecified: Secondary | ICD-10-CM

## 2020-07-12 DIAGNOSIS — Z96641 Presence of right artificial hip joint: Secondary | ICD-10-CM | POA: Diagnosis present

## 2020-07-12 DIAGNOSIS — E669 Obesity, unspecified: Secondary | ICD-10-CM | POA: Diagnosis present

## 2020-07-12 DIAGNOSIS — Z9981 Dependence on supplemental oxygen: Secondary | ICD-10-CM

## 2020-07-12 DIAGNOSIS — I739 Peripheral vascular disease, unspecified: Secondary | ICD-10-CM | POA: Diagnosis present

## 2020-07-12 DIAGNOSIS — Z20822 Contact with and (suspected) exposure to covid-19: Secondary | ICD-10-CM | POA: Diagnosis present

## 2020-07-12 DIAGNOSIS — E876 Hypokalemia: Secondary | ICD-10-CM | POA: Diagnosis not present

## 2020-07-12 DIAGNOSIS — I472 Ventricular tachycardia: Secondary | ICD-10-CM | POA: Diagnosis not present

## 2020-07-12 DIAGNOSIS — I639 Cerebral infarction, unspecified: Principal | ICD-10-CM | POA: Diagnosis present

## 2020-07-12 DIAGNOSIS — R0600 Dyspnea, unspecified: Secondary | ICD-10-CM

## 2020-07-12 DIAGNOSIS — I10 Essential (primary) hypertension: Secondary | ICD-10-CM | POA: Diagnosis present

## 2020-07-12 DIAGNOSIS — Z6834 Body mass index (BMI) 34.0-34.9, adult: Secondary | ICD-10-CM

## 2020-07-12 DIAGNOSIS — I110 Hypertensive heart disease with heart failure: Secondary | ICD-10-CM | POA: Diagnosis present

## 2020-07-12 DIAGNOSIS — I5033 Acute on chronic diastolic (congestive) heart failure: Secondary | ICD-10-CM | POA: Diagnosis not present

## 2020-07-12 DIAGNOSIS — H5347 Heteronymous bilateral field defects: Secondary | ICD-10-CM | POA: Diagnosis present

## 2020-07-12 DIAGNOSIS — R29701 NIHSS score 1: Secondary | ICD-10-CM | POA: Diagnosis present

## 2020-07-12 DIAGNOSIS — K922 Gastrointestinal hemorrhage, unspecified: Secondary | ICD-10-CM

## 2020-07-12 DIAGNOSIS — G9341 Metabolic encephalopathy: Secondary | ICD-10-CM | POA: Diagnosis present

## 2020-07-12 DIAGNOSIS — J449 Chronic obstructive pulmonary disease, unspecified: Secondary | ICD-10-CM | POA: Diagnosis present

## 2020-07-12 DIAGNOSIS — E785 Hyperlipidemia, unspecified: Secondary | ICD-10-CM | POA: Diagnosis present

## 2020-07-12 DIAGNOSIS — K5900 Constipation, unspecified: Secondary | ICD-10-CM | POA: Diagnosis not present

## 2020-07-12 DIAGNOSIS — R0602 Shortness of breath: Secondary | ICD-10-CM

## 2020-07-12 DIAGNOSIS — I5030 Unspecified diastolic (congestive) heart failure: Secondary | ICD-10-CM | POA: Diagnosis present

## 2020-07-12 DIAGNOSIS — K219 Gastro-esophageal reflux disease without esophagitis: Secondary | ICD-10-CM | POA: Diagnosis present

## 2020-07-12 DIAGNOSIS — I671 Cerebral aneurysm, nonruptured: Secondary | ICD-10-CM | POA: Diagnosis present

## 2020-07-12 DIAGNOSIS — I252 Old myocardial infarction: Secondary | ICD-10-CM

## 2020-07-12 LAB — COMPREHENSIVE METABOLIC PANEL
ALT: 12 U/L (ref 0–44)
AST: 14 U/L — ABNORMAL LOW (ref 15–41)
Albumin: 3.6 g/dL (ref 3.5–5.0)
Alkaline Phosphatase: 55 U/L (ref 38–126)
Anion gap: 7 (ref 5–15)
BUN: 29 mg/dL — ABNORMAL HIGH (ref 8–23)
CO2: 25 mmol/L (ref 22–32)
Calcium: 9 mg/dL (ref 8.9–10.3)
Chloride: 105 mmol/L (ref 98–111)
Creatinine, Ser: 1.07 mg/dL — ABNORMAL HIGH (ref 0.44–1.00)
GFR calc Af Amer: 54 mL/min — ABNORMAL LOW (ref >60)
GFR calc non Af Amer: 47 mL/min — ABNORMAL LOW (ref >60)
Glucose, Bld: 122 mg/dL — ABNORMAL HIGH (ref 70–99)
Potassium: 3.4 mmol/L — ABNORMAL LOW (ref 3.5–5.1)
Sodium: 137 mmol/L (ref 135–145)
Total Bilirubin: 0.8 mg/dL (ref 0.3–1.2)
Total Protein: 6.1 g/dL — ABNORMAL LOW (ref 6.5–8.1)

## 2020-07-12 LAB — CBC
HCT: 19.9 % — ABNORMAL LOW (ref 36.0–46.0)
Hemoglobin: 4.7 g/dL — CL (ref 12.0–15.0)
MCH: 15.3 pg — ABNORMAL LOW (ref 26.0–34.0)
MCHC: 23.6 g/dL — ABNORMAL LOW (ref 30.0–36.0)
MCV: 64.8 fL — ABNORMAL LOW (ref 80.0–100.0)
Platelets: 240 10*3/uL (ref 150–400)
RBC: 3.07 MIL/uL — ABNORMAL LOW (ref 3.87–5.11)
RDW: 20.3 % — ABNORMAL HIGH (ref 11.5–15.5)
WBC: 5.3 10*3/uL (ref 4.0–10.5)
nRBC: 0.8 % — ABNORMAL HIGH (ref 0.0–0.2)

## 2020-07-12 LAB — AMMONIA: Ammonia: 14 umol/L (ref 9–35)

## 2020-07-12 NOTE — ED Triage Notes (Signed)
Pt's son in triage, pt unable to tell RN why she was in here, pt's son report past couple of days pt's cognition had declined but today she did not even recognized him. Pt knows who she is, knows where she is at.

## 2020-07-12 NOTE — ED Triage Notes (Addendum)
First Nurse Note:  Patient coming [facility] from home for AMS. Patient reportedly altered at night from her baseline. Patient AO X 4 for EMS. Patient has bilateral lower extremity edema. Patient put on 2L [place] by EMS (92% on RA at EMS arrival).   Patient's son waiting outside.

## 2020-07-12 NOTE — ED Provider Notes (Signed)
[facility] Emergency Department Provider Note  ____________________________________________   First MD Initiated Contact with Patient 07/12/20 2345     (approximate)  I have reviewed the triage vital signs and the nursing notes.   HISTORY  Chief Complaint Altered Mental Status   HPI Ashley [name] is a 86 y.o. female with below list of previous medical conditions including COPD hypertension peripheral vascular disease myocardial infarction and previous history of GI bleed presents to the emergency department secondary to increasing confusion x3 days.  Patient son states that her cognition is been decreasing over the past 3 days with the worst of it being today.  He states at times that she does not know who he is.  Admits to generalized weakness and foul-smelling urine.      Past Medical History:  Diagnosis Date  . Anemia   . Anxiety   . Arthritis   . COPD (chronic obstructive pulmonary disease) ([place])   . Coronary artery disease   . Edema    feet/ankles   wears support hose  . GERD (gastroesophageal reflux disease)   . GI (gastrointestinal bleed)    history  . Hypertension   . Myocardial infarction ([place])   . Peripheral vascular disease ([place])   . Shortness of breath dyspnea     Patient Active Problem List   Diagnosis Date Noted  . Coronary artery disease 07/13/2020  . Benign hypertension 07/13/2020  . Acute CVA (cerebrovascular accident) ([place]) 07/13/2020  . Acute on chronic blood loss anemia 07/13/2020  . Severe Symptomatic anemia 07/13/2020  . Acute metabolic encephalopathy 63/10/6008  . COPD (chronic obstructive pulmonary disease) ([place]) 07/13/2020  . Cholecystitis   . Choledocholithiasis 07/15/2018  . Chest pain 07/13/2018  . Primary osteoarthritis of right hip 02/20/2016    Past Surgical History:  Procedure Laterality Date  . ABDOMINAL HYSTERECTOMY    . ABLATION     right leg less than 1 year ago  . CATARACT EXTRACTION W/  INTRAOCULAR LENS  IMPLANT, BILATERAL Bilateral   . CHOLECYSTECTOMY N/A 07/18/2018   Procedure: LAPAROSCOPIC CHOLECYSTECTOMY;  Surgeon: Jules Husbands, MD;  Location: [facility] ORS;  Service: General;  Laterality: N/A;  . CORONARY ANGIOPLASTY     stents  . ENDOSCOPIC RETROGRADE CHOLANGIOPANCREATOGRAPHY (ERCP) WITH PROPOFOL N/A 07/15/2018   Procedure: ENDOSCOPIC RETROGRADE CHOLANGIOPANCREATOGRAPHY (ERCP) WITH PROPOFOL;  Surgeon: [name], MD;  Location: [facility] ENDOSCOPY;  Service: Endoscopy;  Laterality: N/A;  . EYE SURGERY    . FRACTURE SURGERY     right wrist  . TOTAL HIP ARTHROPLASTY Right 02/20/2016   Procedure: TOTAL HIP ARTHROPLASTY ANTERIOR APPROACH;  Surgeon: Hessie Knows, MD;  Location: [facility] ORS;  Service: Orthopedics;  Laterality: Right;    Prior to Admission medications   Medication Sig Start Date End Date Taking? Authorizing Provider  ALPRAZolam (XANAX) 0.25 MG tablet Take 0.25 mg by mouth 2 (two) times daily.    Yes [provider]  amLODipine (NORVASC) 10 MG tablet Take 10 mg by mouth daily.   Yes [provider]  Ipratropium-Albuterol (COMBIVENT RESPIMAT) 20-100 MCG/ACT AERS respimat Inhale 1 puff into the lungs every 6 (six) hours.   Yes [provider]  isosorbide dinitrate (ISORDIL) 30 MG tablet Take 30 mg by mouth 2 (two) times daily.   Yes [provider]  lisinopril (PRINIVIL,ZESTRIL) 10 MG tablet Take 10 mg by mouth daily.    Yes [provider]  metoprolol succinate (TOPROL-XL) 50 MG 24 hr tablet Take 75 mg by mouth  daily. Take with or immediately following a meal.   Yes [provider]  nitroGLYCERIN (NITROSTAT) 0.4 MG SL tablet Place 1 tablet (0.4 mg total) under the tongue every 5 (five) minutes as needed for chest pain. 07/14/18  Yes Fritzi Mandes, MD  simvastatin (ZOCOR) 40 MG tablet Take 40 mg by mouth daily.   Yes [provider]  vitamin B-12 (CYANOCOBALAMIN) 1000 MCG tablet Take 1,000 mcg by mouth daily.   Yes  [provider]    Allergies Patient has no known allergies.  No family history on file.  Social History Social History   Tobacco Use  . Smoking status: Former Smoker    Quit date: 02/11/1997    Years since quitting: 23.4  . Smokeless tobacco: Never Used  Vaping Use  . Vaping Use: Never used  Substance Use Topics  . Alcohol use: No  . Drug use: No    Review of Systems Constitutional: No fever/chills Eyes: No visual changes. ENT: No sore throat. Cardiovascular: Denies chest pain. Respiratory: Denies shortness of breath. Gastrointestinal: No abdominal pain.  No nausea, no vomiting.  No diarrhea.  No constipation. Genitourinary: Negative for dysuria. Musculoskeletal: Negative for neck pain.  Negative for back pain. Integumentary: Negative for rash. Neurological: Positive for confusion and generalized weakness   ____________________________________________   PHYSICAL EXAM:  VITAL SIGNS: ED Triage Vitals  Enc Vitals Group     BP 07/12/20 2232 (!) 126/56     Pulse Rate 07/12/20 2232 89     Resp 07/12/20 2232 (!) 28     Temp 07/12/20 2232 98 F (36.7 C)     Temp Source 07/12/20 2232 Oral     SpO2 07/12/20 2232 100 %     Weight 07/12/20 2243 73.5 kg (162 lb)     Height 07/12/20 2243 1.499 m (4\' 11" )     Head Circumference --      Peak Flow --      Pain Score 07/12/20 2243 0     Pain Loc --      Pain Edu? --      Excl. in [place]? --     Constitutional: Alert and oriented.  Eyes: Conjunctivae are pale. Head: Atraumatic. Mouth/Throat: Patient is wearing a mask. Neck: No stridor.  No meningeal signs.   Cardiovascular: Normal rate, regular rhythm. Good peripheral circulation. Grossly normal heart sounds. Respiratory: Normal respiratory effort.  No retractions. Gastrointestinal: Soft and nontender. No distention.  Guaiac positive Musculoskeletal: No lower extremity tenderness nor edema. No gross deformities of extremities. Neurologic:  Normal speech and  language. No gross focal neurologic deficits are appreciated.  Skin:  Skin is warm, dry and intact. Psychiatric: Mood and affect are normal. Speech and behavior are normal.  ____________________________________________   LABS (all labs ordered are listed, but only abnormal results are displayed)  Labs Reviewed  COMPREHENSIVE METABOLIC PANEL - Abnormal; Notable for the following components:      Result Value   Potassium 3.4 (*)    Glucose, Bld 122 (*)    BUN 29 (*)    Creatinine, Ser 1.07 (*)    Total Protein 6.1 (*)    AST 14 (*)    GFR calc non Af Amer 47 (*)    GFR calc Af Amer 54 (*)    All other components within normal limits  CBC - Abnormal; Notable for the following components:   RBC 3.07 (*)    Hemoglobin 4.7 (*)    HCT 19.9 (*)  MCV 64.8 (*)    MCH 15.3 (*)    MCHC 23.6 (*)    RDW 20.3 (*)    nRBC 0.8 (*)    All other components within normal limits  URINALYSIS, COMPLETE (UACMP) WITH MICROSCOPIC - Abnormal; Notable for the following components:   Color, Urine YELLOW (*)    APPearance CLEAR (*)    All other components within normal limits  SARS CORONAVIRUS 2 BY RT PCR (HOSPITAL ORDER, [place] [facility])  AMMONIA  HEMOGLOBIN A1C  LIPID PANEL  CBG MONITORING, ED  PREPARE RBC (CROSSMATCH)  TYPE AND SCREEN     RADIOLOGY I, [place] Ashelyn Mccravy, personally viewed and evaluated these images (plain radiographs) as part of my medical decision making, as well as reviewing the written report by the radiologist.  ED MD interpretation: Right occipital lobe hypodensity consistent with acute or subacute infarct CT head per radiologist.    Official radiology report(s): CT Head Wo Contrast  Result Date: 07/13/2020 CLINICAL DATA:  Altered level of consciousness EXAM: CT HEAD WITHOUT CONTRAST TECHNIQUE: Contiguous axial images were obtained from the base of the skull through the vertex without intravenous contrast. COMPARISON:  None. FINDINGS: Brain:  Hypodensity within the right occipital lobe consistent with acute or subacute infarct. Effacement of the overlying sulci and occipital horn of the right lateral ventricle. No evidence of hemorrhagic conversion. Scattered hypodensities elsewhere throughout the periventricular and subcortical white matter likely reflect chronic small vessel ischemic change. No acute hemorrhage. Lateral ventricles and midline structures are otherwise unremarkable. No acute extra-axial fluid collections. No mass effect. Vascular: No hyperdense vessel or unexpected calcification. Skull: Normal. Negative for fracture or focal lesion. Sinuses/Orbits: No acute finding. Other: None. IMPRESSION: 1. Right occipital lobe hypodensity consistent with acute or subacute infarct. 2. Likely chronic small vessel ischemic changes scattered throughout the white matter. 3. No acute hemorrhage. Critical Value/emergent results were called by telephone at the time of interpretation on 07/13/2020 at 3:16 am to provider [facility] , who verbally acknowledged these results. Electronically Signed   By: Randa Ngo M.D.   On: 07/13/2020 03:21   DG Chest Portable 1 View  Result Date: 07/13/2020 CLINICAL DATA:  Dyspnea EXAM: PORTABLE CHEST 1 VIEW COMPARISON:  07/15/2018 FINDINGS: Small left pleural effusion. Moderate cardiomegaly. No overt pulmonary edema or focal airspace consolidation. IMPRESSION: Cardiomegaly and small left pleural effusion. Electronically Signed   By: Ulyses Jarred M.D.   On: 07/13/2020 00:21    .Critical Care Performed by: Gregor Hams, MD Authorized by: Gregor Hams, MD   Critical care provider statement:    Critical care time (minutes):  30   Critical care time was exclusive of:  Separately billable procedures and treating other patients (Severe anemia, GI bleed, CVA)   Critical care was time spent personally by me on the following activities:  Development of treatment plan with patient or surrogate,  discussions with consultants, evaluation of patient's response to treatment, examination of patient, obtaining history from patient or surrogate, ordering and performing treatments and interventions, ordering and review of laboratory studies, ordering and review of radiographic studies, pulse oximetry, re-evaluation of patient's condition and review of old charts     ____________________________________________   INITIAL IMPRESSION / MDM / [place] / ED COURSE  As part of my medical decision making, I reviewed the following data within the electronic MEDICAL RECORD NUMBER   86 year old female presented with above-stated history and physical exam a differential diagnosis including but not  limited to CVA, metabolic encephalopathy, infectious etiology including UTI and concern for anemia given pale conjunctiva.  Hemoglobin noted to be 4.7.  Patient guaiac positive.  As such patient given 2 units of packed red blood cells.  CT scan of the head revealed right occipital CVA acute versus subacute.  Patient discussed with Dr. Damita Dunnings for hospital admission for further evaluation and management. ____________________________________________  FINAL CLINICAL IMPRESSION(S) / ED DIAGNOSES  Final diagnoses:  Acute CVA (cerebrovascular accident) ([place])  Gastrointestinal hemorrhage, unspecified gastrointestinal hemorrhage type  Anemia, unspecified type     MEDICATIONS GIVEN DURING THIS VISIT:  Medications   stroke: mapping our early stages of recovery book (has no administration in time range)  0.9 %  sodium chloride infusion (has no administration in time range)  acetaminophen (TYLENOL) tablet 650 mg (has no administration in time range)    Or  acetaminophen (TYLENOL) 160 MG/5ML solution 650 mg (has no administration in time range)    Or  acetaminophen (TYLENOL) suppository 650 mg (has no administration in time range)  sodium chloride flush (NS) 0.9 % injection 10-40 mL (has no administration  in time range)  0.9 %  sodium chloride infusion (10 mL/hr Intravenous New Bag/Given 07/13/20 0259)  iohexol (OMNIPAQUE) 350 MG/ML injection 100 mL (100 mLs Intravenous Contrast Given 07/13/20 0222)     ED Discharge Orders    None      *Please note:  MALLISSA LORENZEN was evaluated in Emergency Department on 07/13/2020 for the symptoms described in the history of present illness. She was evaluated in the context of the global COVID-19 pandemic, which necessitated consideration that the patient might be at risk for infection with the SARS-CoV-2 virus that causes COVID-19. Institutional protocols and algorithms that pertain to the evaluation of patients at risk for COVID-19 are in a state of rapid change based on information released by regulatory bodies including the CDC and federal and state organizations. These policies and algorithms were followed during the patient's care in the ED.  Some ED evaluations and interventions may be delayed as a result of limited staffing during and after the pandemic.*  Note:  This document was prepared using Dragon voice recognition software and may include unintentional dictation errors.   Gregor Hams, MD 07/13/20 [place]

## 2020-07-13 ENCOUNTER — Encounter: Payer: Self-pay | Admitting: Radiology

## 2020-07-13 ENCOUNTER — Inpatient Hospital Stay
Admit: 2020-07-13 | Discharge: 2020-07-13 | Disposition: A | Discharge status: Home / Self Care | Payer: Medicare Other | Attending: Internal Medicine | Admitting: Internal Medicine

## 2020-07-13 ENCOUNTER — Emergency Department: Payer: Medicare Other

## 2020-07-13 ENCOUNTER — Inpatient Hospital Stay: Payer: Medicare Other

## 2020-07-13 DIAGNOSIS — J449 Chronic obstructive pulmonary disease, unspecified: Secondary | ICD-10-CM

## 2020-07-13 DIAGNOSIS — I2510 Atherosclerotic heart disease of native coronary artery without angina pectoris: Secondary | ICD-10-CM | POA: Diagnosis present

## 2020-07-13 DIAGNOSIS — G9341 Metabolic encephalopathy: Secondary | ICD-10-CM | POA: Diagnosis present

## 2020-07-13 DIAGNOSIS — D62 Acute posthemorrhagic anemia: Secondary | ICD-10-CM

## 2020-07-13 DIAGNOSIS — I671 Cerebral aneurysm, nonruptured: Secondary | ICD-10-CM | POA: Diagnosis present

## 2020-07-13 DIAGNOSIS — K921 Melena: Secondary | ICD-10-CM | POA: Diagnosis not present

## 2020-07-13 DIAGNOSIS — I252 Old myocardial infarction: Secondary | ICD-10-CM | POA: Diagnosis not present

## 2020-07-13 DIAGNOSIS — I472 Ventricular tachycardia: Secondary | ICD-10-CM | POA: Diagnosis not present

## 2020-07-13 DIAGNOSIS — I5033 Acute on chronic diastolic (congestive) heart failure: Secondary | ICD-10-CM | POA: Diagnosis not present

## 2020-07-13 DIAGNOSIS — Z955 Presence of coronary angioplasty implant and graft: Secondary | ICD-10-CM | POA: Diagnosis not present

## 2020-07-13 DIAGNOSIS — I10 Essential (primary) hypertension: Secondary | ICD-10-CM | POA: Diagnosis not present

## 2020-07-13 DIAGNOSIS — Z96641 Presence of right artificial hip joint: Secondary | ICD-10-CM | POA: Diagnosis present

## 2020-07-13 DIAGNOSIS — Z66 Do not resuscitate: Secondary | ICD-10-CM | POA: Diagnosis present

## 2020-07-13 DIAGNOSIS — Z9981 Dependence on supplemental oxygen: Secondary | ICD-10-CM | POA: Diagnosis not present

## 2020-07-13 DIAGNOSIS — I639 Cerebral infarction, unspecified: Secondary | ICD-10-CM | POA: Diagnosis present

## 2020-07-13 DIAGNOSIS — I110 Hypertensive heart disease with heart failure: Secondary | ICD-10-CM | POA: Diagnosis present

## 2020-07-13 DIAGNOSIS — M1990 Unspecified osteoarthritis, unspecified site: Secondary | ICD-10-CM | POA: Diagnosis present

## 2020-07-13 DIAGNOSIS — I739 Peripheral vascular disease, unspecified: Secondary | ICD-10-CM | POA: Diagnosis present

## 2020-07-13 DIAGNOSIS — K219 Gastro-esophageal reflux disease without esophagitis: Secondary | ICD-10-CM | POA: Diagnosis present

## 2020-07-13 DIAGNOSIS — Z7189 Other specified counseling: Secondary | ICD-10-CM | POA: Diagnosis not present

## 2020-07-13 DIAGNOSIS — D649 Anemia, unspecified: Secondary | ICD-10-CM

## 2020-07-13 DIAGNOSIS — R29701 NIHSS score 1: Secondary | ICD-10-CM | POA: Diagnosis present

## 2020-07-13 DIAGNOSIS — K5900 Constipation, unspecified: Secondary | ICD-10-CM | POA: Diagnosis not present

## 2020-07-13 DIAGNOSIS — Z20822 Contact with and (suspected) exposure to covid-19: Secondary | ICD-10-CM | POA: Diagnosis present

## 2020-07-13 DIAGNOSIS — I5030 Unspecified diastolic (congestive) heart failure: Secondary | ICD-10-CM | POA: Diagnosis present

## 2020-07-13 DIAGNOSIS — J9601 Acute respiratory failure with hypoxia: Secondary | ICD-10-CM | POA: Diagnosis present

## 2020-07-13 DIAGNOSIS — K922 Gastrointestinal hemorrhage, unspecified: Secondary | ICD-10-CM | POA: Diagnosis not present

## 2020-07-13 DIAGNOSIS — D509 Iron deficiency anemia, unspecified: Secondary | ICD-10-CM | POA: Diagnosis not present

## 2020-07-13 DIAGNOSIS — Z87891 Personal history of nicotine dependence: Secondary | ICD-10-CM | POA: Diagnosis not present

## 2020-07-13 DIAGNOSIS — H5347 Heteronymous bilateral field defects: Secondary | ICD-10-CM | POA: Diagnosis present

## 2020-07-13 LAB — URINALYSIS, COMPLETE (UACMP) WITH MICROSCOPIC
Bacteria, UA: NONE SEEN
Bilirubin Urine: NEGATIVE
Glucose, UA: NEGATIVE mg/dL
Hgb urine dipstick: NEGATIVE
Ketones, ur: NEGATIVE mg/dL
Leukocytes,Ua: NEGATIVE
Nitrite: NEGATIVE
Protein, ur: NEGATIVE mg/dL
Specific Gravity, Urine: 1.018 (ref 1.005–1.030)
pH: 5 (ref 5.0–8.0)

## 2020-07-13 LAB — HEMOGLOBIN AND HEMATOCRIT, BLOOD
HCT: 30.5 % — ABNORMAL LOW (ref 36.0–46.0)
HCT: 29.3 % — ABNORMAL LOW (ref 36.0–46.0)
Hemoglobin: 8.7 g/dL — ABNORMAL LOW (ref 12.0–15.0)
Hemoglobin: 8.6 g/dL — ABNORMAL LOW (ref 12.0–15.0)

## 2020-07-13 LAB — HEMOGLOBIN A1C
Hgb A1c MFr Bld: 5.2 % (ref 4.8–5.6)
Mean Plasma Glucose: 102.54 mg/dL

## 2020-07-13 LAB — LIPID PANEL
Cholesterol: 117 mg/dL (ref 0–200)
HDL: 55 mg/dL (ref >40)
LDL Cholesterol: 47 mg/dL (ref 0–99)
Total CHOL/HDL Ratio: 2.1 RATIO
Triglycerides: 74 mg/dL (ref <150)
VLDL: 15 mg/dL (ref 0–40)

## 2020-07-13 LAB — SARS CORONAVIRUS 2 BY RT PCR (HOSPITAL ORDER, PERFORMED IN ~~LOC~~ HOSPITAL LAB): SARS Coronavirus 2: NEGATIVE

## 2020-07-13 LAB — PREPARE RBC (CROSSMATCH)

## 2020-07-13 MED ORDER — ACETAMINOPHEN 650 MG RE SUPP
650.0000 mg | RECTAL | Status: DC | PRN
  Administered 2020-07-13: 650 mg via RECTAL
  Filled 2020-07-13: qty 1

## 2020-07-13 MED ORDER — SODIUM CHLORIDE 0.9 % IV SOLN
10.0000 mL/h | Freq: Once | INTRAVENOUS | Status: AC
  Administered 2020-07-13: 10 mL/h via INTRAVENOUS

## 2020-07-13 MED ORDER — HYDROCODONE-ACETAMINOPHEN 5-325 MG PO TABS
1.0000 | ORAL_TABLET | Freq: Four times a day (QID) | ORAL | Status: DC | PRN
  Administered 2020-07-14 – 2020-07-23 (×2): 1 via ORAL
  Filled 2020-07-13 (×4): qty 1

## 2020-07-13 MED ORDER — STROKE: EARLY STAGES OF RECOVERY BOOK: Freq: Once | Status: DC

## 2020-07-13 MED ORDER — GADOBUTROL 1 MMOL/ML IV SOLN
7.0000 mL | Freq: Once | INTRAVENOUS | Status: DC | PRN
  Filled 2020-07-13: qty 7.5

## 2020-07-13 MED ORDER — SODIUM CHLORIDE 0.9 % IV SOLN: INTRAVENOUS | Status: DC

## 2020-07-13 MED ORDER — IOHEXOL 350 MG/ML SOLN
100.0000 mL | Freq: Once | INTRAVENOUS | Status: AC | PRN
  Administered 2020-07-13: 100 mL via INTRAVENOUS

## 2020-07-13 MED ORDER — SODIUM CHLORIDE 0.9% FLUSH
10.0000 mL | INTRAVENOUS | Status: DC | PRN
  Administered 2020-07-24: 10 mL

## 2020-07-13 MED ORDER — ACETAMINOPHEN 325 MG PO TABS: 650.0000 mg | ORAL_TABLET | ORAL | Status: DC | PRN

## 2020-07-13 MED ORDER — ARTIFICIAL TEARS OPHTHALMIC OINT
TOPICAL_OINTMENT | Freq: Three times a day (TID) | OPHTHALMIC | Status: DC
  Administered 2020-07-14 – 2020-07-22 (×5): 1 via OPHTHALMIC
  Filled 2020-07-13 (×2): qty 3.5

## 2020-07-13 MED ORDER — ACETAMINOPHEN 160 MG/5ML PO SOLN
650.0000 mg | ORAL | Status: DC | PRN
  Filled 2020-07-13: qty 20.3

## 2020-07-13 NOTE — ED Notes (Signed)
Lab called for blood draw.

## 2020-07-13 NOTE — ED Notes (Signed)
US at bedside

## 2020-07-13 NOTE — ED Notes (Signed)
Pt given sandwich tray. Ate 100%, minimal setup needed. Pt tolerated well.

## 2020-07-13 NOTE — ED Notes (Addendum)
Pt returned from MRI, right upper arm area around midline moderately swollen. IVF held, IV team notified for dc of current midline and new access as pt is difficult stick

## 2020-07-13 NOTE — ED Notes (Signed)
Lab in room for blood draw

## 2020-07-13 NOTE — ED Notes (Signed)
Lab called for blood draw as pt is difficult stick and blood is transfusing through midline

## 2020-07-13 NOTE — Progress Notes (Signed)
SLP Cancellation Note  Patient Details Name: Ashley [name] MRN: [id number] DOB: [birth year]   Cancelled treatment:       Reason Eval/Treat Not Completed: SLP screened, no needs identified, will sign off   Chart reviewed, this writer spoke with pt's nurse, pt and her son. Pt's son states that pt has chronic baseline word finding and memory deficits. At this time, he is not able to identify any acute deficits.   Margret Moat B. Rutherford Nail M.S., CCC-SLP, [place] Office [phone]    Stormy Fabian 07/13/2020, 12:17 PM

## 2020-07-13 NOTE — ED Notes (Signed)
Lab called to do morning draw.

## 2020-07-13 NOTE — H&P (Signed)
History and Physical    Ashley [name] PQZ:[id number] DOB: 11-16-1930 DOA: 07/12/2020  PCP: Albina Billet, MD   Patient coming from: Home  I have personally briefly reviewed patient's old medical records in [place]  Chief Complaint: Altered mental status x2 days  HPI: Ashley [name] is a 86 y.o. female with medical history significant for CAD, COPD, HTN who presents to the emergency room with altered mental status x2 days.  Most of the history is taken from the son who states that he noticed that his mother was lethargic and not her usual self and appeared to not recognize him and his symptoms worsened into today prompting him to bring her into the emergency room.  He states that she had been complaining of weakness for the past 2 weeks which she attributed to her age.  She saw her PCP 3 weeks prior and had blood work done and also recently saw her cardiologist.  He also states that she has been having hip pain and has been seeing the orthopedist and taking Tylenol but no NSAIDs for her pain.  She has had no cough, fever chills or shortness of breath.  No reports of nausea vomiting abdominal pain or change in bowel habits and no dysuria ED Course: On arrival in the emergency room she was mildly tachypneic with otherwise normal vitals.  Blood work notable for hemoglobin of 4.7.  Last hemoglobin on file 10.7 from a year prior.  Stool guaiac positive.  Covid negative chest x-ray with no acute findings.  Head CT showed right occipital lobe hypodensity consistent with acute or subacute infarct.  Blood transfusion started in the ER.  Hospitalist consulted for admission.  Review of Systems: As per HPI otherwise all other systems on review of systems negative.  Unreliable due to mild lethargy   Past Medical History:  Diagnosis Date  . Anemia   . Anxiety   . Arthritis   . COPD (chronic obstructive pulmonary disease) ([place])   . Coronary artery disease   . Edema    feet/ankles   wears support  hose  . GERD (gastroesophageal reflux disease)   . GI (gastrointestinal bleed)    history  . Hypertension   . Myocardial infarction ([place])   . Peripheral vascular disease ([place])   . Shortness of breath dyspnea     Past Surgical History:  Procedure Laterality Date  . ABDOMINAL HYSTERECTOMY    . ABLATION     right leg less than 1 year ago  . CATARACT EXTRACTION W/ INTRAOCULAR LENS  IMPLANT, BILATERAL Bilateral   . CHOLECYSTECTOMY N/A 07/18/2018   Procedure: LAPAROSCOPIC CHOLECYSTECTOMY;  Surgeon: Jules Husbands, MD;  Location: [facility] ORS;  Service: General;  Laterality: N/A;  . CORONARY ANGIOPLASTY     stents  . ENDOSCOPIC RETROGRADE CHOLANGIOPANCREATOGRAPHY (ERCP) WITH PROPOFOL N/A 07/15/2018   Procedure: ENDOSCOPIC RETROGRADE CHOLANGIOPANCREATOGRAPHY (ERCP) WITH PROPOFOL;  Surgeon: [name], MD;  Location: [facility] ENDOSCOPY;  Service: Endoscopy;  Laterality: N/A;  . EYE SURGERY    . FRACTURE SURGERY     right wrist  . TOTAL HIP ARTHROPLASTY Right 02/20/2016   Procedure: TOTAL HIP ARTHROPLASTY ANTERIOR APPROACH;  Surgeon: Hessie Knows, MD;  Location: [facility] ORS;  Service: Orthopedics;  Laterality: Right;     reports that she quit smoking about 23 years ago. She has never used smokeless tobacco. She reports that she does not drink alcohol and does not use drugs.  No Known Allergies  History reviewed. No pertinent family  history.    Prior to Admission medications   Medication Sig Start Date End Date Taking? Authorizing Provider  ALPRAZolam (XANAX) 0.25 MG tablet Take 0.25 mg by mouth 2 (two) times daily.    Yes [provider]  amLODipine (NORVASC) 10 MG tablet Take 10 mg by mouth daily.   Yes [provider]  Ipratropium-Albuterol (COMBIVENT RESPIMAT) 20-100 MCG/ACT AERS respimat Inhale 1 puff into the lungs every 6 (six) hours.   Yes [provider]  isosorbide dinitrate (ISORDIL) 30 MG tablet Take 30 mg by mouth 2 (two) times daily.   Yes [provider]  lisinopril (PRINIVIL,ZESTRIL) 10 MG tablet Take 10 mg by mouth daily.    Yes [provider]  metoprolol succinate (TOPROL-XL) 50 MG 24 hr tablet Take 75 mg by mouth daily. Take with or immediately following a meal.   Yes [provider]  nitroGLYCERIN (NITROSTAT) 0.4 MG SL tablet Place 1 tablet (0.4 mg total) under the tongue every 5 (five) minutes as needed for chest pain. 07/14/18  Yes Fritzi Mandes, MD  simvastatin (ZOCOR) 40 MG tablet Take 40 mg by mouth daily.   Yes [provider]  vitamin B-12 (CYANOCOBALAMIN) 1000 MCG tablet Take 1,000 mcg by mouth daily.   Yes [provider]    Physical Exam: Vitals:   07/12/20 2243 07/13/20 0014 07/13/20 0251 07/13/20 0259  BP:  133/85  133/85  Pulse:  81 89 85  Resp:  (!) 27 (!) 21 (!) 21  Temp:   98.2 F (36.8 C)   TempSrc:   Oral   SpO2:  100% 98% 98%  Weight: 73.5 kg     Height: 4\' 11"  (1.499 m)        Vitals:   07/12/20 2243 07/13/20 0014 07/13/20 0251 07/13/20 0259  BP:  133/85  133/85  Pulse:  81 89 85  Resp:  (!) 27 (!) 21 (!) 21  Temp:   98.2 F (36.8 C)   TempSrc:   Oral   SpO2:  100% 98% 98%  Weight: 73.5 kg     Height: 4\' 11"  (1.499 m)         Constitutional:  Lethargic but arousable and oriented x3. Not in any apparent distress HEENT:      Head: Normocephalic and atraumatic.         Eyes: PERLA, EOMI, Conjunctivae are normal. Sclera is non-icteric.       Mouth/Throat: Mucous membranes are moist but pale.       Neck: Supple with no signs of meningismus. Cardiovascular: Regular rate and rhythm. No murmurs, gallops, or rubs. 2+ symmetrical distal pulses are present . No JVD. No LE edema Respiratory: Respiratory effort normal .Lungs sounds clear bilaterally. No wheezes, crackles, or rhonchi.  Gastrointestinal: Soft, non tender, and non distended with positive bowel sounds. No rebound or guarding. Genitourinary: No CVA tenderness. Musculoskeletal: Nontender with  normal range of motion in all extremities. No cyanosis, or erythema of extremities. Neurologic: Normal speech and language. Face is symmetric. Moving all extremities. No gross focal neurologic deficits . Skin: Skin is warm, dry.  No rash or ulcers Psychiatric: Mood and affect are normal.   Labs on Admission: I have personally reviewed following labs and imaging studies  CBC: Recent Labs  Lab 07/12/20 2242  WBC 5.3  HGB 4.7*  HCT 19.9*  MCV 64.8*  PLT 419   Basic Metabolic Panel: Recent Labs  Lab 07/12/20 2242  NA 137  K 3.4*  CL  105  CO2 25  GLUCOSE 122*  BUN 29*  CREATININE 1.07*  CALCIUM 9.0   GFR: Estimated Creatinine Clearance: 32.9 mL/min (A) (by C-G formula based on SCr of 1.07 mg/dL (H)). Liver Function Tests: Recent Labs  Lab 07/12/20 2242  AST 14*  ALT 12  ALKPHOS 55  BILITOT 0.8  PROT 6.1*  ALBUMIN 3.6   No results for input(s): LIPASE, AMYLASE in the last 168 hours. Recent Labs  Lab 07/12/20 2242  AMMONIA 14   Coagulation Profile: No results for input(s): INR, PROTIME in the last 168 hours. Cardiac Enzymes: No results for input(s): CKTOTAL, CKMB, CKMBINDEX, TROPONINI in the last 168 hours. BNP (last 3 results) No results for input(s): PROBNP in the last 8760 hours. HbA1C: No results for input(s): HGBA1C in the last 72 hours. CBG: No results for input(s): GLUCAP in the last 168 hours. Lipid Profile: No results for input(s): CHOL, HDL, LDLCALC, TRIG, CHOLHDL, LDLDIRECT in the last 72 hours. Thyroid Function Tests: No results for input(s): TSH, T4TOTAL, FREET4, T3FREE, THYROIDAB in the last 72 hours. Anemia Panel: No results for input(s): VITAMINB12, FOLATE, FERRITIN, TIBC, IRON, RETICCTPCT in the last 72 hours. Urine analysis:    Component Value Date/Time   COLORURINE YELLOW (A) 07/13/2020 0009   APPEARANCEUR CLEAR (A) 07/13/2020 0009   LABSPEC 1.018 07/13/2020 0009   PHURINE 5.0 07/13/2020 0009   GLUCOSEU NEGATIVE 07/13/2020 0009    HGBUR NEGATIVE 07/13/2020 0009   BILIRUBINUR NEGATIVE 07/13/2020 0009   KETONESUR NEGATIVE 07/13/2020 0009   PROTEINUR NEGATIVE 07/13/2020 0009   NITRITE NEGATIVE 07/13/2020 0009   LEUKOCYTESUR NEGATIVE 07/13/2020 0009    Radiological Exams on Admission: CT Head Wo Contrast  Result Date: 07/13/2020 CLINICAL DATA:  Altered level of consciousness EXAM: CT HEAD WITHOUT CONTRAST TECHNIQUE: Contiguous axial images were obtained from the base of the skull through the vertex without intravenous contrast. COMPARISON:  None. FINDINGS: Brain: Hypodensity within the right occipital lobe consistent with acute or subacute infarct. Effacement of the overlying sulci and occipital horn of the right lateral ventricle. No evidence of hemorrhagic conversion. Scattered hypodensities elsewhere throughout the periventricular and subcortical white matter likely reflect chronic small vessel ischemic change. No acute hemorrhage. Lateral ventricles and midline structures are otherwise unremarkable. No acute extra-axial fluid collections. No mass effect. Vascular: No hyperdense vessel or unexpected calcification. Skull: Normal. Negative for fracture or focal lesion. Sinuses/Orbits: No acute finding. Other: None. IMPRESSION: 1. Right occipital lobe hypodensity consistent with acute or subacute infarct. 2. Likely chronic small vessel ischemic changes scattered throughout the white matter. 3. No acute hemorrhage. Critical Value/emergent results were called by telephone at the time of interpretation on 07/13/2020 at 3:16 am to provider [facility] , who verbally acknowledged these results. Electronically Signed   By: Randa Ngo M.D.   On: 07/13/2020 03:21   DG Chest Portable 1 View  Result Date: 07/13/2020 CLINICAL DATA:  Dyspnea EXAM: PORTABLE CHEST 1 VIEW COMPARISON:  07/15/2018 FINDINGS: Small left pleural effusion. Moderate cardiomegaly. No overt pulmonary edema or focal airspace consolidation. IMPRESSION: Cardiomegaly  and small left pleural effusion. Electronically Signed   By: Ulyses Jarred M.D.   On: 07/13/2020 00:21    EKG: Independently reviewed. Interpretation : Normal sinus rhythm with no acute ST-T wave changes  Assessment/Plan 86 year old female with history of CAD, COPD, HTN, arthritis who presented with 2-day history of altered mental status preceded by 2-week history of weakness .    Acute/subacute CVA (cerebrovascular accident) ([place]), in the setting of  severe anemia -Patient presents with confusion with right occipital lobe infarct on CT -Symptom onset 48 hours prior to arrival, outside TPA window and not a candidate either way due to positive stool guaiac and severe anemia -Stroke work-up to include carotid Doppler and echocardiogram. -Cardiac monitoring to evaluate for arrhythmias -Neurology consult in the a.m. -MRI/MRA not ordered at this time, pending neurology eval inpatient stabilization  Acute on suspect chronic blood loss anemia   Severe Symptomatic anemia   Acute metabolic encephalopathy -Hemoglobin of 4.7 and patient with 2-week history of weakness and positive stool guaiac.  Hemodynamically stable -According to son, patient takes Tylenol but to his knowledge takes no NSAIDs -Continue transfusion of 2 units packed red blood cells -Suspect altered mental status in part related to anemia -N.p.o. IV hydration between units -Consult GI in the a.m.    Coronary artery disease -Hold aspirin products.  Once BP remains stable continue metoprolol and isosorbide -Continue statin    Benign hypertension -Hold amlodipine and lisinopril as well as metoprolol due to soft diastolic blood pressure 56 on arrival and resume as appropriate    COPD (chronic obstructive pulmonary disease) ([place]) -Not acutely exacerbated -DuoNebs as needed    DVT prophylaxis: SCDs Code Status: DNR. Discussed with son Family Communication:  son, Delanee Xin Disposition Plan: Back to previous home  environment Consults called: none  Status:At the time of admission, it appears that the appropriate admission status for this patient is INPATIENT. This is judged to be reasonable and necessary in order to provide the required intensity of service to ensure the patient's safety given the presenting symptoms, physical exam findings, and initial radiographic and laboratory data in the context of their  Comorbid conditions.   Patient requires inpatient status due to high intensity of service, high risk for further deterioration and high frequency of surveillance required.   I certify that at the point of admission it is my clinical judgment that the patient will require inpatient hospital care spanning beyond [place] MD [facility]     07/13/2020, 3:56 AM

## 2020-07-13 NOTE — ED Notes (Signed)
Pt passed swallow screen, per Dr. Jamesetta Geralds, wait for GI consult, then if no GI procedures, advance diet to cardiac

## 2020-07-13 NOTE — Consult Note (Signed)
NEUROLOGY CONSULTATION NOTE   Date of service: July 13, 2020 Patient Name: Ashley [name] MRN:  [id number] DOB:  1930/11/21 Reason for consult: "Stroke, AMS"  History of Present Illness  Ashley [name] is a 86 y.o. female with PMH significant for CAD, COPD, HTN who presents with altered mental status and generalized weakness.  Per patient, she woke up on 07/12/20 at 6AM with feeling confused and took her 3-4 tries just to get her pill box setup. Something just want right. She went to bed on 07/11/20 at 2100 afte watching a preacher on the TV. She called her son an reports that she had trouble with dialing the phone. Son eventually brought her to the ED. Son endorsed that patient often repeats herself and does have problems with memory for a few years.  He has noticed decline over the last 2 weeks with patient being more confused.  In the ED, patient had work-up with CBC notable for hemoglobin of 4.7, BUN 29, a CT head was obtained that demonstrated a large right occipital lobe hypodensity consistent with an acute to subacute infarct.  She was started on blood transfusion and neurology was consulted for further assistance with the management of the noted right occipital stroke.  Son reports that patient has had prior GI bleed that happened while she was on aspirin and plavix. She was taken off. She had another bleed when she was not on any blood thinners.  Patient endorses bifrontal headache that is dull.   ROS    Constitutional Denies weight loss, fever and chills.  HEENT Denies changes in vision and hearing.  Respiratory Denies SOB and cough.  CV Denies palpitations and CP  GI Denies abdominal pain, nausea, vomiting and diarrhea.  GU Denies dysuria and urinary frequency.  MSK Denies myalgia and joint pain.  Skin Denies rash and pruritus.  Neurological Endorses headache but no syncope.  Psychiatric Denies recent changes in mood. Denies anxiety and depression.     Past History    Past Medical History:  Diagnosis Date  . Anemia   . Anxiety   . Arthritis   . COPD (chronic obstructive pulmonary disease) ([place])   . Coronary artery disease   . Edema    feet/ankles   wears support hose  . GERD (gastroesophageal reflux disease)   . GI (gastrointestinal bleed)    history  . Hypertension   . Myocardial infarction ([place])   . Peripheral vascular disease ([place])   . Shortness of breath dyspnea    Past Surgical History:  Procedure Laterality Date  . ABDOMINAL HYSTERECTOMY    . ABLATION     right leg less than 1 year ago  . CATARACT EXTRACTION W/ INTRAOCULAR LENS  IMPLANT, BILATERAL Bilateral   . CHOLECYSTECTOMY N/A 07/18/2018   Procedure: LAPAROSCOPIC CHOLECYSTECTOMY;  Surgeon: Jules Husbands, MD;  Location: [facility] ORS;  Service: General;  Laterality: N/A;  . CORONARY ANGIOPLASTY     stents  . ENDOSCOPIC RETROGRADE CHOLANGIOPANCREATOGRAPHY (ERCP) WITH PROPOFOL N/A 07/15/2018   Procedure: ENDOSCOPIC RETROGRADE CHOLANGIOPANCREATOGRAPHY (ERCP) WITH PROPOFOL;  Surgeon: [name], MD;  Location: [facility] END[facility]COPY;  Service: Endoscopy;  Laterality: N/A;  . EYE SURGERY    . FRACTURE SURGERY     right wrist  . TOTAL HIP ARTHROPLASTY Right 02/20/2016   Procedure: TOTAL HIP ARTHROPLASTY ANTERIOR APPROACH;  Surgeon: Hessie Knows, MD;  Location: [facility] ORS;  Service: Orthopedics;  Laterality: Right;   History reviewed. No pertinent family history. Social History   Socioeconomic History  .  Marital status: Widowed    Spouse name: Not on file  . Number of children: Not on file  . Years of education: Not on file  . Highest education level: Not on file  Occupational History  . Not on file  Tobacco Use  . Smoking status: Former Smoker    Quit date: 02/11/1997    Years since quitting: 23.4  . Smokeless tobacco: Never Used  Vaping Use  . Vaping Use: Never used  Substance and Sexual Activity  . Alcohol use: No  . Drug use: No  . Sexual activity: Not on file  Other Topics  Concern  . Not on file  Social History Narrative  . Not on file   Social Determinants of Health   Financial Resource Strain:   . Difficulty of Paying Living Expenses: Not on file  Food Insecurity:   . Worried About Charity fundraiser in the Last Year: Not on file  . Ran Out of Food in the Last Year: Not on file  Transportation Needs:   . Lack of Transportation (Medical): Not on file  . Lack of Transportation (Non-Medical): Not on file  Physical Activity:   . Days of Exercise per Week: Not on file  . Minutes of Exercise per Session: Not on file  Stress:   . Feeling of Stress : Not on file  Social Connections:   . Frequency of Communication with Friends and Family: Not on file  . Frequency of Social Gatherings with Friends and Family: Not on file  . Attends Religious Services: Not on file  . Active Member of Clubs or Organizations: Not on file  . Attends Archivist Meetings: Not on file  . Marital Status: Not on file   No Known Allergies  Medications  (Not in a hospital admission)    Vitals  Temp:  [97.9 F (36.6 C)-98.3 F (36.8 C)] 97.9 F (36.6 C) (09/18 0701) Pulse Rate:  [81-89] 87 (09/18 0715) Resp:  [18-28] 22 (09/18 0715) BP: (126-148)/(56-85) 148/72 (09/18 0701) SpO2:  [94 %-100 %] 95 % (09/18 0715) Weight:  [73.5 kg] 73.5 kg (09/17 2243)  Body mass index is 32.72 kg/m.  Physical Exam   General: Laying comfortably in bed; in no acute distress.  HENT: Normal oropharynx and mucosa. Normal external appearance of ears and nose. Neck: Supple, no pain or tenderness CV: No JVD. No peripheral edema. Pulmonary: Symmetric Chest rise. Normal respiratory effort. Abdomen: Soft to touch, non-tender Ext: No cyanosis, edema, or deformity  Skin: No rash. Normal palpation of skin.   Musculoskeletal: Normal digits and nails by inspection. No clubbing.  Neurologic Examination  Mental status/Cognition: Alert, oriented to self, place, month and year, good  attention. Speech/language: Fluent, comprehension intact, object naming intact, repetition intact. Cranial nerves:   CN II Pupils equal and reactive to light, Left visual hemianopsia.   CN III,IV,VI EOM intact, no gaze preference or deviation, no nystagmus   CN V normal sensation in V1, V2, and V3 segments bilaterally   CN VII no asymmetry, no nasolabial fold flattening   CN VIII normal hearing to speech   CN IX & X normal palatal elevation, no uvular deviation   CN XI 5/5 head turn and 5/5 shoulder shrug bilaterally   CN XII midline tongue protrusion   Motor:  Muscle bulk: poor, tone normal Mvmt Root Nerve  Muscle Right Left Comments  SA C5/6 Ax Deltoid 5 5   EF C5/6 Mc Biceps 5 5   EE  C6/7/8 Rad Triceps 5 5   WF C6/7 Med FCR 5 5   WE C7/8 PIN ECU 5 5   F Ab C8/T1 U ADM/FDI 5 5   HF L1/2/3 Fem Illopsoas 3 3 Known R hip replacement and pain limiting eval of hip flexion BL  KE L2/3/4 Fem Quad 3 3 Pain with moving her knees limited knee strength eval  DF L4/5 D Peron Tib Ant 5 5   PF S1/2 Tibial Grc/Sol 5 5    Reflexes:  Right Left Comments  Pectoralis      Biceps (C5/6) 1 1   Brachioradialis (C5/6) 1 1    Triceps (C6/7) 1 1    Patellar (L3/4) 0 0    Achilles (S1) 0 0    Hoffman      Plantar down down   Jaw jerk    Sensation:  Light touch Intact BL   Pin prick    Temperature    Vibration   Proprioception    Coordination/Complex Motor:  - Finger to Nose intact BL - Heel to shin unable to assess due to weakness and pain in hip - Rapid alternating movement are slowed. - Gait: Deferred.  Labs   Lab Results  Component Value Date   NA 137 07/12/2020   K 3.4 (L) 07/12/2020   CL 105 07/12/2020   CO2 25 07/12/2020   GLUCOSE 122 (H) 07/12/2020   BUN 29 (H) 07/12/2020   CREATININE 1.07 (H) 07/12/2020   CALCIUM 9.0 07/12/2020   ALBUMIN 3.6 07/12/2020   AST 14 (L) 07/12/2020   ALT 12 07/12/2020   ALKPHOS 55 07/12/2020   BILITOT 0.8 07/12/2020   GFRNONAA 47 (L)  07/12/2020   GFRAA 54 (L) 07/12/2020     Imaging and Diagnostic studies  CTH without contrast: IMPRESSION: 1. Right occipital lobe hypodensity consistent with acute or subacute infarct. 2. Likely chronic small vessel ischemic changes scattered throughout the white matter. 3. No acute hemorrhage.  Lab Results  Component Value Date   LDLCALC 47 07/13/2020   Lab Results  Component Value Date   HGBA1C 5.2 07/13/2020     Impression   ZAKIA SAINATO is a 86 y.o. female with PMH significant for CAD, COPD, HTN who presents with altered mental status and generalized weakness and found to have GI bleed and a large R occipital stroke. Her neurologic examination is notable for L hemianopsia. Will get workup to evaluate for a cause for her infarct.  Given her current GI bleed in the setting of no antiplatelet and her prior hx of GI bleed on antiplatelet, it is reasonable to hold off on antiplatelet agnet. If there is an underlying treatable cause for her GI bleed and it is eliminated or treated, would recommend starting her on aspirin, otherwise, okay to hold off on aspirin.  Recommendations  - Brain imaging- MRI Brain pending - Vascular imaging- MRA Head and Neck pending - TTE - pending - Lipid panel - with LDL of 52. - Statin - continue Simvastatin 40mg  daily - A1C - 5.2 - Antithrombotic - okay to not start given GI bleed. If the underlying cause for her I bleed is identified and treated, can start Aspirin 81mg  daily. - DVT ppx - SCDs - SBP goal - permissive hypertension first 24 h < 220/110. Held home meds.  - Telemetry monitoring for arrythmia - Swallow screen - Stroke education - PT/OT/SLP consults - Can do one time headache cocktail id needed with Tylenol PO 650mg , Magnesium IV  1g once, compazine 10mg  PO once and Benadryl 25mg  PO or IV once.  ______________________________________________________________________   Thank you for the opportunity to take part in the care of  this patient. If you have any further questions, please contact the neurology consultation attending.  Signed,  [place] Pager Number [phone]

## 2020-07-13 NOTE — ED Notes (Signed)
Per MD draw h and H one hour after blood transfusion finishes. Order placed for H and H, lab notified.

## 2020-07-13 NOTE — Progress Notes (Signed)
Physical Therapy Evaluation Patient Details Name: Ashley [name] MRN: [id number] DOB: 14-Mar-1931 Today's Date: 07/13/2020   History of Present Illness  Per MD note:Ashley [name] is a 86 y.o. female with medical history significant for CAD, COPD, HTN who presents to the emergency room with altered mental status x2 days.  Most of the history is taken from the son who states that he noticed that his mother was lethargic and not her usual self and appeared to not recognize him and his symptoms worsened into today prompting him to bring her into the emergency room.  He states that she had been complaining of weakness for the past 2 weeks which she attributed to her age.  She saw her PCP 3 weeks prior and had blood work done and also recently saw her cardiologist.  He also states that she has been having hip pain and has been seeing the orthopedist and taking Tylenol but no NSAIDs for her pain.  She has had no cough, fever chills or shortness of breath.  No reports of nausea vomiting abdominal pain or change in bowel habits and no dysuria  Clinical Impression  Patient agrees to PT evaluation. Pt reports no pain. Pt lives alone with a 2 entry into home. Pt ambulated with RW prior to this hospital admission. Pt has -3/5 strength BLE hip and knee and is mod for bed mobility. Pt has fair static sitting balance with flexed posture. Patient has increased RR of 45 with sitting EOB. Pt will continue to benefit from skilled PT to improve mobility and strength.    Follow Up Recommendations SNF    Equipment Recommendations  None recommended by PT    Recommendations for Other Services       Precautions / Restrictions Restrictions Weight Bearing Restrictions: No      Mobility  Bed Mobility Overal bed mobility: Needs Assistance Bed Mobility: Supine to Sit;Sit to Supine     Supine to sit: Mod assist Sit to supine: Mod assist   General bed mobility comments:  (RR increased to 45)  Transfers                     Ambulation/Gait                Stairs            Wheelchair Mobility    Modified Rankin (Stroke Patients Only)       Balance Overall balance assessment: Needs assistance Sitting-balance support: Bilateral upper extremity supported;Feet supported Sitting balance-Leahy Scale: Poor   Postural control: Other (comment) (flexed)                                   Pertinent Vitals/Pain Pain Assessment: No/denies pain    Home Living Family/patient expects to be discharged to:: Unsure                      Prior Function Level of Independence: Independent with assistive device(s)               Hand Dominance        Extremity/Trunk Assessment   Upper Extremity Assessment Upper Extremity Assessment: Overall WFL for tasks assessed    Lower Extremity Assessment Lower Extremity Assessment: Generalized weakness       Communication   Communication: No difficulties  Cognition Arousal/Alertness: Awake/alert Behavior During Therapy: WFL for tasks assessed/performed Overall Cognitive Status:  Within Functional Limits for tasks assessed                                        General Comments      Exercises     Assessment/Plan    PT Assessment Patient needs continued PT services  PT Problem List Decreased strength;Decreased activity tolerance;Decreased balance;Decreased mobility;Decreased safety awareness       PT Treatment Interventions Gait training;Therapeutic activities;Therapeutic exercise;Balance training    PT Goals (Current goals can be found in the Care Plan section)  Acute Rehab PT Goals Patient Stated Goal: to walk PT Goal Formulation: Patient unable to participate in goal setting Time For Goal Achievement: 07/27/20 Potential to Achieve Goals: Fair    Frequency Min 2X/week   Barriers to discharge Decreased caregiver support      Co-evaluation               AM-PAC PT  "6 Clicks" Mobility  Outcome Measure Help needed turning from your back to your side while in a flat bed without using bedrails?: A Lot Help needed moving from lying on your back to sitting on the side of a flat bed without using bedrails?: A Lot Help needed moving to and from a bed to a chair (including a wheelchair)?: Total Help needed standing up from a chair using your arms (e.g., wheelchair or bedside chair)?: Total Help needed to walk in hospital room?: Total Help needed climbing 3-5 steps with a railing? : Total 6 Click Score: 8    End of Session Equipment Utilized During Treatment: Gait belt;Oxygen Activity Tolerance: Patient limited by fatigue;Patient limited by lethargy Patient left: in bed Nurse Communication: Mobility status PT Visit Diagnosis: Muscle weakness (generalized) (M62.81);Difficulty in walking, not elsewhere classified (R26.2)    Time: 1700-1749 PT Time Calculation (min) (ACUTE ONLY): 25 min   Charges:   PT Evaluation $PT Eval Low Complexity: 1 Low PT Treatments $Therapeutic Activity: 8-22 mins          Alanson Puls, PT DPT 07/13/2020, 4:48 PM

## 2020-07-13 NOTE — ED Notes (Signed)
Patient transported to MRI 

## 2020-07-13 NOTE — ED Notes (Signed)
Pt given PR tylenol for c/o headache.

## 2020-07-13 NOTE — ED Notes (Signed)
Report from [place], rn.

## 2020-07-13 NOTE — Consult Note (Addendum)
Ashley Lame, MD [facility]  [address] [place], [place] [address] Phone: [phone] Fax : [phone]  Consultation  Referring Provider:     Dr. Damita Dunnings Primary Care Physician:  Albina Billet, MD Primary Gastroenterologist: Althia Forts         Reason for Consultation:     Anemia  Date of Admission:  07/12/2020 Date of Consultation:  07/13/2020         HPI:   Ashley [name] is a 86 y.o. female who was admitted with altered mental status for the last 2 days and reported a headache. The patient had a CT scan of the head showing a right occipital lobe hypodensity consistent with acute or subacute infarct. On the work-up the patient was found to have a hemoglobin of 4.7 with the patient's last hemoglobin on file of 10.7 from a year ago. The patient had stool sent off that showed her to be heme positive. The patient is not able to give much history and most of the history is gotten by the son. The patient does report that between 4 to 6 months ago she did have black stools but has not had any black stools recently. She also states that her close have been paid on her recently indicating that she may have lost some weight. The patient denies any antiinflammatory medication. There is no report of any black stools or bloody stools. She also denies any dysphagia or hematemesis. In addition to the acute symptoms she also reports that she has been weak for the last 2 weeks.  Past Medical History:  Diagnosis Date  . Anemia   . Anxiety   . Arthritis   . COPD (chronic obstructive pulmonary disease) ([place])   . Coronary artery disease   . Edema    feet/ankles   wears support hose  . GERD (gastroesophageal reflux disease)   . GI (gastrointestinal bleed)    history  . Hypertension   . Myocardial infarction ([place])   . Peripheral vascular disease ([place])   . Shortness of breath dyspnea     Past Surgical History:  Procedure Laterality Date  . ABDOMINAL HYSTERECTOMY    . ABLATION     right leg  less than 1 year ago  . CATARACT EXTRACTION W/ INTRAOCULAR LENS  IMPLANT, BILATERAL Bilateral   . CHOLECYSTECTOMY N/A 07/18/2018   Procedure: LAPAROSCOPIC CHOLECYSTECTOMY;  Surgeon: Jules Husbands, MD;  Location: [facility] ORS;  Service: General;  Laterality: N/A;  . CORONARY ANGIOPLASTY     stents  . ENDOSCOPIC RETROGRADE CHOLANGIOPANCREATOGRAPHY (ERCP) WITH PROPOFOL N/A 07/15/2018   Procedure: ENDOSCOPIC RETROGRADE CHOLANGIOPANCREATOGRAPHY (ERCP) WITH PROPOFOL;  Surgeon: Ashley Lame, MD;  Location: [facility] ENDOSCOPY;  Service: Endoscopy;  Laterality: N/A;  . EYE SURGERY    . FRACTURE SURGERY     right wrist  . TOTAL HIP ARTHROPLASTY Right 02/20/2016   Procedure: TOTAL HIP ARTHROPLASTY ANTERIOR APPROACH;  Surgeon: Hessie Knows, MD;  Location: [facility] ORS;  Service: Orthopedics;  Laterality: Right;    Prior to Admission medications   Medication Sig Start Date End Date Taking? Authorizing Provider  ALPRAZolam (XANAX) 0.25 MG tablet Take 0.25 mg by mouth 2 (two) times daily.    Yes [provider]  amLODipine (NORVASC) 10 MG tablet Take 10 mg by mouth daily.   Yes [provider]  Ipratropium-Albuterol (COMBIVENT RESPIMAT) 20-100 MCG/ACT AERS respimat Inhale 1 puff into the lungs every 6 (six) hours.   Yes [provider]  isosorbide dinitrate (ISORDIL)  30 MG tablet Take 30 mg by mouth 2 (two) times daily.   Yes [provider]  lisinopril (PRINIVIL,ZESTRIL) 10 MG tablet Take 10 mg by mouth daily.    Yes [provider]  metoprolol succinate (TOPROL-XL) 50 MG 24 hr tablet Take 75 mg by mouth daily. Take with or immediately following a meal.   Yes [provider]  nitroGLYCERIN (NITROSTAT) 0.4 MG SL tablet Place 1 tablet (0.4 mg total) under the tongue every 5 (five) minutes as needed for chest pain. 07/14/18  Yes Fritzi Mandes, MD  simvastatin (ZOCOR) 40 MG tablet Take 40 mg by mouth daily.   Yes [provider]  vitamin B-12 (CYANOCOBALAMIN) 1000  MCG tablet Take 1,000 mcg by mouth daily.   Yes [provider]    History reviewed. No pertinent family history.   Social History   Tobacco Use  . Smoking status: Former Smoker    Quit date: 02/11/1997    Years since quitting: 23.4  . Smokeless tobacco: Never Used  Vaping Use  . Vaping Use: Never used  Substance Use Topics  . Alcohol use: No  . Drug use: No    Allergies as of 07/12/2020  . (No Known Allergies)    Review of Systems:    All systems reviewed and negative except where noted in HPI.   Physical Exam:  Vital signs in last 24 hours: Temp:  [97.9 F (36.6 C)-98.3 F (36.8 C)] 97.9 F (36.6 C) (09/18 0924) Pulse Rate:  [81-92] 85 (09/18 1300) Resp:  [17-28] 23 (09/18 1300) BP: (126-148)/(56-85) 142/61 (09/18 1300) SpO2:  [92 %-100 %] 99 % (09/18 1300) Weight:  [73.5 kg] 73.5 kg (09/17 2243)   General:   Pleasant, cooperative in NAD Head:  Normocephalic and atraumatic. Eyes:   No icterus.   Conjunctiva pink. PERRLA. Ears:  Normal auditory acuity. Neck:  Supple; no masses or thyroidomegaly Lungs: Respirations even and unlabored. Lungs clear to auscultation bilaterally.   No wheezes, crackles, or rhonchi.  Heart:  Regular rate and rhythm;  Without murmur, clicks, rubs or gallops Abdomen:  Soft, nondistended, nontender. Normal bowel sounds. No appreciable masses or hepatomegaly.  No rebound or guarding.  Rectal:  Not performed. Msk:  Symmetrical without gross deformities.  S  Extremities:  Without edema, cyanosis or clubbing. Neurologic:  Alert and oriented x3;  Skin:  Intact without significant lesions or rashes. Cervical Nodes:  No significant cervical adenopathy. Psych:  Alert and cooperative. Normal affect.  LAB RESULTS: Recent Labs    07/12/20 2242 07/13/20 1121  WBC 5.3  --   HGB 4.7* 8.7*  HCT 19.9* 30.5*  PLT 240  --    BMET Recent Labs    07/12/20 2242  NA 137  K 3.4*  CL 105  CO2 25  GLUCOSE 122*  BUN 29*  CREATININE  1.07*  CALCIUM 9.0   LFT Recent Labs    07/12/20 2242  PROT 6.1*  ALBUMIN 3.6  AST 14*  ALT 12  ALKPHOS 55  BILITOT 0.8   PT/INR No results for input(s): LABPROT, INR in the last 72 hours.  STUDIES: CT Head Wo Contrast  Result Date: 07/13/2020 CLINICAL DATA:  Altered level of consciousness EXAM: CT HEAD WITHOUT CONTRAST TECHNIQUE: Contiguous axial images were obtained from the base of the skull through the vertex without intravenous contrast. COMPARISON:  None. FINDINGS: Brain: Hypodensity within the right occipital lobe consistent with acute or subacute infarct. Effacement of the overlying sulci and occipital horn  of the right lateral ventricle. No evidence of hemorrhagic conversion. Scattered hypodensities elsewhere throughout the periventricular and subcortical white matter likely reflect chronic small vessel ischemic change. No acute hemorrhage. Lateral ventricles and midline structures are otherwise unremarkable. No acute extra-axial fluid collections. No mass effect. Vascular: No hyperdense vessel or unexpected calcification. Skull: Normal. Negative for fracture or focal lesion. Sinuses/Orbits: No acute finding. Other: None. IMPRESSION: 1. Right occipital lobe hypodensity consistent with acute or subacute infarct. 2. Likely chronic small vessel ischemic changes scattered throughout the white matter. 3. No acute hemorrhage. Critical Value/emergent results were called by telephone at the time of interpretation on 07/13/2020 at 3:16 am to provider [facility] , who verbally acknowledged these results. Electronically Signed   By: Randa Ngo M.D.   On: 07/13/2020 03:21   US Carotid Bilateral (at [facility] and [facility] only)  Result Date: 07/13/2020 CLINICAL DATA:  Stroke. History of previous myocardial infarction and hypertension. EXAM: BILATERAL CAROTID DUPLEX ULTRASOUND TECHNIQUE: Pearline Cables scale imaging, color Doppler and duplex ultrasound were performed of bilateral carotid and vertebral arteries  in the neck. COMPARISON:  None. FINDINGS: Criteria: Quantification of carotid stenosis is based on velocity parameters that correlate the residual internal carotid diameter with NASCET-based stenosis levels, using the diameter of the distal internal carotid lumen as the denominator for stenosis measurement. The following velocity measurements were obtained: RIGHT ICA: 267/55 cm/sec CCA: 13/08 cm/sec SYSTOLIC ICA/CCA RATIO:  3.6 ECA: 126 cm/sec LEFT ICA: 177/39 cm/sec CCA: 65/78 cm/sec SYSTOLIC ICA/CCA RATIO:  2.8 ECA: 102 cm/sec RIGHT CAROTID ARTERY: There is a minimal amount of intimal thickening involving the right carotid bulb (image 21). There is a large amount predominantly hypoechoic plaque involving the right internal carotid artery resulting in elevated peak systolic velocities throughout the interrogated course of the right internal carotid artery. Greatest acquired peak systolic velocity within the mid right ICA measures 268 centimeters/second (image 38). RIGHT VERTEBRAL ARTERY:  Antegrade Flow LEFT CAROTID ARTERY: There is a minimal amount intimal thickening/atherosclerotic plaque involving the left carotid bulb. There is a large amount of echogenic irregular atherosclerotic plaque throughout the left common carotid artery, resulting in elevated peak systolic velocities within the proximal and mid aspects of the left internal carotid artery. Greatest acquired peak systolic velocity within the mid left ICA measures 177 centimeters/second (image 79). LEFT VERTEBRAL ARTERY:  Antegrade flow IMPRESSION: 1. Large amount of right-sided atherosclerotic plaque results in elevated peak systolic velocities within the right internal carotid artery compatible with the greater than 70% luminal narrowing range. Further evaluation with CTA could be performed as clinically indicated. 2. Large amount of left-sided atherosclerotic plaque results in elevated peak systolic velocities within the left internal carotid artery  compatible with the higher end of the 50-69% luminal narrowing range. 3. Antegrade flow demonstrated within the bilateral vertebral arteries. Electronically Signed   By: Sandi Mariscal M.D.   On: 07/13/2020 08:38   DG Chest Portable 1 View  Result Date: 07/13/2020 CLINICAL DATA:  Dyspnea EXAM: PORTABLE CHEST 1 VIEW COMPARISON:  07/15/2018 FINDINGS: Small left pleural effusion. Moderate cardiomegaly. No overt pulmonary edema or focal airspace consolidation. IMPRESSION: Cardiomegaly and small left pleural effusion. Electronically Signed   By: Ulyses Jarred M.D.   On: 07/13/2020 00:21   CT Angio Abd/Pel w/ and/or w/o  Result Date: 07/13/2020 CLINICAL DATA:  Bilateral lower extremity edema.  GI bleed. EXAM: CTA ABDOMEN AND PELVIS WITHOUT AND WITH CONTRAST TECHNIQUE: Multidetector CT imaging of the abdomen and pelvis was performed using the standard  protocol during bolus administration of intravenous contrast. Multiplanar reconstructed images and MIPs were obtained and reviewed to evaluate the vascular anatomy. CONTRAST:  182mL OMNIPAQUE IOHEXOL 350 MG/ML SOLN, 140mL OMNIPAQUE IOHEXOL 350 MG/ML SOLN COMPARISON:  07/15/2018 FINDINGS: VASCULAR Aorta: Aneurysmal dilatation of the infrarenal abdominal aorta measures 3.1 cm, image 57/4. Aortic atherosclerosis. No signs of dissection, vasculitis or stenosis. Celiac: Calcified plaque noted at the origin of the celiac trunk. No significant stenosis identified. SMA: Calcified plaque identified within the proximal superior mesenteric artery with approximately 60% stenosis. Renals: Mild narrowing at the origin of the left renal artery with less than 15% stenosis. Calcification at the origin of the right renal artery is noted with approximately 15% stenosis. IMA: Patent without evidence of aneurysm, dissection, vasculitis or significant stenosis. Inflow: Patent without evidence of aneurysm, dissection, vasculitis or significant stenosis. Proximal Outflow: Bilateral common  femoral and visualized portions of the superficial and profunda femoral arteries are patent without evidence of aneurysm, dissection, vasculitis or significant stenosis. Veins: No obvious venous abnormality within the limitations of this arterial phase study. Review of the MIP images confirms the above findings. NON-VASCULAR Lower chest: Small bilateral pleural effusions with overlying compressive type atelectasis. Hepatobiliary: Low-density structure within posteroinferior right lobe of liver measures 9 mm and is too small to characterize. No suspicious liver abnormality. Previous cholecystectomy. Mild increase caliber of the common bile duct is noted which measures up to 9 mm compatible with post cholecystectomy physiology. Pancreas: Unremarkable. No pancreatic ductal dilatation or surrounding inflammatory changes. Spleen: Normal in size without focal abnormality. Adrenals/Urinary Tract: Normal appearance of the adrenal glands. Bilateral kidney cysts are identified. The largest arises from the lateral cortex of the right mid kidney measuring 5.2 cm. No signs of hydronephrosis bilaterally. Urinary bladder is unremarkable. Stomach/Bowel: Moderate to large hiatal hernia. There is no bowel wall thickening, inflammation or distension. No signs of contrast extravasation within the bowel loops to indicate site of GI bleed. Extensive sigmoid diverticulosis. No acute inflammation. Lymphatic: No abdominopelvic adenopathy. Reproductive: Status post hysterectomy. No adnexal masses. Other: No free fluid or fluid collections identified. There is a periumbilical hernia which contains fat and a loop of nonobstructive transverse colon. Musculoskeletal: No acute or significant osseous findings. IMPRESSION: 1. No acute findings within the abdomen or pelvis. No signs of contrast extravasation within the bowel loops to indicate site of GI bleed. 2. Aortic atherosclerosis. There is a 3.1 cm infrarenal abdominal aortic aneurysm.  Recommend follow-up every 3 years. this recommendation follows ACR consensus guidelines: White Paper of the ACR Incidental Findings Committee II on Vascular Findings. J Am Coll Radiol 2013; 10:789-794. 3. Bilateral kidney cysts. 4. Hiatal hernia. 5. Small bilateral pleural effusions with overlying compressive type atelectasis. 6. Periumbilical hernia contains fat and a loop of nonobstructive transverse colon. Aortic Atherosclerosis (ICD10-I70.0). Aortic aneurysm NOS (ICD10-I71.9). Electronically Signed   By: Kerby Moors M.D.   On: 07/13/2020 06:57      Impression / Plan:   Assessment: Principal Problem:   Acute CVA (cerebrovascular accident) ([place]) Active Problems:   Coronary artery disease   Benign hypertension   Acute on chronic blood loss anemia   Severe Symptomatic anemia   Acute metabolic encephalopathy   COPD (chronic obstructive pulmonary disease) ([facility])   Ashley [name] is a 86 y.o. y/o female with an acute versus subacute CVA who had blood work showing a hemoglobin of 4.7. The patient has a hemoglobin of 10.7 as of most recent labs from a year prior. I'm now being  consulted for this patient with symptomatic anemia and a CVA. The patient denies any overt sign of GI bleeding such as black stools or bloody stools. She also denies any recent endoscopic procedures.  Plan:  This patient may have had anemia developing over a long time since her MCV is also low. The does not appear to be any acute GI bleeding and more likely chronic iron deficiency anemia. The patient is at a risk for any endoscopic procedures due to her acute versus subacute occipital infarct. I do agree with transfusing the patient with blood and giving iron supplementation and can consider endoscopic evaluation after the stroke has resolved or if the patient should start bleeding profusely requiring an emergency procedure. The patient would be at high risk for any anticoagulation at this time but will leave that decision  up to neurology. The patient and her son have been explained the plan and agree with it.  PPI daily  Continue serial CBCs and transfuse PRN Avoid NSAIDs Maintain 2 large-bore IV lines Please page GI with any acute hemodynamic changes, or signs of active GI bleeding   Thank you for involving me in the care of this patient.      LOS: 0 days   Ashley Lame, MD, [facility] 07/13/2020, 1:17 PM,  Pager [phone] 7am-5pm  Check AMION for 5pm -7am coverage and on weekends   Note: This dictation was prepared with Dragon dictation along with smaller phrase technology. Any transcriptional errors that result from this process are unintentional.

## 2020-07-13 NOTE — ED Notes (Signed)
Per Dr.Shahmehdi, next hgb and hct at 2000

## 2020-07-13 NOTE — Progress Notes (Addendum)
PROGRESS NOTE    Patient: Ashley [name]                            PCP: Ashley Billet, MD                    DOB: [birth year]            DOA: 07/12/2020 PPJ:[id number]             DOS: 07/13/2020, 7:42 AM   LOS: 0 days   Date of Service: The patient was seen and examined on 07/13/2020  Subjective:   The patient was seen and examined this Am. Much more awake alert, answering all questions appropriately.  Accompanied by son at the bedside. Despite some confusion, or difficulty with word finding no new focal neurological findings   Brief Narrative:   Per HPI:  Ashley [name] is a 86 y.o. f w PMH/o   CAD, COPD, HTN who presents to the emergency room with  with 2-day history of altered mental status preceded by 2-week history of weakness   On presentation patient was lethargic, somnolent unable to provide history History is provided by son with complaint of 2 weeks weaknesses progressive lethargic. -Has seen PCP and cardiology in the past 3 weeks.  No medication changes.  Denies excessive NSAID use. This morning patient reported she was taken off aspirin Plavix due to previous history of anemia questionable GI bleed in the past.   ED Course:  On admission was lethargic, confused tachypneic, BP was stable  Blood work notable for hemoglobin of 4.7.  Last hemoglobin on file 10.7 from a year prior.  Stool guaiac positive.   SARS-CoV-2 negative -chest x-ray with no acute findings.   -Head CT showed right occipital lobe hypodensity consistent with acute or subacute infarct.   Blood transfusion started in the ER.      Assessment & Plan:   Principal Problem:   Acute CVA (cerebrovascular accident) [facility]) Active Problems:   Coronary artery disease   Benign hypertension   Acute on chronic blood loss anemia   Severe Symptomatic anemia   Acute metabolic encephalopathy   COPD (chronic obstructive pulmonary disease) (HCC)    Acute/subacute CVA (cerebrovascular accident) ([place]), in the  setting of severe anemia -Much improved mental status this a.m., no obvious asymmetric weaknesses, subjectively patient reports difficulty with word finding--difficulty with memory short and long-term -Son present at bedside confirms current findings, no history of stroke in the past  -Patient presents with confusion with right occipital lobe infarct on CT -Symptom onset 48 hours prior to arrival, outside TPA window and not a candidate either way due to positive stool guaiac and severe anemia -Stroke work-up to include carotid Doppler  -Echocardiogram:  -Cardiac monitoring to evaluate for arrhythmias -Neurology consulted ... Appreciate further evaluation input -MRI/MRA not ordered at this time, pending neurology eval inpatient stabilization -Anticipating once patient stable from the GI standpoint restarted on aspirin   Acute on suspect chronic Blood loss anemia/ Severe Symptomatic anemia -Status post 2 units of PRBC.  Pending repeat H&H anticipating another unit of transfusion -On admission hemoglobin 4.7, guaiac positive,--previously hemoglobin was >10.0 at baseline -Currently hemodynamically stable  -GI consulted, appreciate further evaluation and input -Anticipating once cleared by GI to be restarted at least on aspirin due to CVA -According to son, patient takes Tylenol but to his knowledge takes no NSAIDs  -N.p.o. IV hydration between units  Acute metabolic encephalopathy -Back to baseline -Likely due to anemia, versus stroke -We will continue treating underlying causing anemia and CVA -Much improved, following commands, alert oriented x3 -Continue neurochecks      Coronary artery disease -Denies any chest pain -Hold aspirin products.   -Once BP remains stable continue metoprolol and isosorbide -Continue statin    Benign hypertension -Remained stable, patient is out of the window for permissive hypertension Nevertheless will allow her BP at this time, Blood  pressures running low due to severe anemia -Withholding amlodipine, lisinopril, metoprolol      COPD (chronic obstructive pulmonary disease) (HCC) -No signs of exacerbation, stable, saturating 2% on room air -Supplement fever as needed -DuoNebs as needed      Nutritional status:        Cultures; Blood Cultures x 2 >> NGT Urine Culture  >>> NGT   Antimicrobials:     Consultants: GI and Neurology    ------------------------------------------------------------------------------------------------------------------------------------------------  DVT prophylaxis:  SCD/Compression stockings Code Status:   Code Status: DNR Family Communication: son Mr. Ashley [name] -present at bedside, updated  The above findings and plan of care has been discussed son in detail,  they expressed understanding and agreement of above. -Advance care planning has been discussed.   Admission status:    Status is: Inpatient  Remains inpatient appropriate because:Inpatient level of care appropriate due to severity of illness   Dispo: The patient is from: Home              Anticipated d/c is to: SNF              Anticipated d/c date is: 3 days              Patient currently is not medically stable to d/c.        Procedures:   No admission procedures for hospital encounter.     Antimicrobials:  Anti-infectives (From admission, onward)   None       Medication:  .  stroke: mapping our early stages of recovery book   Does not apply Once    acetaminophen **OR** acetaminophen (TYLENOL) oral liquid 160 mg/5 mL **OR** acetaminophen, sodium chloride flush   Objective:   Vitals:   07/13/20 0646 07/13/20 0700 07/13/20 0701 07/13/20 0715  BP: (!) 142/60 (!) 148/72 (!) 148/72   Pulse: 87 87 88 87  Resp: (!) 24 (!) 21 18 (!) 22  Temp: 98.1 F (36.7 C)  97.9 F (36.6 C)   TempSrc: Oral  Oral   SpO2: 95% 97%  95%  Weight:      Height:        Intake/Output Summary  (Last 24 hours) at 07/13/2020 1962 Last data filed at 07/13/2020 0646 Gross per 24 hour  Intake 800 ml  Output --  Net 800 ml   Filed Weights   07/12/20 2243  Weight: 73.5 kg     Examination:   Physical Exam  Constitution: Mildly confused, difficulty word finding, otherwise awake alert in no acute distress Psychiatric: Normal and stable mood and affect, cognition intact,   HEENT: Normocephalic, PERRL, otherwise with in Normal limits  Chest:Chest symmetric Cardio vascular:  S1/S2, RRR, No murmure, No Rubs or Gallops  pulmonary: Clear to auscultation bilaterally, respirations unlabored, negative wheezes / crackles Abdomen: Soft, non-tender, non-distended, bowel sounds,no masses, no organomegaly Muscular skeletal:  Generalized weaknesses, otherwise Limited exam - in bed, able to move all 4 extremities, Normal strength,  Neuro: Speech intact, following command, CNII-XII intact. ,  normal motor and sensation, reflexes intact  Extremities: No pitting edema lower extremities, +2 pulses  Skin: Dry, warm to touch, negative for any Rashes, No open wounds Wounds: per nursing documentation    ------------------------------------------------------------------------------------------------------------------------------------------    LABs:  CBC Latest Ref Rng & Units 07/12/2020 07/19/2018 07/15/2018  WBC 4.0 - 10.5 K/uL 5.3 12.1(H) -  Hemoglobin 12.0 - 15.0 g/dL 4.7(LL) 10.7(L) 11.9(L)  Hematocrit 36 - 46 % 19.9(L) 33.1(L) 36.8  Platelets 150 - 400 K/uL 240 110(L) -   CMP Latest Ref Rng & Units 07/12/2020 07/19/2018 07/18/2018  Glucose 70 - 99 mg/dL 122(H) 116(H) -  BUN 8 - 23 mg/dL 29(H) 24(H) -  Creatinine 0.44 - 1.00 mg/dL 1.07(H) 0.59 -  Sodium 135 - 145 mmol/L 137 136 -  Potassium 3.5 - 5.1 mmol/L 3.4(L) 4.2 -  Chloride 98 - 111 mmol/L 105 105 -  CO2 22 - 32 mmol/L 25 26 -  Calcium 8.9 - 10.3 mg/dL 9.0 8.1(L) -  Total Protein 6.5 - 8.1 g/dL 6.1(L) 5.0(L) 5.2(L)  Total Bilirubin 0.3 -  1.2 mg/dL 0.8 0.9 1.0  Alkaline Phos 38 - 126 U/L 55 110 128(H)  AST 15 - 41 U/L 14(L) 40 20  ALT 0 - 44 U/L 12 103(H) 123(H)       Micro Results Recent Results (from the past 240 hour(s))  SARS Coronavirus 2 by RT PCR (hospital order, performed in [facility] hospital lab) Nasopharyngeal Nasopharyngeal Swab     Status: None   Collection Time: 07/13/20 12:09 AM   Specimen: Nasopharyngeal Swab  Result Value Ref Range Status   SARS Coronavirus 2 NEGATIVE NEGATIVE Final    Comment: (NOTE) SARS-CoV-2 target nucleic acids are NOT DETECTED.  The SARS-CoV-2 RNA is generally detectable in upper and lower respiratory specimens during the acute phase of infection. The lowest concentration of SARS-CoV-2 viral copies this assay can detect is 250 copies / mL. A negative result does not preclude SARS-CoV-2 infection and should not be used as the sole basis for treatment or other patient management decisions.  A negative result may occur with improper specimen collection / handling, submission of specimen other than nasopharyngeal swab, presence of viral mutation(s) within the areas targeted by this assay, and inadequate number of viral copies (<250 copies / mL). A negative result must be combined with clinical observations, patient history, and epidemiological information.  Fact Sheet for Patients:   StrictlyIdeas.no  Fact Sheet for Healthcare Providers: BankingDealers.co.za  This test is not yet approved or  cleared by the [place] FDA and has been authorized for detection and/or diagnosis of SARS-CoV-2 by FDA under an Emergency Use Authorization (EUA).  This EUA will remain in effect (meaning this test can be used) for the duration of the COVID-19 declaration under Section 564(b)(1) of the Act, 21 U.S.C. section 360bbb-3(b)(1), unless the authorization is terminated or revoked sooner.  Performed at [facility], [address] [place], [place] [address]     Radiology Reports CT Head Wo Contrast  Result Date: 07/13/2020 CLINICAL DATA:  Altered level of consciousness EXAM: CT HEAD WITHOUT CONTRAST TECHNIQUE: Contiguous axial images were obtained from the base of the skull through the vertex without intravenous contrast. COMPARISON:  None. FINDINGS: Brain: Hypodensity within the right occipital lobe consistent with acute or subacute infarct. Effacement of the overlying sulci and occipital horn of the right lateral ventricle. No evidence of hemorrhagic conversion. Scattered hypodensities elsewhere throughout the periventricular and subcortical white matter likely reflect chronic  small vessel ischemic change. No acute hemorrhage. Lateral ventricles and midline structures are otherwise unremarkable. No acute extra-axial fluid collections. No mass effect. Vascular: No hyperdense vessel or unexpected calcification. Skull: Normal. Negative for fracture or focal lesion. Sinuses/Orbits: No acute finding. Other: None. IMPRESSION: 1. Right occipital lobe hypodensity consistent with acute or subacute infarct. 2. Likely chronic small vessel ischemic changes scattered throughout the white matter. 3. No acute hemorrhage. Critical Value/emergent results were called by telephone at the time of interpretation on 07/13/2020 at 3:16 am to provider [facility] , who verbally acknowledged these results. Electronically Signed   By: Randa Ngo M.D.   On: 07/13/2020 03:21   DG Chest Portable 1 View  Result Date: 07/13/2020 CLINICAL DATA:  Dyspnea EXAM: PORTABLE CHEST 1 VIEW COMPARISON:  07/15/2018 FINDINGS: Small left pleural effusion. Moderate cardiomegaly. No overt pulmonary edema or focal airspace consolidation. IMPRESSION: Cardiomegaly and small left pleural effusion. Electronically Signed   By: Ulyses Jarred M.D.   On: 07/13/2020 00:21   CT Angio Abd/Pel w/ and/or w/o  Result Date: 07/13/2020 CLINICAL DATA:  Bilateral lower extremity  edema.  GI bleed. EXAM: CTA ABDOMEN AND PELVIS WITHOUT AND WITH CONTRAST TECHNIQUE: Multidetector CT imaging of the abdomen and pelvis was performed using the standard protocol during bolus administration of intravenous contrast. Multiplanar reconstructed images and MIPs were obtained and reviewed to evaluate the vascular anatomy. CONTRAST:  143mL OMNIPAQUE IOHEXOL 350 MG/ML SOLN, 118mL OMNIPAQUE IOHEXOL 350 MG/ML SOLN COMPARISON:  07/15/2018 FINDINGS: VASCULAR Aorta: Aneurysmal dilatation of the infrarenal abdominal aorta measures 3.1 cm, image 57/4. Aortic atherosclerosis. No signs of dissection, vasculitis or stenosis. Celiac: Calcified plaque noted at the origin of the celiac trunk. No significant stenosis identified. SMA: Calcified plaque identified within the proximal superior mesenteric artery with approximately 60% stenosis. Renals: Mild narrowing at the origin of the left renal artery with less than 15% stenosis. Calcification at the origin of the right renal artery is noted with approximately 15% stenosis. IMA: Patent without evidence of aneurysm, dissection, vasculitis or significant stenosis. Inflow: Patent without evidence of aneurysm, dissection, vasculitis or significant stenosis. Proximal Outflow: Bilateral common femoral and visualized portions of the superficial and profunda femoral arteries are patent without evidence of aneurysm, dissection, vasculitis or significant stenosis. Veins: No obvious venous abnormality within the limitations of this arterial phase study. Review of the MIP images confirms the above findings. NON-VASCULAR Lower chest: Small bilateral pleural effusions with overlying compressive type atelectasis. Hepatobiliary: Low-density structure within posteroinferior right lobe of liver measures 9 mm and is too small to characterize. No suspicious liver abnormality. Previous cholecystectomy. Mild increase caliber of the common bile duct is noted which measures up to 9 mm compatible  with post cholecystectomy physiology. Pancreas: Unremarkable. No pancreatic ductal dilatation or surrounding inflammatory changes. Spleen: Normal in size without focal abnormality. Adrenals/Urinary Tract: Normal appearance of the adrenal glands. Bilateral kidney cysts are identified. The largest arises from the lateral cortex of the right mid kidney measuring 5.2 cm. No signs of hydronephrosis bilaterally. Urinary bladder is unremarkable. Stomach/Bowel: Moderate to large hiatal hernia. There is no bowel wall thickening, inflammation or distension. No signs of contrast extravasation within the bowel loops to indicate site of GI bleed. Extensive sigmoid diverticulosis. No acute inflammation. Lymphatic: No abdominopelvic adenopathy. Reproductive: Status post hysterectomy. No adnexal masses. Other: No free fluid or fluid collections identified. There is a periumbilical hernia which contains fat and a loop of nonobstructive transverse colon. Musculoskeletal: No acute or significant osseous findings. IMPRESSION:  1. No acute findings within the abdomen or pelvis. No signs of contrast extravasation within the bowel loops to indicate site of GI bleed. 2. Aortic atherosclerosis. There is a 3.1 cm infrarenal abdominal aortic aneurysm. Recommend follow-up every 3 years. this recommendation follows ACR consensus guidelines: White Paper of the ACR Incidental Findings Committee II on Vascular Findings. J Am Coll Radiol 2013; 10:789-794. 3. Bilateral kidney cysts. 4. Hiatal hernia. 5. Small bilateral pleural effusions with overlying compressive type atelectasis. 6. Periumbilical hernia contains fat and a loop of nonobstructive transverse colon. Aortic Atherosclerosis (ICD10-I70.0). Aortic aneurysm NOS (ICD10-I71.9). Electronically Signed   By: Kerby Moors M.D.   On: 07/13/2020 06:57    SIGNED: Deatra James, MD, FACP, FHM. [facility],  Pager (please use [email or web address] to page/text)  If 7PM-7AM, please contact  night-coverage [email or web address] Ota [facility] 07/13/2020, 7:42 AM

## 2020-07-13 NOTE — ED Notes (Signed)
Pt moved to hospital bed. Fresh sheets and bed pad placed. Pt states she is comfortable. Pt tolerated moving in the bed well.

## 2020-07-13 NOTE — ED Notes (Signed)
Pt back from CT at this time 

## 2020-07-13 NOTE — Progress Notes (Signed)
OT Cancellation Note  Patient Details Name: Ashley [name] MRN: [id number] DOB: October 21, 1931   Cancelled Treatment:    Reason Eval/Treat Not Completed: Patient not medically ready. Chart reviewed - pt noted to have Hgb critically low at 4.7; contraindicated for exertional activity at this time. Will continue to follow and initiate services as pt medically appropriate to participate in therapy.   Dessie Coma, M.S. OTR/L  07/13/20, 7:28 AM  ascom [phone]

## 2020-07-14 ENCOUNTER — Inpatient Hospital Stay: Payer: Medicare Other

## 2020-07-14 ENCOUNTER — Encounter: Payer: Self-pay | Admitting: Family Medicine

## 2020-07-14 DIAGNOSIS — I639 Cerebral infarction, unspecified: Secondary | ICD-10-CM | POA: Diagnosis present

## 2020-07-14 LAB — TYPE AND SCREEN
ABO/RH(D): B POS
Antibody Screen: NEGATIVE
Unit division: 0
Unit division: 0

## 2020-07-14 LAB — CBC
HCT: 28.2 % — ABNORMAL LOW (ref 36.0–46.0)
Hemoglobin: 8.1 g/dL — ABNORMAL LOW (ref 12.0–15.0)
MCH: 19.9 pg — ABNORMAL LOW (ref 26.0–34.0)
MCHC: 28.7 g/dL — ABNORMAL LOW (ref 30.0–36.0)
MCV: 69.3 fL — ABNORMAL LOW (ref 80.0–100.0)
Platelets: 173 10*3/uL (ref 150–400)
RBC: 4.07 MIL/uL (ref 3.87–5.11)
RDW: 23 % — ABNORMAL HIGH (ref 11.5–15.5)
WBC: 5.9 10*3/uL (ref 4.0–10.5)
nRBC: 1 % — ABNORMAL HIGH (ref 0.0–0.2)

## 2020-07-14 LAB — IRON AND TIBC
Iron: 14 ug/dL — ABNORMAL LOW (ref 28–170)
Saturation Ratios: 4 % — ABNORMAL LOW (ref 10.4–31.8)
TIBC: 405 ug/dL (ref 250–450)
UIBC: 391 ug/dL

## 2020-07-14 LAB — BPAM RBC
Blood Product Expiration Date: 202110132359
Blood Product Expiration Date: 202110132359
ISSUE DATE / TIME: 202109180216
ISSUE DATE / TIME: 202109180638
Unit Type and Rh: 5100
Unit Type and Rh: 5100

## 2020-07-14 LAB — BASIC METABOLIC PANEL
Anion gap: 5 (ref 5–15)
BUN: 14 mg/dL (ref 8–23)
CO2: 26 mmol/L (ref 22–32)
Calcium: 8 mg/dL — ABNORMAL LOW (ref 8.9–10.3)
Chloride: 109 mmol/L (ref 98–111)
Creatinine, Ser: 0.59 mg/dL (ref 0.44–1.00)
GFR calc Af Amer: >60 mL/min (ref >60)
GFR calc non Af Amer: >60 mL/min (ref >60)
Glucose, Bld: 96 mg/dL (ref 70–99)
Potassium: 3.4 mmol/L — ABNORMAL LOW (ref 3.5–5.1)
Sodium: 140 mmol/L (ref 135–145)

## 2020-07-14 LAB — ECHOCARDIOGRAM COMPLETE
Area-P 1/2: 7.09 cm2
Height: 59 in
S' Lateral: 3.11 cm
Weight: 2592 oz

## 2020-07-14 MED ORDER — IOHEXOL 350 MG/ML SOLN
75.0000 mL | Freq: Once | INTRAVENOUS | Status: AC | PRN
  Administered 2020-07-14: 75 mL via INTRAVENOUS
  Filled 2020-07-14: qty 75

## 2020-07-14 MED ORDER — DOCUSATE SODIUM 100 MG PO CAPS
100.0000 mg | ORAL_CAPSULE | Freq: Two times a day (BID) | ORAL | Status: DC
  Administered 2020-07-14 – 2020-07-17 (×7): 100 mg via ORAL
  Filled 2020-07-14 (×6): qty 1

## 2020-07-14 MED ORDER — ISOSORBIDE DINITRATE 30 MG PO TABS
30.0000 mg | ORAL_TABLET | Freq: Two times a day (BID) | ORAL | Status: DC
  Administered 2020-07-14: 30 mg via ORAL
  Filled 2020-07-14 (×2): qty 1

## 2020-07-14 MED ORDER — FERROUS SULFATE 325 (65 FE) MG PO TABS
325.0000 mg | ORAL_TABLET | Freq: Two times a day (BID) | ORAL | Status: DC
  Administered 2020-07-14 – 2020-07-17 (×6): 325 mg via ORAL
  Filled 2020-07-14 (×7): qty 1

## 2020-07-14 MED ORDER — ALPRAZOLAM 0.25 MG PO TABS
0.2500 mg | ORAL_TABLET | Freq: Three times a day (TID) | ORAL | Status: DC | PRN
  Administered 2020-07-14 – 2020-07-24 (×9): 0.25 mg via ORAL
  Filled 2020-07-14 (×8): qty 1

## 2020-07-14 NOTE — ED Notes (Signed)
Pt called out using call bell. Pt states she needs to pee. Pt reminded of external catheter in place. Pt given ice water per request. Denies other needs at this time.

## 2020-07-14 NOTE — Progress Notes (Signed)
PROGRESS NOTE    Patient: [name]                            PCP: Albina Billet, MD                    DOB: 1931-10-02            DOA: 07/12/2020 VOJ:[id number]             DOS: 07/14/2020, 10:58 AM   LOS: 1 day   Date of Service: The patient was seen and examined on 07/14/2020  Subjective:   The patient was seen and examined this morning, stable, no acute distress, no issues overnight. No new focal neurological findings Hemodynamically stable Mental status back to baseline   Son present at bedside    Brief Narrative:   Per HPI:  Ashley [name] is a 86 y.o. f w PMH/o   CAD, COPD, HTN who presents to the emergency room with  with 2-day history of altered mental status preceded by 2-week history of weakness   On presentation patient was lethargic, somnolent unable to provide history History is provided by son with complaint of 2 weeks weaknesses progressive lethargic. -Has seen PCP and cardiology in the past 3 weeks.  No medication changes.  Denies excessive NSAID use. This morning patient reported she was taken off aspirin Plavix due to previous history of anemia questionable GI bleed in the past.   ED Course:  On admission was lethargic, confused tachypneic, BP was stable  Blood work notable for hemoglobin of 4.7.  Last hemoglobin on file 10.7 from a year prior.  Stool guaiac positive.   SARS-CoV-2 negative -chest x-ray with no acute findings.   -Head CT showed right occipital lobe hypodensity consistent with acute or subacute infarct.   Blood transfusion started in the ER.      Assessment & Plan:   Principal Problem:   Acute CVA (cerebrovascular accident) [name]) Active Problems:   Coronary artery disease   Benign hypertension   Acute on chronic blood loss anemia   Severe Symptomatic anemia   Acute metabolic encephalopathy   COPD (chronic obstructive pulmonary disease) (HCC)    Acute/subacute CVA (cerebrovascular accident) ([place]), in the setting of severe  anemia -Mental status back to baseline -Mild difficulty with word finding otherwise stable no new focal neurological findings   -Patient presents with confusion with right occipital lobe infarct on CT -Symptom onset 48 hours prior to arrival, outside TPA window and not a candidate either way due to positive stool guaiac and severe anemia -Stroke work-up to include carotid Doppler  -Echocardiogram:  -Cardiac monitoring to evaluate for arrhythmias -Neurology consulted ... Appreciate further evaluation input -MRI/MRA -consistent with aneurysms-right PICA right PCA ischemic changes Left and right ICA aneurysm largest 10 mm -Bilateral carotid study, right 70% stenosis, left 60-65% stenosis  -Appreciate neurology recommendations -Anticipating once patient stable from the GI standpoint restarted on aspirin   Acute on suspect chronic Blood loss anemia/ Severe Symptomatic anemia -Status post 2 units of PRBC.  Pending repeat H&H anticipating another unit of transfusion -On admission hemoglobin 4.7 ---2U PRBC transfused >> 8.1  guaiac positive,--previously hemoglobin was >10.0 at baseline -Hemodynamically stable -GI consulted -believe the patient might help chronic iron deficiency, possible multiple chronic microbleed GNR recommending upper lower endoscopy at this time.    Acute metabolic encephalopathy -Back to baseline alert oriented x3 -Likely due to anemia,  versus stroke -We will continue treating underlying causing anemia and CVA -Continue neurochecks      Coronary artery disease -Stable denies any chest pain -Hold aspirin products.   -Once BP remains stable continue metoprolol and isosorbide -Continue statin    Benign hypertension -Remained stable Nevertheless will allow her BP at this time, Blood pressures running low due to severe anemia -Withholding amlodipine, lisinopril, metoprolol      COPD (chronic obstructive pulmonary disease) (HCC) -No signs of  exacerbation -Supplement fever as needed -DuoNebs as needed      Nutritional status:        Cultures; Blood Cultures x 2 >> NGT Urine Culture  >>> NGT   Antimicrobials:     Consultants: GI and Neurology    ---------------------------------------------------------------------------------------------------------------------------------------------  DVT prophylaxis:  SCD/Compression stockings Code Status:   Code Status: DNR Family Communication: son Mr. Nashley Cordoba -present at bedside, updated  The above findings and plan of care has been discussed son in detail,  they expressed understanding and agreement of above. -Advance care planning has been discussed.   Admission status:    Status is: Inpatient  Remains inpatient appropriate because:Inpatient level of care appropriate due to severity of illness   Dispo: The patient is from: Home              Anticipated d/c is to: SNF              Anticipated d/c date is: 3 days              Patient currently is not medically stable to d/c.        Procedures:   No admission procedures for [name] encounter.     Antimicrobials:  Anti-infectives (From admission, onward)   None       Medication:  .  stroke: mapping our early stages of recovery book   Does not apply Once  . artificial tears   Both Eyes Q8H  . docusate sodium  100 mg Oral BID  . ferrous sulfate  325 mg Oral BID WC    acetaminophen **OR** acetaminophen (TYLENOL) oral liquid 160 mg/5 mL **OR** acetaminophen, gadobutrol, HYDROcodone-acetaminophen, sodium chloride flush   Objective:   Vitals:   07/14/20 0730 07/14/20 0800 07/14/20 0830 07/14/20 0900  BP: (!) 143/68 (!) 144/64 (!) 141/63 (!) 146/64  Pulse: 88 83 83 87  Resp: (!) 24 (!) 23 (!) 25 (!) 21  Temp:      TempSrc:      SpO2: 100% 100% 100% 100%  Weight:      Height:        Intake/Output Summary (Last 24 hours) at 07/14/2020 1058 Last data filed at 07/14/2020 0606 Gross  per 24 hour  Intake 1050 ml  Output --  Net 1050 ml   Filed Weights   07/12/20 2243  Weight: 73.5 kg     Examination:      Physical Exam:   General:  Alert, oriented, cooperative, no distress; ---  HEENT:  Normocephalic, PERRL, otherwise with in Normal limits   Neuro:   Speech intact, mild difficulty with word finding, CNII-XII intact. , normal motor and sensation, reflexes intact   Lungs:   Clear to auscultation BL, Respirations unlabored, no wheezes / crackles  Cardio:    S1/S2, RRR, No murmure, No Rubs or Gallops   Abdomen:   Soft, non-tender, bowel sounds active all four quadrants,  no guarding or peritoneal signs.  Muscular skeletal:  Limited exam - in bed, able  to move all 4 extremities, Normal strength,  2+ pulses,  symmetric, No pitting edema  Skin:  Dry, warm to touch, negative for any Rashes, No open wounds  Wounds: Please see nursing documentation            ------------------------------------------------------------------------------------------------------------------------------------------    LABs:  CBC Latest Ref Rng & Units 07/14/2020 07/13/2020 07/13/2020  WBC 4.0 - 10.5 K/uL 5.9 - -  Hemoglobin 12.0 - 15.0 g/dL 8.1(L) 8.6(L) 8.7(L)  Hematocrit 36 - 46 % 28.2(L) 29.3(L) 30.5(L)  Platelets 150 - 400 K/uL 173 - -   CMP Latest Ref Rng & Units 07/14/2020 07/12/2020 07/19/2018  Glucose 70 - 99 mg/dL 96 122(H) 116(H)  BUN 8 - 23 mg/dL 14 29(H) 24(H)  Creatinine 0.44 - 1.00 mg/dL 0.59 1.07(H) 0.59  Sodium 135 - 145 mmol/L 140 137 136  Potassium 3.5 - 5.1 mmol/L 3.4(L) 3.4(L) 4.2  Chloride 98 - 111 mmol/L 109 105 105  CO2 22 - 32 mmol/L 26 25 26   Calcium 8.9 - 10.3 mg/dL 8.0(L) 9.0 8.1(L)  Total Protein 6.5 - 8.1 g/dL - 6.1(L) 5.0(L)  Total Bilirubin 0.3 - 1.2 mg/dL - 0.8 0.9  Alkaline Phos 38 - 126 U/L - 55 110  AST 15 - 41 U/L - 14(L) 40  ALT 0 - 44 U/L - 12 103(H)       Micro Results Recent Results (from the past 240 hour(s))  SARS Coronavirus  2 by RT PCR ([name] order, performed in [name]) Nasopharyngeal Nasopharyngeal Swab     Status: None   Collection Time: 07/13/20 12:09 AM   Specimen: Nasopharyngeal Swab  Result Value Ref Range Status   SARS Coronavirus 2 NEGATIVE NEGATIVE Final    Comment: (NOTE) SARS-CoV-2 target nucleic acids are NOT DETECTED.  The SARS-CoV-2 RNA is generally detectable in upper and lower respiratory specimens during the acute phase of infection. The lowest concentration of SARS-CoV-2 viral copies this assay can detect is 250 copies / mL. A negative result does not preclude SARS-CoV-2 infection and should not be used as the sole basis for treatment or other patient management decisions.  A negative result may occur with improper specimen collection / handling, submission of specimen other than nasopharyngeal swab, presence of viral mutation(s) within the areas targeted by this assay, and inadequate number of viral copies (<250 copies / mL). A negative result must be combined with clinical observations, patient history, and epidemiological information.  Fact Sheet for Patients:   StrictlyIdeas.no  Fact Sheet for Healthcare Providers: BankingDealers.co.za  This test is not yet approved or  cleared by the [place] FDA and has been authorized for detection and/or diagnosis of SARS-CoV-2 by FDA under an Emergency Use Authorization (EUA).  This EUA will remain in effect (meaning this test can be used) for the duration of the COVID-19 declaration under Section 564(b)(1) of the Act, 21 U.S.C. section 360bbb-3(b)(1), unless the authorization is terminated or revoked sooner.  Performed at [name], [address], [place], [place] [address]     Radiology Reports CT Head Wo Contrast  Result Date: 07/13/2020 CLINICAL DATA:  Altered level of consciousness EXAM: CT HEAD WITHOUT CONTRAST TECHNIQUE: Contiguous axial  images were obtained from the base of the skull through the vertex without intravenous contrast. COMPARISON:  None. FINDINGS: Brain: Hypodensity within the right occipital lobe consistent with acute or subacute infarct. Effacement of the overlying sulci and occipital horn of the right lateral ventricle. No evidence of hemorrhagic conversion. Scattered hypodensities  elsewhere throughout the periventricular and subcortical white matter likely reflect chronic small vessel ischemic change. No acute hemorrhage. Lateral ventricles and midline structures are otherwise unremarkable. No acute extra-axial fluid collections. No mass effect. Vascular: No hyperdense vessel or unexpected calcification. Skull: Normal. Negative for fracture or focal lesion. Sinuses/Orbits: No acute finding. Other: None. IMPRESSION: 1. Right occipital lobe hypodensity consistent with acute or subacute infarct. 2. Likely chronic small vessel ischemic changes scattered throughout the white matter. 3. No acute hemorrhage. Critical Value/emergent results were called by telephone at the time of interpretation on 07/13/2020 at 3:16 am to provider [place] [name] , who verbally acknowledged these results. Electronically Signed   By: Randa Ngo M.D.   On: 07/13/2020 03:21   MR ANGIO HEAD WO CONTRAST  Result Date: 07/13/2020 CLINICAL DATA:  86 year old female with altered mental status. Increasing confusion x3 days. Acute to subacute appearing right PCA territory infarct on plain head CT earlier today. History of GI bleeding, and substantially low hemoglobin and hematocrit levels on presentation. Being transfused. EXAM: MRA HEAD WITHOUT CONTRAST TECHNIQUE: Angiographic images of the Circle of Willis were obtained using MRA technique without intravenous contrast. COMPARISON:  Neck MRA today reported separately. FINDINGS: Antegrade flow in the posterior circulation with dominant distal right vertebral artery. No distal vertebral stenosis. Patent left  PICA origin. The right AICA appears to be dominant. No right PICA identified. Patent vertebrobasilar junction. Patent basilar artery without stenosis. Patent SCA and PCA origins, and bilateral PCA branches appear symmetric and within normal limits. Antegrade flow in both ICA siphons. Both cavernous ICA segments appear abnormal, with a large roughly 10 mm saccular versus fusiform aneurysm of the cavernous right ICA directed medially. And there is a much smaller 3 mm saccular aneurysm of the cavernous left ICA directed superiorly and medially. See series 1, images 96 and 100. furthermore, there is evidence of a second small distal left ICA aneurysm, in the form of a 3-4 mm aneurysm directed medially from the left anterior genu (intradural). But no superimposed ICA stenosis is identified in both carotid termini are patent. Ophthalmic artery origins appear within normal limits. Posterior communicating arteries are diminutive or absent. Patent MCA and ACA origins. Anterior communicating artery and visible ACA branches are within normal limits. MCA M1 segments and bifurcations are patent without evidence of stenosis. Visible bilateral MCA branches are within normal limits. IMPRESSION: 1. Positive for multiple ICA aneurysms: - large, roughly 10 mm aneurysm of the cavernous Right ICA directed medially. - 4 mm Left ICA anterior genu aneurysm (intradural). - 3 mm cavernous Left ICA aneurysm. 2. Negative for large vessel occlusion or intracranial stenosis, the Right PCA is within normal limits. 3. Considering both the intracranial and extracranial MRA findings, a follow-up CTA Head and Neck with IV contrast is recommended when feasible. Electronically Signed   By: Genevie Ann M.D.   On: 07/13/2020 18:18   MR ANGIO NECK WO CONTRAST  Result Date: 07/13/2020 CLINICAL DATA:  86 year old female with altered mental status. Increasing confusion x3 days. Acute to subacute appearing right PCA territory infarct on plain head CT earlier  today. History of GI bleeding, and substantially low hemoglobin and hematocrit levels on presentation. Being transfused. A study without and with contrast was planned but the patient's IV access infiltrated. EXAM: MRA NECK WITHOUT CONTRAST TECHNIQUE: Angiographic images of the neck were obtained using MRA technique without intravenous contrast. Carotid stenosis measurements (when applicable) are obtained utilizing NASCET criteria, using the distal internal carotid diameter as the denominator.  COMPARISON:  Brain MRI today. Carotid Doppler ultrasound earlier today. FINDINGS: Time-of-flight MRA images demonstrate antegrade flow in both cervical carotid and vertebral arteries to the skull base. Dominant appearing right vertebral artery, no evidence of vertebral artery stenosis in the visible V1 or V2 segments. Both carotid bifurcations also are patent, but there are no flow gaps to suggest a high-grade cervical ICA stenosis. However, there is irregularity and narrowing of the proximal right ICA (series 6, image 73), roughly estimated at 50 % with respect to the distal vessel on these images. Mild left ICA distal bulb irregularity without that degree of stenosis. IMPRESSION: Evidence of right greater than left ICA atherosclerosis, but time-of-flight MRA does NOT confirm a high-grade carotid stenosis in the neck. Follow-up CTA neck with IV contrast would best evaluate further as necessary. Electronically Signed   By: Genevie Ann M.D.   On: 07/13/2020 18:11   MR BRAIN WO CONTRAST  Result Date: 07/13/2020 CLINICAL DATA:  86 year old female with altered mental status. Increasing confusion x3 days. Acute to subacute appearing right PCA territory infarct on plain head CT earlier today. History of GI bleeding, and substantially low hemoglobin and hematocrit levels on presentation. Being transfused. EXAM: MRI HEAD WITHOUT CONTRAST TECHNIQUE: Multiplanar, multiecho pulse sequences of the brain and surrounding structures were  obtained without intravenous contrast. COMPARISON:  Head CT 0224 hours today. FINDINGS: Brain: Confluent restricted diffusion throughout the right occipital lobe, posterior right temporal lobe including the right hippocampal formation. T2 and FLAIR hyperintense cytotoxic edema in the affected areas. Minimal T1 hypointensity. The right thalamus is spared. No acute intracranial hemorrhage identified. No significant intracranial mass effect. Additionally there is a small focus of patchy restricted diffusion in the right inferior cerebellum, PICA territory (series 9, image 10) with minimal T2 and FLAIR hyperintensity. No mass effect. No other restricted diffusion. Widely scattered moderate for age bilateral cerebral white matter T2 and FLAIR hyperintensity. But no chronic cortical encephalomalacia or chronic cerebral blood products are identified. Mild T2 heterogeneity in the deep gray nuclei, primarily the left thalamus. No midline shift, mass effect, evidence of mass lesion, ventriculomegaly, extra-axial collection or acute intracranial hemorrhage. Cervicomedullary junction and pituitary are within normal limits. Vascular: Major intracranial vascular flow voids are preserved. Mild intracranial artery tortuosity. Skull and upper cervical spine: Negative for age visible cervical spine. Mildly heterogeneous background bone marrow signal, also mildly abnormal on DWI. Sinuses/Orbits: Negative. Other: Scalp and face appear negative. IMPRESSION: 1. Confluent acute Right PCA territory infarct. Subtle acute infarct also in the cerebellum, Right PICA territory. No associated hemorrhage or mass effect. 2. Moderate for age chronic white matter signal changes likely due to small vessel disease. 3. Heterogeneous bone marrow signal likely reflects red marrow reactivation in the setting of anemia. Electronically Signed   By: Genevie Ann M.D.   On: 07/13/2020 18:06   US Carotid Bilateral (at [facility] and [facility] only)  Result Date:  07/13/2020 CLINICAL DATA:  Stroke. History of previous myocardial infarction and hypertension. EXAM: BILATERAL CAROTID DUPLEX ULTRASOUND TECHNIQUE: Pearline Cables scale imaging, color Doppler and duplex ultrasound were performed of bilateral carotid and vertebral arteries in the neck. COMPARISON:  None. FINDINGS: Criteria: Quantification of carotid stenosis is based on velocity parameters that correlate the residual internal carotid diameter with NASCET-based stenosis levels, using the diameter of the distal internal carotid lumen as the denominator for stenosis measurement. The following velocity measurements were obtained: RIGHT ICA: 267/55 cm/sec CCA: 11/94 cm/sec SYSTOLIC ICA/CCA RATIO:  3.6 ECA: 126 cm/sec LEFT ICA:  177/39 cm/sec CCA: 16/10 cm/sec SYSTOLIC ICA/CCA RATIO:  2.8 ECA: 102 cm/sec RIGHT CAROTID ARTERY: There is a minimal amount of intimal thickening involving the right carotid bulb (image 21). There is a large amount predominantly hypoechoic plaque involving the right internal carotid artery resulting in elevated peak systolic velocities throughout the interrogated course of the right internal carotid artery. Greatest acquired peak systolic velocity within the mid right ICA measures 268 centimeters/second (image 38). RIGHT VERTEBRAL ARTERY:  Antegrade Flow LEFT CAROTID ARTERY: There is a minimal amount intimal thickening/atherosclerotic plaque involving the left carotid bulb. There is a large amount of echogenic irregular atherosclerotic plaque throughout the left common carotid artery, resulting in elevated peak systolic velocities within the proximal and mid aspects of the left internal carotid artery. Greatest acquired peak systolic velocity within the mid left ICA measures 177 centimeters/second (image 79). LEFT VERTEBRAL ARTERY:  Antegrade flow IMPRESSION: 1. Large amount of right-sided atherosclerotic plaque results in elevated peak systolic velocities within the right internal carotid artery compatible  with the greater than 70% luminal narrowing range. Further evaluation with CTA could be performed as clinically indicated. 2. Large amount of left-sided atherosclerotic plaque results in elevated peak systolic velocities within the left internal carotid artery compatible with the higher end of the 50-69% luminal narrowing range. 3. Antegrade flow demonstrated within the bilateral vertebral arteries. Electronically Signed   By: Sandi Mariscal M.D.   On: 07/13/2020 08:38   DG Chest Portable 1 View  Result Date: 07/13/2020 CLINICAL DATA:  Dyspnea EXAM: PORTABLE CHEST 1 VIEW COMPARISON:  07/15/2018 FINDINGS: Small left pleural effusion. Moderate cardiomegaly. No overt pulmonary edema or focal airspace consolidation. IMPRESSION: Cardiomegaly and small left pleural effusion. Electronically Signed   By: Ulyses Jarred M.D.   On: 07/13/2020 00:21   ECHOCARDIOGRAM COMPLETE  Result Date: 07/14/2020    ECHOCARDIOGRAM REPORT   Patient Name:   STEPHANNIE BRONER Date of Exam: 07/13/2020 Medical Rec #:  [id number]      Height:       59.0 in Accession #:    [id number]     Weight:       162.0 lb Date of Birth:  08/09/1931      BSA:          1.686 m Patient Age:    61 years       BP:           148/72 mmHg Patient Gender: F              HR:           109 bpm. Exam Location:  [facility] Procedure: 2D Echo Indications:     SYNCOPE 780.2/R55  History:         Patient has prior history of Echocardiogram examinations, most                  recent 07/13/2018. CAD, Stroke; Risk Factors:Hypertension.  Sonographer:     Avanell Shackleton Referring Phys:  [id number] Athena Masse Diagnosing Phys: Bartholome Bill MD IMPRESSIONS  1. Left ventricular ejection fraction, by estimation, is 55 to 60%. The left ventricle has normal function. The left ventricle has no regional wall motion abnormalities. Left ventricular diastolic parameters are consistent with Grade I diastolic dysfunction (impaired relaxation).  2. Right ventricular systolic function is normal. The  right ventricular size is normal.  3. The mitral valve is grossly normal. Mild mitral valve regurgitation.  4. The aortic valve is grossly normal. Aortic valve regurgitation  is not visualized. FINDINGS  Left Ventricle: Left ventricular ejection fraction, by estimation, is 55 to 60%. The left ventricle has normal function. The left ventricle has no regional wall motion abnormalities. The left ventricular internal cavity size was normal in size. There is  borderline left ventricular hypertrophy. Left ventricular diastolic parameters are consistent with Grade I diastolic dysfunction (impaired relaxation). Right Ventricle: The right ventricular size is normal. No increase in right ventricular wall thickness. Right ventricular systolic function is normal. Left Atrium: Left atrial size was normal in size. Right Atrium: Right atrial size was normal in size. Pericardium: There is no evidence of pericardial effusion. Mitral Valve: The mitral valve is grossly normal. Mild mitral valve regurgitation. Tricuspid Valve: The tricuspid valve is grossly normal. Tricuspid valve regurgitation is mild. Aortic Valve: The aortic valve is grossly normal. Aortic valve regurgitation is not visualized. Pulmonic Valve: The pulmonic valve was not well visualized. Pulmonic valve regurgitation is trivial. Aorta: The aortic root is normal in size and structure. IAS/Shunts: The interatrial septum was not assessed.  LEFT VENTRICLE PLAX 2D LVIDd:         5.07 cm  Diastology LVIDs:         3.11 cm  LV e' medial:    7.72 cm/s LV PW:         1.04 cm  LV E/e' medial:  16.5 LV IVS:        1.02 cm  LV e' lateral:   7.07 cm/s LVOT diam:     2.10 cm  LV E/e' lateral: 18.0 LVOT Area:     3.46 cm  RIGHT VENTRICLE         IVC TAPSE (M-mode): 3.0 cm  IVC diam: 2.02 cm LEFT ATRIUM             Index       RIGHT ATRIUM           Index LA diam:        3.80 cm 2.25 cm/m  RA Area:     18.00 cm LA Vol (A2C):   68.4 ml 40.56 ml/m RA Volume:   46.20 ml  27.40 ml/m  LA Vol (A4C):   74.9 ml 44.42 ml/m LA Biplane Vol: 74.5 ml 44.18 ml/m   AORTA Ao Root diam: 3.20 cm MITRAL VALVE                TRICUSPID VALVE MV Area (PHT): 7.09 cm     TR Peak grad:   63.7 mmHg MV Decel Time: 107 msec     TR Vmax:        399.00 cm/s MV E velocity: 127.00 cm/s MV A velocity: 147.00 cm/s  SHUNTS MV E/A ratio:  0.86         Systemic Diam: 2.10 cm Bartholome Bill MD Electronically signed by Bartholome Bill MD Signature Date/Time: 07/14/2020/8:45:55 AM    Final    CT Angio Abd/Pel w/ and/or w/o  Result Date: 07/13/2020 CLINICAL DATA:  Bilateral lower extremity edema.  GI bleed. EXAM: CTA ABDOMEN AND PELVIS WITHOUT AND WITH CONTRAST TECHNIQUE: Multidetector CT imaging of the abdomen and pelvis was performed using the standard protocol during bolus administration of intravenous contrast. Multiplanar reconstructed images and MIPs were obtained and reviewed to evaluate the vascular anatomy. CONTRAST:  144mL OMNIPAQUE IOHEXOL 350 MG/ML SOLN, 164mL OMNIPAQUE IOHEXOL 350 MG/ML SOLN COMPARISON:  07/15/2018 FINDINGS: VASCULAR Aorta: Aneurysmal dilatation of the infrarenal abdominal aorta measures 3.1 cm, image 57/4. Aortic atherosclerosis. No signs of  dissection, vasculitis or stenosis. Celiac: Calcified plaque noted at the origin of the celiac trunk. No significant stenosis identified. SMA: Calcified plaque identified within the proximal superior mesenteric artery with approximately 60% stenosis. Renals: Mild narrowing at the origin of the left renal artery with less than 15% stenosis. Calcification at the origin of the right renal artery is noted with approximately 15% stenosis. IMA: Patent without evidence of aneurysm, dissection, vasculitis or significant stenosis. Inflow: Patent without evidence of aneurysm, dissection, vasculitis or significant stenosis. Proximal Outflow: Bilateral common femoral and visualized portions of the superficial and profunda femoral arteries are patent without evidence of  aneurysm, dissection, vasculitis or significant stenosis. Veins: No obvious venous abnormality within the limitations of this arterial phase study. Review of the MIP images confirms the above findings. NON-VASCULAR Lower chest: Small bilateral pleural effusions with overlying compressive type atelectasis. Hepatobiliary: Low-density structure within posteroinferior right lobe of liver measures 9 mm and is too small to characterize. No suspicious liver abnormality. Previous cholecystectomy. Mild increase caliber of the common bile duct is noted which measures up to 9 mm compatible with post cholecystectomy physiology. Pancreas: Unremarkable. No pancreatic ductal dilatation or surrounding inflammatory changes. Spleen: Normal in size without focal abnormality. Adrenals/Urinary Tract: Normal appearance of the adrenal glands. Bilateral kidney cysts are identified. The largest arises from the lateral cortex of the right mid kidney measuring 5.2 cm. No signs of hydronephrosis bilaterally. Urinary bladder is unremarkable. Stomach/Bowel: Moderate to large hiatal hernia. There is no bowel wall thickening, inflammation or distension. No signs of contrast extravasation within the bowel loops to indicate site of GI bleed. Extensive sigmoid diverticulosis. No acute inflammation. Lymphatic: No abdominopelvic adenopathy. Reproductive: Status post hysterectomy. No adnexal masses. Other: No free fluid or fluid collections identified. There is a periumbilical hernia which contains fat and a loop of nonobstructive transverse colon. Musculoskeletal: No acute or significant osseous findings. IMPRESSION: 1. No acute findings within the abdomen or pelvis. No signs of contrast extravasation within the bowel loops to indicate site of GI bleed. 2. Aortic atherosclerosis. There is a 3.1 cm infrarenal abdominal aortic aneurysm. Recommend follow-up every 3 years. this recommendation follows ACR consensus guidelines: White Paper of the ACR  Incidental Findings Committee II on Vascular Findings. J Am Coll Radiol 2013; 10:789-794. 3. Bilateral kidney cysts. 4. Hiatal hernia. 5. Small bilateral pleural effusions with overlying compressive type atelectasis. 6. Periumbilical hernia contains fat and a loop of nonobstructive transverse colon. Aortic Atherosclerosis (ICD10-I70.0). Aortic aneurysm NOS (ICD10-I71.9). Electronically Signed   By: Kerby Moors M.D.   On: 07/13/2020 06:57    SIGNED: Deatra James, MD, FACP, FHM. [facility],  Pager (please use [email or web address] to page/text)  If 7PM-7AM, please contact night-coverage [email or web address] Ota [place] [name] 07/14/2020, 10:58 AM

## 2020-07-14 NOTE — ED Notes (Signed)
Report given to Ashley RN

## 2020-07-14 NOTE — Progress Notes (Addendum)
This nurse received approval from Catherine for family members to switch out staying with patient during the day. They understand that it will be only 2 visitors to switch out per day.  They are: Ashley [name]

## 2020-07-14 NOTE — TOC Initial Note (Signed)
Transition of Care [facility]) - Initial/Assessment Note    Patient Details  Name: Ashley [name] MRN: [id number] Date of Birth: April 20, 1931  [facility] CM/SW Contact:    Shelbie Hutching, RN Phone Number: 07/14/2020, 3:50 PM  Clinical Narrative:                 Patient brought into the emergency room for altered mental status and found to have a stroke and GI bleed.  Patient is from home where she lives alone.  Patient gets meals on wheels and family brings food over as well.  Son provides transportation.  PT and OT are both recommending SNF for short term rehab.  Patient's son, [name] believes this would be a good idea.  [name] reports that she has been to [facility] in the past but that is no longer an option.  Son prefers [facility] out of the ones in [place].  Bed search started.  TOC team continue to follow.   Expected Discharge Plan: [place] Barriers to Discharge: Continued Medical Work up   Patient Goals and CMS Choice Patient states their goals for this hospitalization and ongoing recovery are:: family would like to pursue rehab and prefers [employer].gov Compare Post Acute Care list provided to:: Patient Choice offered to / list presented to : Patient, Adult Children  Expected Discharge Plan and Services Expected Discharge Plan: [place]   Discharge Planning Services: CM Consult Post Acute Care Choice: [place] Living arrangements for the past 2 months: Single Family Home                           HH Arranged: NA          Prior Living Arrangements/Services Living arrangements for the past 2 months: Single Family Home Lives with:: Self Patient language and need for interpreter reviewed:: Yes Do you feel safe going back to the place where you live?: Yes      Need for Family Participation in Patient Care: Yes (Comment) (stroke, GI bleed) Care giver support system in place?: Yes  (comment) (son)   Criminal Activity/Legal Involvement Pertinent to Current Situation/Hospitalization: No - Comment as needed  Activities of Daily Living      Permission Sought/Granted Permission sought to share information with : Case Freight forwarder, Customer service manager, Family Supports Permission granted to share information with : Yes, Verbal Permission Granted  Share Information with NAME: [name]  Permission granted to share info w AGENCY: SNF of choice  Permission granted to share info w Relationship: son     Emotional Assessment       Orientation: : Oriented to Self, Oriented to Place, Oriented to  Time, Oriented to Situation Alcohol / Substance Use: Not Applicable Psych Involvement: No (comment)  Admission diagnosis:  Stroke (cerebrum) ([place]) [I63.9] Acute CVA (cerebrovascular accident) ([place]) [I63.9] Gastrointestinal hemorrhage, unspecified gastrointestinal hemorrhage type [K92.2] Anemia, unspecified type [D64.9] Patient Active Problem List   Diagnosis Date Noted  . Stroke (cerebrum) ([place]) 07/14/2020  . Coronary artery disease 07/13/2020  . Benign hypertension 07/13/2020  . Acute CVA (cerebrovascular accident) ([place]) 07/13/2020  . Acute on chronic blood loss anemia 07/13/2020  . Severe Symptomatic anemia 07/13/2020  . Acute metabolic encephalopathy 03/50/0938  . COPD (chronic obstructive pulmonary disease) ([place]) 07/13/2020  . Cholecystitis   . Choledocholithiasis 07/15/2018  . Chest pain 07/13/2018  . Primary osteoarthritis of right hip 02/20/2016   PCP:  Albina Billet, MD Pharmacy:   [place] [place] [address] [place] [address] Phone: [phone] Fax: [phone]     Social Determinants of Health (SDOH) Interventions    Readmission Risk Interventions No flowsheet data found.

## 2020-07-14 NOTE — NC FL2 (Signed)
[place] LEVEL OF CARE SCREENING TOOL     IDENTIFICATION  Patient Name: ARTURO FREUNDLICH Birthdate: 03-31-1931 Sex: female Admission Date (Current Location): 07/12/2020  [place] and [place] Number:  Engineering geologist and Address:  [facility], [address] [place], [place] [address]      Provider Number: [id number]  Attending Physician Name and Address:  Deatra James, MD  Relative Name and Phone Number:  Caedence Snowden (son) [phone]    Current Level of Care: Hospital Recommended Level of Care: [place] Prior Approval Number:    Date Approved/Denied:   PASRR Number: [id number]  Discharge Plan: SNF    Current Diagnoses: Patient Active Problem List   Diagnosis Date Noted  . Stroke (cerebrum) ([place]) 07/14/2020  . Coronary artery disease 07/13/2020  . Benign hypertension 07/13/2020  . Acute CVA (cerebrovascular accident) ([place]) 07/13/2020  . Acute on chronic blood loss anemia 07/13/2020  . Severe Symptomatic anemia 07/13/2020  . Acute metabolic encephalopathy 29/93/7169  . COPD (chronic obstructive pulmonary disease) ([place]) 07/13/2020  . Cholecystitis   . Choledocholithiasis 07/15/2018  . Chest pain 07/13/2018  . Primary osteoarthritis of right hip 02/20/2016    Orientation RESPIRATION BLADDER Height & Weight     Self, Time, Situation, Place  Normal Continent Weight: 73.5 kg Height:  4\' 11"  (149.9 cm)  BEHAVIORAL SYMPTOMS/MOOD NEUROLOGICAL BOWEL NUTRITION STATUS      Continent Diet (see discharge summary)  AMBULATORY STATUS COMMUNICATION OF NEEDS Skin   Extensive Assist Verbally Bruising                       Personal Care Assistance Level of Assistance  Bathing, Feeding, Dressing Bathing Assistance: Maximum assistance Feeding assistance: Limited assistance Dressing Assistance: Maximum assistance     Functional Limitations Info             SPECIAL CARE FACTORS FREQUENCY  PT (By  licensed PT), OT (By licensed OT)     PT Frequency: 5 times per week OT Frequency: 5 times per week            Contractures Contractures Info: Not present    Additional Factors Info  Code Status, Allergies Code Status Info: DNR Allergies Info: NKA           Current Medications (07/14/2020):  This is the current hospital active medication list Current Facility-Administered Medications  Medication Dose Route Frequency Provider Last Rate Last Admin  .  stroke: mapping our early stages of recovery book   Does not apply Once Judd Gaudier V, MD      . 0.9 %  sodium chloride infusion   Intravenous Continuous Athena Masse, MD 75 mL/hr at 07/14/20 0607 New Bag at 07/14/20 6789  . acetaminophen (TYLENOL) tablet 650 mg  650 mg Oral Q4H PRN Athena Masse, MD       Or  . acetaminophen (TYLENOL) 160 MG/5ML solution 650 mg  650 mg Per Tube Q4H PRN Athena Masse, MD       Or  . acetaminophen (TYLENOL) suppository 650 mg  650 mg Rectal Q4H PRN Athena Masse, MD   650 mg at 07/13/20 1142  . artificial tears (LACRILUBE) ophthalmic ointment   Both Eyes Q8H Shahmehdi, Seyed A, MD   1 application at 38/10/17 0904  . docusate sodium (COLACE) capsule 100 mg  100 mg Oral BID Shahmehdi, Seyed A, MD   100 mg at 07/14/20 1149  .  ferrous sulfate tablet 325 mg  325 mg Oral BID WC Shahmehdi, Seyed A, MD   325 mg at 07/14/20 1149  . gadobutrol (GADAVIST) 1 MMOL/ML injection 7 mL  7 mL Intravenous Once PRN Donnetta Simpers, MD      . HYDROcodone-acetaminophen (NORCO/VICODIN) 5-325 MG per tablet 1 tablet  1 tablet Oral Q6H PRN Shahmehdi, Seyed A, MD      . sodium chloride flush (NS) 0.9 % injection 10-40 mL  10-40 mL Intracatheter PRN Athena Masse, MD         Discharge Medications: Please see discharge summary for a list of discharge medications.  Relevant Imaging Results:  Relevant Lab Results:   Additional Information SS# [id number]  Shelbie Hutching, RN

## 2020-07-14 NOTE — ED Notes (Signed)
Hospitalist at bedside with pt and family member, explaining imaging results from yesterday.

## 2020-07-14 NOTE — Evaluation (Signed)
Occupational Therapy Evaluation Patient Details Name: Ashley [name] MRN: [id number] DOB: 06-14-1931 Today's Date: 07/14/2020    History of Present Illness Per MD note:Ashley [name] is a 86 y.o. female with medical history significant for CAD, COPD, HTN who presents to the emergency room with altered mental status x2 days.  Most of the history is taken from the son who states that he noticed that Ashley [name] was lethargic and not her usual self and appeared to not recognize him and Ashley symptoms worsened into today prompting him to bring her into the emergency room.  He states that she had been complaining of weakness for the past 2 weeks which she attributed to her age. He also states that she has been having hip pain and has been seeing the orthopedist and taking Tylenol but no NSAIDs for her pain.    Clinical Impression   Ashley [name] was seen for OT evaluation this date. Prior to hospital admission, pt was MOD I for mobility and ADLs using SPC and RW for household distances. Pt lives alone c family available PRN. Pt presents to acute OT demonstrating impaired ADL performance and functional mobility 2/2 decreased safety awareness, functional strength/ROM/balance deficits, decreased activity tolerance, and functional visual deficits. Pt currently requires MAX A for LBD at bed level. SBA for seated grooming tasks. CGA + 2HHA + VCs for ADL t/f - cues for sequencing. SpO2 stable t/o on 3L [place]. Intermittent increase in standing of RR to mid 30s- resolved c PLB. Pt would benefit from skilled OT to address noted impairments and functional limitations (see below for any additional details) in order to maximize safety and independence while minimizing falls risk and caregiver burden. Upon hospital discharge, recommend STR to maximize pt safety and return to PLOF.     Follow Up Recommendations  SNF    Equipment Recommendations  Other (comment) (TBD)    Recommendations for Other Services       Precautions  / Restrictions Precautions Precautions: Fall Restrictions Weight Bearing Restrictions: No      Mobility Bed Mobility Overal bed mobility: Needs Assistance Bed Mobility: Supine to Sit;Sit to Supine     Supine to sit: Mod assist;HOB elevated Sit to supine: Mod assist      Transfers Overall transfer level: Needs assistance Equipment used: 2 person hand held assist Transfers: Sit to/from Stand Sit to Stand: Min guard;From elevated surface         General transfer comment: small shuffling steps at EOB - son states normal gait pattern    Balance Overall balance assessment: Needs assistance Sitting-balance support: Single extremity supported;Feet supported Sitting balance-Leahy Scale: Fair     Standing balance support: Bilateral upper extremity supported Standing balance-Leahy Scale: Poor          ADL either performed or assessed with clinical judgement   ADL Overall ADL's : Needs assistance/impaired       General ADL Comments: MAX A for LBD at bed level. SBA for seated grooming tasks. CGA + 2HHA + VCs for ADL t/f - cues for sequqncing and direction     Vision  Decreased smooth pursuits on H test. MAX VCs and increased time for FNF. Mild visual impairments noted - to be further assessed in functional context.              Pertinent Vitals/Pain Pain Assessment: No/denies pain     Hand Dominance  Right   Extremity/Trunk Assessment Upper Extremity Assessment Upper Extremity Assessment: Generalized weakness; Increased time FNF  on dominant RUE. BUE AROM ~90*   Lower Extremity Assessment Lower Extremity Assessment: Generalized weakness       Communication Communication Communication: No difficulties   Cognition Arousal/Alertness: Awake/alert Behavior During Therapy: WFL for tasks assessed/performed Overall Cognitive Status: History of cognitive impairments - at baseline          General Comments  SpO2 stable t/o on 3L [place]. Intermittent increase in  standing of RR to mid 30s- resolved c PLB    Exercises Exercises: Other exercises Other Exercises Other Exercises: Pt educated re: OT role, DME recs, d/c recs, HEP, importance of supervision for safe mobility, stroke education, falls prevention, ECS Other Exercises: sup<>sit, sit<>stand, sitting/standing balance/tolerance, LBD        Home Living Family/patient expects to be discharged to:: Skilled nursing facility       Additional Comments: Pt lives alone      Prior Functioning/Environment Level of Independence: Independent with assistive device(s)        Comments: Baseline difficulty c word finding and memory deficits per son. Reports using SPC during day and RW at night for household distances        OT Problem List: Decreased strength;Decreased range of motion;Decreased activity tolerance;Impaired balance (sitting and/or standing);Impaired vision/perception;Decreased safety awareness;Decreased knowledge of precautions      OT Treatment/Interventions: Self-care/ADL training;Therapeutic exercise;Energy conservation;DME and/or AE instruction;Therapeutic activities;Patient/family education;Balance training    OT Goals(Current goals can be found in the care plan section) Acute Rehab OT Goals Patient Stated Goal: to walk OT Goal Formulation: With patient/family Time For Goal Achievement: 07/28/20 Potential to Achieve Goals: Good ADL Goals Pt Will Perform Grooming: with supervision;sitting Pt Will Perform Lower Body Dressing: with min assist;sit to/from stand;with caregiver independent in assisting (c LRAD PRN) Pt Will Transfer to Toilet: ambulating;bedside commode;with supervision (c LRAD PRN) Additional ADL Goal #1: Pt will Independently verbalize plan to implement x3 ECS  OT Frequency: Min 1X/week   Barriers to D/C: Inaccessible home environment;Decreased caregiver support             AM-PAC OT "6 Clicks" Daily Activity     Outcome Measure Help from another person  eating meals?: A Little Help from another person taking care of personal grooming?: A Little Help from another person toileting, which includes using toliet, bedpan, or urinal?: A Lot Help from another person bathing (including washing, rinsing, drying)?: A Lot Help from another person to put on and taking off regular upper body clothing?: A Little Help from another person to put on and taking off regular lower body clothing?: A Lot 6 Click Score: 15   End of Session Equipment Utilized During Treatment: Oxygen Nurse Communication: Mobility status  Activity Tolerance: Patient tolerated treatment well Patient left: in bed;with call bell/phone within reach;with bed alarm set;with family/visitor present  OT Visit Diagnosis: Other abnormalities of gait and mobility (R26.89)                Time: 9379-0240 OT Time Calculation (min): 24 min Charges:  OT General Charges $OT Visit: 1 Visit OT Evaluation $OT Eval Moderate Complexity: 1 Mod OT Treatments $Self Care/Home Management : 8-22 mins  Dessie Coma, M.S. OTR/L  07/14/20, 1:45 PM  ascom [phone]

## 2020-07-14 NOTE — ED Notes (Signed)
Called 2A to inquire about transferring pt to assigned bed. Was told that they won't be taking pt. They will contact [facility] re: placement.

## 2020-07-14 NOTE — ED Notes (Signed)
Floor not able to take pt at this time.

## 2020-07-15 ENCOUNTER — Inpatient Hospital Stay: Payer: Medicare Other

## 2020-07-15 DIAGNOSIS — G9341 Metabolic encephalopathy: Secondary | ICD-10-CM

## 2020-07-15 DIAGNOSIS — Z66 Do not resuscitate: Secondary | ICD-10-CM

## 2020-07-15 DIAGNOSIS — K922 Gastrointestinal hemorrhage, unspecified: Secondary | ICD-10-CM

## 2020-07-15 DIAGNOSIS — D62 Acute posthemorrhagic anemia: Secondary | ICD-10-CM

## 2020-07-15 DIAGNOSIS — Z515 Encounter for palliative care: Secondary | ICD-10-CM

## 2020-07-15 DIAGNOSIS — Z7189 Other specified counseling: Secondary | ICD-10-CM

## 2020-07-15 LAB — CBC
HCT: 30.1 % — ABNORMAL LOW (ref 36.0–46.0)
Hemoglobin: 8.5 g/dL — ABNORMAL LOW (ref 12.0–15.0)
MCH: 20 pg — ABNORMAL LOW (ref 26.0–34.0)
MCHC: 28.2 g/dL — ABNORMAL LOW (ref 30.0–36.0)
MCV: 70.8 fL — ABNORMAL LOW (ref 80.0–100.0)
Platelets: 165 10*3/uL (ref 150–400)
RBC: 4.25 MIL/uL (ref 3.87–5.11)
RDW: 23.8 % — ABNORMAL HIGH (ref 11.5–15.5)
WBC: 8.5 10*3/uL (ref 4.0–10.5)
nRBC: 0.4 % — ABNORMAL HIGH (ref 0.0–0.2)

## 2020-07-15 LAB — BASIC METABOLIC PANEL
Anion gap: 7 (ref 5–15)
BUN: 12 mg/dL (ref 8–23)
CO2: 23 mmol/L (ref 22–32)
Calcium: 8.3 mg/dL — ABNORMAL LOW (ref 8.9–10.3)
Chloride: 108 mmol/L (ref 98–111)
Creatinine, Ser: 0.55 mg/dL (ref 0.44–1.00)
GFR calc Af Amer: >60 mL/min (ref >60)
GFR calc non Af Amer: >60 mL/min (ref >60)
Glucose, Bld: 98 mg/dL (ref 70–99)
Potassium: 4.2 mmol/L (ref 3.5–5.1)
Sodium: 138 mmol/L (ref 135–145)

## 2020-07-15 MED ORDER — SIMVASTATIN 20 MG PO TABS
40.0000 mg | ORAL_TABLET | Freq: Every day | ORAL | Status: DC
  Administered 2020-07-15 – 2020-07-24 (×10): 40 mg via ORAL
  Filled 2020-07-15 (×10): qty 2

## 2020-07-15 MED ORDER — ALPRAZOLAM 0.25 MG PO TABS
0.2500 mg | ORAL_TABLET | Freq: Two times a day (BID) | ORAL | Status: DC
  Administered 2020-07-15 (×2): 0.25 mg via ORAL
  Filled 2020-07-15 (×3): qty 1

## 2020-07-15 MED ORDER — ISOSORBIDE DINITRATE 30 MG PO TABS
30.0000 mg | ORAL_TABLET | Freq: Two times a day (BID) | ORAL | Status: DC
  Administered 2020-07-15 – 2020-07-24 (×19): 30 mg via ORAL
  Filled 2020-07-15 (×20): qty 1

## 2020-07-15 MED ORDER — FUROSEMIDE 10 MG/ML IJ SOLN
40.0000 mg | Freq: Once | INTRAMUSCULAR | Status: AC
  Administered 2020-07-15: 40 mg via INTRAVENOUS
  Filled 2020-07-15: qty 4

## 2020-07-15 MED ORDER — MAGNESIUM CITRATE PO SOLN
1.0000 | Freq: Once | ORAL | Status: AC
  Administered 2020-07-15: 1 via ORAL
  Filled 2020-07-15: qty 296

## 2020-07-15 MED ORDER — METOPROLOL SUCCINATE ER 50 MG PO TB24
75.0000 mg | ORAL_TABLET | Freq: Every day | ORAL | Status: DC
  Administered 2020-07-15 – 2020-07-24 (×10): 75 mg via ORAL
  Filled 2020-07-15 (×20): qty 1

## 2020-07-15 MED ORDER — VITAMIN B-12 1000 MCG PO TABS
1000.0000 ug | ORAL_TABLET | Freq: Every day | ORAL | Status: DC
  Administered 2020-07-15 – 2020-07-24 (×10): 1000 ug via ORAL
  Filled 2020-07-15 (×10): qty 1

## 2020-07-15 MED ORDER — ENSURE ENLIVE PO LIQD
237.0000 mL | Freq: Two times a day (BID) | ORAL | Status: DC
  Administered 2020-07-15 – 2020-07-24 (×19): 237 mL via ORAL

## 2020-07-15 MED ORDER — IPRATROPIUM-ALBUTEROL 20-100 MCG/ACT IN AERS
1.0000 | INHALATION_SPRAY | Freq: Four times a day (QID) | RESPIRATORY_TRACT | Status: DC
  Administered 2020-07-15 – 2020-07-17 (×10): 1 via RESPIRATORY_TRACT
  Filled 2020-07-15: qty 4

## 2020-07-15 NOTE — Progress Notes (Signed)
PROGRESS NOTE    Patient: Ashley [name]                            PCP: Albina Billet, MD                    DOB: 1931/04/01            DOA: 07/12/2020 SEG:[id number]             DOS: 07/15/2020, 11:00 AM   LOS: 2 days   Date of Service: The patient was seen and examined on 07/15/2020  Subjective:    The patient was seen and examined this morning, remained stable, in mild respiratory distress For respiratory wheezing, satting 97% on 2 L of oxygen  Awake alert following commands.       Brief Narrative:   Per HPI:  Ashley [name] is a 86 y.o. f w PMH/o   CAD, COPD, HTN who presents to the emergency room with  with 2-day history of altered mental status preceded by 2-week history of weakness   On presentation patient was lethargic, somnolent unable to provide history History is provided by son with complaint of 2 weeks weaknesses progressive lethargic. -Has seen PCP and cardiology in the past 3 weeks.  No medication changes.  Denies excessive NSAID use. This morning patient reported she was taken off aspirin Plavix due to previous history of anemia questionable GI bleed in the past.   ED Course:  On admission was lethargic, confused tachypneic, BP was stable  Blood work notable for hemoglobin of 4.7.  Last hemoglobin on file 10.7 from a year prior.  Stool guaiac positive.   SARS-CoV-2 negative -chest x-ray with no acute findings.   -Head CT showed right occipital lobe hypodensity consistent with acute or subacute infarct.   Blood transfusion started in the ER.      Assessment & Plan:   Principal Problem:   Acute CVA (cerebrovascular accident) [facility]) Active Problems:   Coronary artery disease   Benign hypertension   Acute on chronic blood loss anemia   Severe Symptomatic anemia   Acute metabolic encephalopathy   COPD (chronic obstructive pulmonary disease) (HCC)   Stroke (cerebrum) (HCC)    Acute/subacute CVA (cerebrovascular accident) ([place]), in the setting of  severe anemia -Mental status back to baseline -Mild difficulty with word finding otherwise stable no new focal neurological findings   -Patient presents with confusion with right occipital lobe infarct on CT -Symptom onset 48 hours prior to arrival, outside TPA window and not a candidate either way due to positive stool guaiac and severe anemia -Stroke work-up to include carotid Doppler  -Echocardiogram:  -Cardiac monitoring to evaluate for arrhythmias -Neurology consulted ... Appreciate further evaluation input -MRI/MRA -consistent with aneurysms-right PICA right PCA ischemic changes Left and right ICA aneurysm largest 10 mm -Bilateral carotid study, right 70% stenosis, left 60-65% stenosis  -Appreciate neurology recommendations -Anticipating once patient stable from the GI standpoint restarted on aspirin    Acute respiratory distress   COPD (chronic obstructive pulmonary disease) (HCC)-mild exacerbation -Adding supplemental oxygen, currently on 2 L of oxygen, satting 97% -Supplement fever as needed -DuoNebs as needed  -Continue supplemental oxygen, nebs as needed -Mild COPD exacerbation versus mild volume overload -We will obtain x-rays, labs BMP, -Discontinue IV fluids -Lasix 40 mg x 1 -We will monitor I's and O's, daily weight    Acute on suspect chronic Blood loss anemia/  Severe Symptomatic anemia -Status post 2 units of PRBC.  Pending repeat H&H anticipating another unit of transfusion -On admission hemoglobin 4.7 ---2U PRBC transfused >> 8.1>>8.5  guaiac positive,--previously hemoglobin was >10.0 at baseline -Hemodynamically stable -GI consulted -believe the patient might help chronic iron deficiency, possible multiple chronic microbleed GNR recommending upper lower endoscopy at this time.    Acute metabolic encephalopathy -Back to baseline alert oriented x3 -Likely due to anemia, versus stroke -We will continue treating underlying causing anemia and  CVA -Continue neurochecks      Coronary artery disease -Stable denies any chest pain -Hold aspirin products.   -Reviewed home medication, resuming Imdur, beta-blockers, But holding lisinopril,    Benign hypertension -BP stable Blood pressures running low due to severe anemia -Withholding amlodipine, lisinopril,   Nutritional status:        Cultures; Blood Cultures x 2 >> NGT Urine Culture  >>> NGT   Antimicrobials:     Consultants: GI and Neurology    ---------------------------------------------------------------------------------------------------------------------------------------------  DVT prophylaxis:  SCD/Compression stockings Code Status:   Code Status: DNR Family Communication: son Mr. Ashley [name] -present at bedside, updated  The above findings and plan of care has been discussed son in detail,  they expressed understanding and agreement of above. -Advance care planning has been discussed.   Admission status:    Status is: Inpatient  Remains inpatient appropriate because:Inpatient level of care appropriate due to severity of illness   Dispo: The patient is from: Home              Anticipated d/c is to: SNF              Anticipated d/c date is: 3 days              Patient currently is not medically stable to d/c.        Procedures:   No admission procedures for hospital encounter.     Antimicrobials:  Anti-infectives (From admission, onward)   None       Medication:  .  stroke: mapping our early stages of recovery book   Does not apply Once  . ALPRAZolam  0.25 mg Oral BID  . artificial tears   Both Eyes Q8H  . docusate sodium  100 mg Oral BID  . ferrous sulfate  325 mg Oral BID WC  . Ipratropium-Albuterol  1 puff Inhalation Q6H  . isosorbide dinitrate  30 mg Oral BID  . magnesium citrate  1 Bottle Oral Once  . metoprolol succinate  75 mg Oral Daily  . simvastatin  40 mg Oral Daily  . vitamin B-12  1,000 mcg Oral  Daily    acetaminophen **OR** acetaminophen (TYLENOL) oral liquid 160 mg/5 mL **OR** acetaminophen, ALPRAZolam, gadobutrol, HYDROcodone-acetaminophen, sodium chloride flush   Objective:   Vitals:   07/14/20 1506 07/14/20 1929 07/14/20 2324 07/15/20 0802  BP: (!) 142/65 (!) 155/69 126/62 130/71  Pulse: 97 98 89 100  Resp: (!) 25 16 17 18   Temp: 98 F (36.7 C) 97.8 F (36.6 C) 98.4 F (36.9 C) 98.4 F (36.9 C)  TempSrc: Oral Oral Oral Oral  SpO2: 98% 96% 96% 97%  Weight:      Height:        Intake/Output Summary (Last 24 hours) at 07/15/2020 1100 Last data filed at 07/15/2020 0620 Gross per 24 hour  Intake 666.25 ml  Output 700 ml  Net -33.75 ml   Filed Weights   07/12/20 2243  Weight: 73.5 kg  Examination:       Physical Exam:   General:  Alert, oriented, cooperative, no distress;   HEENT:  Normocephalic, PERRL, otherwise with in Normal limits   Neuro:  CNII-XII intact. , normal motor and sensation, reflexes intact   Lungs:    Noted to have mild shortness of breath, upper respiratory wheezing  L, Respirations unlabored, mild-minimal crackles at lower lobes  Cardio:    S1/S2, RRR, No murmure, No Rubs or Gallops   Abdomen:   Soft, non-tender, bowel sounds active all four quadrants,  no guarding or peritoneal signs.  Muscular skeletal:  Limited exam - in bed, able to move all 4 extremities, Normal strength,  2+ pulses,  symmetric, No pitting edema  Skin:  Dry, warm to touch, negative for any Rashes, No open wounds  Wounds: Please see nursing documentation               ------------------------------------------------------------------------------------------------------------------------------------------    LABs:  CBC Latest Ref Rng & Units 07/15/2020 07/14/2020 07/13/2020  WBC 4.0 - 10.5 K/uL 8.5 5.9 -  Hemoglobin 12.0 - 15.0 g/dL 8.5(L) 8.1(L) 8.6(L)  Hematocrit 36 - 46 % 30.1(L) 28.2(L) 29.3(L)  Platelets 150 - 400 K/uL 165 173 -   CMP Latest  Ref Rng & Units 07/15/2020 07/14/2020 07/12/2020  Glucose 70 - 99 mg/dL 98 96 122(H)  BUN 8 - 23 mg/dL 12 14 29(H)  Creatinine 0.44 - 1.00 mg/dL 0.55 0.59 1.07(H)  Sodium 135 - 145 mmol/L 138 140 137  Potassium 3.5 - 5.1 mmol/L 4.2 3.4(L) 3.4(L)  Chloride 98 - 111 mmol/L 108 109 105  CO2 22 - 32 mmol/L 23 26 25   Calcium 8.9 - 10.3 mg/dL 8.3(L) 8.0(L) 9.0  Total Protein 6.5 - 8.1 g/dL - - 6.1(L)  Total Bilirubin 0.3 - 1.2 mg/dL - - 0.8  Alkaline Phos 38 - 126 U/L - - 55  AST 15 - 41 U/L - - 14(L)  ALT 0 - 44 U/L - - 12       Micro Results Recent Results (from the past 240 hour(s))  SARS Coronavirus 2 by RT PCR (hospital order, performed in [facility] hospital lab) Nasopharyngeal Nasopharyngeal Swab     Status: None   Collection Time: 07/13/20 12:09 AM   Specimen: Nasopharyngeal Swab  Result Value Ref Range Status   SARS Coronavirus 2 NEGATIVE NEGATIVE Final    Comment: (NOTE) SARS-CoV-2 target nucleic acids are NOT DETECTED.  The SARS-CoV-2 RNA is generally detectable in upper and lower respiratory specimens during the acute phase of infection. The lowest concentration of SARS-CoV-2 viral copies this assay can detect is 250 copies / mL. A negative result does not preclude SARS-CoV-2 infection and should not be used as the sole basis for treatment or other patient management decisions.  A negative result may occur with improper specimen collection / handling, submission of specimen other than nasopharyngeal swab, presence of viral mutation(s) within the areas targeted by this assay, and inadequate number of viral copies (<250 copies / mL). A negative result must be combined with clinical observations, patient history, and epidemiological information.  Fact Sheet for Patients:   StrictlyIdeas.no  Fact Sheet for Healthcare Providers: BankingDealers.co.za  This test is not yet approved or  cleared by the [place] FDA and has  been authorized for detection and/or diagnosis of SARS-CoV-2 by FDA under an Emergency Use Authorization (EUA).  This EUA will remain in effect (meaning this test can be used) for the duration of the COVID-19 declaration  under Section 564(b)(1) of the Act, 21 U.S.C. section 360bbb-3(b)(1), unless the authorization is terminated or revoked sooner.  Performed at [facility], [address] [place], [place] [address]     Radiology Reports CT ANGIO HEAD W OR WO CONTRAST  Result Date: 07/14/2020 CLINICAL DATA:  Stroke EXAM: CT ANGIOGRAPHY HEAD AND NECK TECHNIQUE: Multidetector CT imaging of the head and neck was performed using the standard protocol during bolus administration of intravenous contrast. Multiplanar CT image reconstructions and MIPs were obtained to evaluate the vascular anatomy. Carotid stenosis measurements (when applicable) are obtained utilizing NASCET criteria, using the distal internal carotid diameter as the denominator. CONTRAST:  76mL OMNIPAQUE IOHEXOL 350 MG/ML SOLN COMPARISON:  CT head and MRI head 07/13/2020 FINDINGS: CT HEAD FINDINGS Brain: Hypodensity throughout most of the right occipital lobe unchanged from recent CT and MRI compatible with acute right PCA infarct. No associated hemorrhage. Patchy white matter hypodensity bilaterally consistent with chronic microvascular ischemia of a moderate degree. Ventricle size normal. Negative for mass lesion. Vascular: Negative for hyperdense vessel Skull: Negative Sinuses: Paranasal sinuses clear. Orbits: Bilateral cataract extraction.  No orbital mass. Review of the MIP images confirms the above findings CTA NECK FINDINGS Aortic arch: Atherosclerotic calcification aortic arch and proximal great vessels without stenosis or aneurysm. Tortuosity of the proximal great vessels noted. Right carotid system: Atherosclerotic disease right carotid bifurcation extending into the proximal right internal carotid artery. 40% diameter  stenosis proximal right internal carotid artery. Left carotid system: Atherosclerotic calcification left carotid bifurcation without significant stenosis. Vertebral arteries: Moderate calcific stenosis at the origin of the right vertebral artery. Right vertebral artery is patent to the basilar without additional stenosis. Non dominant left vertebral artery without significant stenosis. Skeleton: No acute abnormality. Other neck: Negative for mass or adenopathy. Upper chest: Moderate right pleural effusion and small left effusion. Negative for mass or pneumonia in the lung apices. Review of the MIP images confirms the above findings CTA HEAD FINDINGS Anterior circulation: Atherosclerotic calcification throughout the cavernous carotid bilaterally without significant stenosis. 6 x 9 mm aneurysm of the right cavernous carotid proximal to the anterior genu. This projects medially. 3 mm aneurysm in the anterior genu of the left cavernous carotid in the ophthalmic artery region. Possible small blister aneurysm of the left internal carotid artery projecting medially as noted on MRA. Anterior and middle cerebral arteries patent bilaterally without significant stenosis. Posterior circulation: Both vertebral arteries patent to the basilar. Right vertebral artery dominant. PICA patent bilaterally. Basilar widely patent. AICA, superior cerebellar, and posterior cerebral arteries patent bilaterally without stenosis. Right PCA widely patent in the setting of acute right PCA infarct. Venous sinuses: Normal venous enhancement. Anatomic variants: None Review of the MIP images confirms the above findings IMPRESSION: 1. Acute infarct right PCA territory unchanged. Negative for hemorrhage. Right PCA is patent. 2. Atherosclerotic disease right carotid bifurcation with 40% diameter stenosis proximal right internal carotid artery. Left carotid patent without significant stenosis 3. Moderate stenosis due to atherosclerotic calcification  origin of right vertebral artery. Left vertebral artery patent without significant stenosis. 4. Bilateral cavernous carotid aneurysms as above. Electronically Signed   By: Franchot Gallo M.D.   On: 07/14/2020 11:46   CT Head Wo Contrast  Result Date: 07/13/2020 CLINICAL DATA:  Altered level of consciousness EXAM: CT HEAD WITHOUT CONTRAST TECHNIQUE: Contiguous axial images were obtained from the base of the skull through the vertex without intravenous contrast. COMPARISON:  None. FINDINGS: Brain: Hypodensity within the right occipital lobe consistent with  acute or subacute infarct. Effacement of the overlying sulci and occipital horn of the right lateral ventricle. No evidence of hemorrhagic conversion. Scattered hypodensities elsewhere throughout the periventricular and subcortical white matter likely reflect chronic small vessel ischemic change. No acute hemorrhage. Lateral ventricles and midline structures are otherwise unremarkable. No acute extra-axial fluid collections. No mass effect. Vascular: No hyperdense vessel or unexpected calcification. Skull: Normal. Negative for fracture or focal lesion. Sinuses/Orbits: No acute finding. Other: None. IMPRESSION: 1. Right occipital lobe hypodensity consistent with acute or subacute infarct. 2. Likely chronic small vessel ischemic changes scattered throughout the white matter. 3. No acute hemorrhage. Critical Value/emergent results were called by telephone at the time of interpretation on 07/13/2020 at 3:16 am to provider [facility] , who verbally acknowledged these results. Electronically Signed   By: Randa Ngo M.D.   On: 07/13/2020 03:21   CT ANGIO NECK W OR WO CONTRAST  Result Date: 07/14/2020 : CT angiogram neck reported under the CT angio head report and accession number Electronically Signed   By: Franchot Gallo M.D.   On: 07/14/2020 11:53   MR ANGIO HEAD WO CONTRAST  Result Date: 07/13/2020 CLINICAL DATA:  86 year old female with altered  mental status. Increasing confusion x3 days. Acute to subacute appearing right PCA territory infarct on plain head CT earlier today. History of GI bleeding, and substantially low hemoglobin and hematocrit levels on presentation. Being transfused. EXAM: MRA HEAD WITHOUT CONTRAST TECHNIQUE: Angiographic images of the Circle of Willis were obtained using MRA technique without intravenous contrast. COMPARISON:  Neck MRA today reported separately. FINDINGS: Antegrade flow in the posterior circulation with dominant distal right vertebral artery. No distal vertebral stenosis. Patent left PICA origin. The right AICA appears to be dominant. No right PICA identified. Patent vertebrobasilar junction. Patent basilar artery without stenosis. Patent SCA and PCA origins, and bilateral PCA branches appear symmetric and within normal limits. Antegrade flow in both ICA siphons. Both cavernous ICA segments appear abnormal, with a large roughly 10 mm saccular versus fusiform aneurysm of the cavernous right ICA directed medially. And there is a much smaller 3 mm saccular aneurysm of the cavernous left ICA directed superiorly and medially. See series 1, images 96 and 100. furthermore, there is evidence of a second small distal left ICA aneurysm, in the form of a 3-4 mm aneurysm directed medially from the left anterior genu (intradural). But no superimposed ICA stenosis is identified in both carotid termini are patent. Ophthalmic artery origins appear within normal limits. Posterior communicating arteries are diminutive or absent. Patent MCA and ACA origins. Anterior communicating artery and visible ACA branches are within normal limits. MCA M1 segments and bifurcations are patent without evidence of stenosis. Visible bilateral MCA branches are within normal limits. IMPRESSION: 1. Positive for multiple ICA aneurysms: - large, roughly 10 mm aneurysm of the cavernous Right ICA directed medially. - 4 mm Left ICA anterior genu aneurysm  (intradural). - 3 mm cavernous Left ICA aneurysm. 2. Negative for large vessel occlusion or intracranial stenosis, the Right PCA is within normal limits. 3. Considering both the intracranial and extracranial MRA findings, a follow-up CTA Head and Neck with IV contrast is recommended when feasible. Electronically Signed   By: Genevie Ann M.D.   On: 07/13/2020 18:18   MR ANGIO NECK WO CONTRAST  Result Date: 07/13/2020 CLINICAL DATA:  86 year old female with altered mental status. Increasing confusion x3 days. Acute to subacute appearing right PCA territory infarct on plain head CT earlier today. History of  GI bleeding, and substantially low hemoglobin and hematocrit levels on presentation. Being transfused. A study without and with contrast was planned but the patient's IV access infiltrated. EXAM: MRA NECK WITHOUT CONTRAST TECHNIQUE: Angiographic images of the neck were obtained using MRA technique without intravenous contrast. Carotid stenosis measurements (when applicable) are obtained utilizing NASCET criteria, using the distal internal carotid diameter as the denominator. COMPARISON:  Brain MRI today. Carotid Doppler ultrasound earlier today. FINDINGS: Time-of-flight MRA images demonstrate antegrade flow in both cervical carotid and vertebral arteries to the skull base. Dominant appearing right vertebral artery, no evidence of vertebral artery stenosis in the visible V1 or V2 segments. Both carotid bifurcations also are patent, but there are no flow gaps to suggest a high-grade cervical ICA stenosis. However, there is irregularity and narrowing of the proximal right ICA (series 6, image 73), roughly estimated at 50 % with respect to the distal vessel on these images. Mild left ICA distal bulb irregularity without that degree of stenosis. IMPRESSION: Evidence of right greater than left ICA atherosclerosis, but time-of-flight MRA does NOT confirm a high-grade carotid stenosis in the neck. Follow-up CTA neck with IV  contrast would best evaluate further as necessary. Electronically Signed   By: Genevie Ann M.D.   On: 07/13/2020 18:11   MR BRAIN WO CONTRAST  Result Date: 07/13/2020 CLINICAL DATA:  86 year old female with altered mental status. Increasing confusion x3 days. Acute to subacute appearing right PCA territory infarct on plain head CT earlier today. History of GI bleeding, and substantially low hemoglobin and hematocrit levels on presentation. Being transfused. EXAM: MRI HEAD WITHOUT CONTRAST TECHNIQUE: Multiplanar, multiecho pulse sequences of the brain and surrounding structures were obtained without intravenous contrast. COMPARISON:  Head CT 0224 hours today. FINDINGS: Brain: Confluent restricted diffusion throughout the right occipital lobe, posterior right temporal lobe including the right hippocampal formation. T2 and FLAIR hyperintense cytotoxic edema in the affected areas. Minimal T1 hypointensity. The right thalamus is spared. No acute intracranial hemorrhage identified. No significant intracranial mass effect. Additionally there is a small focus of patchy restricted diffusion in the right inferior cerebellum, PICA territory (series 9, image 10) with minimal T2 and FLAIR hyperintensity. No mass effect. No other restricted diffusion. Widely scattered moderate for age bilateral cerebral white matter T2 and FLAIR hyperintensity. But no chronic cortical encephalomalacia or chronic cerebral blood products are identified. Mild T2 heterogeneity in the deep gray nuclei, primarily the left thalamus. No midline shift, mass effect, evidence of mass lesion, ventriculomegaly, extra-axial collection or acute intracranial hemorrhage. Cervicomedullary junction and pituitary are within normal limits. Vascular: Major intracranial vascular flow voids are preserved. Mild intracranial artery tortuosity. Skull and upper cervical spine: Negative for age visible cervical spine. Mildly heterogeneous background bone marrow signal, also  mildly abnormal on DWI. Sinuses/Orbits: Negative. Other: Scalp and face appear negative. IMPRESSION: 1. Confluent acute Right PCA territory infarct. Subtle acute infarct also in the cerebellum, Right PICA territory. No associated hemorrhage or mass effect. 2. Moderate for age chronic white matter signal changes likely due to small vessel disease. 3. Heterogeneous bone marrow signal likely reflects red marrow reactivation in the setting of anemia. Electronically Signed   By: Genevie Ann M.D.   On: 07/13/2020 18:06   US Carotid Bilateral (at [facility] and [facility] only)  Result Date: 07/13/2020 CLINICAL DATA:  Stroke. History of previous myocardial infarction and hypertension. EXAM: BILATERAL CAROTID DUPLEX ULTRASOUND TECHNIQUE: Pearline Cables scale imaging, color Doppler and duplex ultrasound were performed of bilateral carotid and vertebral arteries in  the neck. COMPARISON:  None. FINDINGS: Criteria: Quantification of carotid stenosis is based on velocity parameters that correlate the residual internal carotid diameter with NASCET-based stenosis levels, using the diameter of the distal internal carotid lumen as the denominator for stenosis measurement. The following velocity measurements were obtained: RIGHT ICA: 267/55 cm/sec CCA: 32/99 cm/sec SYSTOLIC ICA/CCA RATIO:  3.6 ECA: 126 cm/sec LEFT ICA: 177/39 cm/sec CCA: 24/26 cm/sec SYSTOLIC ICA/CCA RATIO:  2.8 ECA: 102 cm/sec RIGHT CAROTID ARTERY: There is a minimal amount of intimal thickening involving the right carotid bulb (image 21). There is a large amount predominantly hypoechoic plaque involving the right internal carotid artery resulting in elevated peak systolic velocities throughout the interrogated course of the right internal carotid artery. Greatest acquired peak systolic velocity within the mid right ICA measures 268 centimeters/second (image 38). RIGHT VERTEBRAL ARTERY:  Antegrade Flow LEFT CAROTID ARTERY: There is a minimal amount intimal thickening/atherosclerotic plaque  involving the left carotid bulb. There is a large amount of echogenic irregular atherosclerotic plaque throughout the left common carotid artery, resulting in elevated peak systolic velocities within the proximal and mid aspects of the left internal carotid artery. Greatest acquired peak systolic velocity within the mid left ICA measures 177 centimeters/second (image 79). LEFT VERTEBRAL ARTERY:  Antegrade flow IMPRESSION: 1. Large amount of right-sided atherosclerotic plaque results in elevated peak systolic velocities within the right internal carotid artery compatible with the greater than 70% luminal narrowing range. Further evaluation with CTA could be performed as clinically indicated. 2. Large amount of left-sided atherosclerotic plaque results in elevated peak systolic velocities within the left internal carotid artery compatible with the higher end of the 50-69% luminal narrowing range. 3. Antegrade flow demonstrated within the bilateral vertebral arteries. Electronically Signed   By: Sandi Mariscal M.D.   On: 07/13/2020 08:38   DG Chest Port 1 View  Result Date: 07/15/2020 CLINICAL DATA:  86 year old female with history of shortness of breath and weakness for the past 2 weeks. EXAM: PORTABLE CHEST 1 VIEW COMPARISON:  Chest x-ray 07/13/2020. FINDINGS: Low lung volumes. Bibasilar opacities which may reflect areas of atelectasis and/or consolidation. Small bilateral pleural effusions (left greater than right). Pulmonary venous congestion. Mild indistinctness of interstitial markings. Mild cardiomegaly. The patient is rotated to the left on today's exam, resulting in distortion of the mediastinal contours and reduced diagnostic sensitivity and specificity for mediastinal pathology. Aortic atherosclerosis. IMPRESSION: 1. The appearance the chest suggests mild congestive heart failure, with findings similar to the prior study, as above. 2. Aortic atherosclerosis. Electronically Signed   By: Vinnie Langton M.D.    On: 07/15/2020 10:02   DG Chest Portable 1 View  Result Date: 07/13/2020 CLINICAL DATA:  Dyspnea EXAM: PORTABLE CHEST 1 VIEW COMPARISON:  07/15/2018 FINDINGS: Small left pleural effusion. Moderate cardiomegaly. No overt pulmonary edema or focal airspace consolidation. IMPRESSION: Cardiomegaly and small left pleural effusion. Electronically Signed   By: Ulyses Jarred M.D.   On: 07/13/2020 00:21   ECHOCARDIOGRAM COMPLETE  Result Date: 07/14/2020    ECHOCARDIOGRAM REPORT   Patient Name:   ASIA DUSENBURY Date of Exam: 07/13/2020 Medical Rec #:  [id number]      Height:       59.0 in Accession #:    [id number]     Weight:       162.0 lb Date of Birth:  11-29-1930      BSA:          1.686 m Patient Age:    86 years  BP:           148/72 mmHg Patient Gender: F              HR:           109 bpm. Exam Location:  [facility] Procedure: 2D Echo Indications:     SYNCOPE 780.2/R55  History:         Patient has prior history of Echocardiogram examinations, most                  recent 07/13/2018. CAD, Stroke; Risk Factors:Hypertension.  Sonographer:     Avanell Shackleton Referring Phys:  [id number] Athena Masse Diagnosing Phys: Bartholome Bill MD IMPRESSIONS  1. Left ventricular ejection fraction, by estimation, is 55 to 60%. The left ventricle has normal function. The left ventricle has no regional wall motion abnormalities. Left ventricular diastolic parameters are consistent with Grade I diastolic dysfunction (impaired relaxation).  2. Right ventricular systolic function is normal. The right ventricular size is normal.  3. The mitral valve is grossly normal. Mild mitral valve regurgitation.  4. The aortic valve is grossly normal. Aortic valve regurgitation is not visualized. FINDINGS  Left Ventricle: Left ventricular ejection fraction, by estimation, is 55 to 60%. The left ventricle has normal function. The left ventricle has no regional wall motion abnormalities. The left ventricular internal cavity size was normal in size.  There is  borderline left ventricular hypertrophy. Left ventricular diastolic parameters are consistent with Grade I diastolic dysfunction (impaired relaxation). Right Ventricle: The right ventricular size is normal. No increase in right ventricular wall thickness. Right ventricular systolic function is normal. Left Atrium: Left atrial size was normal in size. Right Atrium: Right atrial size was normal in size. Pericardium: There is no evidence of pericardial effusion. Mitral Valve: The mitral valve is grossly normal. Mild mitral valve regurgitation. Tricuspid Valve: The tricuspid valve is grossly normal. Tricuspid valve regurgitation is mild. Aortic Valve: The aortic valve is grossly normal. Aortic valve regurgitation is not visualized. Pulmonic Valve: The pulmonic valve was not well visualized. Pulmonic valve regurgitation is trivial. Aorta: The aortic root is normal in size and structure. IAS/Shunts: The interatrial septum was not assessed.  LEFT VENTRICLE PLAX 2D LVIDd:         5.07 cm  Diastology LVIDs:         3.11 cm  LV e' medial:    7.72 cm/s LV PW:         1.04 cm  LV E/e' medial:  16.5 LV IVS:        1.02 cm  LV e' lateral:   7.07 cm/s LVOT diam:     2.10 cm  LV E/e' lateral: 18.0 LVOT Area:     3.46 cm  RIGHT VENTRICLE         IVC TAPSE (M-mode): 3.0 cm  IVC diam: 2.02 cm LEFT ATRIUM             Index       RIGHT ATRIUM           Index LA diam:        3.80 cm 2.25 cm/m  RA Area:     18.00 cm LA Vol (A2C):   68.4 ml 40.56 ml/m RA Volume:   46.20 ml  27.40 ml/m LA Vol (A4C):   74.9 ml 44.42 ml/m LA Biplane Vol: 74.5 ml 44.18 ml/m   AORTA Ao Root diam: 3.20 cm MITRAL VALVE  TRICUSPID VALVE MV Area (PHT): 7.09 cm     TR Peak grad:   63.7 mmHg MV Decel Time: 107 msec     TR Vmax:        399.00 cm/s MV E velocity: 127.00 cm/s MV A velocity: 147.00 cm/s  SHUNTS MV E/A ratio:  0.86         Systemic Diam: 2.10 cm Bartholome Bill MD Electronically signed by Bartholome Bill MD Signature Date/Time:  07/14/2020/8:45:55 AM    Final    CT Angio Abd/Pel w/ and/or w/o  Result Date: 07/13/2020 CLINICAL DATA:  Bilateral lower extremity edema.  GI bleed. EXAM: CTA ABDOMEN AND PELVIS WITHOUT AND WITH CONTRAST TECHNIQUE: Multidetector CT imaging of the abdomen and pelvis was performed using the standard protocol during bolus administration of intravenous contrast. Multiplanar reconstructed images and MIPs were obtained and reviewed to evaluate the vascular anatomy. CONTRAST:  154mL OMNIPAQUE IOHEXOL 350 MG/ML SOLN, 140mL OMNIPAQUE IOHEXOL 350 MG/ML SOLN COMPARISON:  07/15/2018 FINDINGS: VASCULAR Aorta: Aneurysmal dilatation of the infrarenal abdominal aorta measures 3.1 cm, image 57/4. Aortic atherosclerosis. No signs of dissection, vasculitis or stenosis. Celiac: Calcified plaque noted at the origin of the celiac trunk. No significant stenosis identified. SMA: Calcified plaque identified within the proximal superior mesenteric artery with approximately 60% stenosis. Renals: Mild narrowing at the origin of the left renal artery with less than 15% stenosis. Calcification at the origin of the right renal artery is noted with approximately 15% stenosis. IMA: Patent without evidence of aneurysm, dissection, vasculitis or significant stenosis. Inflow: Patent without evidence of aneurysm, dissection, vasculitis or significant stenosis. Proximal Outflow: Bilateral common femoral and visualized portions of the superficial and profunda femoral arteries are patent without evidence of aneurysm, dissection, vasculitis or significant stenosis. Veins: No obvious venous abnormality within the limitations of this arterial phase study. Review of the MIP images confirms the above findings. NON-VASCULAR Lower chest: Small bilateral pleural effusions with overlying compressive type atelectasis. Hepatobiliary: Low-density structure within posteroinferior right lobe of liver measures 9 mm and is too small to characterize. No suspicious  liver abnormality. Previous cholecystectomy. Mild increase caliber of the common bile duct is noted which measures up to 9 mm compatible with post cholecystectomy physiology. Pancreas: Unremarkable. No pancreatic ductal dilatation or surrounding inflammatory changes. Spleen: Normal in size without focal abnormality. Adrenals/Urinary Tract: Normal appearance of the adrenal glands. Bilateral kidney cysts are identified. The largest arises from the lateral cortex of the right mid kidney measuring 5.2 cm. No signs of hydronephrosis bilaterally. Urinary bladder is unremarkable. Stomach/Bowel: Moderate to large hiatal hernia. There is no bowel wall thickening, inflammation or distension. No signs of contrast extravasation within the bowel loops to indicate site of GI bleed. Extensive sigmoid diverticulosis. No acute inflammation. Lymphatic: No abdominopelvic adenopathy. Reproductive: Status post hysterectomy. No adnexal masses. Other: No free fluid or fluid collections identified. There is a periumbilical hernia which contains fat and a loop of nonobstructive transverse colon. Musculoskeletal: No acute or significant osseous findings. IMPRESSION: 1. No acute findings within the abdomen or pelvis. No signs of contrast extravasation within the bowel loops to indicate site of GI bleed. 2. Aortic atherosclerosis. There is a 3.1 cm infrarenal abdominal aortic aneurysm. Recommend follow-up every 3 years. this recommendation follows ACR consensus guidelines: White Paper of the ACR Incidental Findings Committee II on Vascular Findings. J Am Coll Radiol 2013; 10:789-794. 3. Bilateral kidney cysts. 4. Hiatal hernia. 5. Small bilateral pleural effusions with overlying compressive type atelectasis. 6. Periumbilical hernia contains fat  and a loop of nonobstructive transverse colon. Aortic Atherosclerosis (ICD10-I70.0). Aortic aneurysm NOS (ICD10-I71.9). Electronically Signed   By: Kerby Moors M.D.   On: 07/13/2020 06:57     SIGNED: Deatra James, MD, FACP, FHM. [facility],  Pager (please use [email or web address] to page/text)  If 7PM-7AM, please contact night-coverage [email or web address], Password [facility] 07/15/2020, 11:00 AM

## 2020-07-15 NOTE — Progress Notes (Signed)
Physical Therapy Treatment Patient Details Name: Ashley [name] MRN: [id number] DOB: Jun 11, 1931 Today's Date: 07/15/2020    History of Present Illness Per MD note:Ashley [name] is a 86 y.o. female with medical history significant for CAD, COPD, HTN who presents to the emergency room with altered mental status x2 days.  Most of the history is taken from the son who states that he noticed that his mother was lethargic and not her usual self and appeared to not recognize him and his symptoms worsened into today prompting him to bring her into the emergency room.  He states that she had been complaining of weakness for the past 2 weeks which she attributed to her age.  She saw her PCP 3 weeks prior and had blood work done and also recently saw her cardiologist.  He also states that she has been having hip pain and has been seeing the orthopedist and taking Tylenol but no NSAIDs for her pain.  She has had no cough, fever chills or shortness of breath.  No reports of nausea vomiting abdominal pain or change in bowel habits and no dysuria    PT Comments    Pt in bed on arrival to room with daughter bedside and is agreeable to PT. Pt performed supine exercises in bed before sitting EOB. Pt requires CGA and VCs utilized to bring hips forward so feet are touching the ground. Pt did some more exercises sitting before standing up. She is able to push up from the bed without any assistance with no elevated bed. While standing using RW, pt is able to perform hip marches without any LOB. However, her steps lengths are decreased and pt is out of breath. She ambulated to the recliner chair with verbal cues to stay inside the RW. Pt has good eccentric control during sit to stand transfer and performed 5 reps with CGA. Once seated in recliner, pt performed more exercises and educated to continue throughout her day. Pt is a little impulsive, as she moves before it safe to. Pt left in chair with all needs. She will continue  to benefit from skilled PT in order to increase strength, endurance, and mobility.  Follow Up Recommendations  SNF     Equipment Recommendations  None recommended by PT    Recommendations for Other Services       Precautions / Restrictions Precautions Precautions: Fall Restrictions Weight Bearing Restrictions: No    Mobility  Bed Mobility Overal bed mobility: Needs Assistance Bed Mobility: Supine to Sit     Supine to sit: Min guard;HOB elevated     General bed mobility comments: Pt able to sit EOB with UE support with increased time; VCs utilized for proper hand placement  Transfers Overall transfer level: Needs assistance Equipment used: Rolling walker (2 wheeled) Transfers: Sit to/from Stand Sit to Stand: Min guard         General transfer comment: Pt able to stand pushing off from bed with CGA assistance for safety  Ambulation/Gait Ambulation/Gait assistance: Min guard Gait Distance (Feet): 3 Feet Assistive device: Rolling walker (2 wheeled) Gait Pattern/deviations: Step-through pattern;Decreased step length - right;Decreased step length - left     General Gait Details: Pt walked to the chair from EOB with RW. VCs utilized for proper mechanics with RW   Stairs             Wheelchair Mobility    Modified Rankin (Stroke Patients Only)       Balance Overall balance assessment: Needs  assistance Sitting-balance support: No upper extremity supported;Feet supported;Bilateral upper extremity supported Sitting balance-Leahy Scale: Good Sitting balance - Comments: Pt able to maintain good balance seated at EOB, did not need any UE support just sitting, but during exercises utilized BUE support on bed Postural control: Other (comment) Standing balance support: Bilateral upper extremity supported Standing balance-Leahy Scale: Fair Standing balance comment: Pt able to maintain balance with UE support on walker                             Cognition Arousal/Alertness: Awake/alert Behavior During Therapy: WFL for tasks assessed/performed Overall Cognitive Status: History of cognitive impairments - at baseline                                 General Comments: Pt processes instructions slowly, but understands after some time      Exercises General Exercises - Upper Extremity Elbow Flexion: Strengthening;Both;10 reps;Seated (manual resistance) Elbow Extension: Strengthening;10 reps;Seated;Both (manual resistance) General Exercises - Lower Extremity Ankle Circles/Pumps: AROM;20 reps;Seated;Supine;Both Short Arc Quad: AROM;10 reps;Seated;Both Heel Slides: AROM;5 reps;Both;Supine Hip Flexion/Marching: AROM;Supine;Both;20 reps;Seated;Standing Other Exercises Other Exercises: sit to stand x 5 in recliner with CGA    General Comments        Pertinent Vitals/Pain Pain Assessment: No/denies pain    Home Living                      Prior Function            PT Goals (current goals can now be found in the care plan section) Acute Rehab PT Goals Patient Stated Goal: to walk PT Goal Formulation: Patient unable to participate in goal setting Time For Goal Achievement: 07/27/20 Potential to Achieve Goals: Good Progress towards PT goals: Progressing toward goals    Frequency    7X/week      PT Plan Frequency needs to be updated    Co-evaluation              AM-PAC PT "6 Clicks" Mobility   Outcome Measure  Help needed turning from your back to your side while in a flat bed without using bedrails?: A Little Help needed moving from lying on your back to sitting on the side of a flat bed without using bedrails?: A Little Help needed moving to and from a bed to a chair (including a wheelchair)?: A Little Help needed standing up from a chair using your arms (e.g., wheelchair or bedside chair)?: A Little Help needed to walk in hospital room?: A Little Help needed climbing 3-5 steps with  a railing? : A Lot 6 Click Score: 17    End of Session Equipment Utilized During Treatment: Gait belt;Oxygen Activity Tolerance: Patient tolerated treatment well Patient left: in chair;with call bell/phone within reach;with chair alarm set;with family/visitor present Nurse Communication: Mobility status PT Visit Diagnosis: Muscle weakness (generalized) (M62.81);Difficulty in walking, not elsewhere classified (R26.2)     Time: 8546-2703 PT Time Calculation (min) (ACUTE ONLY): 31 min  Charges:  $Gait Training: 8-22 mins $Therapeutic Exercise: 8-22 mins                       Kimmie Jacelyn Grip, SPT Bernita Raisin 07/15/2020, 3:16 PM

## 2020-07-15 NOTE — Progress Notes (Signed)
Initial Nutrition Assessment  DOCUMENTATION CODES:   Obesity unspecified  INTERVENTION:  Provide Ensure Enlive po BID, each supplement provides 350 kcal and 20 grams of protein.  NUTRITION DIAGNOSIS:   Inadequate oral intake related to decreased appetite as evidenced by per patient/family report.  GOAL:   Patient will meet greater than or equal to 90% of their needs  MONITOR:   PO intake, Supplement acceptance, Labs, Weight trends, I & O's  REASON FOR ASSESSMENT:   Malnutrition Screening Tool    ASSESSMENT:   86 year old female with PMHx of CAD, HTN, anemia, COPD, hx MI, anxiety, GERD, arthritis admitted with right occipital lobe infarct, acute on chronic blood loss anemia.   Met with patient and her daughter at bedside. Patient reports she has had a decreased appetite for a few days now. She reports at home she typically eats a good breakfast. She receives Meals on Wheels. She reports her dinner is small and may be a pack of crackers. Patient reports she is starting to be more hungry now. She was just starting to eat breakfast at time of RD assessment this morning. Per chart patient finished 90% of her lunch tray today. Patient is amenable to drinking Ensure Enlive to help meet calorie/protein needs.  Patient reports she is unsure of her UBW or weight trend but feels she is losing weight as her pants are fitting looser now. Per review fo chart patient was 76.7 kg on 07/27/2018. She is now documented to be 73.5 kg (162 lbs). Weight loss not significant for time frame.  Medications reviewed and include: Colace 100 mg BID, ferrous sulfate 325 mg BID, vitamin B12 1000 micrograms daily.  Labs reviewed.  Discussed with RN.  NUTRITION - FOCUSED PHYSICAL EXAM:    Most Recent Value  Orbital Region No depletion  Upper Arm Region No depletion  Thoracic and Lumbar Region No depletion  Buccal Region No depletion  Temple Region No depletion  Clavicle Bone Region No depletion   Clavicle and Acromion Bone Region No depletion  Scapular Bone Region No depletion  Dorsal Hand No depletion  Patellar Region No depletion  Anterior Thigh Region No depletion  Posterior Calf Region No depletion  Edema (RD Assessment) Mild  Hair Reviewed  Eyes Reviewed  Mouth Reviewed  Skin Reviewed  Nails Reviewed     Diet Order:   Diet Order            Diet Heart Room service appropriate? Yes; Fluid consistency: Thin  Diet effective now                EDUCATION NEEDS:   No education needs have been identified at this time  Skin:  Skin Assessment: Reviewed RN Assessment  Last BM:  Unknown  Height:   Ht Readings from Last 1 Encounters:  07/12/20 4' 11"  (1.499 m)   Weight:   Wt Readings from Last 1 Encounters:  07/12/20 73.5 kg   BMI:  Body mass index is 32.72 kg/m.  Estimated Nutritional Needs:   Kcal:  1700-1900  Protein:  85-95 grams  Fluid:  1.7-1.9 L/day  Jacklynn Barnacle, MS, RD, LDN Pager number available on Amion

## 2020-07-15 NOTE — Progress Notes (Signed)
Occupational Therapy Treatment Patient Details Name: Ashley [name] MRN: [id number] DOB: 05-26-1931 Today's Date: 07/15/2020    History of present illness Per MD note:Ashley [name] is a 86 y.o. female with medical history significant for CAD, COPD, HTN who presents to the emergency room with altered mental status x2 days.  Most of the history is taken from the son who states that he noticed that his mother was lethargic and not her usual self and appeared to not recognize him and his symptoms worsened into today prompting him to bring her into the emergency room.  He states that she had been complaining of weakness for the past 2 weeks which she attributed to her age.  She saw her PCP 3 weeks prior and had blood work done and also recently saw her cardiologist.  He also states that she has been having hip pain and has been seeing the orthopedist and taking Tylenol but no NSAIDs for her pain.  She has had no cough, fever chills or shortness of breath.  No reports of nausea vomiting abdominal pain or change in bowel habits and no dysuria   OT comments  Pt seen for OT tx this date to f/u re: safety with ADLs/ADL mobility. Pt wanting to use restroom during OT session stating she needs to pee. Upon fxl mobility to restroom, pt has large loose BM. Pt requires MAX A with LB bathing/dressing, MIN A with UB bathing/dressing, pt requires MIN/MOD A with commode transfer with grab bar and RW and requires MOD A for thorough completion of peri care. Setup for seated hand hygiene. Pt able to perform fxl mobility back to chair with CGA/MIN A with RW. Of note: Pt on 2Lnc at 99%, on RA at rest, maintains >94%. With activity on RA, initailly maintains >90% with fxl mobility to restroom, but upon fxl mobility back to chair, drops to 88%, 2Lnc reapplied and within ~15 secs, sats resume to >94%. Chair alarm on and all needs met/within reach. Continue to anticipate that SNF is likely best discharge recommendation. Will continue  to follow per OT POC.     Follow Up Recommendations  SNF    Equipment Recommendations  Other (comment) (TBD)    Recommendations for Other Services      Precautions / Restrictions Precautions Precautions: Fall Restrictions Weight Bearing Restrictions: No       Mobility Bed Mobility Overal bed mobility: Needs Assistance Bed Mobility: Supine to Sit     Supine to sit: Min guard;HOB elevated     General bed mobility comments: pt up to chair pre/post session  Transfers Overall transfer level: Needs assistance Equipment used: Rolling walker (2 wheeled) Transfers: Sit to/from Stand Sit to Stand: Min assist;Mod assist         General transfer comment: MIN A from recliner as there are arm rests on both sides, but MIN/MOD A from regular height commode as grab bar only on L side.    Balance Overall balance assessment: Needs assistance Sitting-balance support: Feet supported Sitting balance-Leahy Scale: Good   Standing balance support: Bilateral upper extremity supported Standing balance-Leahy Scale: Fair Standing balance comment: requires UE support and CGA to MIN A with fxl mobility                           ADL either performed or assessed with clinical judgement   ADL Overall ADL's : Needs assistance/impaired     Grooming: Wash/dry hands;Set up;Sitting   Upper  Body Bathing: Minimal assistance   Lower Body Bathing: Moderate assistance;Maximal assistance;Sit to/from stand   Upper Body Dressing : Minimal assistance;Sitting   Lower Body Dressing: Maximal assistance;Sitting/lateral leans Lower Body Dressing Details (indicate cue type and reason): in attempt to don socks Toilet Transfer: Minimal assistance;Moderate assistance;Ambulation;RW;Regular Toilet;Cueing for safety Toilet Transfer Details (indicate cue type and reason): MOD cues for hand placement-use of grab bar Toileting- Clothing Manipulation and Hygiene: Moderate assistance Toileting -  Clothing Manipulation Details (indicate cue type and reason): pt initiates in sitting, requires MIN/MOD to CTS. In standing, requires MOD A for thorough completion of posterior peri care     Functional mobility during ADLs: Minimal assistance;Rolling walker;Cueing for safety       Vision Patient Visual Report: No change from baseline     Perception     Praxis      Cognition Arousal/Alertness: Awake/alert Behavior During Therapy: WFL for tasks assessed/performed;Impulsive Overall Cognitive Status: History of cognitive impairments - at baseline                                 General Comments: requires multimodal cueing, slow processing, requires increased time with one step cues. some impulsivity.        Exercises Other Exercises Other Exercises: Pt wanting to use restroom during OT session stating she needs to pee. Upon fxl mobility to restroom, pt has large loose BM. Pt requires MAX A with LB bathing/dressing, MIN A with UB bathing/dressing, pt requires MIN/MOD A with commode transfer with grab bar and RW and requires MOD A for thorough completion of peri care. Setup for seated hand hygiene. Pt able to perform fxl mobility back to chair with CGA/MIN A with RW, O2 sats dropped slightly to 88% on RA with fxl mobility, 2Lnc reapplied and resumed >94% within 15 seconds.   Shoulder Instructions       General Comments Pt on 2Lnc at 99%, on RA at rest, maintains >94%. With activity on RA, initailly maintains >90% with fxl mobility to restroom, but upon fxl mobility back to chair, drops to 88%, 2Lnc reapplied and within ~15 secs, sats resume to >94%.    Pertinent Vitals/ Pain       Pain Assessment: No/denies pain  Home Living                                          Prior Functioning/Environment              Frequency  Min 1X/week        Progress Toward Goals  OT Goals(current goals can now be found in the care plan section)  Progress  towards OT goals: Progressing toward goals  Acute Rehab OT Goals Patient Stated Goal: to walk OT Goal Formulation: With patient/family Time For Goal Achievement: 07/28/20 Potential to Achieve Goals: Good  Plan Discharge plan remains appropriate    Co-evaluation                 AM-PAC OT "6 Clicks" Daily Activity     Outcome Measure   Help from another person eating meals?: A Little Help from another person taking care of personal grooming?: A Little Help from another person toileting, which includes using toliet, bedpan, or urinal?: A Lot Help from another person bathing (including washing, rinsing, drying)?: A Lot Help from  another person to put on and taking off regular upper body clothing?: A Little Help from another person to put on and taking off regular lower body clothing?: A Lot 6 Click Score: 15    End of Session Equipment Utilized During Treatment: Gait belt;Rolling walker;Oxygen  OT Visit Diagnosis: Other abnormalities of gait and mobility (R26.89)   Activity Tolerance Patient tolerated treatment well   Patient Left with call bell/phone within reach;in chair;with chair alarm set;with family/visitor present   Nurse Communication Mobility status;Other (comment) (notified CNA about needing EVS to room to sanitize and pt requiring pure-wick replacement.)        Time: 7127-8718 OT Time Calculation (min): 38 min  Charges: OT General Charges $OT Visit: 1 Visit OT Treatments $Self Care/Home Management : 23-37 mins $Therapeutic Activity: 8-22 mins  Gerrianne Scale, [place], OTR/L ascom [phone] 07/15/20, 5:09 PM

## 2020-07-15 NOTE — Consult Note (Signed)
Consultation Note Date: 07/15/2020   Patient Name: Ashley [name]  DOB: 11-Jul-1931  MRN: [id number]  Age / Sex: 86 y.o., female   PCP: Jaclyn Shaggy, MD Referring Physician: Kendell Bane, MD   REASON FOR CONSULTATION:Establishing goals of care  Palliative Care consult requested for goals of care discussion in this 86 y.o. female with multiple medical problems including COPD, hypertension, anemia, GERD, GI bleed, anxiety, CAD, PVD, and MI. She presented to ED after family noticed patient with altered mental status and lethargy. During ED work-up hemoglobin 4.7. Stools guaiac positive.Chest x-ray showed no acute findings. Head CT showed right occipital lobe consistent with acute or subacute infarct. Patient received 2uPRBC. Patient is being followed by Neurology and GI.    Clinical Assessment and Goals of Care: I have reviewed medical records including lab results, imaging, Epic notes, and MAR, received report from the bedside RN, and assessed the patient. I met at the bedside with Ashley [name] and her daughter, Ashley [name]  to discuss diagnosis prognosis, GOC, EOL wishes, disposition and options.  I introduced Palliative Medicine as specialized medical care for people living with serious illness. It focuses on providing relief from the symptoms and stress of a serious illness. The goal is to improve quality of life for both the patient and the family. Patient and daughter verbalized understanding and appreciation of support.   Ashley [name] is sitting up in the recliner. No acute distress. Denies pain or shortness of breath. She is alert and oriented x3. Request to use bathroom. Observed patient ambulating to bathroom with walker assist without complications.   Dr. Servando Snare also at bedside providing updates.   We discussed a brief life review of the patient, along with her functional and nutritional status.  Patient reports she has 6 children.  She is widowed and a retired Education officer, environmental.  She  lives alone in her home with occasional assistance from her children and neighbors.  Patient is a Investment banker, operational.  Enjoys sitting out on the porch and waving at neighbors.   Prior to admission family reports patient ambulatory with occasional use of cane or walker.  She was able to fix breakfast.  Appetite was good.  Patient reports she received Meals on Wheels assistance in addition to her family providing groceries and other necessary items.  She has a lifeline in the home for emergencies.  Patient had no concerns regarding her mental state.  Daughter reports patient fixes her own pillbox weekly with some assistance from the local pharmacy when new prescriptions are filled.  Patient has previously attempted assisted living facilities however did not like the care and chose to return home.  We discussed Her current illness and what it means in the larger context of Her on-going co-morbidities. With specific discussions regarding bleed, recent CVA, and overall functional status.  Natural disease trajectory and expectations at EOL were discussed.  Patient and daughter verbalized understanding of her current illness and comorbidities.  They have received extensive updates from neurology and GI.  They understand the risk of proceeding with medical interventions such as colonoscopy.  At this time they are remaining hopeful for less invasive medical interventions to manage symptoms with hopes of improvement/stability.  I attempted to elicit values and goals of care important to the patient.    The difference between aggressive medical intervention and comfort care was considered in light of the patient's goals of care.  At this time patient and family remains hopeful for improvement.  Patient is clear with family support that she would like to continue to receive medical treatment however would not want to risk further complications or death by undergoing aggressive medical interventions at this time.  They  are anticipating patient will discharge to SNF for rehab with a goal of returning home eventually.  We discussed the possibility of a new baseline and patient possibly requiring some assistance from home health after rehab.  Family is aware facility will provide further guidance throughout her rehabilitation process.  Family and patient reports previous perception of a good quality of life.  They are hopeful this will continue for patient for some time however are very much realistic given her age and comorbidities that things can drastically change at any time.  Patient reports her main focus is to continue to receive care, to be happy, and not in a suffering state with daughter expresses full agreement.  If at any point patient is condition was to drastically decline they would then consider her quality of life with a focus on comfort and happiness at that time.  Advanced directives, concepts specific to code status, artifical feeding and hydration, and rehospitalization were considered and discussed. Patient does not have a documented advanced directive.  Family and patient confirms DNR/DNI.  They have requested for advanced directive documentation including MOST form to review as a family and further discuss.  Documents given to daughter and reviewed.  Encourage patient and family if they chose to have documentation completed while hospitalized to let myself or the nursing staff know and this could be arranged on patient's behalf.  Hospice and Palliative Care services outpatient were explained and offered. Patient and family verbalized their understanding and awareness of both palliative and hospice's goals and philosophy of care.  At this time with expressed goals of continuing medical interventions in hopes for improvement family is in agreement with outpatient palliative care services as recommended.  Questions and concerns were addressed.  Hard Choices booklet left for review. The family was  encouraged to call with questions or concerns.  PMT will continue to support holistically.   SOCIAL HISTORY:     reports that she quit smoking about 23 years ago. She has never used smokeless tobacco. She reports that she does not drink alcohol and does not use drugs.  CODE STATUS: DNR  ADVANCE DIRECTIVES: Primary Decision Maker: Children   SYMPTOM MANAGEMENT: Per attending  Palliative Prophylaxis:   Bowel Regimen and Frequent Pain Assessment  PSYCHO-SOCIAL/SPIRITUAL:  Support System: Family  Desire for further Chaplaincy support: No  Additional Recommendations (Limitations, Scope, Preferences):  Full Scope Treatment  Education on hospice/palliative    PAST MEDICAL HISTORY: Past Medical History:  Diagnosis Date  . Anemia   . Anxiety   . Arthritis   . COPD (chronic obstructive pulmonary disease) (HCC)   . Coronary artery disease   . Edema    feet/ankles   wears support hose  . GERD (gastroesophageal reflux disease)   . GI (gastrointestinal bleed)    history  . Hypertension   . Myocardial infarction (HCC)   . Peripheral vascular disease (HCC)   . Shortness of breath dyspnea     ALLERGIES:  has No Known Allergies.   MEDICATIONS:  Current Facility-Administered Medications  Medication Dose Route Frequency Provider Last Rate Last Admin  .  stroke: mapping our early stages of recovery book   Does not apply Once Andris Baumann, MD      . acetaminophen (TYLENOL) tablet 650 mg  650 mg Oral Q4H PRN Andris Baumann, MD       Or  . acetaminophen (TYLENOL) 160 MG/5ML solution 650 mg  650 mg Per Tube Q4H PRN Andris Baumann, MD       Or  . acetaminophen (TYLENOL) suppository 650 mg  650 mg Rectal Q4H PRN Andris Baumann, MD   650 mg at 07/13/20 1142  . ALPRAZolam Prudy Feeler) tablet 0.25 mg  0.25 mg Oral TID PRN Nevin Bloodgood A, MD   0.25 mg at 07/14/20 2138  . ALPRAZolam Prudy Feeler) tablet 0.25 mg  0.25 mg Oral BID Nevin Bloodgood A, MD   0.25 mg at 07/15/20 0947  .  artificial tears (LACRILUBE) ophthalmic ointment   Both Eyes Q8H Kendell Bane, MD   Given at 07/15/20 0509  . docusate sodium (COLACE) capsule 100 mg  100 mg Oral BID Shahmehdi, Seyed A, MD   100 mg at 07/15/20 0946  . feeding supplement (ENSURE ENLIVE) (ENSURE ENLIVE) liquid 237 mL  237 mL Oral BID BM Shahmehdi, Seyed A, MD      . ferrous sulfate tablet 325 mg  325 mg Oral BID WC Shahmehdi, Seyed A, MD   325 mg at 07/15/20 0946  . gadobutrol (GADAVIST) 1 MMOL/ML injection 7 mL  7 mL Intravenous Once PRN Erick Blinks, MD      . HYDROcodone-acetaminophen (NORCO/VICODIN) 5-325 MG per tablet 1 tablet  1 tablet Oral Q6H PRN Kendell Bane, MD   1 tablet at 07/14/20 2138  . Ipratropium-Albuterol (COMBIVENT) respimat 1 puff  1 puff Inhalation Q6H Shahmehdi, Seyed A, MD   1 puff at 07/15/20 1203  . isosorbide dinitrate (ISORDIL) tablet 30 mg  30 mg Oral BID Nevin Bloodgood A, MD   30 mg at 07/15/20 1202  . metoprolol succinate (TOPROL-XL) 24 hr tablet 75 mg  75 mg Oral Daily Shahmehdi, Seyed A, MD   75 mg at 07/15/20 0946  . simvastatin (ZOCOR) tablet 40 mg  40 mg Oral Daily Shahmehdi, Seyed A, MD   40 mg at 07/15/20 0946  . sodium chloride flush (NS) 0.9 % injection 10-40 mL  10-40 mL Intracatheter PRN Andris Baumann, MD      . vitamin B-12 (CYANOCOBALAMIN) tablet 1,000 mcg  1,000 mcg Oral Daily Shahmehdi, Seyed A, MD   1,000 mcg at 07/15/20 0946    VITAL SIGNS: BP 130/71 (BP Location: Right Arm)   Pulse 100   Temp 98.4 F (36.9 C) (Oral)   Resp 18   Ht 4\' 11"  (1.499 m)   Wt 73.5 kg   SpO2 97%   BMI 32.72 kg/m  Filed Weights   07/12/20 2243  Weight: 73.5 kg    Estimated body mass index is 32.72 kg/m as calculated from the following:   Height as of this encounter: 4\' 11"  (1.499 m).   Weight as of this encounter: 73.5 kg.  LABS: CBC:    Component Value Date/Time   WBC 8.5 07/15/2020 0648   HGB 8.5 (L) 07/15/2020 0648   HCT 30.1 (L) 07/15/2020 0648   PLT 165  07/15/2020 0648   Comprehensive Metabolic Panel:    Component Value Date/Time   NA 138 07/15/2020 0648   K 4.2 07/15/2020 0648   BUN 12 07/15/2020 0648   CREATININE 0.55 07/15/2020 0648   ALBUMIN 3.6 07/12/2020 2242     Review of Systems  Neurological: Positive for weakness.  All other systems reviewed and are negative. Unless otherwise noted, a complete review  of systems is negative.  Physical Exam General: NAD Cardiovascular: regular rate and rhythm Pulmonary: clear ant fields Abdomen: soft, nontender, + bowel sounds Extremities: no edema, no joint deformities, observed ambulating with walker without complications Skin: no rashes, warm and dry Neurological: Alert and oriented x3, mood appropriate   Prognosis: Unable to determine  Discharge Planning:  Skilled Nursing Facility for rehab with Palliative care service follow-up  Recommendations: . DNR/DNI-as confirmed by patient/family . Continue with current plan of care per medical team  . Patient and family remains hopeful for improvement.  Plan is for patient to discharge to SNF for rehabilitation with a goal of eventually returning home with home health assistance and or family support depending on progress during rehab. . Family is requesting outpatient palliative support at discharge.  Encouraged continued ongoing discussions regarding goals of care.  Advanced directive/MOST form provided to family as requested.  (TOC referral placed for outpatient palliative support at discharge) . PMT will continue to support and follow. Please call team line with urgent needs.   Palliative Performance Scale: PPS 30%              Patient and family expressed understanding and was in agreement with this plan.   Thank you for allowing the Palliative Medicine Team to assist in the care of this patient.  Time In: 1500 Time Out: 1555 Time Total:55 min.   Visit consisted of counseling and education dealing with the complex and  emotionally intense issues of symptom management and palliative care in the setting of serious and potentially life-threatening illness.Greater than 50%  of this time was spent counseling and coordinating care related to the above assessment and plan.  Signed by:  Willette Alma, AGPCNP-BC Palliative Medicine Team  Phone: [phone] Pager: [phone] Amion: Thea Alken

## 2020-07-15 NOTE — Progress Notes (Signed)
Ashley Lame, MD [facility]   [address] [place], [place] [address] Phone: [phone] Fax : [phone]   Subjective: The patient has had no complaints.  She has a stable hemoglobin.  No sign of any further bleeding.  I spoke to the neurologist yesterday about her risk of sedation and we agree that she is high risk for any endoscopic procedures.  She is sitting up in a chair today conversing in full sentences.   Objective: Vital signs in last 24 hours: Vitals:   07/14/20 1506 07/14/20 1929 07/14/20 2324 07/15/20 0802  BP: (!) 142/65 (!) 155/69 126/62 130/71  Pulse: 97 98 89 100  Resp: (!) 25 16 17 18   Temp: 98 F (36.7 C) 97.8 F (36.6 C) 98.4 F (36.9 C) 98.4 F (36.9 C)  TempSrc: Oral Oral Oral Oral  SpO2: 98% 96% 96% 97%  Weight:      Height:       Weight change:   Intake/Output Summary (Last 24 hours) at 07/15/2020 1529 Last data filed at 07/15/2020 1350 Gross per 24 hour  Intake 120 ml  Output 700 ml  Net -580 ml     Exam: General: No apparent distress sitting up in chair   Lab Results: @LABTEST2 @ Micro Results: Recent Results (from the past 240 hour(s))  SARS Coronavirus 2 by RT PCR (hospital order, performed in [place] hospital lab) Nasopharyngeal Nasopharyngeal Swab     Status: None   Collection Time: 07/13/20 12:09 AM   Specimen: Nasopharyngeal Swab  Result Value Ref Range Status   SARS Coronavirus 2 NEGATIVE NEGATIVE Final    Comment: (NOTE) SARS-CoV-2 target nucleic acids are NOT DETECTED.  The SARS-CoV-2 RNA is generally detectable in upper and lower respiratory specimens during the acute phase of infection. The lowest concentration of SARS-CoV-2 viral copies this assay can detect is 250 copies / mL. A negative result does not preclude SARS-CoV-2 infection and should not be used as the sole basis for treatment or other patient management decisions.  A negative result may occur with improper specimen collection / handling, submission  of specimen other than nasopharyngeal swab, presence of viral mutation(s) within the areas targeted by this assay, and inadequate number of viral copies (<250 copies / mL). A negative result must be combined with clinical observations, patient history, and epidemiological information.  Fact Sheet for Patients:   StrictlyIdeas.no  Fact Sheet for Healthcare Providers: BankingDealers.co.za  This test is not yet approved or  cleared by the [place] FDA and has been authorized for detection and/or diagnosis of SARS-CoV-2 by FDA under an Emergency Use Authorization (EUA).  This EUA will remain in effect (meaning this test can be used) for the duration of the COVID-19 declaration under Section 564(b)(1) of the Act, 21 U.S.C. section 360bbb-3(b)(1), unless the authorization is terminated or revoked sooner.  Performed at [facility], [address] [place], [place] [address]    Studies/Results: CT ANGIO HEAD W OR WO CONTRAST  Result Date: 07/14/2020 CLINICAL DATA:  Stroke EXAM: CT ANGIOGRAPHY HEAD AND NECK TECHNIQUE: Multidetector CT imaging of the head and neck was performed using the standard protocol during bolus administration of intravenous contrast. Multiplanar CT image reconstructions and MIPs were obtained to evaluate the vascular anatomy. Carotid stenosis measurements (when applicable) are obtained utilizing NASCET criteria, using the distal internal carotid diameter as the denominator. CONTRAST:  66mL OMNIPAQUE IOHEXOL 350 MG/ML SOLN COMPARISON:  CT head and MRI head 07/13/2020 FINDINGS: CT HEAD FINDINGS Brain: Hypodensity throughout  most of the right occipital lobe unchanged from recent CT and MRI compatible with acute right PCA infarct. No associated hemorrhage. Patchy white matter hypodensity bilaterally consistent with chronic microvascular ischemia of a moderate degree. Ventricle size normal. Negative for mass lesion.  Vascular: Negative for hyperdense vessel Skull: Negative Sinuses: Paranasal sinuses clear. Orbits: Bilateral cataract extraction.  No orbital mass. Review of the MIP images confirms the above findings CTA NECK FINDINGS Aortic arch: Atherosclerotic calcification aortic arch and proximal great vessels without stenosis or aneurysm. Tortuosity of the proximal great vessels noted. Right carotid system: Atherosclerotic disease right carotid bifurcation extending into the proximal right internal carotid artery. 40% diameter stenosis proximal right internal carotid artery. Left carotid system: Atherosclerotic calcification left carotid bifurcation without significant stenosis. Vertebral arteries: Moderate calcific stenosis at the origin of the right vertebral artery. Right vertebral artery is patent to the basilar without additional stenosis. Non dominant left vertebral artery without significant stenosis. Skeleton: No acute abnormality. Other neck: Negative for mass or adenopathy. Upper chest: Moderate right pleural effusion and small left effusion. Negative for mass or pneumonia in the lung apices. Review of the MIP images confirms the above findings CTA HEAD FINDINGS Anterior circulation: Atherosclerotic calcification throughout the cavernous carotid bilaterally without significant stenosis. 6 x 9 mm aneurysm of the right cavernous carotid proximal to the anterior genu. This projects medially. 3 mm aneurysm in the anterior genu of the left cavernous carotid in the ophthalmic artery region. Possible small blister aneurysm of the left internal carotid artery projecting medially as noted on MRA. Anterior and middle cerebral arteries patent bilaterally without significant stenosis. Posterior circulation: Both vertebral arteries patent to the basilar. Right vertebral artery dominant. PICA patent bilaterally. Basilar widely patent. AICA, superior cerebellar, and posterior cerebral arteries patent bilaterally without stenosis.  Right PCA widely patent in the setting of acute right PCA infarct. Venous sinuses: Normal venous enhancement. Anatomic variants: None Review of the MIP images confirms the above findings IMPRESSION: 1. Acute infarct right PCA territory unchanged. Negative for hemorrhage. Right PCA is patent. 2. Atherosclerotic disease right carotid bifurcation with 40% diameter stenosis proximal right internal carotid artery. Left carotid patent without significant stenosis 3. Moderate stenosis due to atherosclerotic calcification origin of right vertebral artery. Left vertebral artery patent without significant stenosis. 4. Bilateral cavernous carotid aneurysms as above. Electronically Signed   By: Franchot Gallo M.D.   On: 07/14/2020 11:46   CT ANGIO NECK W OR WO CONTRAST  Result Date: 07/14/2020 : CT angiogram neck reported under the CT angio head report and accession number Electronically Signed   By: Franchot Gallo M.D.   On: 07/14/2020 11:53   MR ANGIO HEAD WO CONTRAST  Result Date: 07/13/2020 CLINICAL DATA:  86 year old female with altered mental status. Increasing confusion x3 days. Acute to subacute appearing right PCA territory infarct on plain head CT earlier today. History of GI bleeding, and substantially low hemoglobin and hematocrit levels on presentation. Being transfused. EXAM: MRA HEAD WITHOUT CONTRAST TECHNIQUE: Angiographic images of the Circle of Willis were obtained using MRA technique without intravenous contrast. COMPARISON:  Neck MRA today reported separately. FINDINGS: Antegrade flow in the posterior circulation with dominant distal right vertebral artery. No distal vertebral stenosis. Patent left PICA origin. The right AICA appears to be dominant. No right PICA identified. Patent vertebrobasilar junction. Patent basilar artery without stenosis. Patent SCA and PCA origins, and bilateral PCA branches appear symmetric and within normal limits. Antegrade flow in both ICA siphons. Both cavernous ICA  segments appear abnormal, with a large roughly 10 mm saccular versus fusiform aneurysm of the cavernous right ICA directed medially. And there is a much smaller 3 mm saccular aneurysm of the cavernous left ICA directed superiorly and medially. See series 1, images 96 and 100. furthermore, there is evidence of a second small distal left ICA aneurysm, in the form of a 3-4 mm aneurysm directed medially from the left anterior genu (intradural). But no superimposed ICA stenosis is identified in both carotid termini are patent. Ophthalmic artery origins appear within normal limits. Posterior communicating arteries are diminutive or absent. Patent MCA and ACA origins. Anterior communicating artery and visible ACA branches are within normal limits. MCA M1 segments and bifurcations are patent without evidence of stenosis. Visible bilateral MCA branches are within normal limits. IMPRESSION: 1. Positive for multiple ICA aneurysms: - large, roughly 10 mm aneurysm of the cavernous Right ICA directed medially. - 4 mm Left ICA anterior genu aneurysm (intradural). - 3 mm cavernous Left ICA aneurysm. 2. Negative for large vessel occlusion or intracranial stenosis, the Right PCA is within normal limits. 3. Considering both the intracranial and extracranial MRA findings, a follow-up CTA Head and Neck with IV contrast is recommended when feasible. Electronically Signed   By: Genevie Ann M.D.   On: 07/13/2020 18:18   MR ANGIO NECK WO CONTRAST  Result Date: 07/13/2020 CLINICAL DATA:  86 year old female with altered mental status. Increasing confusion x3 days. Acute to subacute appearing right PCA territory infarct on plain head CT earlier today. History of GI bleeding, and substantially low hemoglobin and hematocrit levels on presentation. Being transfused. A study without and with contrast was planned but the patient's IV access infiltrated. EXAM: MRA NECK WITHOUT CONTRAST TECHNIQUE: Angiographic images of the neck were obtained using  MRA technique without intravenous contrast. Carotid stenosis measurements (when applicable) are obtained utilizing NASCET criteria, using the distal internal carotid diameter as the denominator. COMPARISON:  Brain MRI today. Carotid Doppler ultrasound earlier today. FINDINGS: Time-of-flight MRA images demonstrate antegrade flow in both cervical carotid and vertebral arteries to the skull base. Dominant appearing right vertebral artery, no evidence of vertebral artery stenosis in the visible V1 or V2 segments. Both carotid bifurcations also are patent, but there are no flow gaps to suggest a high-grade cervical ICA stenosis. However, there is irregularity and narrowing of the proximal right ICA (series 6, image 73), roughly estimated at 50 % with respect to the distal vessel on these images. Mild left ICA distal bulb irregularity without that degree of stenosis. IMPRESSION: Evidence of right greater than left ICA atherosclerosis, but time-of-flight MRA does NOT confirm a high-grade carotid stenosis in the neck. Follow-up CTA neck with IV contrast would best evaluate further as necessary. Electronically Signed   By: Genevie Ann M.D.   On: 07/13/2020 18:11   MR BRAIN WO CONTRAST  Result Date: 07/13/2020 CLINICAL DATA:  86 year old female with altered mental status. Increasing confusion x3 days. Acute to subacute appearing right PCA territory infarct on plain head CT earlier today. History of GI bleeding, and substantially low hemoglobin and hematocrit levels on presentation. Being transfused. EXAM: MRI HEAD WITHOUT CONTRAST TECHNIQUE: Multiplanar, multiecho pulse sequences of the brain and surrounding structures were obtained without intravenous contrast. COMPARISON:  Head CT 0224 hours today. FINDINGS: Brain: Confluent restricted diffusion throughout the right occipital lobe, posterior right temporal lobe including the right hippocampal formation. T2 and FLAIR hyperintense cytotoxic edema in the affected areas.  Minimal T1 hypointensity. The right thalamus is  spared. No acute intracranial hemorrhage identified. No significant intracranial mass effect. Additionally there is a small focus of patchy restricted diffusion in the right inferior cerebellum, PICA territory (series 9, image 10) with minimal T2 and FLAIR hyperintensity. No mass effect. No other restricted diffusion. Widely scattered moderate for age bilateral cerebral white matter T2 and FLAIR hyperintensity. But no chronic cortical encephalomalacia or chronic cerebral blood products are identified. Mild T2 heterogeneity in the deep gray nuclei, primarily the left thalamus. No midline shift, mass effect, evidence of mass lesion, ventriculomegaly, extra-axial collection or acute intracranial hemorrhage. Cervicomedullary junction and pituitary are within normal limits. Vascular: Major intracranial vascular flow voids are preserved. Mild intracranial artery tortuosity. Skull and upper cervical spine: Negative for age visible cervical spine. Mildly heterogeneous background bone marrow signal, also mildly abnormal on DWI. Sinuses/Orbits: Negative. Other: Scalp and face appear negative. IMPRESSION: 1. Confluent acute Right PCA territory infarct. Subtle acute infarct also in the cerebellum, Right PICA territory. No associated hemorrhage or mass effect. 2. Moderate for age chronic white matter signal changes likely due to small vessel disease. 3. Heterogeneous bone marrow signal likely reflects red marrow reactivation in the setting of anemia. Electronically Signed   By: Genevie Ann M.D.   On: 07/13/2020 18:06   DG Chest Port 1 View  Result Date: 07/15/2020 CLINICAL DATA:  86 year old female with history of shortness of breath and weakness for the past 2 weeks. EXAM: PORTABLE CHEST 1 VIEW COMPARISON:  Chest x-ray 07/13/2020. FINDINGS: Low lung volumes. Bibasilar opacities which may reflect areas of atelectasis and/or consolidation. Small bilateral pleural effusions (left  greater than right). Pulmonary venous congestion. Mild indistinctness of interstitial markings. Mild cardiomegaly. The patient is rotated to the left on today's exam, resulting in distortion of the mediastinal contours and reduced diagnostic sensitivity and specificity for mediastinal pathology. Aortic atherosclerosis. IMPRESSION: 1. The appearance the chest suggests mild congestive heart failure, with findings similar to the prior study, as above. 2. Aortic atherosclerosis. Electronically Signed   By: Vinnie Langton M.D.   On: 07/15/2020 10:02   ECHOCARDIOGRAM COMPLETE  Result Date: 07/14/2020    ECHOCARDIOGRAM REPORT   Patient Name:   LARISSA PEGG Date of Exam: 07/13/2020 Medical Rec #:  [id number]      Height:       59.0 in Accession #:    [id number]     Weight:       162.0 lb Date of Birth:  1931/10/13      BSA:          1.686 m Patient Age:    79 years       BP:           148/72 mmHg Patient Gender: F              HR:           109 bpm. Exam Location:  [facility] Procedure: 2D Echo Indications:     SYNCOPE 780.2/R55  History:         Patient has prior history of Echocardiogram examinations, most                  recent 07/13/2018. CAD, Stroke; Risk Factors:Hypertension.  Sonographer:     Avanell Shackleton Referring Phys:  [id number] Athena Masse Diagnosing Phys: Bartholome Bill MD IMPRESSIONS  1. Left ventricular ejection fraction, by estimation, is 55 to 60%. The left ventricle has normal function. The left ventricle has no regional wall motion abnormalities. Left ventricular diastolic  parameters are consistent with Grade I diastolic dysfunction (impaired relaxation).  2. Right ventricular systolic function is normal. The right ventricular size is normal.  3. The mitral valve is grossly normal. Mild mitral valve regurgitation.  4. The aortic valve is grossly normal. Aortic valve regurgitation is not visualized. FINDINGS  Left Ventricle: Left ventricular ejection fraction, by estimation, is 55 to 60%. The left  ventricle has normal function. The left ventricle has no regional wall motion abnormalities. The left ventricular internal cavity size was normal in size. There is  borderline left ventricular hypertrophy. Left ventricular diastolic parameters are consistent with Grade I diastolic dysfunction (impaired relaxation). Right Ventricle: The right ventricular size is normal. No increase in right ventricular wall thickness. Right ventricular systolic function is normal. Left Atrium: Left atrial size was normal in size. Right Atrium: Right atrial size was normal in size. Pericardium: There is no evidence of pericardial effusion. Mitral Valve: The mitral valve is grossly normal. Mild mitral valve regurgitation. Tricuspid Valve: The tricuspid valve is grossly normal. Tricuspid valve regurgitation is mild. Aortic Valve: The aortic valve is grossly normal. Aortic valve regurgitation is not visualized. Pulmonic Valve: The pulmonic valve was not well visualized. Pulmonic valve regurgitation is trivial. Aorta: The aortic root is normal in size and structure. IAS/Shunts: The interatrial septum was not assessed.  LEFT VENTRICLE PLAX 2D LVIDd:         5.07 cm  Diastology LVIDs:         3.11 cm  LV e' medial:    7.72 cm/s LV PW:         1.04 cm  LV E/e' medial:  16.5 LV IVS:        1.02 cm  LV e' lateral:   7.07 cm/s LVOT diam:     2.10 cm  LV E/e' lateral: 18.0 LVOT Area:     3.46 cm  RIGHT VENTRICLE         IVC TAPSE (M-mode): 3.0 cm  IVC diam: 2.02 cm LEFT ATRIUM             Index       RIGHT ATRIUM           Index LA diam:        3.80 cm 2.25 cm/m  RA Area:     18.00 cm LA Vol (A2C):   68.4 ml 40.56 ml/m RA Volume:   46.20 ml  27.40 ml/m LA Vol (A4C):   74.9 ml 44.42 ml/m LA Biplane Vol: 74.5 ml 44.18 ml/m   AORTA Ao Root diam: 3.20 cm MITRAL VALVE                TRICUSPID VALVE MV Area (PHT): 7.09 cm     TR Peak grad:   63.7 mmHg MV Decel Time: 107 msec     TR Vmax:        399.00 cm/s MV E velocity: 127.00 cm/s MV A  velocity: 147.00 cm/s  SHUNTS MV E/A ratio:  0.86         Systemic Diam: 2.10 cm Bartholome Bill MD Electronically signed by Bartholome Bill MD Signature Date/Time: 07/14/2020/8:45:55 AM    Final    Medications: I have reviewed the patient's current medications. Scheduled Meds: .  stroke: mapping our early stages of recovery book   Does not apply Once  . ALPRAZolam  0.25 mg Oral BID  . artificial tears   Both Eyes Q8H  . docusate sodium  100 mg Oral BID  .  feeding supplement (ENSURE ENLIVE)  237 mL Oral BID BM  . ferrous sulfate  325 mg Oral BID WC  . Ipratropium-Albuterol  1 puff Inhalation Q6H  . isosorbide dinitrate  30 mg Oral BID  . metoprolol succinate  75 mg Oral Daily  . simvastatin  40 mg Oral Daily  . vitamin B-12  1,000 mcg Oral Daily   Continuous Infusions: PRN Meds:.acetaminophen **OR** acetaminophen (TYLENOL) oral liquid 160 mg/5 mL **OR** acetaminophen, ALPRAZolam, gadobutrol, HYDROcodone-acetaminophen, sodium chloride flush   Assessment: Principal Problem:   Acute CVA (cerebrovascular accident) ([place]) Active Problems:   Coronary artery disease   Benign hypertension   Acute on chronic blood loss anemia   Severe Symptomatic anemia   Acute metabolic encephalopathy   COPD (chronic obstructive pulmonary disease) ([place])   Stroke (cerebrum) ([place])    Plan: This patient had a CVA with a finding of anemia.  The patient's hemoglobin is stable at this time and there is been no sign of any GI bleeding. The patient likely has anemia from Chronic blood loss.  Nothing further to do from a GI point of view until she is out of the woods from her CVA which may be a few weeks. The patient should be treated with iron and with transfusions as needed. Have the patient follow up as an outpatient.  I will sign off.  Please call if any further GI concerns or questions.  We would like to thank you for the opportunity to participate in the care of Ashley [name].     LOS: 2 days   Lewayne Bunting 07/15/2020, 3:29 PM Pager [phone] 7am-5pm  Check AMION for 5pm -7am coverage and on weekends

## 2020-07-15 NOTE — TOC Progression Note (Addendum)
Transition of Care [facility]) - Progression Note    Patient Details  Name: ALAYNAH SCHUTTER MRN: [id number] Date of Birth: 1931-07-21  Transition of Care [facility]) CM/SW [place], RN Phone Number: 07/15/2020, 2:05 PM  Clinical Narrative:  LVMM for Magda Paganini @ [facility] updating family had requested [facility] for SNF transfer.   2:33pm Confirmed with [facility] patient accepted and anticipate discharge tomorrow.   Expected Discharge Plan: [place] Barriers to Discharge: Continued Medical Work up  Expected Discharge Plan and Services Expected Discharge Plan: [place]   Discharge Planning Services: CM Consult Post Acute Care Choice: [place] Living arrangements for the past 2 months: Single Family Home                           HH Arranged: NA           Social Determinants of Health (SDOH) Interventions    Readmission Risk Interventions No flowsheet data found.

## 2020-07-16 DIAGNOSIS — I639 Cerebral infarction, unspecified: Principal | ICD-10-CM

## 2020-07-16 LAB — BASIC METABOLIC PANEL
Anion gap: 7 (ref 5–15)
BUN: 20 mg/dL (ref 8–23)
CO2: 28 mmol/L (ref 22–32)
Calcium: 8.6 mg/dL — ABNORMAL LOW (ref 8.9–10.3)
Chloride: 106 mmol/L (ref 98–111)
Creatinine, Ser: 0.62 mg/dL (ref 0.44–1.00)
GFR calc Af Amer: >60 mL/min (ref >60)
GFR calc non Af Amer: >60 mL/min (ref >60)
Glucose, Bld: 114 mg/dL — ABNORMAL HIGH (ref 70–99)
Potassium: 4 mmol/L (ref 3.5–5.1)
Sodium: 141 mmol/L (ref 135–145)

## 2020-07-16 LAB — CBC
HCT: 28.1 % — ABNORMAL LOW (ref 36.0–46.0)
Hemoglobin: 7.8 g/dL — ABNORMAL LOW (ref 12.0–15.0)
MCH: 19.9 pg — ABNORMAL LOW (ref 26.0–34.0)
MCHC: 27.8 g/dL — ABNORMAL LOW (ref 30.0–36.0)
MCV: 71.9 fL — ABNORMAL LOW (ref 80.0–100.0)
Platelets: 156 10*3/uL (ref 150–400)
RBC: 3.91 MIL/uL (ref 3.87–5.11)
RDW: 24.7 % — ABNORMAL HIGH (ref 11.5–15.5)
WBC: 7.7 10*3/uL (ref 4.0–10.5)
nRBC: 0.3 % — ABNORMAL HIGH (ref 0.0–0.2)

## 2020-07-16 MED ORDER — IPRATROPIUM BROMIDE 0.02 % IN SOLN: 0.5000 mg | Freq: Four times a day (QID) | RESPIRATORY_TRACT | Status: DC | PRN

## 2020-07-16 MED ORDER — FUROSEMIDE 40 MG PO TABS
40.0000 mg | ORAL_TABLET | Freq: Once | ORAL | Status: AC
  Administered 2020-07-16: 40 mg via ORAL
  Filled 2020-07-16: qty 1

## 2020-07-16 MED ORDER — LEVALBUTEROL HCL 0.63 MG/3ML IN NEBU
0.6300 mg | INHALATION_SOLUTION | Freq: Four times a day (QID) | RESPIRATORY_TRACT | Status: DC
  Administered 2020-07-16 – 2020-07-18 (×8): 0.63 mg via RESPIRATORY_TRACT
  Filled 2020-07-16 (×12): qty 3

## 2020-07-16 MED ORDER — ASPIRIN EC 81 MG PO TBEC
81.0000 mg | DELAYED_RELEASE_TABLET | Freq: Every day | ORAL | Status: DC
  Administered 2020-07-17 – 2020-07-24 (×8): 81 mg via ORAL
  Filled 2020-07-16 (×8): qty 1

## 2020-07-16 NOTE — Plan of Care (Signed)
  Problem: Activity: Goal: Risk for activity intolerance will decrease Outcome: Progressing   

## 2020-07-16 NOTE — Care Management Important Message (Signed)
Important Message  Patient Details  Name: Ashley [name] MRN: [id number] Date of Birth: July 26, 1931   Medicare Important Message Given:  Yes  Initial Medicare IM given by Patient Access Associate on 07/15/2020 at 11:25am.     Dannette Barbara 07/16/2020, 8:27 AM

## 2020-07-16 NOTE — Progress Notes (Signed)
Physical Therapy Treatment Patient Details Name: Ashley [name] MRN: [id number] DOB: Mar 03, 1931 Today's Date: 07/16/2020    History of Present Illness Per MD note:Ashley [name] is a 86 y.o. female with medical history significant for CAD, COPD, HTN who presents to the emergency room with altered mental status x2 days.  Most of the history is taken from the son who states that he noticed that his mother was lethargic and not her usual self and appeared to not recognize him and his symptoms worsened into today prompting him to bring her into the emergency room.  He states that she had been complaining of weakness for the past 2 weeks which she attributed to her age.  She saw her PCP 3 weeks prior and had blood work done and also recently saw her cardiologist.  He also states that she has been having hip pain and has been seeing the orthopedist and taking Tylenol but no NSAIDs for her pain.  She has had no cough, fever chills or shortness of breath.  No reports of nausea vomiting abdominal pain or change in bowel habits and no dysuria    PT Comments    Pt in bed on arrival to room with daughter bedside and is agreeable to PT. Pt was minA+1 during bed mobility as she needed assistance with trunk to sit EOB. Pt requires CGA and VCs utilized to bring hips forward so feet are touching the ground. She is able to push up from the bed but needed assistance to help stand up straight. While standing using RW, pt is able to perform hip marches without any LOB. Pt walked 10 feet towards the bathroom with some SOB, but on the return back to the chair her SOB increased. O2 desat to 88 on 2L of O2 and HR was 89, but within 4 deep breaths O2 back to 94. Pt step lengths are decreased and neded verbal cues to stay inside the RW. Pt has good eccentric control during sit to stand transfer and performed 5TSTS with CGA in 55 secs. The score indicates she is at an increase risk for falls with LE weakness and decreased power.  Once seated in recliner, pt performed more exercises and educated to continue throughout her day. Pt is a little impulsive, as she moves before it safe to and is more distracted during session today. Pt left in chair with all needs. She will continue to benefit from skilled PT in order to increase strength, endurance, and mobility.  Follow Up Recommendations  SNF     Equipment Recommendations  None recommended by PT    Recommendations for Other Services       Precautions / Restrictions Precautions Precautions: Fall Restrictions Weight Bearing Restrictions: No    Mobility  Bed Mobility Overal bed mobility: Needs Assistance Bed Mobility: Supine to Sit     Supine to sit: HOB elevated;Min assist     General bed mobility comments: Pt able to sit EOB with UE support with increased time; VCs utilized for proper hand placement; needed some assistance with her trunk  Transfers Overall transfer level: Needs assistance Equipment used: Rolling walker (2 wheeled) Transfers: Sit to/from Stand Sit to Stand: Min assist         General transfer comment: Pt able to stand pushing off from bed with CGA assistance for safety; minA from recliner as there are arm rests on both sides of chair  Ambulation/Gait Ambulation/Gait assistance: Min guard Gait Distance (Feet): 20 Feet Assistive device: Rolling walker (  2 wheeled) Gait Pattern/deviations: Step-through pattern;Decreased step length - right;Decreased step length - left     General Gait Details: Pt walked in her room to the bathroom and back to the chair. VCs utilized for proper mechanics of RW; Pt SOB towards the end of walk, checked vitals when she was back in the chair. O2 desat to 88 and HR was 89, but within 4 deep breaths O2 back to 94   Stairs             Wheelchair Mobility    Modified Rankin (Stroke Patients Only)       Balance Overall balance assessment: Needs assistance Sitting-balance support: Feet  supported Sitting balance-Leahy Scale: Good Sitting balance - Comments: Pt able to maintain balance seated EOB once feet are supported;   Standing balance support: Bilateral upper extremity supported Standing balance-Leahy Scale: Fair Standing balance comment: Pt able to maintain balance with UE support on walker                            Cognition Arousal/Alertness: Awake/alert;Lethargic Behavior During Therapy: [facility] for tasks assessed/performed;Impulsive                                   General Comments: Pt is awake/alert but is easily distracted today and not as focused showing some lethargic tendencies; requires multimodal cueing, slow processing, requires increased time with one step cues; some impulsivity as she will start moving before it's safe;      Exercises General Exercises - Lower Extremity Ankle Circles/Pumps: AROM;Both;10 reps;Seated Quad Sets: AROM;Both;10 reps;Seated Hip Flexion/Marching: AROM;Both;10 reps;Standing Other Exercises Other Exercises: 5xSTS finished in 55 secs, pt showed increased SOB and distracted by conversation during test    General Comments General comments (skin integrity, edema, etc.): Pt is more distracted today during exercises as she is fixated on conversation      Pertinent Vitals/Pain Pain Assessment: No/denies pain    Home Living                      Prior Function            PT Goals (current goals can now be found in the care plan section) Acute Rehab PT Goals Patient Stated Goal: to walk PT Goal Formulation: Patient unable to participate in goal setting Time For Goal Achievement: 07/27/20 Potential to Achieve Goals: Good Progress towards PT goals: Progressing toward goals    Frequency    7X/week      PT Plan Current plan remains appropriate    Co-evaluation              AM-PAC PT "6 Clicks" Mobility   Outcome Measure  Help needed turning from your back to your side  while in a flat bed without using bedrails?: A Little Help needed moving from lying on your back to sitting on the side of a flat bed without using bedrails?: A Little Help needed moving to and from a bed to a chair (including a wheelchair)?: A Little Help needed standing up from a chair using your arms (e.g., wheelchair or bedside chair)?: A Little Help needed to walk in hospital room?: A Little Help needed climbing 3-5 steps with a railing? : A Lot 6 Click Score: 17    End of Session Equipment Utilized During Treatment: Gait belt;Oxygen Activity Tolerance: Patient tolerated treatment well;Patient  limited by fatigue Patient left: in chair;with call bell/phone within reach;with chair alarm set;with family/visitor present Nurse Communication: Mobility status PT Visit Diagnosis: Muscle weakness (generalized) (M62.81);Difficulty in walking, not elsewhere classified (R26.2)     Time: 8614-8307 PT Time Calculation (min) (ACUTE ONLY): 43 min  Charges:                         Noemi Chapel, SPT Bernita Raisin 07/16/2020, 10:21 AM

## 2020-07-16 NOTE — Progress Notes (Deleted)
Pt agitated, Told RN she needed to pee, RN tried to help, Pt swatting arms at [employer]. When RN asked for techs help, Tech explained pt did the same thing to tech earlier. RN will give prn haldol for agitation

## 2020-07-16 NOTE — Progress Notes (Addendum)
PROGRESS NOTE    Patient: Ashley [name]                            PCP: Albina Billet, MD                    DOB: 12/19/1930            DOA: 07/12/2020 KWI:[id number]             DOS: 07/16/2020, 10:08 AM   LOS: 3 days   Date of Service: The patient was seen and examined on 07/16/2020  Subjective:    The patient was seen and examined this morning, stable awake alert oriented, Mild respiratory distress, still requiring 2 L of oxygen, satting 97%  Apparently patient has had 2 stools in past 24 hours, greenish dark in color -no frank blood was noted  Daughter bedside... Discussed plan of care   Patient and family agreeable to SNF, yet to determine if anticoagulation therapy, further recommendation from neurology regarding aneurysms. GI recommended no further intervention at this time due to acute CVA, H&H stable     Brief Narrative:   Per HPI:  Ashley [name] is a 86 y.o. f w PMH/o   CAD, COPD, HTN who presents to the emergency room with  with 2-day history of altered mental status preceded by 2-week history of weakness   On presentation patient was lethargic, somnolent unable to provide history History is provided by son with complaint of 2 weeks weaknesses progressive lethargic. -Has seen PCP and cardiology in the past 3 weeks.  No medication changes.  Denies excessive NSAID use. This morning patient reported she was taken off aspirin Plavix due to previous history of anemia questionable GI bleed in the past.   ED Course:  On admission was lethargic, confused tachypneic, BP was stable  Blood work notable for hemoglobin of 4.7.  Last hemoglobin on file 10.7 from a year prior.  Stool guaiac positive.   SARS-CoV-2 negative -chest x-ray with no acute findings.   -Head CT showed right occipital lobe hypodensity consistent with acute or subacute infarct.   Blood transfusion started in the ER.      Assessment & Plan:   Principal Problem:   Acute CVA (cerebrovascular  accident) [facility]) Active Problems:   Coronary artery disease   Benign hypertension   Acute on chronic blood loss anemia   Severe Symptomatic anemia   Acute metabolic encephalopathy   COPD (chronic obstructive pulmonary disease) (HCC)   Stroke (cerebrum) (HCC)    Acute/subacute CVA (cerebrovascular accident) ([place]), in the setting of severe anemia Intracranial aneurysms/largest 10 mm Bilateral carotid stenosis  -Metastatic back to baseline, -Mild memory deficit, otherwise negative.  Further focal neurological findings   On admission:patient presents with confusion with right occipital lobe infarct on CT -Symptom onset 48 hours prior to arrival, outside TPA window and not a candidate either way due to positive stool guaiac and severe anemia   -Echocardiogram: Reviewed -Cardiac monitoring to evaluate for arrhythmias-negative for any dysrhythmias  -MRI/MRA -consistent with aneurysms-right PICA right PCA ischemic changes Left and right ICA aneurysm largest 10 mm -Bilateral carotid study, right 70% stenosis, left 60-65% stenosis Chest x-ray 07/15/2020 reviewed-consider normalized CHF exacerbation, atherosclerotic changes  -Appreciate neurology follow-up and recommendations regarding current finding of aneurysm Neurology recommended-verbally recommended:   "anticoagulation with aspirin, follow-up as an outpatient with neurosurgery regarding the largest 10 mm ICA aneurysm -for potential  coiling in the future versus outpatient serial imaging in 3 to 6 months Given her age and comorbidities likely has had aneurysm for long time"   -Anticipating once patient stable from the GI standpoint restarted on aspirin    Acute respiratory distress   COPD (chronic obstructive pulmonary disease) (HCC)-mild exacerbation -Improved respiratory distress this a.m. -Satting 97% on 2 L of oxygen---remained stable -Supplement fever as needed -DuoNebs as needed  -Continue supplemental oxygen, nebs as  needed -Mild COPD exacerbation versus mild volume overload  -Discontinue IV fluids -Lasix 40 mg x  On  9/20, 9/21 -We will monitor I's and O's, daily weight    Acute on suspect chronic Blood loss anemia/ Severe Symptomatic anemia -Status post 2 units of PRBC.  Pending repeat H&H anticipating another unit of transfusion -On admission hemoglobin 4.7 ---2U PRBC transfused >> 8.1>>8.5  >> 7.8  guaiac positive,--previously hemoglobin was >10.0 at baseline -Hemodynamically stable -GI consulted -believe the patient might help chronic iron deficiency, possible multiple chronic microbleed GNR recommending upper lower endoscopy at this time.    Acute metabolic encephalopathy -Resolved back to baseline alert oriented x3 -Likely due to anemia, versus stroke -We will continue treating underlying causing anemia and CVA -Continue neurochecks      Coronary artery disease -Stable denies any chest pain -Hold aspirin products.   -Reviewed home medication, resuming Imdur, beta-blockers, But holding lisinopril,    Benign hypertension -Stable Blood pressures running low due to severe anemia -Withholding amlodipine, lisinopril,   Nutritional status:  Nutrition Problem: Inadequate oral intake Etiology: decreased appetite Signs/Symptoms: per patient/family report Interventions: Ensure Enlive (each supplement provides 350kcal and 20 grams of protein)  Cultures; Blood Cultures x 2 >> NGT Urine Culture  >>> NGT   Antimicrobials: None    Consultants: GI and Neurology    ---------------------------------------------------------------------------------------------------------------------------------------------  DVT prophylaxis:  SCD/Compression stockings Code Status:   Code Status: DNR Family Communication: son Mr. Nissi Doffing -patient daughter present at bedside, updated  The above findings and plan of care has been discussed son in detail,  they expressed understanding and  agreement of above. -Advance care planning has been discussed.   Admission status:    Status is: Inpatient  Remains inpatient appropriate because:Inpatient level of care appropriate due to severity of illness   Dispo: The patient is from: Home              Anticipated d/c is to: SNF              Anticipated d/c date is: in 1-2 days               Patient currently is not medically stable to d/c.        Procedures:   Echo, MRI/MRA CT of the head  Antimicrobials:  Anti-infectives (From admission, onward)   None       Medication:  .  stroke: mapping our early stages of recovery book   Does not apply Once  . ALPRAZolam  0.25 mg Oral BID  . artificial tears   Both Eyes Q8H  . docusate sodium  100 mg Oral BID  . feeding supplement (ENSURE ENLIVE)  237 mL Oral BID BM  . ferrous sulfate  325 mg Oral BID WC  . Ipratropium-Albuterol  1 puff Inhalation Q6H  . isosorbide dinitrate  30 mg Oral BID  . metoprolol succinate  75 mg Oral Daily  . simvastatin  40 mg Oral Daily  . vitamin B-12  1,000 mcg Oral Daily  acetaminophen **OR** acetaminophen (TYLENOL) oral liquid 160 mg/5 mL **OR** acetaminophen, ALPRAZolam, gadobutrol, HYDROcodone-acetaminophen, sodium chloride flush   Objective:   Vitals:   07/15/20 0802 07/15/20 1500 07/16/20 0021 07/16/20 0755  BP: 130/71 125/79 127/61 134/65  Pulse: 100 95 91 90  Resp: 18 17 20  (!) 23  Temp: 98.4 F (36.9 C) 98.6 F (37 C) 97.9 F (36.6 C) 97.7 F (36.5 C)  TempSrc: Oral Oral Oral Oral  SpO2: 97% 98% 100% 97%  Weight:      Height:        Intake/Output Summary (Last 24 hours) at 07/16/2020 1008 Last data filed at 07/16/2020 0425 Gross per 24 hour  Intake 120 ml  Output 150 ml  Net -30 ml   Filed Weights   07/12/20 2243  Weight: 73.5 kg     Examination:        Physical Exam:   General:  Alert, oriented, cooperative, no distress;   HEENT:  Normocephalic, PERRL, otherwise with in Normal limits   Neuro:   CNII-XII intact. , normal motor and sensation, reflexes intact   Lungs:   Clear to auscultation BL, Respirations unlabored, no wheezes / crackles  Cardio:    S1/S2, RRR, No murmure, No Rubs or Gallops   Abdomen:   Soft, non-tender, bowel sounds active all four quadrants,  no guarding or peritoneal signs.  Muscular skeletal:   Generalized weaknesses, Limited exam - in bed, able to move all 4 extremities, Normal strength,  2+ pulses,  symmetric, No pitting edema  Skin:  Dry, warm to touch, negative for any Rashes, No open wounds  Wounds: Please see nursing documentation               ------------------------------------------------------------------------------------------------------------------------------------------    LABs:  CBC Latest Ref Rng & Units 07/16/2020 07/15/2020 07/14/2020  WBC 4.0 - 10.5 K/uL 7.7 8.5 5.9  Hemoglobin 12.0 - 15.0 g/dL 7.8(L) 8.5(L) 8.1(L)  Hematocrit 36 - 46 % 28.1(L) 30.1(L) 28.2(L)  Platelets 150 - 400 K/uL 156 165 173   CMP Latest Ref Rng & Units 07/16/2020 07/15/2020 07/14/2020  Glucose 70 - 99 mg/dL 114(H) 98 96  BUN 8 - 23 mg/dL 20 12 14   Creatinine 0.44 - 1.00 mg/dL 0.62 0.55 0.59  Sodium 135 - 145 mmol/L 141 138 140  Potassium 3.5 - 5.1 mmol/L 4.0 4.2 3.4(L)  Chloride 98 - 111 mmol/L 106 108 109  CO2 22 - 32 mmol/L 28 23 26   Calcium 8.9 - 10.3 mg/dL 8.6(L) 8.3(L) 8.0(L)  Total Protein 6.5 - 8.1 g/dL - - -  Total Bilirubin 0.3 - 1.2 mg/dL - - -  Alkaline Phos 38 - 126 U/L - - -  AST 15 - 41 U/L - - -  ALT 0 - 44 U/L - - -       Micro Results Recent Results (from the past 240 hour(s))  SARS Coronavirus 2 by RT PCR (hospital order, performed in [facility] hospital lab) Nasopharyngeal Nasopharyngeal Swab     Status: None   Collection Time: 07/13/20 12:09 AM   Specimen: Nasopharyngeal Swab  Result Value Ref Range Status   SARS Coronavirus 2 NEGATIVE NEGATIVE Final    Comment: (NOTE) SARS-CoV-2 target nucleic acids are NOT  DETECTED.  The SARS-CoV-2 RNA is generally detectable in upper and lower respiratory specimens during the acute phase of infection. The lowest concentration of SARS-CoV-2 viral copies this assay can detect is 250 copies / mL. A negative result does not preclude SARS-CoV-2 infection and  should not be used as the sole basis for treatment or other patient management decisions.  A negative result may occur with improper specimen collection / handling, submission of specimen other than nasopharyngeal swab, presence of viral mutation(s) within the areas targeted by this assay, and inadequate number of viral copies (<250 copies / mL). A negative result must be combined with clinical observations, patient history, and epidemiological information.  Fact Sheet for Patients:   StrictlyIdeas.no  Fact Sheet for Healthcare Providers: BankingDealers.co.za  This test is not yet approved or  cleared by the [place] FDA and has been authorized for detection and/or diagnosis of SARS-CoV-2 by FDA under an Emergency Use Authorization (EUA).  This EUA will remain in effect (meaning this test can be used) for the duration of the COVID-19 declaration under Section 564(b)(1) of the Act, 21 U.S.C. section 360bbb-3(b)(1), unless the authorization is terminated or revoked sooner.  Performed at [facility], [address] [place],  [address]     Radiology Reports CT ANGIO HEAD W OR WO CONTRAST  Result Date: 07/14/2020 CLINICAL DATA:  Stroke EXAM: CT ANGIOGRAPHY HEAD AND NECK TECHNIQUE: Multidetector CT imaging of the head and neck was performed using the standard protocol during bolus administration of intravenous contrast. Multiplanar CT image reconstructions and MIPs were obtained to evaluate the vascular anatomy. Carotid stenosis measurements (when applicable) are obtained utilizing NASCET criteria, using the distal internal carotid  diameter as the denominator. CONTRAST:  38mL OMNIPAQUE IOHEXOL 350 MG/ML SOLN COMPARISON:  CT head and MRI head 07/13/2020 FINDINGS: CT HEAD FINDINGS Brain: Hypodensity throughout most of the right occipital lobe unchanged from recent CT and MRI compatible with acute right PCA infarct. No associated hemorrhage. Patchy white matter hypodensity bilaterally consistent with chronic microvascular ischemia of a moderate degree. Ventricle size normal. Negative for mass lesion. Vascular: Negative for hyperdense vessel Skull: Negative Sinuses: Paranasal sinuses clear. Orbits: Bilateral cataract extraction.  No orbital mass. Review of the MIP images confirms the above findings CTA NECK FINDINGS Aortic arch: Atherosclerotic calcification aortic arch and proximal great vessels without stenosis or aneurysm. Tortuosity of the proximal great vessels noted. Right carotid system: Atherosclerotic disease right carotid bifurcation extending into the proximal right internal carotid artery. 40% diameter stenosis proximal right internal carotid artery. Left carotid system: Atherosclerotic calcification left carotid bifurcation without significant stenosis. Vertebral arteries: Moderate calcific stenosis at the origin of the right vertebral artery. Right vertebral artery is patent to the basilar without additional stenosis. Non dominant left vertebral artery without significant stenosis. Skeleton: No acute abnormality. Other neck: Negative for mass or adenopathy. Upper chest: Moderate right pleural effusion and small left effusion. Negative for mass or pneumonia in the lung apices. Review of the MIP images confirms the above findings CTA HEAD FINDINGS Anterior circulation: Atherosclerotic calcification throughout the cavernous carotid bilaterally without significant stenosis. 6 x 9 mm aneurysm of the right cavernous carotid proximal to the anterior genu. This projects medially. 3 mm aneurysm in the anterior genu of the left cavernous  carotid in the ophthalmic artery region. Possible small blister aneurysm of the left internal carotid artery projecting medially as noted on MRA. Anterior and middle cerebral arteries patent bilaterally without significant stenosis. Posterior circulation: Both vertebral arteries patent to the basilar. Right vertebral artery dominant. PICA patent bilaterally. Basilar widely patent. AICA, superior cerebellar, and posterior cerebral arteries patent bilaterally without stenosis. Right PCA widely patent in the setting of acute right PCA infarct. Venous sinuses: Normal venous enhancement. Anatomic variants: None Review  of the MIP images confirms the above findings IMPRESSION: 1. Acute infarct right PCA territory unchanged. Negative for hemorrhage. Right PCA is patent. 2. Atherosclerotic disease right carotid bifurcation with 40% diameter stenosis proximal right internal carotid artery. Left carotid patent without significant stenosis 3. Moderate stenosis due to atherosclerotic calcification origin of right vertebral artery. Left vertebral artery patent without significant stenosis. 4. Bilateral cavernous carotid aneurysms as above. Electronically Signed   By: Franchot Gallo M.D.   On: 07/14/2020 11:46   CT Head Wo Contrast  Result Date: 07/13/2020 CLINICAL DATA:  Altered level of consciousness EXAM: CT HEAD WITHOUT CONTRAST TECHNIQUE: Contiguous axial images were obtained from the base of the skull through the vertex without intravenous contrast. COMPARISON:  None. FINDINGS: Brain: Hypodensity within the right occipital lobe consistent with acute or subacute infarct. Effacement of the overlying sulci and occipital horn of the right lateral ventricle. No evidence of hemorrhagic conversion. Scattered hypodensities elsewhere throughout the periventricular and subcortical white matter likely reflect chronic small vessel ischemic change. No acute hemorrhage. Lateral ventricles and midline structures are otherwise  unremarkable. No acute extra-axial fluid collections. No mass effect. Vascular: No hyperdense vessel or unexpected calcification. Skull: Normal. Negative for fracture or focal lesion. Sinuses/Orbits: No acute finding. Other: None. IMPRESSION: 1. Right occipital lobe hypodensity consistent with acute or subacute infarct. 2. Likely chronic small vessel ischemic changes scattered throughout the white matter. 3. No acute hemorrhage. Critical Value/emergent results were called by telephone at the time of interpretation on 07/13/2020 at 3:16 am to provider [facility] , who verbally acknowledged these results. Electronically Signed   By: Randa Ngo M.D.   On: 07/13/2020 03:21   CT ANGIO NECK W OR WO CONTRAST  Result Date: 07/14/2020 : CT angiogram neck reported under the CT angio head report and accession number Electronically Signed   By: Franchot Gallo M.D.   On: 07/14/2020 11:53   MR ANGIO HEAD WO CONTRAST  Result Date: 07/13/2020 CLINICAL DATA:  86 year old female with altered mental status. Increasing confusion x3 days. Acute to subacute appearing right PCA territory infarct on plain head CT earlier today. History of GI bleeding, and substantially low hemoglobin and hematocrit levels on presentation. Being transfused. EXAM: MRA HEAD WITHOUT CONTRAST TECHNIQUE: Angiographic images of the Circle of Willis were obtained using MRA technique without intravenous contrast. COMPARISON:  Neck MRA today reported separately. FINDINGS: Antegrade flow in the posterior circulation with dominant distal right vertebral artery. No distal vertebral stenosis. Patent left PICA origin. The right AICA appears to be dominant. No right PICA identified. Patent vertebrobasilar junction. Patent basilar artery without stenosis. Patent SCA and PCA origins, and bilateral PCA branches appear symmetric and within normal limits. Antegrade flow in both ICA siphons. Both cavernous ICA segments appear abnormal, with a large roughly 10 mm  saccular versus fusiform aneurysm of the cavernous right ICA directed medially. And there is a much smaller 3 mm saccular aneurysm of the cavernous left ICA directed superiorly and medially. See series 1, images 96 and 100. furthermore, there is evidence of a second small distal left ICA aneurysm, in the form of a 3-4 mm aneurysm directed medially from the left anterior genu (intradural). But no superimposed ICA stenosis is identified in both carotid termini are patent. Ophthalmic artery origins appear within normal limits. Posterior communicating arteries are diminutive or absent. Patent MCA and ACA origins. Anterior communicating artery and visible ACA branches are within normal limits. MCA M1 segments and bifurcations are patent without evidence of  stenosis. Visible bilateral MCA branches are within normal limits. IMPRESSION: 1. Positive for multiple ICA aneurysms: - large, roughly 10 mm aneurysm of the cavernous Right ICA directed medially. - 4 mm Left ICA anterior genu aneurysm (intradural). - 3 mm cavernous Left ICA aneurysm. 2. Negative for large vessel occlusion or intracranial stenosis, the Right PCA is within normal limits. 3. Considering both the intracranial and extracranial MRA findings, a follow-up CTA Head and Neck with IV contrast is recommended when feasible. Electronically Signed   By: Genevie Ann M.D.   On: 07/13/2020 18:18   MR ANGIO NECK WO CONTRAST  Result Date: 07/13/2020 CLINICAL DATA:  86 year old female with altered mental status. Increasing confusion x3 days. Acute to subacute appearing right PCA territory infarct on plain head CT earlier today. History of GI bleeding, and substantially low hemoglobin and hematocrit levels on presentation. Being transfused. A study without and with contrast was planned but the patient's IV access infiltrated. EXAM: MRA NECK WITHOUT CONTRAST TECHNIQUE: Angiographic images of the neck were obtained using MRA technique without intravenous contrast. Carotid  stenosis measurements (when applicable) are obtained utilizing NASCET criteria, using the distal internal carotid diameter as the denominator. COMPARISON:  Brain MRI today. Carotid Doppler ultrasound earlier today. FINDINGS: Time-of-flight MRA images demonstrate antegrade flow in both cervical carotid and vertebral arteries to the skull base. Dominant appearing right vertebral artery, no evidence of vertebral artery stenosis in the visible V1 or V2 segments. Both carotid bifurcations also are patent, but there are no flow gaps to suggest a high-grade cervical ICA stenosis. However, there is irregularity and narrowing of the proximal right ICA (series 6, image 73), roughly estimated at 50 % with respect to the distal vessel on these images. Mild left ICA distal bulb irregularity without that degree of stenosis. IMPRESSION: Evidence of right greater than left ICA atherosclerosis, but time-of-flight MRA does NOT confirm a high-grade carotid stenosis in the neck. Follow-up CTA neck with IV contrast would best evaluate further as necessary. Electronically Signed   By: Genevie Ann M.D.   On: 07/13/2020 18:11   MR BRAIN WO CONTRAST  Result Date: 07/13/2020 CLINICAL DATA:  86 year old female with altered mental status. Increasing confusion x3 days. Acute to subacute appearing right PCA territory infarct on plain head CT earlier today. History of GI bleeding, and substantially low hemoglobin and hematocrit levels on presentation. Being transfused. EXAM: MRI HEAD WITHOUT CONTRAST TECHNIQUE: Multiplanar, multiecho pulse sequences of the brain and surrounding structures were obtained without intravenous contrast. COMPARISON:  Head CT 0224 hours today. FINDINGS: Brain: Confluent restricted diffusion throughout the right occipital lobe, posterior right temporal lobe including the right hippocampal formation. T2 and FLAIR hyperintense cytotoxic edema in the affected areas. Minimal T1 hypointensity. The right thalamus is spared. No  acute intracranial hemorrhage identified. No significant intracranial mass effect. Additionally there is a small focus of patchy restricted diffusion in the right inferior cerebellum, PICA territory (series 9, image 10) with minimal T2 and FLAIR hyperintensity. No mass effect. No other restricted diffusion. Widely scattered moderate for age bilateral cerebral white matter T2 and FLAIR hyperintensity. But no chronic cortical encephalomalacia or chronic cerebral blood products are identified. Mild T2 heterogeneity in the deep gray nuclei, primarily the left thalamus. No midline shift, mass effect, evidence of mass lesion, ventriculomegaly, extra-axial collection or acute intracranial hemorrhage. Cervicomedullary junction and pituitary are within normal limits. Vascular: Major intracranial vascular flow voids are preserved. Mild intracranial artery tortuosity. Skull and upper cervical spine: Negative for age visible  cervical spine. Mildly heterogeneous background bone marrow signal, also mildly abnormal on DWI. Sinuses/Orbits: Negative. Other: Scalp and face appear negative. IMPRESSION: 1. Confluent acute Right PCA territory infarct. Subtle acute infarct also in the cerebellum, Right PICA territory. No associated hemorrhage or mass effect. 2. Moderate for age chronic white matter signal changes likely due to small vessel disease. 3. Heterogeneous bone marrow signal likely reflects red marrow reactivation in the setting of anemia. Electronically Signed   By: Genevie Ann M.D.   On: 07/13/2020 18:06   US Carotid Bilateral (at [facility] and [facility] only)  Result Date: 07/13/2020 CLINICAL DATA:  Stroke. History of previous myocardial infarction and hypertension. EXAM: BILATERAL CAROTID DUPLEX ULTRASOUND TECHNIQUE: Pearline Cables scale imaging, color Doppler and duplex ultrasound were performed of bilateral carotid and vertebral arteries in the neck. COMPARISON:  None. FINDINGS: Criteria: Quantification of carotid stenosis is based on velocity  parameters that correlate the residual internal carotid diameter with NASCET-based stenosis levels, using the diameter of the distal internal carotid lumen as the denominator for stenosis measurement. The following velocity measurements were obtained: RIGHT ICA: 267/55 cm/sec CCA: 21/19 cm/sec SYSTOLIC ICA/CCA RATIO:  3.6 ECA: 126 cm/sec LEFT ICA: 177/39 cm/sec CCA: 41/74 cm/sec SYSTOLIC ICA/CCA RATIO:  2.8 ECA: 102 cm/sec RIGHT CAROTID ARTERY: There is a minimal amount of intimal thickening involving the right carotid bulb (image 21). There is a large amount predominantly hypoechoic plaque involving the right internal carotid artery resulting in elevated peak systolic velocities throughout the interrogated course of the right internal carotid artery. Greatest acquired peak systolic velocity within the mid right ICA measures 268 centimeters/second (image 38). RIGHT VERTEBRAL ARTERY:  Antegrade Flow LEFT CAROTID ARTERY: There is a minimal amount intimal thickening/atherosclerotic plaque involving the left carotid bulb. There is a large amount of echogenic irregular atherosclerotic plaque throughout the left common carotid artery, resulting in elevated peak systolic velocities within the proximal and mid aspects of the left internal carotid artery. Greatest acquired peak systolic velocity within the mid left ICA measures 177 centimeters/second (image 79). LEFT VERTEBRAL ARTERY:  Antegrade flow IMPRESSION: 1. Large amount of right-sided atherosclerotic plaque results in elevated peak systolic velocities within the right internal carotid artery compatible with the greater than 70% luminal narrowing range. Further evaluation with CTA could be performed as clinically indicated. 2. Large amount of left-sided atherosclerotic plaque results in elevated peak systolic velocities within the left internal carotid artery compatible with the higher end of the 50-69% luminal narrowing range. 3. Antegrade flow demonstrated within  the bilateral vertebral arteries. Electronically Signed   By: Sandi Mariscal M.D.   On: 07/13/2020 08:38   DG Chest Port 1 View  Result Date: 07/15/2020 CLINICAL DATA:  86 year old female with history of shortness of breath and weakness for the past 2 weeks. EXAM: PORTABLE CHEST 1 VIEW COMPARISON:  Chest x-ray 07/13/2020. FINDINGS: Low lung volumes. Bibasilar opacities which may reflect areas of atelectasis and/or consolidation. Small bilateral pleural effusions (left greater than right). Pulmonary venous congestion. Mild indistinctness of interstitial markings. Mild cardiomegaly. The patient is rotated to the left on today's exam, resulting in distortion of the mediastinal contours and reduced diagnostic sensitivity and specificity for mediastinal pathology. Aortic atherosclerosis. IMPRESSION: 1. The appearance the chest suggests mild congestive heart failure, with findings similar to the prior study, as above. 2. Aortic atherosclerosis. Electronically Signed   By: Vinnie Langton M.D.   On: 07/15/2020 10:02   DG Chest Portable 1 View  Result Date: 07/13/2020 CLINICAL DATA:  Dyspnea  EXAM: PORTABLE CHEST 1 VIEW COMPARISON:  07/15/2018 FINDINGS: Small left pleural effusion. Moderate cardiomegaly. No overt pulmonary edema or focal airspace consolidation. IMPRESSION: Cardiomegaly and small left pleural effusion. Electronically Signed   By: Ulyses Jarred M.D.   On: 07/13/2020 00:21   ECHOCARDIOGRAM COMPLETE  Result Date: 07/14/2020    ECHOCARDIOGRAM REPORT   Patient Name:   BERKLEIGH BECKLES Date of Exam: 07/13/2020 Medical Rec #:  [id number]      Height:       59.0 in Accession #:    [id number]     Weight:       162.0 lb Date of Birth:  1931-10-13      BSA:          1.686 m Patient Age:    12 years       BP:           148/72 mmHg Patient Gender: F              HR:           109 bpm. Exam Location:  [facility] Procedure: 2D Echo Indications:     SYNCOPE 780.2/R55  History:         Patient has prior history of  Echocardiogram examinations, most                  recent 07/13/2018. CAD, Stroke; Risk Factors:Hypertension.  Sonographer:     Avanell Shackleton Referring Phys:  [id number] Athena Masse Diagnosing Phys: Bartholome Bill MD IMPRESSIONS  1. Left ventricular ejection fraction, by estimation, is 55 to 60%. The left ventricle has normal function. The left ventricle has no regional wall motion abnormalities. Left ventricular diastolic parameters are consistent with Grade I diastolic dysfunction (impaired relaxation).  2. Right ventricular systolic function is normal. The right ventricular size is normal.  3. The mitral valve is grossly normal. Mild mitral valve regurgitation.  4. The aortic valve is grossly normal. Aortic valve regurgitation is not visualized. FINDINGS  Left Ventricle: Left ventricular ejection fraction, by estimation, is 55 to 60%. The left ventricle has normal function. The left ventricle has no regional wall motion abnormalities. The left ventricular internal cavity size was normal in size. There is  borderline left ventricular hypertrophy. Left ventricular diastolic parameters are consistent with Grade I diastolic dysfunction (impaired relaxation). Right Ventricle: The right ventricular size is normal. No increase in right ventricular wall thickness. Right ventricular systolic function is normal. Left Atrium: Left atrial size was normal in size. Right Atrium: Right atrial size was normal in size. Pericardium: There is no evidence of pericardial effusion. Mitral Valve: The mitral valve is grossly normal. Mild mitral valve regurgitation. Tricuspid Valve: The tricuspid valve is grossly normal. Tricuspid valve regurgitation is mild. Aortic Valve: The aortic valve is grossly normal. Aortic valve regurgitation is not visualized. Pulmonic Valve: The pulmonic valve was not well visualized. Pulmonic valve regurgitation is trivial. Aorta: The aortic root is normal in size and structure. IAS/Shunts: The interatrial  septum was not assessed.  LEFT VENTRICLE PLAX 2D LVIDd:         5.07 cm  Diastology LVIDs:         3.11 cm  LV e' medial:    7.72 cm/s LV PW:         1.04 cm  LV E/e' medial:  16.5 LV IVS:        1.02 cm  LV e' lateral:   7.07 cm/s LVOT diam:  2.10 cm  LV E/e' lateral: 18.0 LVOT Area:     3.46 cm  RIGHT VENTRICLE         IVC TAPSE (M-mode): 3.0 cm  IVC diam: 2.02 cm LEFT ATRIUM             Index       RIGHT ATRIUM           Index LA diam:        3.80 cm 2.25 cm/m  RA Area:     18.00 cm LA Vol (A2C):   68.4 ml 40.56 ml/m RA Volume:   46.20 ml  27.40 ml/m LA Vol (A4C):   74.9 ml 44.42 ml/m LA Biplane Vol: 74.5 ml 44.18 ml/m   AORTA Ao Root diam: 3.20 cm MITRAL VALVE                TRICUSPID VALVE MV Area (PHT): 7.09 cm     TR Peak grad:   63.7 mmHg MV Decel Time: 107 msec     TR Vmax:        399.00 cm/s MV E velocity: 127.00 cm/s MV A velocity: 147.00 cm/s  SHUNTS MV E/A ratio:  0.86         Systemic Diam: 2.10 cm Bartholome Bill MD Electronically signed by Bartholome Bill MD Signature Date/Time: 07/14/2020/8:45:55 AM    Final    CT Angio Abd/Pel w/ and/or w/o  Result Date: 07/13/2020 CLINICAL DATA:  Bilateral lower extremity edema.  GI bleed. EXAM: CTA ABDOMEN AND PELVIS WITHOUT AND WITH CONTRAST TECHNIQUE: Multidetector CT imaging of the abdomen and pelvis was performed using the standard protocol during bolus administration of intravenous contrast. Multiplanar reconstructed images and MIPs were obtained and reviewed to evaluate the vascular anatomy. CONTRAST:  127mL OMNIPAQUE IOHEXOL 350 MG/ML SOLN, 153mL OMNIPAQUE IOHEXOL 350 MG/ML SOLN COMPARISON:  07/15/2018 FINDINGS: VASCULAR Aorta: Aneurysmal dilatation of the infrarenal abdominal aorta measures 3.1 cm, image 57/4. Aortic atherosclerosis. No signs of dissection, vasculitis or stenosis. Celiac: Calcified plaque noted at the origin of the celiac trunk. No significant stenosis identified. SMA: Calcified plaque identified within the proximal superior  mesenteric artery with approximately 60% stenosis. Renals: Mild narrowing at the origin of the left renal artery with less than 15% stenosis. Calcification at the origin of the right renal artery is noted with approximately 15% stenosis. IMA: Patent without evidence of aneurysm, dissection, vasculitis or significant stenosis. Inflow: Patent without evidence of aneurysm, dissection, vasculitis or significant stenosis. Proximal Outflow: Bilateral common femoral and visualized portions of the superficial and profunda femoral arteries are patent without evidence of aneurysm, dissection, vasculitis or significant stenosis. Veins: No obvious venous abnormality within the limitations of this arterial phase study. Review of the MIP images confirms the above findings. NON-VASCULAR Lower chest: Small bilateral pleural effusions with overlying compressive type atelectasis. Hepatobiliary: Low-density structure within posteroinferior right lobe of liver measures 9 mm and is too small to characterize. No suspicious liver abnormality. Previous cholecystectomy. Mild increase caliber of the common bile duct is noted which measures up to 9 mm compatible with post cholecystectomy physiology. Pancreas: Unremarkable. No pancreatic ductal dilatation or surrounding inflammatory changes. Spleen: Normal in size without focal abnormality. Adrenals/Urinary Tract: Normal appearance of the adrenal glands. Bilateral kidney cysts are identified. The largest arises from the lateral cortex of the right mid kidney measuring 5.2 cm. No signs of hydronephrosis bilaterally. Urinary bladder is unremarkable. Stomach/Bowel: Moderate to large hiatal hernia. There is no bowel wall thickening, inflammation or distension.  No signs of contrast extravasation within the bowel loops to indicate site of GI bleed. Extensive sigmoid diverticulosis. No acute inflammation. Lymphatic: No abdominopelvic adenopathy. Reproductive: Status post hysterectomy. No adnexal  masses. Other: No free fluid or fluid collections identified. There is a periumbilical hernia which contains fat and a loop of nonobstructive transverse colon. Musculoskeletal: No acute or significant osseous findings. IMPRESSION: 1. No acute findings within the abdomen or pelvis. No signs of contrast extravasation within the bowel loops to indicate site of GI bleed. 2. Aortic atherosclerosis. There is a 3.1 cm infrarenal abdominal aortic aneurysm. Recommend follow-up every 3 years. this recommendation follows ACR consensus guidelines: White Paper of the ACR Incidental Findings Committee II on Vascular Findings. J Am Coll Radiol 2013; 10:789-794. 3. Bilateral kidney cysts. 4. Hiatal hernia. 5. Small bilateral pleural effusions with overlying compressive type atelectasis. 6. Periumbilical hernia contains fat and a loop of nonobstructive transverse colon. Aortic Atherosclerosis (ICD10-I70.0). Aortic aneurysm NOS (ICD10-I71.9). Electronically Signed   By: Kerby Moors M.D.   On: 07/13/2020 06:57    SIGNED: Deatra James, MD, FACP, FHM. [facility],  Pager (please use [email or web address] to page/text)  If 7PM-7AM, please contact night-coverage [email or web address], Password [facility] 07/16/2020, 10:08 AM

## 2020-07-16 NOTE — Progress Notes (Signed)
Pt sitting in bed, daughter in room. Pt's lungs sounds are improving since this morning, pt states she feels like she can breathe better. Pt denies any needs at this time. Call bell within reach

## 2020-07-16 NOTE — Progress Notes (Signed)
Pt c/o of SOB, panicking. RN assessed, VS taken. Pt's RR 56 HR 103 SpO2 99% on 3 L [place]. RN notified Attending MD. RN requested breathing treatment. See new orders

## 2020-07-16 NOTE — Plan of Care (Signed)
  Problem: Education: Goal: Knowledge of General Education information will improve Description: Including pain rating scale, medication(s)/side effects and non-pharmacologic comfort measures Outcome: Progressing   Problem: Health Behavior/Discharge Planning: Goal: Ability to manage health-related needs will improve Outcome: Progressing   Problem: Clinical Measurements: Goal: Ability to maintain clinical measurements within normal limits will improve Outcome: Progressing Goal: Will remain free from infection Outcome: Progressing Goal: Diagnostic test results will improve Outcome: Progressing Goal: Respiratory complications will improve Outcome: Progressing Goal: Cardiovascular complication will be avoided Outcome: Progressing   Problem: Activity: Goal: Risk for activity intolerance will decrease Outcome: Progressing   Problem: Nutrition: Goal: Adequate nutrition will be maintained Outcome: Progressing   Problem: Elimination: Goal: Will not experience complications related to bowel motility Outcome: Progressing Goal: Will not experience complications related to urinary retention Outcome: Progressing   Problem: Pain Managment: Goal: General experience of comfort will improve Outcome: Progressing   Problem: Safety: Goal: Ability to remain free from injury will improve Outcome: Progressing   Problem: Skin Integrity: Goal: Risk for impaired skin integrity will decrease Outcome: Progressing   Problem: Education: Goal: Knowledge of secondary prevention will improve Outcome: Progressing Goal: Knowledge of patient specific risk factors addressed and post discharge goals established will improve Outcome: Progressing

## 2020-07-16 NOTE — Progress Notes (Signed)
NEUROLOGY Progress NOTE   Date of service: July 16, 2020 Patient Name: Ashley [name] MRN:  [id number] DOB:  [birth year] Reason for consult: "Stroke, AMS"  History of Present Illness  Ashley [name] is a 86 y.o. female with PMH significant for CAD, COPD, HTN who presents with altered mental status and generalized weakness.  Per patient, she woke up on 07/12/20 at 6AM with feeling confused and took her 3-4 tries just to get her pill box setup. Something just want right. She went to bed on 07/11/20 at 2100 afte watching a preacher on the TV. She called her son an reports that she had trouble with dialing the phone. Son eventually brought her to the ED. Son endorsed that patient often repeats herself and does have problems with memory for a few years.  He has noticed decline over the last 2 weeks with patient being more confused.  In the ED, patient had work-up with CBC notable for hemoglobin of 4.7, BUN 29, a CT head was obtained that demonstrated a large right occipital lobe hypodensity consistent with an acute to subacute infarct.  She was started on blood transfusion and neurology was consulted for further assistance with the management of the noted right occipital stroke.  Son reports that patient has had prior GI bleed that happened while she was on aspirin and plavix. She was taken off. She had another bleed when she was not on any blood thinners.  Patient endorses bifrontal headache that is dull.   ROS    Constitutional Denies weight loss, fever and chills.  HEENT Denies changes in vision and hearing.  Respiratory Denies SOB and cough.  CV Denies palpitations and CP  GI Denies abdominal pain, nausea, vomiting and diarrhea.  GU Denies dysuria and urinary frequency.  MSK Denies myalgia and joint pain.  Skin Denies rash and pruritus.  Neurological Endorses headache but no syncope.  Psychiatric Denies recent changes in mood. Denies anxiety and depression.     Past History    Past Medical History:  Diagnosis Date  . Anemia   . Anxiety   . Arthritis   . COPD (chronic obstructive pulmonary disease) ([place])   . Coronary artery disease   . Edema    feet/ankles   wears support hose  . GERD (gastroesophageal reflux disease)   . GI (gastrointestinal bleed)    history  . Hypertension   . Myocardial infarction ([place])   . Peripheral vascular disease ([place])   . Shortness of breath dyspnea    Past Surgical History:  Procedure Laterality Date  . ABDOMINAL HYSTERECTOMY    . ABLATION     right leg less than 1 year ago  . CATARACT EXTRACTION W/ INTRAOCULAR LENS  IMPLANT, BILATERAL Bilateral   . CHOLECYSTECTOMY N/A 07/18/2018   Procedure: LAPAROSCOPIC CHOLECYSTECTOMY;  Surgeon: Jules Husbands, MD;  Location: [facility] ORS;  Service: General;  Laterality: N/A;  . CORONARY ANGIOPLASTY     stents  . ENDOSCOPIC RETROGRADE CHOLANGIOPANCREATOGRAPHY (ERCP) WITH PROPOFOL N/A 07/15/2018   Procedure: ENDOSCOPIC RETROGRADE CHOLANGIOPANCREATOGRAPHY (ERCP) WITH PROPOFOL;  Surgeon: [name], MD;  Location: [facility] ENDOSCOPY;  Service: Endoscopy;  Laterality: N/A;  . EYE SURGERY    . FRACTURE SURGERY     right wrist  . TOTAL HIP ARTHROPLASTY Right 02/20/2016   Procedure: TOTAL HIP ARTHROPLASTY ANTERIOR APPROACH;  Surgeon: Hessie Knows, MD;  Location: [facility] ORS;  Service: Orthopedics;  Laterality: Right;   History reviewed. No pertinent family history. Social History   Socioeconomic History  .  Marital status: Widowed    Spouse name: Not on file  . Number of children: Not on file  . Years of education: Not on file  . Highest education level: Not on file  Occupational History  . Not on file  Tobacco Use  . Smoking status: Former Smoker    Quit date: 02/11/1997    Years since quitting: 23.4  . Smokeless tobacco: Never Used  Vaping Use  . Vaping Use: Never used  Substance and Sexual Activity  . Alcohol use: No  . Drug use: No  . Sexual activity: Not on file  Other Topics  Concern  . Not on file  Social History Narrative  . Not on file   Social Determinants of Health   Financial Resource Strain:   . Difficulty of Paying Living Expenses: Not on file  Food Insecurity:   . Worried About Charity fundraiser in the Last Year: Not on file  . Ran Out of Food in the Last Year: Not on file  Transportation Needs:   . Lack of Transportation (Medical): Not on file  . Lack of Transportation (Non-Medical): Not on file  Physical Activity:   . Days of Exercise per Week: Not on file  . Minutes of Exercise per Session: Not on file  Stress:   . Feeling of Stress : Not on file  Social Connections:   . Frequency of Communication with Friends and Family: Not on file  . Frequency of Social Gatherings with Friends and Family: Not on file  . Attends Religious Services: Not on file  . Active Member of Clubs or Organizations: Not on file  . Attends Archivist Meetings: Not on file  . Marital Status: Not on file   No Known Allergies  Medications   Medications Prior to Admission  Medication Sig Dispense Refill Last Dose  . ALPRAZolam (XANAX) 0.25 MG tablet Take 0.25 mg by mouth 2 (two) times daily.    07/12/2020 at Unknown time  . amLODipine (NORVASC) 10 MG tablet Take 10 mg by mouth daily.   07/12/2020 at Unknown time  . Ipratropium-Albuterol (COMBIVENT RESPIMAT) 20-100 MCG/ACT AERS respimat Inhale 1 puff into the lungs every 6 (six) hours.   07/12/2020 at Unknown time  . isosorbide dinitrate (ISORDIL) 30 MG tablet Take 30 mg by mouth 2 (two) times daily.   07/12/2020 at Unknown time  . lisinopril (PRINIVIL,ZESTRIL) 10 MG tablet Take 10 mg by mouth daily.    07/12/2020 at Unknown time  . metoprolol succinate (TOPROL-XL) 50 MG 24 hr tablet Take 75 mg by mouth daily. Take with or immediately following a meal.   07/12/2020 at Unknown time  . nitroGLYCERIN (NITROSTAT) 0.4 MG SL tablet Place 1 tablet (0.4 mg total) under the tongue every 5 (five) minutes as needed for chest  pain. 15 tablet 2 prn at prn  . simvastatin (ZOCOR) 40 MG tablet Take 40 mg by mouth daily.   07/12/2020 at Unknown time  . vitamin B-12 (CYANOCOBALAMIN) 1000 MCG tablet Take 1,000 mcg by mouth daily.   07/12/2020 at Unknown time     Vitals  Temp:  [97.7 F (36.5 C)-98.6 F (37 C)] 97.7 F (36.5 C) (09/21 0755) Pulse Rate:  [90-95] 90 (09/21 0755) Resp:  [17-23] 23 (09/21 0755) BP: (125-134)/(61-79) 134/65 (09/21 0755) SpO2:  [97 %-100 %] 97 % (09/21 0755)  Body mass index is 32.72 kg/m.  Physical Exam   General: Laying comfortably in bed; in no acute distress.  HENT:  Normal oropharynx and mucosa. Normal external appearance of ears and nose. Neck: Supple, no pain or tenderness CV: No JVD. No peripheral edema. Pulmonary: Symmetric Chest rise. Normal respiratory effort. Abdomen: Soft to touch, non-tender Ext: No cyanosis, edema, or deformity  Skin: No rash. Normal palpation of skin.   Musculoskeletal: Normal digits and nails by inspection. No clubbing.  Neurologic Examination  Mental status/Cognition: Alert, oriented to self, place, month and year, good attention. Speech/language: Fluent, comprehension intact, object naming intact, repetition intact. Cranial nerves:   CN II Pupils equal and reactive to light, Left visual hemianopsia.   CN III,IV,VI EOM intact, no gaze preference or deviation, no nystagmus   CN V normal sensation in V1, V2, and V3 segments bilaterally   CN VII no asymmetry, no nasolabial fold flattening   CN VIII normal hearing to speech   CN IX & X normal palatal elevation, no uvular deviation   CN XI 5/5 head turn and 5/5 shoulder shrug bilaterally   CN XII midline tongue protrusion   Motor:  Muscle bulk: poor, tone normal Mvmt Root Nerve  Muscle Right Left Comments  SA C5/6 Ax Deltoid 5 5   EF C5/6 Mc Biceps 5 5   EE C6/7/8 Rad Triceps 5 5   WF C6/7 Med FCR 5 5   WE C7/8 PIN ECU 5 5   F Ab C8/T1 U ADM/FDI 5 5   HF L1/2/3 Fem Illopsoas 3 3 Known R hip  replacement and pain limiting eval of hip flexion BL  KE L2/3/4 Fem Quad 3 3 Pain with moving her knees limited knee strength eval  DF L4/5 D Peron Tib Ant 5 5   PF S1/2 Tibial Grc/Sol 5 5    Reflexes:  Right Left Comments  Pectoralis      Biceps (C5/6) 1 1   Brachioradialis (C5/6) 1 1    Triceps (C6/7) 1 1    Patellar (L3/4) 0 0    Achilles (S1) 0 0    Hoffman      Plantar down down   Jaw jerk    Sensation:  Light touch Intact BL   Pin prick    Temperature    Vibration   Proprioception    Coordination/Complex Motor:  - Finger to Nose intact BL - Heel to shin unable to assess due to weakness and pain in hip - Rapid alternating movement are slowed. - Gait: Deferred.  Labs   Lab Results  Component Value Date   NA 141 07/16/2020   K 4.0 07/16/2020   CL 106 07/16/2020   CO2 28 07/16/2020   GLUCOSE 114 (H) 07/16/2020   BUN 20 07/16/2020   CREATININE 0.62 07/16/2020   CALCIUM 8.6 (L) 07/16/2020   ALBUMIN 3.6 07/12/2020   AST 14 (L) 07/12/2020   ALT 12 07/12/2020   ALKPHOS 55 07/12/2020   BILITOT 0.8 07/12/2020   GFRNONAA >60 07/16/2020   GFRAA >60 07/16/2020     Imaging and Diagnostic studies  CTH without contrast: IMPRESSION: 1. Right occipital lobe hypodensity consistent with acute or subacute infarct. 2. Likely chronic small vessel ischemic changes scattered throughout the white matter. 3. No acute hemorrhage.  Lab Results  Component Value Date   LDLCALC 47 07/13/2020   Lab Results  Component Value Date   HGBA1C 5.2 07/13/2020     Impression   Ashley [name] is a 86 y.o. female with PMH significant for CAD, COPD, HTN who presents with altered mental status and generalized weakness and  found to have GI bleed and a large R occipital stroke. Her neurologic examination is notable for L hemianopsia. Will get workup to evaluate for a cause for her infarct.  Given her current GI bleed in the setting of no antiplatelet and her prior hx of GI bleed on  antiplatelet, it is reasonable to hold off on antiplatelet agnet. If there is an underlying treatable cause for her GI bleed and it is eliminated or treated, would recommend starting her on aspirin, otherwise, okay to hold off on aspirin.  Recommendations  -  Pt with R PCA stroke - Pt has bilateral aneurisms the biggest one 10 mm aneurysm of the cavernous Right ICA  - Aneurisms are incidental finding - Neurosurgery follow up as out patient for possible coiling or more likely serial imaging given the incidental finding and being 86 yrs old - can be d/c from Neurological stand point

## 2020-07-17 DIAGNOSIS — D649 Anemia, unspecified: Secondary | ICD-10-CM

## 2020-07-17 DIAGNOSIS — J449 Chronic obstructive pulmonary disease, unspecified: Secondary | ICD-10-CM

## 2020-07-17 DIAGNOSIS — I5033 Acute on chronic diastolic (congestive) heart failure: Secondary | ICD-10-CM

## 2020-07-17 LAB — BASIC METABOLIC PANEL
Anion gap: 7 (ref 5–15)
BUN: 22 mg/dL (ref 8–23)
CO2: 29 mmol/L (ref 22–32)
Calcium: 8.6 mg/dL — ABNORMAL LOW (ref 8.9–10.3)
Chloride: 101 mmol/L (ref 98–111)
Creatinine, Ser: 0.75 mg/dL (ref 0.44–1.00)
GFR calc Af Amer: >60 mL/min (ref >60)
GFR calc non Af Amer: >60 mL/min (ref >60)
Glucose, Bld: 93 mg/dL (ref 70–99)
Potassium: 4.2 mmol/L (ref 3.5–5.1)
Sodium: 137 mmol/L (ref 135–145)

## 2020-07-17 LAB — CBC
HCT: 27.5 % — ABNORMAL LOW (ref 36.0–46.0)
Hemoglobin: 7.3 g/dL — ABNORMAL LOW (ref 12.0–15.0)
MCH: 19.9 pg — ABNORMAL LOW (ref 26.0–34.0)
MCHC: 26.5 g/dL — ABNORMAL LOW (ref 30.0–36.0)
MCV: 75.1 fL — ABNORMAL LOW (ref 80.0–100.0)
Platelets: 147 10*3/uL — ABNORMAL LOW (ref 150–400)
RBC: 3.66 MIL/uL — ABNORMAL LOW (ref 3.87–5.11)
RDW: 26.1 % — ABNORMAL HIGH (ref 11.5–15.5)
WBC: 7.2 10*3/uL (ref 4.0–10.5)
nRBC: 0 % (ref 0.0–0.2)

## 2020-07-17 LAB — CBC WITH DIFFERENTIAL/PLATELET
Abs Immature Granulocytes: 0.04 10*3/uL (ref 0.00–0.07)
Basophils Absolute: 0 10*3/uL (ref 0.0–0.1)
Basophils Relative: 1 %
Eosinophils Absolute: 0.1 10*3/uL (ref 0.0–0.5)
Eosinophils Relative: 1 %
HCT: 31.8 % — ABNORMAL LOW (ref 36.0–46.0)
Hemoglobin: 8.9 g/dL — ABNORMAL LOW (ref 12.0–15.0)
Immature Granulocytes: 1 %
Lymphocytes Relative: 19 %
Lymphs Abs: 1.7 10*3/uL (ref 0.7–4.0)
MCH: 20.4 pg — ABNORMAL LOW (ref 26.0–34.0)
MCHC: 28 g/dL — ABNORMAL LOW (ref 30.0–36.0)
MCV: 72.8 fL — ABNORMAL LOW (ref 80.0–100.0)
Monocytes Absolute: 0.9 10*3/uL (ref 0.1–1.0)
Monocytes Relative: 10 %
Neutro Abs: 6.1 10*3/uL (ref 1.7–7.7)
Neutrophils Relative %: 68 %
Platelets: 160 10*3/uL (ref 150–400)
RBC: 4.37 MIL/uL (ref 3.87–5.11)
RDW: 26.2 % — ABNORMAL HIGH (ref 11.5–15.5)
Smear Review: NORMAL
WBC: 8.9 10*3/uL (ref 4.0–10.5)
nRBC: 0.3 % — ABNORMAL HIGH (ref 0.0–0.2)

## 2020-07-17 LAB — GLUCOSE, CAPILLARY
Glucose-Capillary: 139 mg/dL — ABNORMAL HIGH (ref 70–99)
Glucose-Capillary: 120 mg/dL — ABNORMAL HIGH (ref 70–99)

## 2020-07-17 MED ORDER — IPRATROPIUM BROMIDE 0.02 % IN SOLN
0.5000 mg | Freq: Four times a day (QID) | RESPIRATORY_TRACT | Status: DC
  Administered 2020-07-17 – 2020-07-18 (×4): 0.5 mg via RESPIRATORY_TRACT
  Filled 2020-07-17 (×4): qty 2.5

## 2020-07-17 MED ORDER — POLYSACCHARIDE IRON COMPLEX 150 MG PO CAPS
150.0000 mg | ORAL_CAPSULE | Freq: Every day | ORAL | Status: DC
  Administered 2020-07-18 – 2020-07-20 (×3): 150 mg via ORAL
  Filled 2020-07-17 (×4): qty 1

## 2020-07-17 MED ORDER — PANTOPRAZOLE SODIUM 40 MG PO TBEC
40.0000 mg | DELAYED_RELEASE_TABLET | Freq: Every day | ORAL | Status: DC
  Administered 2020-07-18 – 2020-07-20 (×3): 40 mg via ORAL
  Filled 2020-07-17 (×3): qty 1

## 2020-07-17 MED ORDER — POLYETHYLENE GLYCOL 3350 17 G PO PACK
17.0000 g | PACK | Freq: Every day | ORAL | Status: DC
  Administered 2020-07-18 – 2020-07-24 (×6): 17 g via ORAL
  Filled 2020-07-17 (×7): qty 1

## 2020-07-17 MED ORDER — FUROSEMIDE 10 MG/ML IJ SOLN
40.0000 mg | Freq: Once | INTRAMUSCULAR | Status: AC
  Administered 2020-07-17: 40 mg via INTRAVENOUS
  Filled 2020-07-17: qty 4

## 2020-07-17 MED ORDER — SENNOSIDES-DOCUSATE SODIUM 8.6-50 MG PO TABS
1.0000 | ORAL_TABLET | Freq: Two times a day (BID) | ORAL | Status: DC
  Administered 2020-07-17 – 2020-07-24 (×14): 1 via ORAL
  Filled 2020-07-17 (×14): qty 1

## 2020-07-17 NOTE — Progress Notes (Signed)
Occupational Therapy Treatment Patient Details Name: Ashley [name] MRN: [id number] DOB: 08/22/1931 Today's Date: 07/17/2020    History of present illness Per MD note:Ashley [name] is a 86 y.o. female with medical history significant for CAD, COPD, HTN who presents to the emergency room with altered mental status x2 days.  Most of the history is taken from the son who states that he noticed that his mother was lethargic and not her usual self and appeared to not recognize him and his symptoms worsened into today prompting him to bring her into the emergency room.  He states that she had been complaining of weakness for the past 2 weeks which she attributed to her age.  She saw her PCP 3 weeks prior and had blood work done and also recently saw her cardiologist.  He also states that she has been having hip pain and has been seeing the orthopedist and taking Tylenol but no NSAIDs for her pain.  She has had no cough, fever chills or shortness of breath.  No reports of nausea vomiting abdominal pain or change in bowel habits and no dysuria   OT comments  Pt seen for OT treatment this date to f/u re: safety with self care ADLs/ADL mobility. Pt wanting to try to void when OT presents to room and agreeable to fxl mobility to restroom. Pt sitting up in chair and requires CGA/MIN A for sit to stand transfer with RW with MIN verbal/tactile cues for safe hand placement. Pt performs fxl mobility to restroom with RW with CGA/MIN A and MIN verbal cues for safety. In restroom, pt requires MIN A for pivot transfer with use of grab bars onto [facility] over standard commode to elevate height. Pt requires MIN A in standing for anterior peri care and perform hand hygiene with setup in standing (CGA for balance). Pt performs fxl mobility back to chair and engages with OT in below-listed exercises (see "other exercise" section). Of note: Pt with 3Lnc today, she is satting 92-94% spO2 at rest, 89-92% with ADL transfers/fxl mobility.  Requires consistent cueing throughout session to breathe through her note to utilize supplmental oxygen. Continue to anticipate that SNF is likely best d/c recommendation for pt. Will continue to follow per OT POC.   Follow Up Recommendations  SNF    Equipment Recommendations  Other (comment)    Recommendations for Other Services      Precautions / Restrictions Precautions Precautions: Fall Restrictions Weight Bearing Restrictions: No       Mobility Bed Mobility               General bed mobility comments: pt up to chair pre/post session  Transfers Overall transfer level: Needs assistance Equipment used: Rolling walker (2 wheeled) Transfers: Sit to/from Stand;Stand Pivot Transfers Sit to Stand: Min guard;Min assist Stand pivot transfers: Min assist       General transfer comment: MIN A to pivot onto BSC over standard commode in restroom with use of grab bars    Balance Overall balance assessment: Needs assistance Sitting-balance support: Feet supported Sitting balance-Leahy Scale: Good     Standing balance support: Bilateral upper extremity supported Standing balance-Leahy Scale: Fair                             ADL either performed or assessed with clinical judgement   ADL Overall ADL's : Needs assistance/impaired     Grooming: Wash/dry hands;Set up;Sitting  Toilet Transfer: Minimal assistance;Ambulation;RW;[facility]) Toilet Transfer Details (indicate cue type and reason): MOD cues for hand placement-use of grab bar Toileting- Clothing Manipulation and Hygiene: Minimal assistance;Sit to/from stand Toileting - Clothing Manipulation Details (indicate cue type and reason): MIN A to sustain standing balance to perform anterior peri care after voiding.     Functional mobility during ADLs: Min guard;Minimal assistance;Cueing for safety;Rolling walker       Vision Patient Visual Report: No change from baseline      Perception     Praxis      Cognition Arousal/Alertness: Awake/alert Behavior During Therapy: WFL for tasks assessed/performed Overall Cognitive Status: History of cognitive impairments - at baseline                                 General Comments: Improved attention this date. Follows simple one step commands with increased time. Requries verbal/tactile cues for safety/sequence.        Exercises Other Exercises Other Exercises: OT facilitates pt particiption in MIP x10 bilaterally with MIN verbal cues for safety with weight shift, and requires MOD cues to initiate rest breaks and PLB when her WOB is ntoed to increase. In addition, OT engages pt in 1 set x10 reps alternating one hand at a time from RW to improve balance for reaching to obtain ADL items.   Shoulder Instructions       General Comments Pt with 3Lnc today, she is satting 92-94% spO2 at rest, 89-92% with ADL transfers/fxl mobility. Requires consistent cueing throughout session to breathe through her note to utilize supplmental oxygen.    Pertinent Vitals/ Pain       Pain Assessment: No/denies pain  Home Living                                          Prior Functioning/Environment              Frequency  Min 1X/week        Progress Toward Goals  OT Goals(current goals can now be found in the care plan section)  Progress towards OT goals: Progressing toward goals  Acute Rehab OT Goals Patient Stated Goal: to walk OT Goal Formulation: With patient/family Time For Goal Achievement: 07/28/20 Potential to Achieve Goals: Good  Plan Discharge plan remains appropriate    Co-evaluation                 AM-PAC OT "6 Clicks" Daily Activity     Outcome Measure   Help from another person eating meals?: A Little Help from another person taking care of personal grooming?: A Little Help from another person toileting, which includes using toliet, bedpan, or urinal?: A  Lot Help from another person bathing (including washing, rinsing, drying)?: A Lot Help from another person to put on and taking off regular upper body clothing?: A Little Help from another person to put on and taking off regular lower body clothing?: A Lot 6 Click Score: 15    End of Session Equipment Utilized During Treatment: Gait belt;Rolling walker;Oxygen  OT Visit Diagnosis: Other abnormalities of gait and mobility (R26.89)   Activity Tolerance Patient tolerated treatment well   Patient Left with call bell/phone within reach;in chair;with chair alarm set;with family/visitor present;Other (comment) (with labs presenting to draw blood)   Nurse Communication  Time: 1432-1500 OT Time Calculation (min): 28 min  Charges: OT General Charges $OT Visit: 1 Visit OT Treatments $Self Care/Home Management : 8-22 mins $Therapeutic Exercise: 8-22 mins  Gerrianne Scale, MS, OTR/L ascom [phone] 07/17/20, 3:34 PM

## 2020-07-17 NOTE — Progress Notes (Signed)
Physical Therapy Treatment Patient Details Name: Ashley [name] MRN: [id number] DOB: 1931-05-13 Today's Date: 07/17/2020    History of Present Illness Per MD note:Ashley [name] is a 86 y.o. female with medical history significant for CAD, COPD, HTN who presents to the emergency room with altered mental status x2 days.  Most of the history is taken from the son who states that he noticed that his mother was lethargic and not her usual self and appeared to not recognize him and his symptoms worsened into today prompting him to bring her into the emergency room.  He states that she had been complaining of weakness for the past 2 weeks which she attributed to her age.  She saw her PCP 3 weeks prior and had blood work done and also recently saw her cardiologist.  He also states that she has been having hip pain and has been seeing the orthopedist and taking Tylenol but no NSAIDs for her pain.  She has had no cough, fever chills or shortness of breath.  No reports of nausea vomiting abdominal pain or change in bowel habits and no dysuria    PT Comments    Pt in bed on arrival to room with nursing staff giving her medication and inhaler. She is agreeable to PT. Pt was modA+1 during bed mobility as she needed assistance with trunk and UE support to sit EOB. Pt requires CGA and VCs utilized to bring hips forward so feet are touching the ground. She is able to push up from the bed and did not need any help to stand up straight.Pt walked 90 feet in the hallway with minimal SOB, but on the return back to the chair her SOB increased. O2 desat to 89 on 3L of O2 and HR was 94, but within 4 deep breaths O2 back to 94. Pt step lengths are decreased and neded verbal cues to stay inside the RW. Pt has good eccentric control during sit to stands and performed 7 with UE support on armrest. Once seated in recliner, pt performed more exercises and educated to continue throughout her day.Pt was not impulsive this session and  able to focus without distractions.Pt left in chair with all needs. She will continue to benefit from skilled PT in order to increase strength, endurance,and mobility.   Follow Up Recommendations  SNF     Equipment Recommendations  None recommended by PT    Recommendations for Other Services       Precautions / Restrictions Precautions Precautions: Fall Restrictions Weight Bearing Restrictions: No    Mobility  Bed Mobility Overal bed mobility: Needs Assistance Bed Mobility: Supine to Sit     Supine to sit: HOB elevated;Mod assist     General bed mobility comments: Pt needed more assistance today to sit EOB with RUE on bedrail and LUE on therapist's hand to help scoot forward. VCs utilized for proper hand placement and needed some assistance with her trunk  Transfers Overall transfer level: Needs assistance Equipment used: Rolling walker (2 wheeled) Transfers: Sit to/from Stand Sit to Stand: Min guard         General transfer comment: Pt able to stand pushing off from bed and sit down in chair with CGA assistance for safety; VCs utilized for proper hand placement during transfer  Ambulation/Gait Ambulation/Gait assistance: Min guard Gait Distance (Feet): 90 Feet Assistive device: Rolling walker (2 wheeled) Gait Pattern/deviations: Step-through pattern;Decreased step length - right;Decreased step length - left     General Gait Details:  Pt walked down the hall and back to the recliner chair. VCs utilized for proper mechanics of RW; Pt SOB once back in her room, checked vitals once she's sitting in her chair. O2 desat to 89% on 3L of O2 but within 5 deep breaths, back at 94%   Stairs             Wheelchair Mobility    Modified Rankin (Stroke Patients Only)       Balance Overall balance assessment: Needs assistance Sitting-balance support: Feet supported Sitting balance-Leahy Scale: Good Sitting balance - Comments: Pt able to maintain balance seated  EOB once feet are supported; Postural control: Other (comment) (No postural lean noted) Standing balance support: Bilateral upper extremity supported Standing balance-Leahy Scale: Fair Standing balance comment: Pt able to maintain balance with UE support on walker                            Cognition Arousal/Alertness: Awake/alert Behavior During Therapy: WFL for tasks assessed/performed                                   General Comments: Pt is awake/alert and is less distracted today. She requires multimodal cueing, slow processing, and requires increased time with one step cues;      Exercises General Exercises - Lower Extremity Ankle Circles/Pumps: AROM;Both;10 reps;Seated Quad Sets: AROM;Both;10 reps;Seated Long Arc Quad: AROM;Both;10 reps;Seated Hip ABduction/ADduction: AROM;Both;10 reps;Seated (Clam shells) Hip Flexion/Marching: AROM;Both;10 reps;Seated Other Exercises Other Exercises: Pt performed 7 sit to stands in chair with UE support, pt showed increased SOB and O2 desat to 88 on 3L but after a few breaths increased to 94    General Comments        Pertinent Vitals/Pain Pain Assessment: No/denies pain    Home Living                      Prior Function            PT Goals (current goals can now be found in the care plan section) Acute Rehab PT Goals Patient Stated Goal: to walk PT Goal Formulation: Patient unable to participate in goal setting Time For Goal Achievement: 07/27/20 Potential to Achieve Goals: Good Progress towards PT goals: Progressing toward goals    Frequency    7X/week      PT Plan Current plan remains appropriate    Co-evaluation              AM-PAC PT "6 Clicks" Mobility   Outcome Measure  Help needed turning from your back to your side while in a flat bed without using bedrails?: A Lot Help needed moving from lying on your back to sitting on the side of a flat bed without using  bedrails?: A Lot Help needed moving to and from a bed to a chair (including a wheelchair)?: A Little Help needed standing up from a chair using your arms (e.g., wheelchair or bedside chair)?: A Little Help needed to walk in hospital room?: A Little Help needed climbing 3-5 steps with a railing? : A Lot 6 Click Score: 15    End of Session Equipment Utilized During Treatment: Gait belt;Oxygen Activity Tolerance: Patient tolerated treatment well Patient left: in chair;with call bell/phone within reach;with chair alarm set;with family/visitor present Nurse Communication: Mobility status PT Visit Diagnosis: Muscle weakness (generalized) (M62.81);Difficulty in walking,  not elsewhere classified (R26.2)     Time: 2890-2284 PT Time Calculation (min) (ACUTE ONLY): 34 min  Charges:                        Noemi Chapel, SPT Bernita Raisin 07/17/2020, 11:06 AM

## 2020-07-17 NOTE — Progress Notes (Signed)
PROGRESS NOTE    Ashley [name]  DDU:[id number] DOB: 03-29-1931 DOA: 07/12/2020 PCP: Albina Billet, MD   Brief Narrative:  HPI per Dr. Judd Gaudier on 07/13/20 HPI: Ashley [name] is a 86 y.o. female with medical history significant for CAD, COPD, HTN who presents to the emergency room with altered mental status x2 days.  Most of the history is taken from the son who states that he noticed that his mother was lethargic and not her usual self and appeared to not recognize him and his symptoms worsened into today prompting him to bring her into the emergency room.  He states that she had been complaining of weakness for the past 2 weeks which she attributed to her age.  She saw her PCP 3 weeks prior and had blood work done and also recently saw her cardiologist.  He also states that she has been having hip pain and has been seeing the orthopedist and taking Tylenol but no NSAIDs for her pain.  She has had no cough, fever chills or shortness of breath.  No reports of nausea vomiting abdominal pain or change in bowel habits and no dysuria ED Course: On arrival in the emergency room she was mildly tachypneic with otherwise normal vitals.  Blood work notable for hemoglobin of 4.7.  Last hemoglobin on file 10.7 from a year prior.  Stool guaiac positive.  Covid negative chest x-ray with no acute findings.  Head CT showed right occipital lobe hypodensity consistent with acute or subacute infarct.  Blood transfusion started in the ER.  Hospitalist consulted for admission.  Review of Systems: As per HPI otherwise all other systems on review of systems negative.  Unreliable due to mild lethargy  **Interim History She was evaluated by neurology and gastroenterology.  Gastroenterology recommending no further work-up at this time and recommending following up in outpatient once she is stable from a CVA perspective.  Neurology recommending aspirin 81 mg and also outpatient follow-up with neurosurgery given her  aneurysms.  Her respiratory status is improving and she has had no overt GI bleeding.  She was given another dose of IV Lasix today and is improving.  Repeat CBC this afternoon showed a hemoglobin 8.9.  Assessment & Plan:   Principal Problem:   Acute CVA (cerebrovascular accident) [facility]) Active Problems:   Coronary artery disease   Benign hypertension   Acute on chronic blood loss anemia   Severe Symptomatic anemia   Acute metabolic encephalopathy   COPD (chronic obstructive pulmonary disease) (HCC)   Stroke (cerebrum) (HCC)  Acute/subacuteCVA (cerebrovascular accident) ([place]), in the setting of severe anemia Intracranial aneurysms/largest 10 mm Bilateral carotid stenosis -Seems to be back to baseline On admission:patient presents with confusion with right occipital lobe infarct on CT -Symptom onset 48 hours prior to arrival, outside TPA window and not a candidate either way due to positive stool guaiac and severe anemia -Echocardiogram: Reviewed and showed EF of 55 to 60%. "The left ventricle has normal function. The left ventricle has no regional wall motion abnormalities. Left ventricular diastolic parameters are  consistent with Grade I diastolicdysfunction (impaired relaxation). Right ventricular systolic function is normal. The right ventricular size is normal."  -Cardiac monitoring to evaluate for arrhythmias-negative for any dysrhythmias -MRI/MRA -consistent with aneurysms-right PICA right PCA ischemic changes Left and right ICA aneurysm largest 10 mm -Bilateral carotid study, right 70% stenosis, left 60-65% stenosis -Chest x-ray 07/15/2020 reviewed-and showed mild CHF exacerbation, atherosclerotic changes -Appreciate neurology follow-up and recommendations regarding current finding  of aneurysm Neurology Recommended  "anticoagulation with aspirin, follow-up as an outpatient with neurosurgery regarding the largest 10 mm ICA aneurysm -for potential coiling in the future versus  outpatient serial imaging in 3 to 6 months Given her age and comorbidities likely has had aneurysm for long time"  -Gastroenterology recommended and referred patient being started on aspirin to neurology and because she has no sign of any GI bleeding currently we will start her on aspirin 81 mg p.o. daily  Cute respiratory failure with hypoxia in the setting of acute COPD versus concomitant acute on chronic diastolic CHF COPD (chronic obstructive pulmonary disease) (HCC)-mild exacerbation Acute on Chronic Diastolic CHF -Improved and is not in respiratory distress this a.m. -SpO2: 98 % O2 Flow Rate (L/min): 3 L/min -Supplement fever as needed -Continue supplemental oxygen, nebs as needed; she was having Combivent and Xopenex scheduled but we have stopped the Combivent and started Xopenex and Atrovent scheduled every 6 -Mild COPD exacerbation versus mild volume overload in the setting of diastolic CHF exacerbation -Discontinue IV fluids -Lasix 40 mg x  On  9/20, 9/21; Will give an additional IV Lasix 40 mg x1 -We will monitor I's and O's, daily weights; patient is +986 mL since admission and weight is 917 -Continue to monitor and repeat chest x-ray in the a.m.  Acute onsuspectchronic Blood loss anemia/ Severe Symptomatic anemia -Status post 2 units of PRBC.   -On admission hemoglobin 4.7 ---2U PRBC transfused >> 8.1>>8.5  >> 7.8 and dropped to 7.3 this AM -She was Guaiac positive,--previously hemoglobin was >10.0 at baseline -Hemodynamically stable currently -GI consulted -believe the patient might help chronic iron deficiency, possible multiple chronic microbleed -They are not recommending upper lower endoscopy at this time and want patient to be stable from a CVA perspective -Her hemoglobin is stable and slightly trended down but there is no signs of any GI bleeding noted.  Dr. Verl Blalock feels this is anemia from chronic blood loss and he feels that there is nothing to do from a GI  perspective until she is improved from her CVA which may be few weeks.  He recommends that the patient may be treated with iron and with transfusions as needed -We have changed her ferrous sulfate 325 mg p.o. twice daily to iron disaccharides 150 mg p.o. daily -She may need infusion of IV iron -Iron panel done and showed an iron level of 14, U IBC 391, TIBC of 405, saturation ratios of 4% -Repeat blood work this afternoon showed a hemoglobin/hematocrit of 8.9/31.8 Continue to monitor for signs and symptoms of bleeding; currently no overt bleeding noted  Acute Metabolic Encephalopathy -Resolved back to baseline alert oriented x3 -Likely due to anemia, versus stroke -We will continue treating underlying causing anemia and CVA -Continue neurochecks per protocol -Continue with delirium precautions at night  Coronary artery disease -Stable denies any chest pain -Initially held aspirin but have now resumed -Reviewed home medication, resuming Imdur, beta-blockers, -Continue with simvastatin 40 mg p.o. daily that she has had a CVA  Constipation -Was having bowel issues with her p.o. iron as well as her p.o. pain medications with hydrocodone-acetaminophen -Have stopped the docusate 100 g p.o. twice daily scheduled and have changed her to senna docusate 1 tab p.o. twice daily as well as MiraLAX 17 g daily  Hyperlipidemia -Patient's total cholesterol/HDL ratio was 2.1, cholesterol level was 117, HDL 55, LDL is 47, triglycerides was 74, VLDL 15 -Continue simvastatin 40 g p.o. daily  Benign Hypertension -Blood pressures were running  low due to severe anemia -Currently Withholding Amlodipine 10 mg po Daily and Lisinopril 10 mg po Daily  -C/w Metoprolol Succinate 75 mg po Daily and with Isosorbide Dinitrate 30 mg po BID -Continue to monitor blood pressures per protocol last blood pressure is 131/63 this afternoon  Obesity -Estimated body mass index is 32.72 kg/m as calculated from the  following:   Height as of this encounter: 4\' 11"  (1.499 m).   Weight as of this encounter: 73.5 kg.  -Weight Loss and Dietary Counseling given   DVT prophylaxis: SCDs Code Status: DO NOT RESUSCITATE  Family Communication: Spoke with Son [name] at bedside Disposition Plan: SNF pending stabilization in Hgb/Hct and anticipate D/C in the AM   Status is: Inpatient  Remains inpatient appropriate because:Hemodynamically unstable and Inpatient level of care appropriate due to severity of illness   Dispo: The patient is from: Home              Anticipated d/c is to: SNF              Anticipated d/c date is: 1 day              Patient currently is not medically stable to d/c.  Consultants:   Neurology Dr. Irish Elders  Gastroenterology Dr. Allen Norris  Procedures: None  Antimicrobials:  Anti-infectives (From admission, onward)   None        Subjective: Seen and examined at bedside and she is sitting in the chair and had no complaints.  Still wearing supplemental oxygen.  Has not had any further GI bleeding but was complaining of some constipation despite having a bowel movement yesterday feels stopped up given that she is taking both iron pills and opiates.  She denies any other concerns or complaints at this time.  Objective: Vitals:   07/17/20 0755 07/17/20 0801 07/17/20 0811 07/17/20 1402  BP: (!) 144/78     Pulse: 75     Resp: 20     Temp: 97.8 F (36.6 C)     TempSrc: Oral     SpO2: 97% 90% 95% 96%  Weight:      Height:        Intake/Output Summary (Last 24 hours) at 07/17/2020 1416 Last data filed at 07/16/2020 2011 Gross per 24 hour  Intake --  Output 700 ml  Net -700 ml   Filed Weights   07/12/20 2243  Weight: 73.5 kg   Examination: Physical Exam:  Constitutional: WN/WD, NAD and appears calm and comfortable Eyes: PERRL, lids and conjunctivae normal, sclerae anicteric  ENMT: External Ears, Nose appear normal. Grossly normal hearing. Mucous membranes are moist.  Posterior pharynx clear of any exudate or lesions. Normal dentition.  Neck: Appears normal, supple, no cervical masses, normal ROM, no appreciable thyromegaly Respiratory: Clear to auscultation bilaterally, no wheezing, rales, rhonchi or crackles. Normal respiratory effort and patient is not tachypenic. No accessory muscle use.  Cardiovascular: RRR, no murmurs / rubs / gallops. S1 and S2 auscultated. No extremity edema. 2+ pedal pulses. No carotid bruits.  Abdomen: Soft, non-tender, non-distended. No masses palpated. No appreciable hepatosplenomegaly. Bowel sounds positive.  GU: Deferred. Musculoskeletal: No clubbing / cyanosis of digits/nails. No joint deformity upper and lower extremities. Good ROM, no contractures. Normal strength and muscle tone.  Skin: No rashes, lesions, ulcers. No induration; Warm and dry.  Neurologic: CN 2-12 grossly intact with no focal deficits. Sensation intact in all 4 Extremities, DTR normal. Strength 5/5 in all 4. Romberg sign cerebellar reflexes not  assessed.  Psychiatric: Normal judgment and insight. Alert and oriented x 3. Normal mood and appropriate affect.   Data Reviewed: I have personally reviewed following labs and imaging studies  CBC: Recent Labs  Lab 07/12/20 2242 07/13/20 1121 07/13/20 1950 07/14/20 0400 07/15/20 0648 07/16/20 0415 07/17/20 0506  WBC 5.3  --   --  5.9 8.5 7.7 7.2  HGB 4.7*   < > 8.6* 8.1* 8.5* 7.8* 7.3*  HCT 19.9*   < > 29.3* 28.2* 30.1* 28.1* 27.5*  MCV 64.8*  --   --  69.3* 70.8* 71.9* 75.1*  PLT 240  --   --  173 165 156 147*   < > = values in this interval not displayed.   Basic Metabolic Panel: Recent Labs  Lab 07/12/20 2242 07/14/20 0400 07/15/20 0648 07/16/20 0415 07/17/20 0506  NA 137 140 138 141 137  K 3.4* 3.4* 4.2 4.0 4.2  CL 105 109 108 106 101  CO2 25 26 23 28 29   GLUCOSE 122* 96 98 114* 93  BUN 29* 14 12 20 22   CREATININE 1.07* 0.59 0.55 0.62 0.75  CALCIUM 9.0 8.0* 8.3* 8.6* 8.6*   GFR: Estimated  Creatinine Clearance: 44.1 mL/min (by C-G formula based on SCr of 0.75 mg/dL). Liver Function Tests: Recent Labs  Lab 07/12/20 2242  AST 14*  ALT 12  ALKPHOS 55  BILITOT 0.8  PROT 6.1*  ALBUMIN 3.6   No results for input(s): LIPASE, AMYLASE in the last 168 hours. Recent Labs  Lab 07/12/20 2242  AMMONIA 14   Coagulation Profile: No results for input(s): INR, PROTIME in the last 168 hours. Cardiac Enzymes: No results for input(s): CKTOTAL, CKMB, CKMBINDEX, TROPONINI in the last 168 hours. BNP (last 3 results) No results for input(s): PROBNP in the last 8760 hours. HbA1C: No results for input(s): HGBA1C in the last 72 hours. CBG: Recent Labs  Lab 07/17/20 1133  GLUCAP 139*   Lipid Profile: No results for input(s): CHOL, HDL, LDLCALC, TRIG, CHOLHDL, LDLDIRECT in the last 72 hours. Thyroid Function Tests: No results for input(s): TSH, T4TOTAL, FREET4, T3FREE, THYROIDAB in the last 72 hours. Anemia Panel: No results for input(s): VITAMINB12, FOLATE, FERRITIN, TIBC, IRON, RETICCTPCT in the last 72 hours. Sepsis Labs: No results for input(s): PROCALCITON, LATICACIDVEN in the last 168 hours.  Recent Results (from the past 240 hour(s))  SARS Coronavirus 2 by RT PCR (hospital order, performed in [facility] hospital lab) Nasopharyngeal Nasopharyngeal Swab     Status: None   Collection Time: 07/13/20 12:09 AM   Specimen: Nasopharyngeal Swab  Result Value Ref Range Status   SARS Coronavirus 2 NEGATIVE NEGATIVE Final    Comment: (NOTE) SARS-CoV-2 target nucleic acids are NOT DETECTED.  The SARS-CoV-2 RNA is generally detectable in upper and lower respiratory specimens during the acute phase of infection. The lowest concentration of SARS-CoV-2 viral copies this assay can detect is 250 copies / mL. A negative result does not preclude SARS-CoV-2 infection and should not be used as the sole basis for treatment or other patient management decisions.  A negative result may occur  with improper specimen collection / handling, submission of specimen other than nasopharyngeal swab, presence of viral mutation(s) within the areas targeted by this assay, and inadequate number of viral copies (<250 copies / mL). A negative result must be combined with clinical observations, patient history, and epidemiological information.  Fact Sheet for Patients:   StrictlyIdeas.no  Fact Sheet for Healthcare Providers: BankingDealers.co.za  This test is  not yet approved or  cleared by the [place] and has been authorized for detection and/or diagnosis of SARS-CoV-2 by FDA under an Emergency Use Authorization (EUA).  This EUA will remain in effect (meaning this test can be used) for the duration of the COVID-19 declaration under Section 564(b)(1) of the Act, 21 U.S.C. section 360bbb-3(b)(1), unless the authorization is terminated or revoked sooner.  Performed at [facility], [address] [place] [address]      RN Pressure Injury Documentation:     Estimated body mass index is 32.72 kg/m as calculated from the following:   Height as of this encounter: 4\' 11"  (1.499 m).   Weight as of this encounter: 73.5 kg.  Malnutrition Type:  Nutrition Problem: Inadequate oral intake Etiology: decreased appetite   Malnutrition Characteristics:  Signs/Symptoms: per patient/family report   Nutrition Interventions:  Interventions: Ensure Enlive (each supplement provides 350kcal and 20 grams of protein)     Radiology Studies: No results found.  Scheduled Meds: .  stroke: mapping our early stages of recovery book   Does not apply Once  . artificial tears   Both Eyes Q8H  . aspirin EC  81 mg Oral Daily  . feeding supplement (ENSURE ENLIVE)  237 mL Oral BID BM  . Ipratropium-Albuterol  1 puff Inhalation Q6H  . iron polysaccharides  150 mg Oral Daily  . isosorbide dinitrate  30 mg Oral BID  .  levalbuterol  0.63 mg Nebulization Q6H  . metoprolol succinate  75 mg Oral Daily  . pantoprazole  40 mg Oral Daily  . polyethylene glycol  17 g Oral Daily  . senna-docusate  1 tablet Oral BID  . simvastatin  40 mg Oral Daily  . vitamin B-12  1,000 mcg Oral Daily   Continuous Infusions:   LOS: 4 days   Kerney Elbe, DO [facility] PAGER is on AMION  If 7PM-7AM, please contact night-coverage [email or web address]

## 2020-07-18 ENCOUNTER — Inpatient Hospital Stay: Payer: Medicare Other

## 2020-07-18 LAB — CBC WITH DIFFERENTIAL/PLATELET
Abs Immature Granulocytes: 0.04 10*3/uL (ref 0.00–0.07)
Basophils Absolute: 0 10*3/uL (ref 0.0–0.1)
Basophils Relative: 1 %
Eosinophils Absolute: 0.1 10*3/uL (ref 0.0–0.5)
Eosinophils Relative: 2 %
HCT: 28 % — ABNORMAL LOW (ref 36.0–46.0)
Hemoglobin: 7.6 g/dL — ABNORMAL LOW (ref 12.0–15.0)
Immature Granulocytes: 1 %
Lymphocytes Relative: 19 %
Lymphs Abs: 1.4 10*3/uL (ref 0.7–4.0)
MCH: 20.2 pg — ABNORMAL LOW (ref 26.0–34.0)
MCHC: 27.1 g/dL — ABNORMAL LOW (ref 30.0–36.0)
MCV: 74.3 fL — ABNORMAL LOW (ref 80.0–100.0)
Monocytes Absolute: 0.7 10*3/uL (ref 0.1–1.0)
Monocytes Relative: 10 %
Neutro Abs: 4.8 10*3/uL (ref 1.7–7.7)
Neutrophils Relative %: 67 %
Platelets: 148 10*3/uL — ABNORMAL LOW (ref 150–400)
RBC: 3.77 MIL/uL — ABNORMAL LOW (ref 3.87–5.11)
RDW: 26.8 % — ABNORMAL HIGH (ref 11.5–15.5)
Smear Review: NORMAL
WBC: 7.1 10*3/uL (ref 4.0–10.5)
nRBC: 0 % (ref 0.0–0.2)

## 2020-07-18 LAB — MAGNESIUM: Magnesium: 2.3 mg/dL (ref 1.7–2.4)

## 2020-07-18 LAB — COMPREHENSIVE METABOLIC PANEL
ALT: 11 U/L (ref 0–44)
AST: 12 U/L — ABNORMAL LOW (ref 15–41)
Albumin: 2.7 g/dL — ABNORMAL LOW (ref 3.5–5.0)
Alkaline Phosphatase: 43 U/L (ref 38–126)
Anion gap: 8 (ref 5–15)
BUN: 27 mg/dL — ABNORMAL HIGH (ref 8–23)
CO2: 30 mmol/L (ref 22–32)
Calcium: 8.7 mg/dL — ABNORMAL LOW (ref 8.9–10.3)
Chloride: 101 mmol/L (ref 98–111)
Creatinine, Ser: 0.63 mg/dL (ref 0.44–1.00)
GFR calc Af Amer: >60 mL/min (ref >60)
GFR calc non Af Amer: >60 mL/min (ref >60)
Glucose, Bld: 113 mg/dL — ABNORMAL HIGH (ref 70–99)
Potassium: 3.7 mmol/L (ref 3.5–5.1)
Sodium: 139 mmol/L (ref 135–145)
Total Bilirubin: 0.6 mg/dL (ref 0.3–1.2)
Total Protein: 5 g/dL — ABNORMAL LOW (ref 6.5–8.1)

## 2020-07-18 LAB — BRAIN NATRIURETIC PEPTIDE: B Natriuretic Peptide: 339.1 pg/mL — ABNORMAL HIGH (ref 0.0–100.0)

## 2020-07-18 LAB — PHOSPHORUS: Phosphorus: 4 mg/dL (ref 2.5–4.6)

## 2020-07-18 MED ORDER — IPRATROPIUM BROMIDE 0.02 % IN SOLN
0.5000 mg | Freq: Three times a day (TID) | RESPIRATORY_TRACT | Status: DC
  Administered 2020-07-18 – 2020-07-23 (×12): 0.5 mg via RESPIRATORY_TRACT
  Filled 2020-07-18 (×12): qty 2.5

## 2020-07-18 MED ORDER — LEVALBUTEROL HCL 0.63 MG/3ML IN NEBU
0.6300 mg | INHALATION_SOLUTION | Freq: Three times a day (TID) | RESPIRATORY_TRACT | Status: DC
  Administered 2020-07-18 – 2020-07-23 (×12): 0.63 mg via RESPIRATORY_TRACT
  Filled 2020-07-18 (×16): qty 3

## 2020-07-18 MED ORDER — SPIRONOLACTONE 25 MG PO TABS
25.0000 mg | ORAL_TABLET | Freq: Every day | ORAL | Status: DC
  Administered 2020-07-18 – 2020-07-24 (×7): 25 mg via ORAL
  Filled 2020-07-18 (×7): qty 1

## 2020-07-18 MED ORDER — FUROSEMIDE 10 MG/ML IJ SOLN
40.0000 mg | Freq: Once | INTRAMUSCULAR | Status: AC
  Administered 2020-07-18: 40 mg via INTRAVENOUS
  Filled 2020-07-18: qty 4

## 2020-07-18 NOTE — Progress Notes (Signed)
Physical Therapy Treatment Patient Details Name: Ashley [name] MRN: [id number] DOB: 1931-04-10 Today's Date: 07/18/2020    History of Present Illness Per MD note:Ashley [name] is a 86 y.o. female with medical history significant for CAD, COPD, HTN who presents to the emergency room with altered mental status x2 days.  Most of the history is taken from the son who states that he noticed that his mother was lethargic and not her usual self and appeared to not recognize him and his symptoms worsened into today prompting him to bring her into the emergency room.  He states that she had been complaining of weakness for the past 2 weeks which she attributed to her age.  She saw her PCP 3 weeks prior and had blood work done and also recently saw her cardiologist.  He also states that she has been having hip pain and has been seeing the orthopedist and taking Tylenol but no NSAIDs for her pain.  She has had no cough, fever chills or shortness of breath.  No reports of nausea vomiting abdominal pain or change in bowel habits and no dysuria    PT Comments    Pt in bed on arrival to room having just finished breakfast with her son bedside. She is agreeable to PT. Pt was modA+1 during bed mobility as she needed assistance with trunk, RLE, and RUE support to sit EOB. PT continues to require VCs to bring hips forward so feet are touching the ground. Pt shows improvement with sit to stand transfer as it takes her less time to stand up with a good push off from bed. Pt walked 150 feet with minimal SOB. About halfway through her walk, O2 sat level 88% on 3L and increased to 92% after VCs for breathing. Pt walked past her door and reports feeling fine but her son noticed she was making a face. Turned around and headed back in her room to her recliner chair. Once seated in recliner, pt performed more exercises and educated to continue throughout her day. Pt continues to require VC for proper mechanics of RW for safety. Pt  left in chair with all needs. She will continue to benefit from skilled PT in order to increase strength, endurance,and mobility.   Follow Up Recommendations  SNF     Equipment Recommendations  None recommended by PT    Recommendations for Other Services       Precautions / Restrictions Precautions Precautions: Fall Restrictions Weight Bearing Restrictions: No    Mobility  Bed Mobility Overal bed mobility: Needs Assistance Bed Mobility: Supine to Sit     Supine to sit: HOB elevated;Mod assist     General bed mobility comments: Pt needed more assistance today to sit EOB with LUE on bedrail and RUE on therapist's hand to help scoot forward. VCs utilized for proper hand placement and needed some assistance with her trunk and RLE  Transfers Overall transfer level: Needs assistance Equipment used: Rolling walker (2 wheeled)   Sit to Stand: Min guard         General transfer comment: Pt able to stand pushing off from bed and sit down in chair with CGA assistance for safety; VCs utilized for proper hand placement during transfer  Ambulation/Gait Ambulation/Gait assistance: Min guard Gait Distance (Feet): 150 Feet Assistive device: Rolling walker (2 wheeled) Gait Pattern/deviations: Step-through pattern;Decreased step length - right;Decreased step length - left     General Gait Details: Pt walked two lengths in the hallway before coming  back to her recliner chair. Took a break halfway through to check her O2 sat levels, 88% on 3L, increased to 92% after VCs for breathing. Pt walked past her door and reports feeling fine but her son noticed she was making a face. Turned around and headed back to her recliner chair. Pt requires VC for proper mechanics of RW for safety    Stairs             Wheelchair Mobility    Modified Rankin (Stroke Patients Only)       Balance Overall balance assessment: Needs assistance Sitting-balance support: Feet supported Sitting  balance-Leahy Scale: Good Sitting balance - Comments: Pt able to maintain balance seated EOB once feet are supported;   Standing balance support: Bilateral upper extremity supported Standing balance-Leahy Scale: Fair Standing balance comment: Pt able to maintain balance with UE support on walker                            Cognition Arousal/Alertness: Awake/alert Behavior During Therapy: WFL for tasks assessed/performed Overall Cognitive Status: History of cognitive impairments - at baseline                                 General Comments: Improved attention this date. Follows simple one step commands with increased time. Requries verbal/tactile cues for safety/sequence.      Exercises General Exercises - Lower Extremity Ankle Circles/Pumps: AROM;Both;10 reps;Seated Straight Leg Raises: AROM;Both;10 reps;Seated Hip Flexion/Marching: Both;AAROM;20 reps;Seated (assisted for full ROM) Other Exercises Other Exercises: Pt performs standing marches with BUE supported on RW    General Comments        Pertinent Vitals/Pain Pain Assessment: No/denies pain    Home Living                      Prior Function            PT Goals (current goals can now be found in the care plan section) Acute Rehab PT Goals Patient Stated Goal: to walk PT Goal Formulation: Patient unable to participate in goal setting Time For Goal Achievement: 07/27/20 Potential to Achieve Goals: Good Progress towards PT goals: Progressing toward goals    Frequency    7X/week      PT Plan Current plan remains appropriate    Co-evaluation              AM-PAC PT "6 Clicks" Mobility   Outcome Measure  Help needed turning from your back to your side while in a flat bed without using bedrails?: A Lot Help needed moving from lying on your back to sitting on the side of a flat bed without using bedrails?: A Lot Help needed moving to and from a bed to a chair  (including a wheelchair)?: A Little Help needed standing up from a chair using your arms (e.g., wheelchair or bedside chair)?: A Little Help needed to walk in hospital room?: A Little Help needed climbing 3-5 steps with a railing? : A Lot 6 Click Score: 15    End of Session Equipment Utilized During Treatment: Gait belt;Oxygen Activity Tolerance: Patient tolerated treatment well Patient left: in chair;with call bell/phone within reach;with chair alarm set;with family/visitor present Nurse Communication: Mobility status PT Visit Diagnosis: Muscle weakness (generalized) (M62.81);Difficulty in walking, not elsewhere classified (R26.2)     Time: 8527-7824 PT Time Calculation (min) (ACUTE  ONLY): 38 min  Charges:                         Noemi Chapel, SPT Bernita Raisin 07/18/2020, 11:20 AM

## 2020-07-18 NOTE — Consult Note (Signed)
Ashley [name] is a 86 y.o. female  [id number]  Primary Cardiologist: [name] Reason for Consultation: Anemia/CHF  HPI: This is a 86 year old white female with history of recent stroke and severe anemia with hemoglobin around 4 requiring blood transfusion presented to the hospital and was found to have CVA.  I was asked to evaluate the patient because of possibility of congestive heart failure.  She appears to be quite comfortable.   Review of Systems: No chest pain   Past Medical History:  Diagnosis Date  . Anemia   . Anxiety   . Arthritis   . COPD (chronic obstructive pulmonary disease) ([place])   . Coronary artery disease   . Edema    feet/ankles   wears support hose  . GERD (gastroesophageal reflux disease)   . GI (gastrointestinal bleed)    history  . Hypertension   . Myocardial infarction ([place])   . Peripheral vascular disease ([place])   . Shortness of breath dyspnea     Medications Prior to Admission  Medication Sig Dispense Refill  . ALPRAZolam (XANAX) 0.25 MG tablet Take 0.25 mg by mouth 2 (two) times daily.     Marland Kitchen amLODipine (NORVASC) 10 MG tablet Take 10 mg by mouth daily.    . Ipratropium-Albuterol (COMBIVENT RESPIMAT) 20-100 MCG/ACT AERS respimat Inhale 1 puff into the lungs every 6 (six) hours.    . isosorbide dinitrate (ISORDIL) 30 MG tablet Take 30 mg by mouth 2 (two) times daily.    Marland Kitchen lisinopril (PRINIVIL,ZESTRIL) 10 MG tablet Take 10 mg by mouth daily.     . metoprolol succinate (TOPROL-XL) 50 MG 24 hr tablet Take 75 mg by mouth daily. Take with or immediately following a meal.    . nitroGLYCERIN (NITROSTAT) 0.4 MG SL tablet Place 1 tablet (0.4 mg total) under the tongue every 5 (five) minutes as needed for chest pain. 15 tablet 2  . simvastatin (ZOCOR) 40 MG tablet Take 40 mg by mouth daily.    . vitamin B-12 (CYANOCOBALAMIN) 1000 MCG tablet Take 1,000 mcg by mouth daily.       .  stroke: mapping our early stages of recovery book   Does not apply Once   . artificial tears   Both Eyes Q8H  . aspirin EC  81 mg Oral Daily  . feeding supplement (ENSURE ENLIVE)  237 mL Oral BID BM  . furosemide  40 mg Intravenous Once  . ipratropium  0.5 mg Nebulization Q6H  . iron polysaccharides  150 mg Oral Daily  . isosorbide dinitrate  30 mg Oral BID  . levalbuterol  0.63 mg Nebulization Q6H  . metoprolol succinate  75 mg Oral Daily  . pantoprazole  40 mg Oral Daily  . polyethylene glycol  17 g Oral Daily  . senna-docusate  1 tablet Oral BID  . simvastatin  40 mg Oral Daily  . vitamin B-12  1,000 mcg Oral Daily    Infusions:   No Known Allergies  Social History   Socioeconomic History  . Marital status: Widowed    Spouse name: Not on file  . Number of children: Not on file  . Years of education: Not on file  . Highest education level: Not on file  Occupational History  . Not on file  Tobacco Use  . Smoking status: Former Smoker    Quit date: 02/11/1997    Years since quitting: 23.4  . Smokeless tobacco: Never Used  Vaping Use  . Vaping Use: Never used  Substance and Sexual Activity  . Alcohol use: No  . Drug use: No  . Sexual activity: Not on file  Other Topics Concern  . Not on file  Social History Narrative  . Not on file   Social Determinants of Health   Financial Resource Strain:   . Difficulty of Paying Living Expenses: Not on file  Food Insecurity:   . Worried About Charity fundraiser in the Last Year: Not on file  . Ran Out of Food in the Last Year: Not on file  Transportation Needs:   . Lack of Transportation (Medical): Not on file  . Lack of Transportation (Non-Medical): Not on file  Physical Activity:   . Days of Exercise per Week: Not on file  . Minutes of Exercise per Session: Not on file  Stress:   . Feeling of Stress : Not on file  Social Connections:   . Frequency of Communication with Friends and Family: Not on file  . Frequency of Social Gatherings with Friends and Family: Not on file  . Attends  Religious Services: Not on file  . Active Member of Clubs or Organizations: Not on file  . Attends Archivist Meetings: Not on file  . Marital Status: Not on file  Intimate Partner Violence:   . Fear of Current or Ex-Partner: Not on file  . Emotionally Abused: Not on file  . Physically Abused: Not on file  . Sexually Abused: Not on file    History reviewed. No pertinent family history.  PHYSICAL EXAM: Vitals:   07/18/20 0155 07/18/20 0719  BP:  135/68  Pulse:  88  Resp:  20  Temp:  97.8 F (36.6 C)  SpO2: 99% 100%     Intake/Output Summary (Last 24 hours) at 07/18/2020 0906 Last data filed at 07/18/2020 0500 Gross per 24 hour  Intake --  Output 1000 ml  Net -1000 ml    General:  Well appearing. No respiratory difficulty HEENT: normal Neck: supple. no JVD. Carotids 2+ bilat; no bruits. No lymphadenopathy or thryomegaly appreciated. Cor: PMI nondisplaced. Regular rate & rhythm. No rubs, gallops or murmurs. Lungs: clear Abdomen: soft, nontender, nondistended. No hepatosplenomegaly. No bruits or masses. Good bowel sounds. Extremities: no cyanosis, clubbing, rash, edema Neuro: alert & oriented x 3, cranial nerves grossly intact. moves all 4 extremities w/o difficulty. Affect pleasant.  ECG: Sinus tachycardia with nonspecific ST-T changes on EKG 2019 no recent EKG.  Results for orders placed or performed during the hospital encounter of 07/12/20 (from the past 24 hour(s))  Glucose, capillary     Status: Abnormal   Collection Time: 07/17/20 11:33 AM  Result Value Ref Range   Glucose-Capillary 139 (H) 70 - 99 mg/dL  CBC with Differential/Platelet     Status: Abnormal   Collection Time: 07/17/20  2:50 PM  Result Value Ref Range   WBC 8.9 4.0 - 10.5 K/uL   RBC 4.37 3.87 - 5.11 MIL/uL   Hemoglobin 8.9 (L) 12.0 - 15.0 g/dL   HCT 31.8 (L) 36 - 46 %   MCV 72.8 (L) 80.0 - 100.0 fL   MCH 20.4 (L) 26.0 - 34.0 pg   MCHC 28.0 (L) 30.0 - 36.0 g/dL   RDW 26.2 (H) 11.5 -  15.5 %   Platelets 160 150 - 400 K/uL   nRBC 0.3 (H) 0.0 - 0.2 %   Neutrophils Relative % 68 %   Neutro Abs 6.1 1.7 - 7.7 K/uL   Lymphocytes Relative 19 %  Lymphs Abs 1.7 0.7 - 4.0 K/uL   Monocytes Relative 10 %   Monocytes Absolute 0.9 0 - 1 K/uL   Eosinophils Relative 1 %   Eosinophils Absolute 0.1 0 - 0 K/uL   Basophils Relative 1 %   Basophils Absolute 0.0 0 - 0 K/uL   WBC Morphology MILD LEFT SHIFT (1-5% METAS, OCC MYELO, OCC BANDS)    RBC Morphology See Note    Smear Review Normal platelet morphology    Immature Granulocytes 1 %   Abs Immature Granulocytes 0.04 0.00 - 0.07 K/uL  Glucose, capillary     Status: Abnormal   Collection Time: 07/17/20  4:46 PM  Result Value Ref Range   Glucose-Capillary 120 (H) 70 - 99 mg/dL  CBC with Differential/Platelet     Status: Abnormal   Collection Time: 07/18/20  4:43 AM  Result Value Ref Range   WBC 7.1 4.0 - 10.5 K/uL   RBC 3.77 (L) 3.87 - 5.11 MIL/uL   Hemoglobin 7.6 (L) 12.0 - 15.0 g/dL   HCT 28.0 (L) 36 - 46 %   MCV 74.3 (L) 80.0 - 100.0 fL   MCH 20.2 (L) 26.0 - 34.0 pg   MCHC 27.1 (L) 30.0 - 36.0 g/dL   RDW 26.8 (H) 11.5 - 15.5 %   Platelets 148 (L) 150 - 400 K/uL   nRBC 0.0 0.0 - 0.2 %   Neutrophils Relative % 67 %   Neutro Abs 4.8 1.7 - 7.7 K/uL   Lymphocytes Relative 19 %   Lymphs Abs 1.4 0.7 - 4.0 K/uL   Monocytes Relative 10 %   Monocytes Absolute 0.7 0 - 1 K/uL   Eosinophils Relative 2 %   Eosinophils Absolute 0.1 0 - 0 K/uL   Basophils Relative 1 %   Basophils Absolute 0.0 0 - 0 K/uL   WBC Morphology MORPHOLOGY UNREMARKABLE    Smear Review Normal platelet morphology    Immature Granulocytes 1 %   Abs Immature Granulocytes 0.04 0.00 - 0.07 K/uL   Polychromasia PRESENT    Spherocytes PRESENT   Comprehensive metabolic panel     Status: Abnormal   Collection Time: 07/18/20  4:43 AM  Result Value Ref Range   Sodium 139 135 - 145 mmol/L   Potassium 3.7 3.5 - 5.1 mmol/L   Chloride 101 98 - 111 mmol/L   CO2 30  22 - 32 mmol/L   Glucose, Bld 113 (H) 70 - 99 mg/dL   BUN 27 (H) 8 - 23 mg/dL   Creatinine, Ser 0.63 0.44 - 1.00 mg/dL   Calcium 8.7 (L) 8.9 - 10.3 mg/dL   Total Protein 5.0 (L) 6.5 - 8.1 g/dL   Albumin 2.7 (L) 3.5 - 5.0 g/dL   AST 12 (L) 15 - 41 U/L   ALT 11 0 - 44 U/L   Alkaline Phosphatase 43 38 - 126 U/L   Total Bilirubin 0.6 0.3 - 1.2 mg/dL   GFR calc non Af Amer >60 >60 mL/min   GFR calc Af Amer >60 >60 mL/min   Anion gap 8 5 - 15  Phosphorus     Status: None   Collection Time: 07/18/20  4:43 AM  Result Value Ref Range   Phosphorus 4.0 2.5 - 4.6 mg/dL  Magnesium     Status: None   Collection Time: 07/18/20  4:43 AM  Result Value Ref Range   Magnesium 2.3 1.7 - 2.4 mg/dL  Brain natriuretic peptide     Status: Abnormal  Collection Time: 07/18/20  4:43 AM  Result Value Ref Range   B Natriuretic Peptide 339.1 (H) 0.0 - 100.0 pg/mL   DG Chest 1 View  Result Date: 07/18/2020 CLINICAL DATA:  Dyspnea EXAM: CHEST  1 VIEW COMPARISON:  07/15/2020 FINDINGS: Small to moderate bilateral pleural effusions are again identified, left greater than right, with associated bibasilar atelectasis. Superimposed interstitial pulmonary edema persists, likely cardiogenic in nature, slightly improved since prior examination. No pneumothorax. Mild to moderate cardiomegaly is unchanged. IMPRESSION: Slight interval improvement in cardiogenic failure. Persistent small to moderate bilateral pleural effusions. Electronically Signed   By: Fidela Salisbury MD   On: 07/18/2020 04:22     ASSESSMENT AND PLAN: Patient has heart failure with preserved ejection fraction.  Advise adding spironolactone 25 mg once a day which has been shown to improve symptoms in heart failure with ejection fraction which is preserved.  If EGD or colonoscopy is needed advised proceeding. Rucker Pridgeon A

## 2020-07-18 NOTE — Progress Notes (Signed)
PROGRESS NOTE    Ashley [name]  VHQ:[id number] DOB: [birth year] DOA: 07/12/2020 PCP: Albina Billet, MD   Brief Narrative:  HPI per Dr. Judd Gaudier on 07/13/20 HPI: Ashley [name] is a 86 y.o. female with medical history significant for CAD, COPD, HTN who presents to the emergency room with altered mental status x2 days.  Most of the history is taken from the son who states that he noticed that his mother was lethargic and not her usual self and appeared to not recognize him and his symptoms worsened into today prompting him to bring her into the emergency room.  He states that she had been complaining of weakness for the past 2 weeks which she attributed to her age.  She saw her PCP 3 weeks prior and had blood work done and also recently saw her cardiologist.  He also states that she has been having hip pain and has been seeing the orthopedist and taking Tylenol but no NSAIDs for her pain.  She has had no cough, fever chills or shortness of breath.  No reports of nausea vomiting abdominal pain or change in bowel habits and no dysuria ED Course: On arrival in the emergency room she was mildly tachypneic with otherwise normal vitals.  Blood work notable for hemoglobin of 4.7.  Last hemoglobin on file 10.7 from a year prior.  Stool guaiac positive.  Covid negative chest x-ray with no acute findings.  Head CT showed right occipital lobe hypodensity consistent with acute or subacute infarct.  Blood transfusion started in the ER.  Hospitalist consulted for admission.  Review of Systems: As per HPI otherwise all other systems on review of systems negative.  Unreliable due to mild lethargy  **Interim History She was evaluated by neurology and gastroenterology.  Gastroenterology recommending no further work-up at this time and recommending following up in outpatient once she is stable from a CVA perspective.  Neurology recommending aspirin 81 mg and also outpatient follow-up with neurosurgery given her  aneurysms.  Her respiratory status is improving and she has had no overt GI bleeding.  She was given another dose of IV Lasix today and is improving still remains volume overloaded so we have consulted Cardiology for further evaluation.  Repeat CBC yesterday afternoon showed a hemoglobin 8.9 today is back down to 7.6.  Assessment & Plan:   Principal Problem:   Acute CVA (cerebrovascular accident) [facility]) Active Problems:   Coronary artery disease   Benign hypertension   Acute on chronic blood loss anemia   Severe Symptomatic anemia   Acute metabolic encephalopathy   COPD (chronic obstructive pulmonary disease) (HCC)   Stroke (cerebrum) (HCC)  Acute/subacuteCVA (cerebrovascular accident) ([place]), in the setting of severe anemia Intracranial aneurysms/largest 10 mm Bilateral carotid stenosis -Seems to be back to baseline On admission:patient presents with confusion with right occipital lobe infarct on CT -Symptom onset 48 hours prior to arrival, outside TPA window and not a candidate either way due to positive stool guaiac and severe anemia -Echocardiogram: Reviewed and showed EF of 55 to 60%. "The left ventricle has normal function. The left ventricle has no regional wall motion abnormalities. Left ventricular diastolic parameters are  consistent with Grade I diastolicdysfunction (impaired relaxation). Right ventricular systolic function is normal. The right ventricular size is normal."  -Cardiac monitoring to evaluate for arrhythmias-negative for any dysrhythmias -MRI/MRA -consistent with aneurysms-right PICA right PCA ischemic changes Left and right ICA aneurysm largest 10 mm -Bilateral carotid study, right 70% stenosis, left 60-65% stenosis -  Chest x-ray 07/15/2020 reviewed-and showed mild CHF exacerbation, atherosclerotic changes -Appreciate neurology follow-up and recommendations regarding current finding of aneurysm Neurology Recommended  "anticoagulation with aspirin, follow-up as an  outpatient with neurosurgery regarding the largest 10 mm ICA aneurysm -for potential coiling in the future versus outpatient serial imaging in 3 to 6 months Given her age and comorbidities likely has had aneurysm for long time"  -Gastroenterology recommended and referred patient being started on aspirin to neurology and because she has no sign of any GI bleeding currently we will start her on aspirin 81 mg p.o. daily  Acute Respiratory Failure with Hypoxia in the setting of acute COPD versus concomitant Acute on Chronic Diastolic CHF COPD (chronic obstructive pulmonary disease) (HCC)-mild exacerbation Acute on Chronic Diastolic CHF -Improved and is not in respiratory distress this a.m. -SpO2: 100 % O2 Flow Rate (L/min): 3 L/min -Supplement fever as needed -Continue supplemental oxygen, nebs as needed; she was having Combivent and Xopenex scheduled but we have stopped the Combivent and started Xopenex and Atrovent scheduled every 6 -Mild COPD exacerbation versus volume overload in the setting of diastolic CHF exacerbation -Discontinue IV fluids -Lasix 40 mg x  On  9/20, 9/21, 9/22; Will give an additional IV Lasix 40 mg x1 this a.m. and will also give an additional dose this p.m. -We will monitor I's and O's, daily weights; patient is + 1000, 176.3 mL since admission and weight is up 14 pounds from admission -We will also fluid restrict the patient to 1500 mL -BNP this morning was 339.1 -We have consulted cardiology for further evaluation recommendations and cardiology started the patient on spironolactone 25 mg daily -Chest x-ray this morning showed "Slight interval improvement in cardiogenic failure. Persistent small to moderate bilateral pleural effusions." -Continue to monitor and repeat chest x-ray in the a.m.  Acute onsuspectchronic Blood loss anemia/ Severe Symptomatic anemia -Status post 2 units of PRBC.   -On admission hemoglobin 4.7 ---2U PRBC transfused >> 8.1>>8.5  >> 7.8 and  dropped to 7.3 yesterday AM and yesterday afternoon as repeated and it was 8.9.  Today it is 7.6 -She was Guaiac positive,--previously hemoglobin was >10.0 at baseline -Hemodynamically stable currently -GI consulted and they believe the patient might help chronic iron deficiency, possible multiple chronic microbleed -They are not recommending upper lower endoscopy at this time and want patient to be stable from a CVA perspective -Her hemoglobin is stable and slightly trended down but there is no signs of any GI bleeding noted.  Dr. Verl Blalock feels this is anemia from chronic blood loss and he feels that there is nothing to do from a GI perspective until she is improved from her CVA which may be few weeks.  He recommends that the patient may be treated with iron and with transfusions as needed -We have changed her ferrous sulfate 325 mg p.o. twice daily to iron disaccharides 150 mg p.o. daily -She may need infusion of IV iron -Iron panel done and showed an iron level of 14, U IBC 391, TIBC of 405, saturation ratios of 4% -Repeat blood work yesterday afternoon showed a hemoglobin/hematocrit of 8.9/31.8 this morning her hemoglobin/hematocrit was 7.6/28.0 and relatively stable for last few days Continue to monitor for signs and symptoms of bleeding; currently no overt bleeding noted  Acute Metabolic Encephalopathy -Resolved back to baseline alert oriented x3 -Likely due to anemia, versus stroke -We will continue treating underlying causing anemia and CVA -Continue neurochecks per protocol -Continue with delirium precautions at night  Coronary artery  disease -Stable denies any chest pain -Initially held aspirin but have now resumed -Reviewed home medication, resuming Imdur, beta-blockers, -Continue with simvastatin 40 mg p.o. daily that she has had a CVA -Cardiology has been consulted for further evaluation recommendations  Constipation -Was having bowel issues with her p.o. iron as well as her  p.o. pain medications with hydrocodone-acetaminophen -Have stopped the docusate 100 g p.o. twice daily scheduled and have changed her to senna docusate 1 tab p.o. twice daily as well as MiraLAX 17 g daily  Hyperlipidemia -Patient's total cholesterol/HDL ratio was 2.1, cholesterol level was 117, HDL 55, LDL is 47, triglycerides was 74, VLDL 15 -Continue simvastatin 40 g p.o. daily  Benign Hypertension -Blood pressures were running low due to severe anemia -Currently Withholding Amlodipine 10 mg po Daily and Lisinopril 10 mg po Daily -Cardiology also added spironolactone 25 mg p.o. daily -C/w Metoprolol Succinate 75 mg po Daily and with Isosorbide Dinitrate 30 mg po BID -Continue to monitor blood pressures per protocol last blood pressure is 135/68 this afternoon  Obesity -Estimated body mass index is 35.67 kg/m as calculated from the following:   Height as of this encounter: 4\' 11"  (1.499 m).   Weight as of this encounter: 80.1 kg.  -Weight Loss and Dietary Counseling given   DVT prophylaxis: SCDs Code Status: DO NOT RESUSCITATE  Family Communication: Spoke with Son [name] at bedside Disposition Plan: SNF pending stabilization in Hgb/Hct and her heart failure and anticipating discharge in next 1 to 2 days  Status is: Inpatient  Remains inpatient appropriate because:Hemodynamically unstable and Inpatient level of care appropriate due to severity of illness   Dispo: The patient is from: Home              Anticipated d/c is to: SNF              Anticipated d/c date is: 1-2 days              Patient currently is not medically stable to d/c.  Consultants:   Neurology Dr. Irish Elders  Gastroenterology Dr. Allen Norris  Cardiology Dr. Humphrey Rolls  Procedures: None  Antimicrobials:  Anti-infectives (From admission, onward)   None        Subjective: Seen and examined at bedside and she was a little dyspneic and felt puffy.  States that she slept for 2 hours last night.  Had a bowel  movement yesterday afternoon but has not had one this morning.  No chest pain, lightheadedness or dizziness.  Objective: Vitals:   07/18/20 0035 07/18/20 0155 07/18/20 0500 07/18/20 0719  BP: 139/64   135/68  Pulse: 80   88  Resp: 19   20  Temp: 98.1 F (36.7 C)   97.8 F (36.6 C)  TempSrc: Oral   Oral  SpO2: 99% 99%  100%  Weight:   80.1 kg   Height:        Intake/Output Summary (Last 24 hours) at 07/18/2020 1743 Last data filed at 07/18/2020 1419 Gross per 24 hour  Intake 1190 ml  Output 1000 ml  Net 190 ml   Filed Weights   07/12/20 2243 07/18/20 0500  Weight: 73.5 kg 80.1 kg   Examination: Physical Exam:  Constitutional: WN/WD obese Caucasian elderly female currently in NAD and appears calm but a little dyspneic today Eyes: Lids and conjunctivae normal, sclerae anicteric  ENMT: External Ears, Nose appear normal. Grossly normal hearing. Neck: Appears normal, supple, no cervical masses, normal ROM, no appreciable thyromegaly; mild JVD Respiratory:  Diminished to auscultation bilaterally with coarse breath sounds and bilateral crackles but no appreciable wheezing or rhonchi.  Slightly elevated respiratory effort but no accessory muscle use.  Wearing supplemental oxygen via nasal cannula Cardiovascular: RRR, no murmurs / rubs / gallops. S1 and S2 auscultated.  Has 1+ pitting edema Abdomen: Soft, non-tender, distended secondary to body habitus. No masses palpated. No appreciable hepatosplenomegaly. Bowel sounds positive.  GU: Deferred. Musculoskeletal: No clubbing / cyanosis of digits/nails. No joint deformity upper and lower extremities.   Skin: No rashes, lesions, ulcers but does have some mild bruising and ecchymosis noted. No induration; Warm and dry.  Neurologic: CN 2-12 grossly intact with no focal deficits. Romberg sign and cerebellar reflexes not assessed.  Psychiatric: Normal judgment and insight. Alert and oriented x 3. Normal mood and appropriate affect.   Data  Reviewed: I have personally reviewed following labs and imaging studies  CBC: Recent Labs  Lab 07/15/20 0648 07/16/20 0415 07/17/20 0506 07/17/20 1450 07/18/20 0443  WBC 8.5 7.7 7.2 8.9 7.1  NEUTROABS  --   --   --  6.1 4.8  HGB 8.5* 7.8* 7.3* 8.9* 7.6*  HCT 30.1* 28.1* 27.5* 31.8* 28.0*  MCV 70.8* 71.9* 75.1* 72.8* 74.3*  PLT 165 156 147* 160 952*   Basic Metabolic Panel: Recent Labs  Lab 07/14/20 0400 07/15/20 0648 07/16/20 0415 07/17/20 0506 07/18/20 0443  NA 140 138 141 137 139  K 3.4* 4.2 4.0 4.2 3.7  CL 109 108 106 101 101  CO2 26 23 28 29 30   GLUCOSE 96 98 114* 93 113*  BUN 14 12 20 22  27*  CREATININE 0.59 0.55 0.62 0.75 0.63  CALCIUM 8.0* 8.3* 8.6* 8.6* 8.7*  MG  --   --   --   --  2.3  PHOS  --   --   --   --  4.0   GFR: Estimated Creatinine Clearance: 46.2 mL/min (by C-G formula based on SCr of 0.63 mg/dL). Liver Function Tests: Recent Labs  Lab 07/12/20 2242 07/18/20 0443  AST 14* 12*  ALT 12 11  ALKPHOS 55 43  BILITOT 0.8 0.6  PROT 6.1* 5.0*  ALBUMIN 3.6 2.7*   No results for input(s): LIPASE, AMYLASE in the last 168 hours. Recent Labs  Lab 07/12/20 2242  AMMONIA 14   Coagulation Profile: No results for input(s): INR, PROTIME in the last 168 hours. Cardiac Enzymes: No results for input(s): CKTOTAL, CKMB, CKMBINDEX, TROPONINI in the last 168 hours. BNP (last 3 results) No results for input(s): PROBNP in the last 8760 hours. HbA1C: No results for input(s): HGBA1C in the last 72 hours. CBG: Recent Labs  Lab 07/17/20 1133 07/17/20 1646  GLUCAP 139* 120*   Lipid Profile: No results for input(s): CHOL, HDL, LDLCALC, TRIG, CHOLHDL, LDLDIRECT in the last 72 hours. Thyroid Function Tests: No results for input(s): TSH, T4TOTAL, FREET4, T3FREE, THYROIDAB in the last 72 hours. Anemia Panel: No results for input(s): VITAMINB12, FOLATE, FERRITIN, TIBC, IRON, RETICCTPCT in the last 72 hours. Sepsis Labs: No results for input(s): PROCALCITON,  LATICACIDVEN in the last 168 hours.  Recent Results (from the past 240 hour(s))  SARS Coronavirus 2 by RT PCR (hospital order, performed in [facility] hospital lab) Nasopharyngeal Nasopharyngeal Swab     Status: None   Collection Time: 07/13/20 12:09 AM   Specimen: Nasopharyngeal Swab  Result Value Ref Range Status   SARS Coronavirus 2 NEGATIVE NEGATIVE Final    Comment: (NOTE) SARS-CoV-2 target nucleic acids are NOT  DETECTED.  The SARS-CoV-2 RNA is generally detectable in upper and lower respiratory specimens during the acute phase of infection. The lowest concentration of SARS-CoV-2 viral copies this assay can detect is 250 copies / mL. A negative result does not preclude SARS-CoV-2 infection and should not be used as the sole basis for treatment or other patient management decisions.  A negative result may occur with improper specimen collection / handling, submission of specimen other than nasopharyngeal swab, presence of viral mutation(s) within the areas targeted by this assay, and inadequate number of viral copies (<250 copies / mL). A negative result must be combined with clinical observations, patient history, and epidemiological information.  Fact Sheet for Patients:   StrictlyIdeas.no  Fact Sheet for Healthcare Providers: BankingDealers.co.za  This test is not yet approved or  cleared by the [place] FDA and has been authorized for detection and/or diagnosis of SARS-CoV-2 by FDA under an Emergency Use Authorization (EUA).  This EUA will remain in effect (meaning this test can be used) for the duration of the COVID-19 declaration under Section 564(b)(1) of the Act, 21 U.S.C. section 360bbb-3(b)(1), unless the authorization is terminated or revoked sooner.  Performed at [facility], [address] [place], [place] [address]      RN Pressure Injury Documentation:     Estimated body mass index is 35.67  kg/m as calculated from the following:   Height as of this encounter: 4\' 11"  (1.499 m).   Weight as of this encounter: 80.1 kg.  Malnutrition Type:  Nutrition Problem: Inadequate oral intake Etiology: decreased appetite   Malnutrition Characteristics:  Signs/Symptoms: per patient/family report   Nutrition Interventions:  Interventions: Ensure Enlive (each supplement provides 350kcal and 20 grams of protein)     Radiology Studies: DG Chest 1 View  Result Date: 07/18/2020 CLINICAL DATA:  Dyspnea EXAM: CHEST  1 VIEW COMPARISON:  07/15/2020 FINDINGS: Small to moderate bilateral pleural effusions are again identified, left greater than right, with associated bibasilar atelectasis. Superimposed interstitial pulmonary edema persists, likely cardiogenic in nature, slightly improved since prior examination. No pneumothorax. Mild to moderate cardiomegaly is unchanged. IMPRESSION: Slight interval improvement in cardiogenic failure. Persistent small to moderate bilateral pleural effusions. Electronically Signed   By: Fidela Salisbury MD   On: 07/18/2020 04:22   Scheduled Meds: .  stroke: mapping our early stages of recovery book   Does not apply Once  . artificial tears   Both Eyes Q8H  . aspirin EC  81 mg Oral Daily  . feeding supplement (ENSURE ENLIVE)  237 mL Oral BID BM  . ipratropium  0.5 mg Nebulization TID  . iron polysaccharides  150 mg Oral Daily  . isosorbide dinitrate  30 mg Oral BID  . levalbuterol  0.63 mg Nebulization TID  . metoprolol succinate  75 mg Oral Daily  . pantoprazole  40 mg Oral Daily  . polyethylene glycol  17 g Oral Daily  . senna-docusate  1 tablet Oral BID  . simvastatin  40 mg Oral Daily  . spironolactone  25 mg Oral Daily  . vitamin B-12  1,000 mcg Oral Daily   Continuous Infusions:   LOS: 5 days   Kerney Elbe, DO [facility] PAGER is on [place]  If 7PM-7AM, please contact night-coverage [email or web address]

## 2020-07-18 NOTE — Care Management Important Message (Signed)
Important Message  Patient Details  Name: Ashley [name] MRN: [id number] Date of Birth: 01-26-1931   Medicare Important Message Given:  Yes     Dannette Barbara 07/18/2020, 1:04 PM

## 2020-07-19 ENCOUNTER — Inpatient Hospital Stay: Payer: Medicare Other

## 2020-07-19 LAB — COMPREHENSIVE METABOLIC PANEL
ALT: 11 U/L (ref 0–44)
AST: 12 U/L — ABNORMAL LOW (ref 15–41)
Albumin: 2.6 g/dL — ABNORMAL LOW (ref 3.5–5.0)
Alkaline Phosphatase: 39 U/L (ref 38–126)
Anion gap: 9 (ref 5–15)
BUN: 32 mg/dL — ABNORMAL HIGH (ref 8–23)
CO2: 31 mmol/L (ref 22–32)
Calcium: 8.4 mg/dL — ABNORMAL LOW (ref 8.9–10.3)
Chloride: 99 mmol/L (ref 98–111)
Creatinine, Ser: 0.65 mg/dL (ref 0.44–1.00)
GFR calc Af Amer: >60 mL/min (ref >60)
GFR calc non Af Amer: >60 mL/min (ref >60)
Glucose, Bld: 110 mg/dL — ABNORMAL HIGH (ref 70–99)
Potassium: 3.9 mmol/L (ref 3.5–5.1)
Sodium: 139 mmol/L (ref 135–145)
Total Bilirubin: 0.7 mg/dL (ref 0.3–1.2)
Total Protein: 4.9 g/dL — ABNORMAL LOW (ref 6.5–8.1)

## 2020-07-19 LAB — CBC WITH DIFFERENTIAL/PLATELET
Abs Immature Granulocytes: 0.02 10*3/uL (ref 0.00–0.07)
Basophils Absolute: 0 10*3/uL (ref 0.0–0.1)
Basophils Relative: 1 %
Eosinophils Absolute: 0.2 10*3/uL (ref 0.0–0.5)
Eosinophils Relative: 3 %
HCT: 26.1 % — ABNORMAL LOW (ref 36.0–46.0)
Hemoglobin: 7.4 g/dL — ABNORMAL LOW (ref 12.0–15.0)
Immature Granulocytes: 0 %
Lymphocytes Relative: 23 %
Lymphs Abs: 1.4 10*3/uL (ref 0.7–4.0)
MCH: 20.7 pg — ABNORMAL LOW (ref 26.0–34.0)
MCHC: 28.4 g/dL — ABNORMAL LOW (ref 30.0–36.0)
MCV: 72.9 fL — ABNORMAL LOW (ref 80.0–100.0)
Monocytes Absolute: 0.8 10*3/uL (ref 0.1–1.0)
Monocytes Relative: 13 %
Neutro Abs: 3.8 10*3/uL (ref 1.7–7.7)
Neutrophils Relative %: 60 %
Platelets: 150 10*3/uL (ref 150–400)
RBC: 3.58 MIL/uL — ABNORMAL LOW (ref 3.87–5.11)
RDW: 27.4 % — ABNORMAL HIGH (ref 11.5–15.5)
WBC: 6.3 10*3/uL (ref 4.0–10.5)
nRBC: 0 % (ref 0.0–0.2)

## 2020-07-19 LAB — MAGNESIUM: Magnesium: 2.4 mg/dL (ref 1.7–2.4)

## 2020-07-19 LAB — PHOSPHORUS: Phosphorus: 4.3 mg/dL (ref 2.5–4.6)

## 2020-07-19 MED ORDER — FUROSEMIDE 10 MG/ML IJ SOLN
40.0000 mg | Freq: Once | INTRAMUSCULAR | Status: AC
  Administered 2020-07-19: 40 mg via INTRAVENOUS
  Filled 2020-07-19: qty 4

## 2020-07-19 NOTE — Progress Notes (Signed)
Physical Therapy Treatment Patient Details Name: Ashley [name] MRN: [id number] DOB: 07-Aug-1931 Today's Date: 07/19/2020    History of Present Illness Per MD note:Ashley [name] is a 86 y.o. female with medical history significant for CAD, COPD, HTN who presents to the emergency room with altered mental status x2 days.  Most of the history is taken from the son who states that he noticed that his mother was lethargic and not her usual self and appeared to not recognize him and his symptoms worsened into today prompting him to bring her into the emergency room.  He states that she had been complaining of weakness for the past 2 weeks which she attributed to her age.  She saw her PCP 3 weeks prior and had blood work done and also recently saw her cardiologist.  He also states that she has been having hip pain and has been seeing the orthopedist and taking Tylenol but no NSAIDs for her pain.  She has had no cough, fever chills or shortness of breath.  No reports of nausea vomiting abdominal pain or change in bowel habits and no dysuria    PT Comments    Pt presents in fowler's position in bed with son at bedside on arrival to room. Pt is agreeable to PT. Pt was modA+1 during bed mobility as she needed assistance with trunk, RLE, and RUE support to sit EOB. PT continues to require VCs to bring hips forward so feet are touching the ground. Pt continues to improve with sit to stand transfer as she has good push off from bed. During ambulation, pt was CGA+2 with chair follow to allow for increased walking distance and safety. Pt walked almost all the way around the nurses station broken up by two seated rest breaks. O2 sat stayed between 96% and 100% 2L during ambulation. Pt denies SOB but feels tired. Pt pushed in recliner chair the rest of the way into her room where she performed more exercises and educated to continue throughout her day. During exercises, O2 sat levels stayed between 96 and 99%. Pt continues  to require VC for proper mechanics of RW for safety and demonstrates decreased strength affected by stroke. Pt left in chair with all needs. Nursing staff notified to put a pure wick in as hers was soiled and she needed to use the bathroom. She will continue to benefit from skilled PT in order to increase strength, endurance,and mobility.   Follow Up Recommendations  SNF     Equipment Recommendations  None recommended by PT    Recommendations for Other Services       Precautions / Restrictions Precautions Precautions: Fall Restrictions Weight Bearing Restrictions: No    Mobility  Bed Mobility Overal bed mobility: Needs Assistance Bed Mobility: Supine to Sit     Supine to sit: HOB elevated;Mod assist     General bed mobility comments: She needed moderate help with UE, LE, and trunk support as she used bedrail to sit EOB. VCs utilized for proper hand placement  Transfers Overall transfer level: Needs assistance Equipment used: Rolling walker (2 wheeled) Transfers: Sit to/from Stand Sit to Stand: Min guard         General transfer comment: Pt able to stand pushing off from bed and sit down in chair with CGA assistance for safety; VCs utilized for proper hand placement during transfer  Ambulation/Gait Ambulation/Gait assistance: Min guard;+2 safety/equipment Gait Distance (Feet): 200 Feet Assistive device: Rolling walker (2 wheeled) Gait Pattern/deviations: Step-through pattern;Decreased step  length - right;Decreased step length - left     General Gait Details: Pt was CGA+2 with chair follow to allow for increased walking distance and safety. Pt walked almost all the way around the nurses station with two seated rest breaks due to fatigue. O2 sat stayed between 96% and 100% 2L during ambulation. Pt denies SOB but feels tired demonstrating decreased endurance   Stairs             Wheelchair Mobility    Modified Rankin (Stroke Patients Only)       Balance  Overall balance assessment: Needs assistance Sitting-balance support: Feet supported Sitting balance-Leahy Scale: Good Sitting balance - Comments: Pt able to maintain balance seated EOB once feet are supported;   Standing balance support: Bilateral upper extremity supported Standing balance-Leahy Scale: Fair Standing balance comment: Pt able to maintain balance with UE support on walker                            Cognition Arousal/Alertness: Awake/alert Behavior During Therapy: WFL for tasks assessed/performed Overall Cognitive Status: History of cognitive impairments - at baseline                                 General Comments: Improved attention this date. Follows simple one step commands with increased time. Requries verbal/tactile cues for safety/sequence.      Exercises General Exercises - Lower Extremity Ankle Circles/Pumps: AROM;Both;10 reps;Seated Long Arc Quad: AROM;Both;10 reps;Seated Hip Flexion/Marching: Both;AAROM;20 reps;Seated Other Exercises Other Exercises: Pt performs standing marches with BUE supported on RW    General Comments General comments (skin integrity, edema, etc.): Pt's face is more pale today compared to previous session      Pertinent Vitals/Pain Pain Assessment: No/denies pain    Home Living                      Prior Function            PT Goals (current goals can now be found in the care plan section) Acute Rehab PT Goals Patient Stated Goal: to walk PT Goal Formulation: Patient unable to participate in goal setting Time For Goal Achievement: 07/27/20 Potential to Achieve Goals: Good    Frequency    7X/week      PT Plan Current plan remains appropriate    Co-evaluation              AM-PAC PT "6 Clicks" Mobility   Outcome Measure  Help needed turning from your back to your side while in a flat bed without using bedrails?: A Lot Help needed moving from lying on your back to sitting  on the side of a flat bed without using bedrails?: A Lot Help needed moving to and from a bed to a chair (including a wheelchair)?: A Little Help needed standing up from a chair using your arms (e.g., wheelchair or bedside chair)?: A Little Help needed to walk in hospital room?: A Little Help needed climbing 3-5 steps with a railing? : A Lot 6 Click Score: 15    End of Session Equipment Utilized During Treatment: Gait belt;Oxygen Activity Tolerance: Patient tolerated treatment well Patient left: in chair;with call bell/phone within reach;with chair alarm set;with family/visitor present Nurse Communication: Mobility status PT Visit Diagnosis: Muscle weakness (generalized) (M62.81);Difficulty in walking, not elsewhere classified (R26.2)     Time: 6503-5465 PT  Time Calculation (min) (ACUTE ONLY): 44 min  Charges:  $Gait Training: 23-37 mins $Therapeutic Exercise: 8-22 mins                      Kimmie Jacelyn Grip, SPT Bernita Raisin 07/19/2020, 3:28 PM

## 2020-07-19 NOTE — Progress Notes (Signed)
SUBJECTIVE: Patient is a bit confused   Vitals:   07/18/20 0719 07/18/20 2003 07/19/20 0427 07/19/20 0823  BP: 135/68   132/65  Pulse: 88   85  Resp: 20   18  Temp: 97.8 F (36.6 C)   98.1 F (36.7 C)  TempSrc: Oral   Oral  SpO2: 100% 96%  97%  Weight:   79.9 kg   Height:        Intake/Output Summary (Last 24 hours) at 07/19/2020 0922 Last data filed at 07/19/2020 0258 Gross per 24 hour  Intake 1310 ml  Output 1150 ml  Net 160 ml    LABS: Basic Metabolic Panel: Recent Labs    07/18/20 0443 07/19/20 0350  NA 139 139  K 3.7 3.9  CL 101 99  CO2 30 31  GLUCOSE 113* 110*  BUN 27* 32*  CREATININE 0.63 0.65  CALCIUM 8.7* 8.4*  MG 2.3 2.4  PHOS 4.0 4.3   Liver Function Tests: Recent Labs    07/18/20 0443 07/19/20 0350  AST 12* 12*  ALT 11 11  ALKPHOS 43 39  BILITOT 0.6 0.7  PROT 5.0* 4.9*  ALBUMIN 2.7* 2.6*   No results for input(s): LIPASE, AMYLASE in the last 72 hours. CBC: Recent Labs    07/18/20 0443 07/19/20 0350  WBC 7.1 6.3  NEUTROABS 4.8 3.8  HGB 7.6* 7.4*  HCT 28.0* 26.1*  MCV 74.3* 72.9*  PLT 148* 150   Cardiac Enzymes: No results for input(s): CKTOTAL, CKMB, CKMBINDEX, TROPONINI in the last 72 hours. BNP: Invalid input(s): POCBNP D-Dimer: No results for input(s): DDIMER in the last 72 hours. Hemoglobin A1C: No results for input(s): HGBA1C in the last 72 hours. Fasting Lipid Panel: No results for input(s): CHOL, HDL, LDLCALC, TRIG, CHOLHDL, LDLDIRECT in the last 72 hours. Thyroid Function Tests: No results for input(s): TSH, T4TOTAL, T3FREE, THYROIDAB in the last 72 hours.  Invalid input(s): FREET3 Anemia Panel: No results for input(s): VITAMINB12, FOLATE, FERRITIN, TIBC, IRON, RETICCTPCT in the last 72 hours.   PHYSICAL EXAM General: Well developed, well nourished, in no acute distress HEENT:  Normocephalic and atramatic Neck:  No JVD.  Lungs: Clear bilaterally to auscultation and percussion. Heart: HRRR . Normal S1 and S2  without gallops or murmurs.  Abdomen: Bowel sounds are positive, abdomen soft and non-tender  Msk:  Back normal, normal gait. Normal strength and tone for age. Extremities: No clubbing, cyanosis or edema.   Neuro: Alert and oriented X 3. Psych:  Good affect, responds appropriately  TELEMETRY: Sinus rhythm  ASSESSMENT AND PLAN: Status post CVA with diastolic dysfunction on echocardiogram and heart failure with preserved ejection fraction.  Started patient on spironolactone 25 mg once a day yesterday which has been shown to reduce hospitalization for congestive heart failure with HFpEF.  Principal Problem:   Acute CVA (cerebrovascular accident) [facility]) Active Problems:   Coronary artery disease   Benign hypertension   Acute on chronic blood loss anemia   Severe Symptomatic anemia   Acute metabolic encephalopathy   COPD (chronic obstructive pulmonary disease) ([place])   Stroke (cerebrum) (HCC)    Ashley [name] A, MD, [facility] 07/19/2020 9:22 AM

## 2020-07-19 NOTE — Plan of Care (Signed)
  Problem: Education: Goal: Knowledge of General Education information will improve Description: Including pain rating scale, medication(s)/side effects and non-pharmacologic comfort measures Outcome: Progressing   Problem: Health Behavior/Discharge Planning: Goal: Ability to manage health-related needs will improve Outcome: Progressing   Problem: Clinical Measurements: Goal: Ability to maintain clinical measurements within normal limits will improve Outcome: Progressing Goal: Will remain free from infection Outcome: Progressing Goal: Diagnostic test results will improve Outcome: Progressing Goal: Respiratory complications will improve Outcome: Progressing Goal: Cardiovascular complication will be avoided Outcome: Progressing   Problem: Activity: Goal: Risk for activity intolerance will decrease Outcome: Progressing   Problem: Nutrition: Goal: Adequate nutrition will be maintained Outcome: Progressing   Problem: Coping: Goal: Level of anxiety will decrease Outcome: Progressing   Problem: Elimination: Goal: Will not experience complications related to bowel motility Outcome: Progressing Goal: Will not experience complications related to urinary retention Outcome: Progressing   Problem: Pain Managment: Goal: General experience of comfort will improve Outcome: Progressing   Problem: Safety: Goal: Ability to remain free from injury will improve Outcome: Progressing   Problem: Skin Integrity: Goal: Risk for impaired skin integrity will decrease Outcome: Progressing   Problem: Education: Goal: Knowledge of secondary prevention will improve Outcome: Progressing Goal: Knowledge of patient specific risk factors addressed and post discharge goals established will improve Outcome: Progressing Goal: Individualized Educational Video(s) Outcome: Progressing

## 2020-07-19 NOTE — Progress Notes (Signed)
Occupational Therapy Treatment Patient Details Name: Ashley [name] MRN: [id number] DOB: 1931/02/25 Today's Date: 07/19/2020    History of present illness Per MD note:Ashley [name] is a 86 y.o. female with medical history significant for CAD, COPD, HTN who presents to the emergency room with altered mental status x2 days.  Most of the history is taken from the son who states that he noticed that his mother was lethargic and not her usual self and appeared to not recognize him and his symptoms worsened into today prompting him to bring her into the emergency room.  He states that she had been complaining of weakness for the past 2 weeks which she attributed to her age.  She saw her PCP 3 weeks prior and had blood work done and also recently saw her cardiologist.  He also states that she has been having hip pain and has been seeing the orthopedist and taking Tylenol but no NSAIDs for her pain.  She has had no cough, fever chills or shortness of breath.  No reports of nausea vomiting abdominal pain or change in bowel habits and no dysuria   OT comments  Pt seen for OT tx this date to f/u re: safety with ADLs/ADL mobility. Pt agreeable to OT session this date to monitor O2 with standing ADLs. Pt's son present in room throughout session. OT facilitates pt participation in sit to stand with RW with CGA with MIN verbal cues for safe use of RW-hand placement. Pt demos G static standing balance. Pt performs fxl mobility with RW with CGA to sink on opposite side of room. Pt's RR noted to go up slightly with fxl mobility, but able to slow down with standing rest break x30 seconds. O2 assessed throughout. Pt on 2Lnc at start of session, but able to sustain >95% on RA at rest and >92% with standing/fxl mobility. RN notified of oxygen status and okays leaving nasal cannula off at this time. Pt standing sink-side participates in oral care with setup and CGA for standing balance and MIN cues for sequencing task. Pt then  able to perform fxl mobility back, but feels she needs to have BM. Pt transferred to [facility] with CGA, but unable to have BM at this time. Pt returned to chair at end of session. Chair alarm, all needs met. Continue to recommend SNF as safest option based on decreased safety awareness and decreased fxl activity tolerance, but if pt goes home, she will require 24/7 SUPV for safety.    Follow Up Recommendations  SNF    Equipment Recommendations  Other (comment)    Recommendations for Other Services      Precautions / Restrictions Precautions Precautions: Fall Restrictions Weight Bearing Restrictions: No       Mobility Bed Mobility     General bed mobility comments: pt up to chair pre/post session  Transfers Overall transfer level: Needs assistance Equipment used: Rolling walker (2 wheeled) Transfers: Sit to/from Stand Sit to Stand: Min guard         General transfer comment: MIN verbal cues for safe hand placement with use of RW    Balance Overall balance assessment: Needs assistance Sitting-balance support: Feet supported Sitting balance-Leahy Scale: Good   Standing balance support: Bilateral upper extremity supported Standing balance-Leahy Scale: Fair Standing balance comment: requires UE support through RW, able to maintain well with static and fxl mobility  ADL either performed or assessed with clinical judgement   ADL Overall ADL's : Needs assistance/impaired     Grooming: Oral care;Standing;Min guard Grooming Details (indicate cue type and reason): RW for CGA standing sink side to brush dentures and gums and rinse mouth. Pt with some increased RR with activity, but spO2 maintains >92% on RA throughout session. RN  notified and okay's leaving pt on RA at end of session.                             Functional mobility during ADLs: Min guard;Cueing for safety;Rolling walker       Vision Patient Visual Report: No  change from baseline     Perception     Praxis      Cognition Arousal/Alertness: Awake/alert Behavior During Therapy: WFL for tasks assessed/performed Overall Cognitive Status: History of cognitive impairments - at baseline                                 General Comments: Improved attention this date. Follows simple one step commands with increased time. Requries verbal/tactile cues for safety/sequence.        Exercises Other Exercises Other Exercises: OT facilitates pt participation in sit to stand with CGA and fxl mobility with CGA with RW to sink to perform standing grooming tasks. Pt requires 1 standing rest break both to and from chair/sink. O2 monitored throughout on RA and maintains >92%. Pt tolerates standing ~7 mins while completing standing grooming tasks. Demos improving fxl activity tolerance.   Shoulder Instructions       General Comments Pt's face is more pale today compared to previous session    Pertinent Vitals/ Pain       Pain Assessment: No/denies pain  Home Living                                          Prior Functioning/Environment              Frequency  Min 1X/week        Progress Toward Goals  OT Goals(current goals can now be found in the care plan section)  Progress towards OT goals: Progressing toward goals  Acute Rehab OT Goals Patient Stated Goal: to walk OT Goal Formulation: With patient/family Time For Goal Achievement: 07/28/20 Potential to Achieve Goals: Good  Plan Discharge plan remains appropriate    Co-evaluation                 AM-PAC OT "6 Clicks" Daily Activity     Outcome Measure   Help from another person eating meals?: A Little Help from another person taking care of personal grooming?: A Little Help from another person toileting, which includes using toliet, bedpan, or urinal?: A Lot Help from another person bathing (including washing, rinsing, drying)?: A Lot Help  from another person to put on and taking off regular upper body clothing?: A Little Help from another person to put on and taking off regular lower body clothing?: A Lot 6 Click Score: 15    End of Session Equipment Utilized During Treatment: Gait belt;Rolling walker  OT Visit Diagnosis: Other abnormalities of gait and mobility (R26.89)   Activity Tolerance Patient tolerated treatment well   Patient Left with call bell/phone within reach;in  chair;with chair alarm set;with family/visitor present;Other (comment)   Nurse Communication Other (comment) (notified RN of pt's O2 status with fxl mobility and standing fxl tasks. RN agreeable to leaving supplemental oxygen off at this time)        Time: 7014-1030 OT Time Calculation (min): 46 min  Charges: OT General Charges $OT Visit: 1 Visit OT Treatments $Self Care/Home Management : 23-37 mins $Therapeutic Activity: 8-22 mins  Gerrianne Scale, MS, OTR/L ascom [phone] 07/19/20, 5:10 PM

## 2020-07-19 NOTE — Progress Notes (Signed)
PROGRESS NOTE    Ashley [name]  EXB:[id number] DOB: 1931-04-24 DOA: 07/12/2020 PCP: Albina Billet, MD   Brief Narrative:  HPI per Dr. Judd Gaudier on 07/13/20 HPI: Ashley [name] is a 86 y.o. female with medical history significant for CAD, COPD, HTN who presents to the emergency room with altered mental status x2 days.  Most of the history is taken from the son who states that he noticed that his mother was lethargic and not her usual self and appeared to not recognize him and his symptoms worsened into today prompting him to bring her into the emergency room.  He states that she had been complaining of weakness for the past 2 weeks which she attributed to her age.  She saw her PCP 3 weeks prior and had blood work done and also recently saw her cardiologist.  He also states that she has been having hip pain and has been seeing the orthopedist and taking Tylenol but no NSAIDs for her pain.  She has had no cough, fever chills or shortness of breath.  No reports of nausea vomiting abdominal pain or change in bowel habits and no dysuria ED Course: On arrival in the emergency room she was mildly tachypneic with otherwise normal vitals.  Blood work notable for hemoglobin of 4.7.  Last hemoglobin on file 10.7 from a year prior.  Stool guaiac positive.  Covid negative chest x-ray with no acute findings.  Head CT showed right occipital lobe hypodensity consistent with acute or subacute infarct.  Blood transfusion started in the ER.  Hospitalist consulted for admission.  Review of Systems: As per HPI otherwise all other systems on review of systems negative.  Unreliable due to mild lethargy  **Interim History She was evaluated by neurology and gastroenterology.  Gastroenterology recommending no further work-up at this time and recommending following up in outpatient once she is stable from a CVA perspective.  Neurology recommending aspirin 81 mg and also outpatient follow-up with neurosurgery given her  aneurysms.  Her respiratory status is improving and she has had no overt GI bleeding.  She was given another dose of IV Lasix today and is improving still remains volume overloaded so we have consulted Cardiology for further evaluation.  Repeat CBC today showed Hgb of 7.4.  Continues to be volume overloaded so we will continue IV diuresis with IV IV Lasix 40 twice daily and spironolactone for 5 mg p.o. daily per cardiology recommendations.  We have recommended for restriction to 1500 mL per if she continues to not lose weight and if she continues to not as strict as noted we may cut that back down to 1200.  Currently remains on supplemental oxygen via nasal cannula  Assessment & Plan:   Principal Problem:   Acute CVA (cerebrovascular accident) ([place]) Active Problems:   Coronary artery disease   Benign hypertension   Acute on chronic blood loss anemia   Severe Symptomatic anemia   Acute metabolic encephalopathy   COPD (chronic obstructive pulmonary disease) ([facility])   Stroke (cerebrum) ([facility])  Acute/subacuteCVA (cerebrovascular accident) ([place]), in the setting of severe anemia Intracranial aneurysms/largest 10 mm Bilateral carotid stenosis -Seems to be back to baseline On admission:patient presents with confusion with right occipital lobe infarct on CT -Symptom onset 48 hours prior to arrival, outside TPA window and not a candidate either way due to positive stool guaiac and severe anemia -Echocardiogram: Reviewed and showed EF of 55 to 60%. "The left ventricle has normal function. The left ventricle has  no regional wall motion abnormalities. Left ventricular diastolic parameters are  consistent with Grade I diastolicdysfunction (impaired relaxation). Right ventricular systolic function is normal. The right ventricular size is normal."  -Cardiac monitoring to evaluate for arrhythmias-negative for any dysrhythmias -MRI/MRA -consistent with aneurysms-right PICA right PCA ischemic changes Left and  right ICA aneurysm largest 10 mm -Bilateral carotid study, right 70% stenosis, left 60-65% stenosis -Chest x-ray 07/15/2020 reviewed-and showed mild CHF exacerbation, atherosclerotic changes -Appreciate neurology follow-up and recommendations regarding current finding of aneurysm Neurology Recommended  "anticoagulation with aspirin, follow-up as an outpatient with neurosurgery regarding the largest 10 mm ICA aneurysm -for potential coiling in the future versus outpatient serial imaging in 3 to 6 months Given her age and comorbidities likely has had aneurysm for long time"  -Gastroenterology recommended and referred patient being started on aspirin to neurology and because she has no sign of any GI bleeding currently we will start her on aspirin 81 mg p.o. daily  Acute Respiratory Failure with Hypoxia in the setting of acute COPD versus concomitant Acute on Chronic Diastolic CHF COPD (chronic obstructive pulmonary disease) ([facility])-mild exacerbation Acute on Chronic Diastolic CHF -Improved and is not in respiratory distress this a.m. -SpO2: 94 % O2 Flow Rate (L/min): 2 L/min continues to be on supplemental oxygen -Continue supplemental oxygen, nebs as needed; she was having Combivent and Xopenex scheduled but we have stopped the Combivent and started Xopenex and Atrovent scheduled every 6 -Mild COPD exacerbation versus volume overload in the setting of diastolic CHF exacerbation -Discontinue IV fluids -Lasix 40 mg x  On  9/20, 9/21, 9/22; Will give an additional IV Lasix 40 mg x1 this a.m. and will also give an additional dose this p.m. -We will monitor I's and O's, daily weights; patient is + 266.3 mL since admission and weight is up 14 pounds from admission -We will also fluid restrict the patient to 1500 mL -BNP yesterday morning was 339.1 -We have consulted cardiology for further evaluation recommendations and cardiology started the patient on spironolactone 25 mg daily -Chest x-ray this  morning showed "Cardiomegaly with pulmonary vascular congestion. Interstitial edemaand pleural effusions. Overall appearance felt to be indicative of a degree of congestive heart failure. Left lower lobe atelectatic changes stable. Aortic Atherosclerosis ." -Continue to monitor and repeat chest x-ray in the a.m.  Acute onsuspectchronic Blood loss anemia/ Severe Symptomatic anemia -Status post 2 units of PRBC.   -On admission hemoglobin 4.7 ---2U PRBC transfused >> 8.1>>8.5  and has trended down but remained relatively stable in the low 7 range -She was Guaiac positive,--previously hemoglobin was >10.0 at baseline -Hemodynamically stable currently -GI consulted and they believe the patient might help chronic iron deficiency, possible multiple chronic microbleed -They are not recommending upper lower endoscopy at this time and want patient to be stable from a CVA perspective -Her hemoglobin is stable and slightly trended down but there is no signs of any GI bleeding noted.  She did have a dark stool but this could be from the iron supplementation Dr. Verl Blalock feels this is anemia from chronic blood loss and he feels that there is nothing to do from a GI perspective until she is improved from her CVA which may be few weeks.  He recommends that the patient may be treated with iron and with transfusions as needed -We have changed her ferrous sulfate 325 mg p.o. twice daily to iron disaccharides 150 mg p.o. daily -She may need infusion of IV iron but will hold off for now  given her volume overload -Iron panel done and showed an iron level of 14, U IBC 391, TIBC of 405, saturation ratios of 4% -Repeat blood work the day before yesterday showed a hemoglobin/hematocrit of 8.9/31.8 yesterday morning her hemoglobin/hematocrit was 7.6/28.0 and today it is 7.4/26.1 Continue to monitor for signs and symptoms of bleeding; currently no overt bleeding noted  Acute Metabolic Encephalopathy -Resolved back to baseline  alert oriented x3 -Likely due to anemia, versus stroke -We will continue treating underlying causing anemia and CVA -Continue neurochecks per protocol -Continue with delirium precautions at night  Coronary artery disease -Stable denies any chest pain -Initially held aspirin but have now resumed -Reviewed home medication, resuming Imdur, beta-blockers, -Continue with simvastatin 40 mg p.o. daily that she has had a CVA -Cardiology has been consulted for further evaluation recommendations  Constipation -Was having bowel issues with her p.o. iron as well as her p.o. pain medications with hydrocodone-acetaminophen -Have stopped the docusate 100 g p.o. twice daily scheduled and have changed her to senna docusate 1 tab p.o. twice daily as well as MiraLAX 17 g daily  Hyperlipidemia -Patient's total cholesterol/HDL ratio was 2.1, cholesterol level was 117, HDL 55, LDL is 47, triglycerides was 74, VLDL 15 -Continue simvastatin 40 g p.o. daily  Benign Hypertension -Blood pressures were running low due to severe anemia -Currently Withholding Amlodipine 10 mg po Daily and Lisinopril 10 mg po Daily -Cardiology also added spironolactone 25 mg p.o. daily -C/w Metoprolol Succinate 75 mg po Daily and with Isosorbide Dinitrate 30 mg po BID -Continue to monitor blood pressures per protocol last blood pressure is 132/65 this afternoon  Elevated BUN -Slightly worsening and has a mild association from her creatinine -BUN has gone from 22 -> 27 -> 32 -We will be in the setting of her diuretics but will need to closely monitor for an upper GI bleed -She did have a dark stool today but she has been on iron supplementation and has not had any further bowel movements  Obesity -Estimated body mass index is 35.58 kg/m as calculated from the following:   Height as of this encounter: 4\' 11"  (1.499 m).   Weight as of this encounter: 79.9 kg.  -Weight Loss and Dietary Counseling given   DVT prophylaxis:  SCDs Code Status: DO NOT RESUSCITATE  Family Communication: Spoke with Son [name] at bedside Disposition Plan: SNF pending stabilization in Hgb/Hct and her heart failure and anticipating discharge when back to dry weight and off supplemental oxygen via nasal cannula  Status is: Inpatient  Remains inpatient appropriate because:Hemodynamically unstable and Inpatient level of care appropriate due to severity of illness   Dispo: The patient is from: Home              Anticipated d/c is to: SNF              Anticipated d/c date is: 2 days              Patient currently is not medically stable to d/c.  Consultants:   Neurology Dr. Irish Elders  Gastroenterology Dr. Allen Norris  Cardiology Dr. Humphrey Rolls  Procedures: None  Antimicrobials:  Anti-infectives (From admission, onward)   None        Subjective: Seen and examined at bedside and she is sitting in the chair at bedside and still felt a little puffy.  Denies any nausea or vomiting but did have a bowel movement and states it was a little black.  No abdominal pain or cramping.  Still  remains on supplemental oxygen via nasal cannula.  No other concerns or complaints at this time.  Objective: Vitals:   07/18/20 2003 07/19/20 0427 07/19/20 0823 07/19/20 1356  BP:   132/65   Pulse:   85   Resp:   18   Temp:   98.1 F (36.7 C)   TempSrc:   Oral   SpO2: 96%  97% 94%  Weight:  79.9 kg    Height:        Intake/Output Summary (Last 24 hours) at 07/19/2020 1706 Last data filed at 07/19/2020 0900 Gross per 24 hour  Intake 240 ml  Output 500 ml  Net -260 ml   Filed Weights   07/12/20 2243 07/18/20 0500 07/19/20 0427  Weight: 73.5 kg 80.1 kg 79.9 kg   Examination: Physical Exam:  Constitutional: WN/WD obese Caucasian elderly female currently in NAD and appears calm and appears a little dyspneic still Eyes: Lids and conjunctivae normal, sclerae anicteric  ENMT: External Ears, Nose appear normal. Grossly normal hearing.  Neck: Appears  normal, supple, no cervical masses, normal ROM, no appreciable thyromegaly; mild JVD Respiratory: Diminished to auscultation bilaterally with coarse breath sounds and bilateral crackles still but no wheezing, rales, rhonchi or crackles. No accessory muscle use.  Cardiovascular: RRR, no murmurs / rubs / gallops. S1 and S2 auscultated. Has 1+ LE Pitting Edema Abdomen: Soft, non-tender, non-distended. Bowel sounds positive.  GU: Deferred. Musculoskeletal: No clubbing / cyanosis of digits/nails. No joint deformity upper and lower extremities.  Skin: No rashes, lesions, ulcers but has some mild bruising. No induration; Warm and dry.  Neurologic: CN 2-12 grossly intact with no focal deficits. Romberg sign and cerebellar reflexes not assessed.  Psychiatric: Normal judgment and insight. Alert and oriented x 3. Normal mood and appropriate affect.   Data Reviewed: I have personally reviewed following labs and imaging studies  CBC: Recent Labs  Lab 07/16/20 0415 07/17/20 0506 07/17/20 1450 07/18/20 0443 07/19/20 0350  WBC 7.7 7.2 8.9 7.1 6.3  NEUTROABS  --   --  6.1 4.8 3.8  HGB 7.8* 7.3* 8.9* 7.6* 7.4*  HCT 28.1* 27.5* 31.8* 28.0* 26.1*  MCV 71.9* 75.1* 72.8* 74.3* 72.9*  PLT 156 147* 160 148* 481   Basic Metabolic Panel: Recent Labs  Lab 07/15/20 0648 07/16/20 0415 07/17/20 0506 07/18/20 0443 07/19/20 0350  NA 138 141 137 139 139  K 4.2 4.0 4.2 3.7 3.9  CL 108 106 101 101 99  CO2 23 28 29 30 31   GLUCOSE 98 114* 93 113* 110*  BUN 12 20 22  27* 32*  CREATININE 0.55 0.62 0.75 0.63 0.65  CALCIUM 8.3* 8.6* 8.6* 8.7* 8.4*  MG  --   --   --  2.3 2.4  PHOS  --   --   --  4.0 4.3   GFR: Estimated Creatinine Clearance: 46.1 mL/min (by C-G formula based on SCr of 0.65 mg/dL). Liver Function Tests: Recent Labs  Lab 07/12/20 2242 07/18/20 0443 07/19/20 0350  AST 14* 12* 12*  ALT 12 11 11   ALKPHOS 55 43 39  BILITOT 0.8 0.6 0.7  PROT 6.1* 5.0* 4.9*  ALBUMIN 3.6 2.7* 2.6*   No  results for input(s): LIPASE, AMYLASE in the last 168 hours. Recent Labs  Lab 07/12/20 2242  AMMONIA 14   Coagulation Profile: No results for input(s): INR, PROTIME in the last 168 hours. Cardiac Enzymes: No results for input(s): CKTOTAL, CKMB, CKMBINDEX, TROPONINI in the last 168 hours. BNP (last 3 results) No  results for input(s): PROBNP in the last 8760 hours. HbA1C: No results for input(s): HGBA1C in the last 72 hours. CBG: Recent Labs  Lab 07/17/20 1133 07/17/20 1646  GLUCAP 139* 120*   Lipid Profile: No results for input(s): CHOL, HDL, LDLCALC, TRIG, CHOLHDL, LDLDIRECT in the last 72 hours. Thyroid Function Tests: No results for input(s): TSH, T4TOTAL, FREET4, T3FREE, THYROIDAB in the last 72 hours. Anemia Panel: No results for input(s): VITAMINB12, FOLATE, FERRITIN, TIBC, IRON, RETICCTPCT in the last 72 hours. Sepsis Labs: No results for input(s): PROCALCITON, LATICACIDVEN in the last 168 hours.  Recent Results (from the past 240 hour(s))  SARS Coronavirus 2 by RT PCR (hospital order, performed in [facility] hospital lab) Nasopharyngeal Nasopharyngeal Swab     Status: None   Collection Time: 07/13/20 12:09 AM   Specimen: Nasopharyngeal Swab  Result Value Ref Range Status   SARS Coronavirus 2 NEGATIVE NEGATIVE Final    Comment: (NOTE) SARS-CoV-2 target nucleic acids are NOT DETECTED.  The SARS-CoV-2 RNA is generally detectable in upper and lower respiratory specimens during the acute phase of infection. The lowest concentration of SARS-CoV-2 viral copies this assay can detect is 250 copies / mL. A negative result does not preclude SARS-CoV-2 infection and should not be used as the sole basis for treatment or other patient management decisions.  A negative result may occur with improper specimen collection / handling, submission of specimen other than nasopharyngeal swab, presence of viral mutation(s) within the areas targeted by this assay, and inadequate number  of viral copies (<250 copies / mL). A negative result must be combined with clinical observations, patient history, and epidemiological information.  Fact Sheet for Patients:   StrictlyIdeas.no  Fact Sheet for Healthcare Providers: BankingDealers.co.za  This test is not yet approved or  cleared by the [place] FDA and has been authorized for detection and/or diagnosis of SARS-CoV-2 by FDA under an Emergency Use Authorization (EUA).  This EUA will remain in effect (meaning this test can be used) for the duration of the COVID-19 declaration under Section 564(b)(1) of the Act, 21 U.S.C. section 360bbb-3(b)(1), unless the authorization is terminated or revoked sooner.  Performed at [facility], [address] [place], [place] [address]      RN Pressure Injury Documentation:     Estimated body mass index is 35.58 kg/m as calculated from the following:   Height as of this encounter: 4\' 11"  (1.499 m).   Weight as of this encounter: 79.9 kg.  Malnutrition Type:  Nutrition Problem: Inadequate oral intake Etiology: decreased appetite   Malnutrition Characteristics:  Signs/Symptoms: per patient/family report   Nutrition Interventions:  Interventions: Ensure Enlive (each supplement provides 350kcal and 20 grams of protein)     Radiology Studies: DG Chest 1 View  Result Date: 07/19/2020 CLINICAL DATA:  Shortness of breath EXAM: CHEST  1 VIEW COMPARISON:  July 18, 2020 FINDINGS: There is a stable appearing left pleural effusion. Small right pleural effusion appears smaller compared to 1 day prior. There is a degree of interstitial pulmonary edema, stable. There is atelectatic change in the left base. There is cardiomegaly with pulmonary venous hypertension. No adenopathy. There is aortic atherosclerosis. No bone lesions. IMPRESSION: Cardiomegaly with pulmonary vascular congestion. Interstitial edema and pleural  effusions. Overall appearance felt to be indicative of a degree of congestive heart failure. Left lower lobe atelectatic changes stable. Aortic Atherosclerosis (ICD10-I70.0). Electronically Signed   By: Lowella Grip III M.D.   On: 07/19/2020 07:55   DG  Chest 1 View  Result Date: 07/18/2020 CLINICAL DATA:  Dyspnea EXAM: CHEST  1 VIEW COMPARISON:  07/15/2020 FINDINGS: Small to moderate bilateral pleural effusions are again identified, left greater than right, with associated bibasilar atelectasis. Superimposed interstitial pulmonary edema persists, likely cardiogenic in nature, slightly improved since prior examination. No pneumothorax. Mild to moderate cardiomegaly is unchanged. IMPRESSION: Slight interval improvement in cardiogenic failure. Persistent small to moderate bilateral pleural effusions. Electronically Signed   By: Fidela Salisbury MD   On: 07/18/2020 04:22   Scheduled Meds: .  stroke: mapping our early stages of recovery book   Does not apply Once  . artificial tears   Both Eyes Q8H  . aspirin EC  81 mg Oral Daily  . feeding supplement (ENSURE ENLIVE)  237 mL Oral BID BM  . ipratropium  0.5 mg Nebulization TID  . iron polysaccharides  150 mg Oral Daily  . isosorbide dinitrate  30 mg Oral BID  . levalbuterol  0.63 mg Nebulization TID  . metoprolol succinate  75 mg Oral Daily  . pantoprazole  40 mg Oral Daily  . polyethylene glycol  17 g Oral Daily  . senna-docusate  1 tablet Oral BID  . simvastatin  40 mg Oral Daily  . spironolactone  25 mg Oral Daily  . vitamin B-12  1,000 mcg Oral Daily   Continuous Infusions:   LOS: 6 days   Kerney Elbe, DO [facility] PAGER is on AMION  If 7PM-7AM, please contact night-coverage [email or web address]

## 2020-07-20 ENCOUNTER — Inpatient Hospital Stay: Payer: Medicare Other

## 2020-07-20 DIAGNOSIS — D509 Iron deficiency anemia, unspecified: Secondary | ICD-10-CM

## 2020-07-20 DIAGNOSIS — K921 Melena: Secondary | ICD-10-CM

## 2020-07-20 LAB — COMPREHENSIVE METABOLIC PANEL
ALT: 11 U/L (ref 0–44)
AST: 15 U/L (ref 15–41)
Albumin: 2.8 g/dL — ABNORMAL LOW (ref 3.5–5.0)
Alkaline Phosphatase: 43 U/L (ref 38–126)
Anion gap: 7 (ref 5–15)
BUN: 27 mg/dL — ABNORMAL HIGH (ref 8–23)
CO2: 34 mmol/L — ABNORMAL HIGH (ref 22–32)
Calcium: 8.7 mg/dL — ABNORMAL LOW (ref 8.9–10.3)
Chloride: 98 mmol/L (ref 98–111)
Creatinine, Ser: 0.64 mg/dL (ref 0.44–1.00)
GFR calc Af Amer: >60 mL/min (ref >60)
GFR calc non Af Amer: >60 mL/min (ref >60)
Glucose, Bld: 100 mg/dL — ABNORMAL HIGH (ref 70–99)
Potassium: 3.8 mmol/L (ref 3.5–5.1)
Sodium: 139 mmol/L (ref 135–145)
Total Bilirubin: 0.9 mg/dL (ref 0.3–1.2)
Total Protein: 5.1 g/dL — ABNORMAL LOW (ref 6.5–8.1)

## 2020-07-20 LAB — CBC WITH DIFFERENTIAL/PLATELET
Abs Immature Granulocytes: 0.02 10*3/uL (ref 0.00–0.07)
Basophils Absolute: 0.1 10*3/uL (ref 0.0–0.1)
Basophils Relative: 1 %
Eosinophils Absolute: 0.2 10*3/uL (ref 0.0–0.5)
Eosinophils Relative: 3 %
HCT: 27.3 % — ABNORMAL LOW (ref 36.0–46.0)
Hemoglobin: 7.6 g/dL — ABNORMAL LOW (ref 12.0–15.0)
Immature Granulocytes: 0 %
Lymphocytes Relative: 23 %
Lymphs Abs: 1.3 10*3/uL (ref 0.7–4.0)
MCH: 20.6 pg — ABNORMAL LOW (ref 26.0–34.0)
MCHC: 27.8 g/dL — ABNORMAL LOW (ref 30.0–36.0)
MCV: 74 fL — ABNORMAL LOW (ref 80.0–100.0)
Monocytes Absolute: 0.8 10*3/uL (ref 0.1–1.0)
Monocytes Relative: 14 %
Neutro Abs: 3.4 10*3/uL (ref 1.7–7.7)
Neutrophils Relative %: 59 %
Platelets: 168 10*3/uL (ref 150–400)
RBC: 3.69 MIL/uL — ABNORMAL LOW (ref 3.87–5.11)
RDW: 27.9 % — ABNORMAL HIGH (ref 11.5–15.5)
WBC: 5.7 10*3/uL (ref 4.0–10.5)
nRBC: 0 % (ref 0.0–0.2)

## 2020-07-20 LAB — PHOSPHORUS: Phosphorus: 3.5 mg/dL (ref 2.5–4.6)

## 2020-07-20 LAB — FOLATE: Folate: 7.6 ng/mL (ref >5.9)

## 2020-07-20 LAB — MAGNESIUM: Magnesium: 2.3 mg/dL (ref 1.7–2.4)

## 2020-07-20 MED ORDER — SODIUM CHLORIDE 0.9 % IV SOLN
510.0000 mg | Freq: Once | INTRAVENOUS | Status: AC
  Administered 2020-07-20: 510 mg via INTRAVENOUS
  Filled 2020-07-20: qty 17

## 2020-07-20 MED ORDER — FUROSEMIDE 10 MG/ML IJ SOLN
40.0000 mg | Freq: Once | INTRAMUSCULAR | Status: AC
  Administered 2020-07-20: 40 mg via INTRAVENOUS
  Filled 2020-07-20: qty 4

## 2020-07-20 MED ORDER — PANTOPRAZOLE SODIUM 40 MG IV SOLR
40.0000 mg | Freq: Two times a day (BID) | INTRAVENOUS | Status: DC
  Administered 2020-07-20 – 2020-07-24 (×8): 40 mg via INTRAVENOUS
  Filled 2020-07-20 (×8): qty 40

## 2020-07-20 NOTE — Progress Notes (Signed)
Physical Therapy Treatment Patient Details Name: Ashley [name] MRN: [id number] DOB: [birth year] Today's Date: 07/20/2020    History of Present Illness Per MD note:Sharian E Jerome is a 86 y.o. female with medical history significant for CAD, COPD, HTN who presents to the emergency room with altered mental status x2 days.  Most of the history is taken from the son who states that he noticed that his mother was lethargic and not her usual self and appeared to not recognize him and his symptoms worsened into today prompting him to bring her into the emergency room.  He states that she had been complaining of weakness for the past 2 weeks which she attributed to her age.  She saw her PCP 3 weeks prior and had blood work done and also recently saw her cardiologist.  He also states that she has been having hip pain and has been seeing the orthopedist and taking Tylenol but no NSAIDs for her pain.  She has had no cough, fever chills or shortness of breath.  No reports of nausea vomiting abdominal pain or change in bowel habits and no dysuria    PT Comments    Pt was long sitting in bed upon arriving with son present. She agrees to session and is cooperative and pleasant throughout. Lengthy discussion with pt/pt's son. Would prefer to DC home if deemed safe enough to do so. Pt's son to discuss with brother POC going forward. Currently recommending SNF at DC however pt is progressing well. Would be safe to DC home from PT standpoint if assistance available intermittently at home. Did require min assist to exit bed however after sitting EOB only required CGA/supervision for rest of functional mobility. She has large black BM during session. RN aware and MD  Aware. Pt was able to ambulate 300 ft on 2 L o2 without difficulty. Does require 2 standing rest breaks during gait training. Overall pt is progressing well and will benefit from continued skilled PT at DC to address deficits and return to PLOF.    Follow Up  Recommendations  SNF;Home health PT;Supervision - Intermittent     Equipment Recommendations  None recommended by PT    Recommendations for Other Services       Precautions / Restrictions Precautions Precautions: Fall Restrictions Weight Bearing Restrictions: No    Mobility  Bed Mobility Overal bed mobility: Needs Assistance Bed Mobility: Supine to Sit     Supine to sit: HOB elevated;Min assist     General bed mobility comments: min assist to exit bed with HOB elevated. Pt was able to exit R side of bed. requested to have BM  Transfers Overall transfer level: Needs assistance Equipment used: Rolling walker (2 wheeled) Transfers: Sit to/from Stand Sit to Stand: Min guard;Supervision         General transfer comment: Pt was able to STS with CGA at first however on 2-4 attempts was able to perform with supervision only. vcs for handplacement and increased fwd wt shift.  Ambulation/Gait Ambulation/Gait assistance: Supervision Gait Distance (Feet): 300 Feet Assistive device: Rolling walker (2 wheeled) Gait Pattern/deviations: Step-through pattern Gait velocity: decreased   General Gait Details: Pt was able to ambulate 300 ft without LOB or difficulty. Pt did require several standing rest however steady throughout.       Balance Overall balance assessment: Needs assistance Sitting-balance support: Feet supported Sitting balance-Leahy Scale: Good Sitting balance - Comments: no LOB seated EOB or on commode   Standing balance support: Bilateral upper extremity  supported Standing balance-Leahy Scale: Good Standing balance comment: requires UE support through RW, able to maintain well with static and fxl mobility         Cognition Arousal/Alertness: Awake/alert Behavior During Therapy: WFL for tasks assessed/performed Overall Cognitive Status: History of cognitive impairments - at baseline        General Comments: Pt was A and O x 3.Very coopwerative and  pleasant throughout.             Pertinent Vitals/Pain Pain Assessment: No/denies pain           PT Goals (current goals can now be found in the care plan section) Acute Rehab PT Goals Patient Stated Goal: to walk Progress towards PT goals: Progressing toward goals    Frequency    7X/week      PT Plan Current plan remains appropriate       AM-PAC PT "6 Clicks" Mobility   Outcome Measure  Help needed turning from your back to your side while in a flat bed without using bedrails?: A Little Help needed moving from lying on your back to sitting on the side of a flat bed without using bedrails?: A Little Help needed moving to and from a bed to a chair (including a wheelchair)?: A Little Help needed standing up from a chair using your arms (e.g., wheelchair or bedside chair)?: A Little Help needed to walk in hospital room?: A Little Help needed climbing 3-5 steps with a railing? : A Little 6 Click Score: 18    End of Session Equipment Utilized During Treatment: Gait belt;Oxygen (2 L o2 throughout) Activity Tolerance: Patient tolerated treatment well Patient left: in chair;with call bell/phone within reach;with chair alarm set;with family/visitor present Nurse Communication: Mobility status PT Visit Diagnosis: Muscle weakness (generalized) (M62.81);Difficulty in walking, not elsewhere classified (R26.2)     Time: 5784-6962 PT Time Calculation (min) (ACUTE ONLY): 43 min  Charges:                        Julaine Fusi PTA 07/20/20, 1:10 PM

## 2020-07-20 NOTE — Progress Notes (Signed)
PROGRESS NOTE    Ashley [name]  JAS:[id number] DOB: 07-Dec-1930 DOA: 07/12/2020 PCP: Albina Billet, MD   Brief Narrative:  HPI per Dr. Judd Gaudier on 07/13/20 HPI: Ashley [name] is a 86 y.o. female with medical history significant for CAD, COPD, HTN who presents to the emergency room with altered mental status x2 days.  Most of the history is taken from the son who states that he noticed that his mother was lethargic and not her usual self and appeared to not recognize him and his symptoms worsened into today prompting him to bring her into the emergency room.  He states that she had been complaining of weakness for the past 2 weeks which she attributed to her age.  She saw her PCP 3 weeks prior and had blood work done and also recently saw her cardiologist.  He also states that she has been having hip pain and has been seeing the orthopedist and taking Tylenol but no NSAIDs for her pain.  She has had no cough, fever chills or shortness of breath.  No reports of nausea vomiting abdominal pain or change in bowel habits and no dysuria ED Course: On arrival in the emergency room she was mildly tachypneic with otherwise normal vitals.  Blood work notable for hemoglobin of 4.7.  Last hemoglobin on file 10.7 from a year prior.  Stool guaiac positive.  Covid negative chest x-ray with no acute findings.  Head CT showed right occipital lobe hypodensity consistent with acute or subacute infarct.  Blood transfusion started in the ER.  Hospitalist consulted for admission.  Review of Systems: As per HPI otherwise all other systems on review of systems negative.  Unreliable due to mild lethargy  **Interim History She was evaluated by neurology and gastroenterology.  Gastroenterology recommending no further work-up at this time and recommending following up in outpatient once she is stable from a CVA perspective.  Neurology recommending aspirin 81 mg and also outpatient follow-up with neurosurgery given her  aneurysms.  Her respiratory status is improving and she has had no overt GI bleeding.  She was given another dose of IV Lasix today and is improving still remains volume overloaded so we have consulted Cardiology for further evaluation.  Repeat CBC today showed Hgb of 7.4.  Continues to be volume overloaded so we will continue IV diuresis with IV IV Lasix 40 twice daily and spironolactone for 5 mg p.o. daily per cardiology recommendations.  We have recommended for restriction to 1500 mL per if she continues to not lose weight and if she continues to not as strict as noted we may cut that back down to 1200.  Currently remains on supplemental oxygen via nasal cannula  07/20/20 She worked with therapy and ambulated well.  Continues to remain on supplemental oxygen.  She is down 4 pounds from yesterday and is diuresing.  We will give her some more IV Lasix this morning and this evening.  She had a large black stool and GI was reconsulted and they recommending stopping her iron supplementation given the patient IV iron remains relatively stable.  We will check a vitamin B12 level as well as a folate level and supplement her with IV iron.  GI does not feel that there is any clear evidence of a GI bleed and feels that the dark stools could be from the iron itself.  After an acute concern for GI bleeding evidenced by melena then they are recommending considering a tagged RBC scan.  We  have increased her PPI to IV twice daily now.  Assessment & Plan:   Principal Problem:   Acute CVA (cerebrovascular accident) [facility]) Active Problems:   Coronary artery disease   Benign hypertension   Acute on chronic blood loss anemia   Severe Symptomatic anemia   Acute metabolic encephalopathy   COPD (chronic obstructive pulmonary disease) (HCC)   Stroke (cerebrum) (HCC)  Acute/subacuteCVA (cerebrovascular accident) ([place]), in the setting of severe anemia Intracranial aneurysms/largest 10 mm Bilateral carotid  stenosis -Seems to be back to baseline On admission:patient presents with confusion with right occipital lobe infarct on CT -Symptom onset 48 hours prior to arrival, outside TPA window and not a candidate either way due to positive stool guaiac and severe anemia -Echocardiogram: Reviewed and showed EF of 55 to 60%. "The left ventricle has normal function. The left ventricle has no regional wall motion abnormalities. Left ventricular diastolic parameters are  consistent with Grade I diastolicdysfunction (impaired relaxation). Right ventricular systolic function is normal. The right ventricular size is normal."  -Cardiac monitoring to evaluate for arrhythmias-negative for any dysrhythmias -MRI/MRA -consistent with aneurysms-right PICA right PCA ischemic changes Left and right ICA aneurysm largest 10 mm -Bilateral carotid study, right 70% stenosis, left 60-65% stenosis -Chest x-ray 07/15/2020 reviewed-and showed mild CHF exacerbation, atherosclerotic changes -Appreciate neurology follow-up and recommendations regarding current finding of aneurysm Neurology Recommended  "anticoagulation with aspirin, follow-up as an outpatient with neurosurgery regarding the largest 10 mm ICA aneurysm -for potential coiling in the future versus outpatient serial imaging in 3 to 6 months Given her age and comorbidities likely has had aneurysm for long time"  -Gastroenterology recommended and referred patient being started on aspirin to neurology and because she has no sign of any GI bleeding currently we will start her on aspirin 81 mg p.o. daily and continue this for now  Acute Respiratory Failure with Hypoxia in the setting of acute COPD versus concomitant Acute on Chronic Diastolic CHF COPD (chronic obstructive pulmonary disease) (HCC)-mild exacerbation Acute on Chronic Diastolic CHF -Improved and is not in respiratory distress this a.m. -SpO2: 95 % O2 Flow Rate (L/min): 2 L/min continues to be on supplemental  oxygen -Continue supplemental oxygen, nebs as needed; she was having Combivent and Xopenex scheduled but we have stopped the Combivent and started Xopenex and Atrovent scheduled every 6 -Mild COPD exacerbation versus volume overload in the setting of diastolic CHF exacerbation -Discontinue IV fluids -Lasix 40 mg x  On  9/20, 9/21, 9/22, 9/23, 9/24; Will give an additional IV Lasix 40 mg x1 this a.m. and will also give an additional dose this p.m. -Patient was started on spironolactone 25 mg p.o. daily by cardiology -We will monitor I's and O's, daily weights; patient is -936.8 mL since admission and weight is up 12 pounds from admission (down 4 from yesterday) -I/O last 3 completed shifts: In: 240 [P.O.:240] Out: 1003 [Urine:1002; Stool:1] Total I/O In: -  Out: 700 [Urine:700] -We will also fluid restrict the patient to 1500 mL -BNP the day before yesterday morning was 339.1 -We have consulted cardiology for further evaluation recommendations and cardiology started the patient on spironolactone 25 mg daily -Chest x-ray this morning showed "Enlarged cardiac silhouette. Mild interstitial pulmonary edema. Bilateral small pleural effusions. Left lower lobe atelectasis versus airspace consolidation. Large hiatal hernia." -Continue to monitor and repeat chest x-ray in the a.m.  Acute onsuspectchronic Blood loss anemia/ Severe Symptomatic anemia -Status post 2 units of PRBC.   -On admission hemoglobin 4.7 ---2U PRBC transfused >>  8.1>>8.5  and has trended down but remained relatively stable in the low 7 range -She was Guaiac positive,--previously hemoglobin was >10.0 at baseline -Hemodynamically stable currently -GI consulted and they believe the patient might help chronic iron deficiency, possible multiple chronic microbleed -They are not recommending upper lower endoscopy at this time and want patient to be stable from a CVA perspective -Her hemoglobin is stable and slightly trended down but  there is no signs of any GI bleeding noted.  -She did have a dark stool yesterday but this could be from the iron supplementation and she had another large dark stool today so we reconsult GI -GI feels this is anemia from chronic blood loss and he feels that there is nothing to do from a GI perspective until she is improved from her CVA which may be few weeks.  We have stopped her p.o. iron supplementation and have given her a dose of Feraheme -We have changed her ferrous sulfate 325 mg p.o. twice daily to iron disaccharides 150 mg p.o. daily and now stopped it altogether -She may need infusion of IV iron but will hold off for now given her volume overload -Iron panel done and showed an iron level of 14, U IBC 391, TIBC of 405, saturation ratios of 4% -Repeat blood count today shows relatively stable hemoglobin/hematocrit now mid 7 ranges as it is 7.6/27.3 continue to monitor for signs and symptoms of bleeding; currently no overt bleeding noted  Acute Metabolic Encephalopathy -Resolved back to baseline alert oriented x3 -Likely due to anemia, versus stroke -We will continue treating underlying causing anemia and CVA -Continue neurochecks per protocol -Continue with delirium precautions at night  Coronary artery disease -Stable denies any chest pain -Initially held aspirin but have now resumed getting it 81 mg p.o. daily -Reviewed home medication, resuming Imdur, beta-blockers, -Continue with simvastatin 40 mg p.o. daily that she has had a CVA -Cardiology has been consulted for further evaluation recommendations  Constipation -Was having bowel issues with her p.o. iron as well as her p.o. pain medications with hydrocodone-acetaminophen -Have stopped the docusate 100 g p.o. twice daily scheduled and have changed her to senna docusate 1 tab p.o. twice daily as well as MiraLAX 17 g daily  Hyperlipidemia -Patient's total cholesterol/HDL ratio was 2.1, cholesterol level was 117, HDL 55, LDL is  47, triglycerides was 74, VLDL 15 -Continue simvastatin 40 g p.o. daily  Benign Hypertension -Blood pressures were running low due to severe anemia -Currently Withholding Amlodipine 10 mg po Daily and Lisinopril 10 mg po Daily -Cardiology also added spironolactone 25 mg p.o. daily -C/w Metoprolol Succinate 75 mg po Daily and with Isosorbide Dinitrate 30 mg po BID -Continue to monitor blood pressures per protocol last blood pressure is 132/65 this afternoon  Elevated BUN -Slightly worsening and has a mild association from her creatinine -BUN has gone from 22 -> 27 -> 32 and today is 34 -We will be in the setting of her diuretics but will need to closely monitor for an upper GI bleed -She did have a dark stool today but she has been on iron supplementation and has not had any further bowel movements yesterday but today she had a very large dark stool and we reconsulted GI who does not feel that she is having active GI bleed with melena given stability of her hemoglobin and they recommend continue to monitor and stopping the oral iron  Obesity -Estimated body mass index is 34.78 kg/m as calculated from the following:  Height as of this encounter: 4\' 11"  (1.499 m).   Weight as of this encounter: 78.1 kg.  -Weight Loss and Dietary Counseling given   DVT prophylaxis: SCDs Code Status: DO NOT RESUSCITATE  Family Communication: Spoke with Son [name] at bedside Disposition Plan: SNF pending stabilization in Hgb/Hct and her heart failure and anticipating discharge when back to dry weight and off supplemental oxygen via nasal cannula  Status is: Inpatient  Remains inpatient appropriate because:Hemodynamically unstable and Inpatient level of care appropriate due to severity of illness   Dispo: The patient is from: Home              Anticipated d/c is to: SNF              Anticipated d/c date is: 2 days              Patient currently is not medically stable to d/c.  Consultants:    Neurology Dr. Irish Elders  Gastroenterology Dr. Allen Norris  Cardiology Dr. Humphrey Rolls  Procedures: None  Antimicrobials:  Anti-infectives (From admission, onward)   None        Subjective: Seen and examined at bedside and she was walking with therapy and had a very large dark bowel movement right prior to my arrival.  She denies any chest pain but still feels a little short of breath.  Thinks that she is less puffy today.  No nausea or vomiting.  Denies any other concerns or complaints at this time.  Objective: Vitals:   07/20/20 0500 07/20/20 0744 07/20/20 0849 07/20/20 1531  BP:   (!) 152/73   Pulse:   94   Resp:   18   Temp:   98.2 F (36.8 C)   TempSrc:   Oral   SpO2:  95% 96% 95%  Weight: 78.1 kg     Height:        Intake/Output Summary (Last 24 hours) at 07/20/2020 1708 Last data filed at 07/20/2020 1504 Gross per 24 hour  Intake --  Output 1203 ml  Net -1203 ml   Filed Weights   07/18/20 0500 07/19/20 0427 07/20/20 0500  Weight: 80.1 kg 79.9 kg 78.1 kg   Examination: Physical Exam:  Constitutional: WN/WD obese Caucasian female currently no acute distress but does appear a little dyspneic after she just ambulated with physical therapy. Eyes: Lids and conjunctivae normal, sclerae anicteric  ENMT: External Ears, Nose appear normal. Grossly normal hearing.  Neck: Appears normal, supple, no cervical masses, normal ROM, no appreciable thyromegaly; no JVD Respiratory: Diminished to auscultation bilaterally with coarse breath sounds and some crackles noted bilaterally and mild rhonchi.  No appreciable wheezing, no rales.  Slightly increased respiratory rate but is not using any accessory muscles to breathe.  She is on supplemental oxygen via nasal cannula. Cardiovascular: RRR, no murmurs / rubs / gallops. S1 and S2 auscultated. No extremity edema. 2+ pedal pulses. No carotid bruits.  Abdomen: Soft, non-tender, distended secondary body habitus. Bowel sounds positive.  GU:  Deferred. Musculoskeletal: No clubbing / cyanosis of digits/nails. No joint deformity upper and lower extremities.  Skin: No rashes, lesions, ulcers on limited skin evaluation. No induration; Warm and dry.  Neurologic: CN 2-12 grossly intact with no focal deficits. Romberg sign cerebellar reflexes not assessed.  Psychiatric: Normal judgment and insight. Alert and oriented x 3. Normal mood and appropriate affect.   Data Reviewed: I have personally reviewed following labs and imaging studies  CBC: Recent Labs  Lab 07/17/20 0506 07/17/20 1450 07/18/20  8502 07/19/20 0350 07/20/20 0720  WBC 7.2 8.9 7.1 6.3 5.7  NEUTROABS  --  6.1 4.8 3.8 3.4  HGB 7.3* 8.9* 7.6* 7.4* 7.6*  HCT 27.5* 31.8* 28.0* 26.1* 27.3*  MCV 75.1* 72.8* 74.3* 72.9* 74.0*  PLT 147* 160 148* 150 774   Basic Metabolic Panel: Recent Labs  Lab 07/16/20 0415 07/17/20 0506 07/18/20 0443 07/19/20 0350 07/20/20 0720  NA 141 137 139 139 139  K 4.0 4.2 3.7 3.9 3.8  CL 106 101 101 99 98  CO2 28 29 30 31  34*  GLUCOSE 114* 93 113* 110* 100*  BUN 20 22 27* 32* 27*  CREATININE 0.62 0.75 0.63 0.65 0.64  CALCIUM 8.6* 8.6* 8.7* 8.4* 8.7*  MG  --   --  2.3 2.4 2.3  PHOS  --   --  4.0 4.3 3.5   GFR: Estimated Creatinine Clearance: 45.6 mL/min (by C-G formula based on SCr of 0.64 mg/dL). Liver Function Tests: Recent Labs  Lab 07/18/20 0443 07/19/20 0350 07/20/20 0720  AST 12* 12* 15  ALT 11 11 11   ALKPHOS 43 39 43  BILITOT 0.6 0.7 0.9  PROT 5.0* 4.9* 5.1*  ALBUMIN 2.7* 2.6* 2.8*   No results for input(s): LIPASE, AMYLASE in the last 168 hours. No results for input(s): AMMONIA in the last 168 hours. Coagulation Profile: No results for input(s): INR, PROTIME in the last 168 hours. Cardiac Enzymes: No results for input(s): CKTOTAL, CKMB, CKMBINDEX, TROPONINI in the last 168 hours. BNP (last 3 results) No results for input(s): PROBNP in the last 8760 hours. HbA1C: No results for input(s): HGBA1C in the last 72  hours. CBG: Recent Labs  Lab 07/17/20 1133 07/17/20 1646  GLUCAP 139* 120*   Lipid Profile: No results for input(s): CHOL, HDL, LDLCALC, TRIG, CHOLHDL, LDLDIRECT in the last 72 hours. Thyroid Function Tests: No results for input(s): TSH, T4TOTAL, FREET4, T3FREE, THYROIDAB in the last 72 hours. Anemia Panel: No results for input(s): VITAMINB12, FOLATE, FERRITIN, TIBC, IRON, RETICCTPCT in the last 72 hours. Sepsis Labs: No results for input(s): PROCALCITON, LATICACIDVEN in the last 168 hours.  Recent Results (from the past 240 hour(s))  SARS Coronavirus 2 by RT PCR (hospital order, performed in [facility] hospital lab) Nasopharyngeal Nasopharyngeal Swab     Status: None   Collection Time: 07/13/20 12:09 AM   Specimen: Nasopharyngeal Swab  Result Value Ref Range Status   SARS Coronavirus 2 NEGATIVE NEGATIVE Final    Comment: (NOTE) SARS-CoV-2 target nucleic acids are NOT DETECTED.  The SARS-CoV-2 RNA is generally detectable in upper and lower respiratory specimens during the acute phase of infection. The lowest concentration of SARS-CoV-2 viral copies this assay can detect is 250 copies / mL. A negative result does not preclude SARS-CoV-2 infection and should not be used as the sole basis for treatment or other patient management decisions.  A negative result may occur with improper specimen collection / handling, submission of specimen other than nasopharyngeal swab, presence of viral mutation(s) within the areas targeted by this assay, and inadequate number of viral copies (<250 copies / mL). A negative result must be combined with clinical observations, patient history, and epidemiological information.  Fact Sheet for Patients:   StrictlyIdeas.no  Fact Sheet for Healthcare Providers: BankingDealers.co.za  This test is not yet approved or  cleared by the [place] FDA and has been authorized for detection and/or diagnosis  of SARS-CoV-2 by FDA under an Emergency Use Authorization (EUA).  This EUA will remain in  effect (meaning this test can be used) for the duration of the COVID-19 declaration under Section 564(b)(1) of the Act, 21 U.S.C. section 360bbb-3(b)(1), unless the authorization is terminated or revoked sooner.  Performed at [facility], [address] [place], [place] [address]      RN Pressure Injury Documentation:     Estimated body mass index is 34.78 kg/m as calculated from the following:   Height as of this encounter: 4\' 11"  (1.499 m).   Weight as of this encounter: 78.1 kg.  Malnutrition Type:  Nutrition Problem: Inadequate oral intake Etiology: decreased appetite   Malnutrition Characteristics:  Signs/Symptoms: per patient/family report   Nutrition Interventions:  Interventions: Ensure Enlive (each supplement provides 350kcal and 20 grams of protein)     Radiology Studies: DG Chest 1 View  Result Date: 07/20/2020 CLINICAL DATA:  Shortness of breath EXAM: CHEST  1 VIEW COMPARISON:  July 19, 2020 FINDINGS: Enlarged cardiac silhouette. Calcific atherosclerotic disease of the aorta. Large hiatal hernia. Bilateral small pleural effusions. Left lower lobe atelectasis versus airspace consolidation. Mild interstitial pulmonary edema. Osseous structures are without acute abnormality. Soft tissues are grossly normal. IMPRESSION: 1. Enlarged cardiac silhouette. 2. Mild interstitial pulmonary edema. 3. Bilateral small pleural effusions. 4. Left lower lobe atelectasis versus airspace consolidation. 5. Large hiatal hernia. Electronically Signed   By: Fidela Salisbury M.D.   On: 07/20/2020 12:42   DG Chest 1 View  Result Date: 07/19/2020 CLINICAL DATA:  Shortness of breath EXAM: CHEST  1 VIEW COMPARISON:  07/19/2020 FINDINGS: There is cardiomegaly. There are small to moderate-sized bilateral pleural effusions. There is vascular congestion without overt pulmonary edema.  There are atherosclerotic changes of the thoracic aorta. There is no pneumothorax. No acute osseous abnormality. IMPRESSION: Congestive heart failure, similar to prior study. Electronically Signed   By: Constance Holster M.D.   On: 07/19/2020 19:49   DG Chest 1 View  Result Date: 07/19/2020 CLINICAL DATA:  Shortness of breath EXAM: CHEST  1 VIEW COMPARISON:  July 18, 2020 FINDINGS: There is a stable appearing left pleural effusion. Small right pleural effusion appears smaller compared to 1 day prior. There is a degree of interstitial pulmonary edema, stable. There is atelectatic change in the left base. There is cardiomegaly with pulmonary venous hypertension. No adenopathy. There is aortic atherosclerosis. No bone lesions. IMPRESSION: Cardiomegaly with pulmonary vascular congestion. Interstitial edema and pleural effusions. Overall appearance felt to be indicative of a degree of congestive heart failure. Left lower lobe atelectatic changes stable. Aortic Atherosclerosis (ICD10-I70.0). Electronically Signed   By: Lowella Grip III M.D.   On: 07/19/2020 07:55   Scheduled Meds: .  stroke: mapping our early stages of recovery book   Does not apply Once  . artificial tears   Both Eyes Q8H  . aspirin EC  81 mg Oral Daily  . feeding supplement (ENSURE ENLIVE)  237 mL Oral BID BM  . ipratropium  0.5 mg Nebulization TID  . isosorbide dinitrate  30 mg Oral BID  . levalbuterol  0.63 mg Nebulization TID  . metoprolol succinate  75 mg Oral Daily  . pantoprazole (PROTONIX) IV  40 mg Intravenous Q12H  . polyethylene glycol  17 g Oral Daily  . senna-docusate  1 tablet Oral BID  . simvastatin  40 mg Oral Daily  . spironolactone  25 mg Oral Daily  . vitamin B-12  1,000 mcg Oral Daily   Continuous Infusions: . ferumoxytol      LOS: 7 days  Kerney Elbe, DO [facility] PAGER is on AMION  If 7PM-7AM, please contact night-coverage [email or web address]

## 2020-07-20 NOTE — Progress Notes (Signed)
Ashley [name] , MD [address], [place], [place], [place], [address] [address], [place], [place], [place], [address] Phone: [phone]  Fax: [phone]   Ashley [name] is being followed for GI bleed in the setting of an acute CVA  Subjective:   Doing well , no hematemesis, had a formed dark/black bowel movement earlier today - not runny.    Objective: Vital signs in last 24 hours: Vitals:   07/19/20 2334 07/20/20 0500 07/20/20 0744 07/20/20 0849  BP: (!) 142/61   (!) 152/73  Pulse: 86   94  Resp: 19   18  Temp: 98 F (36.7 C)   98.2 F (36.8 C)  TempSrc: Oral   Oral  SpO2: 94%  95% 96%  Weight:  78.1 kg    Height:       Weight change: -1.8 kg  Intake/Output Summary (Last 24 hours) at 07/20/2020 1110 Last data filed at 07/20/2020 0340 Gross per 24 hour  Intake --  Output 503 ml  Net -503 ml     Exam: Ax0 x 3 on oxygen sitting up    Lab Results: @LABTEST2 @ Micro Results: Recent Results (from the past 240 hour(s))  SARS Coronavirus 2 by RT PCR (hospital order, performed in [facility] hospital lab) Nasopharyngeal Nasopharyngeal Swab     Status: None   Collection Time: 07/13/20 12:09 AM   Specimen: Nasopharyngeal Swab  Result Value Ref Range Status   SARS Coronavirus 2 NEGATIVE NEGATIVE Final    Comment: (NOTE) SARS-CoV-2 target nucleic acids are NOT DETECTED.  The SARS-CoV-2 RNA is generally detectable in upper and lower respiratory specimens during the acute phase of infection. The lowest concentration of SARS-CoV-2 viral copies this assay can detect is 250 copies / mL. A negative result does not preclude SARS-CoV-2 infection and should not be used as the sole basis for treatment or other patient management decisions.  A negative result may occur with improper specimen collection / handling, submission of specimen other than nasopharyngeal swab, presence of viral mutation(s) within the areas targeted by this assay, and inadequate number of  viral copies (<250 copies / mL). A negative result must be combined with clinical observations, patient history, and epidemiological information.  Fact Sheet for Patients:   StrictlyIdeas.no  Fact Sheet for Healthcare Providers: BankingDealers.co.za  This test is not yet approved or  cleared by the [place] FDA and has been authorized for detection and/or diagnosis of SARS-CoV-2 by FDA under an Emergency Use Authorization (EUA).  This EUA will remain in effect (meaning this test can be used) for the duration of the COVID-19 declaration under Section 564(b)(1) of the Act, 21 U.S.C. section 360bbb-3(b)(1), unless the authorization is terminated or revoked sooner.  Performed at [facility], [address] [place], [place] [address]    Studies/Results: [place] Chest 1 View  Result Date: 07/19/2020 CLINICAL DATA:  Shortness of breath EXAM: CHEST  1 VIEW COMPARISON:  07/19/2020 FINDINGS: There is cardiomegaly. There are small to moderate-sized bilateral pleural effusions. There is vascular congestion without overt pulmonary edema. There are atherosclerotic changes of the thoracic aorta. There is no pneumothorax. No acute osseous abnormality. IMPRESSION: Congestive heart failure, similar to prior study. Electronically Signed   By: Constance Holster M.D.   On: 07/19/2020 19:49   DG Chest 1 View  Result Date: 07/19/2020 CLINICAL DATA:  Shortness of breath EXAM: CHEST  1 VIEW COMPARISON:  July 18, 2020 FINDINGS: There is a stable appearing left pleural effusion.  Small right pleural effusion appears smaller compared to 1 day prior. There is a degree of interstitial pulmonary edema, stable. There is atelectatic change in the left base. There is cardiomegaly with pulmonary venous hypertension. No adenopathy. There is aortic atherosclerosis. No bone lesions. IMPRESSION: Cardiomegaly with pulmonary vascular congestion. Interstitial edema  and pleural effusions. Overall appearance felt to be indicative of a degree of congestive heart failure. Left lower lobe atelectatic changes stable. Aortic Atherosclerosis (ICD10-I70.0). Electronically Signed   By: Lowella Grip III M.D.   On: 07/19/2020 07:55   Medications: I have reviewed the patient's current medications. Scheduled Meds: .  stroke: mapping our early stages of recovery book   Does not apply Once  . artificial tears   Both Eyes Q8H  . aspirin EC  81 mg Oral Daily  . feeding supplement (ENSURE ENLIVE)  237 mL Oral BID BM  . furosemide  40 mg Intravenous Once  . ipratropium  0.5 mg Nebulization TID  . iron polysaccharides  150 mg Oral Daily  . isosorbide dinitrate  30 mg Oral BID  . levalbuterol  0.63 mg Nebulization TID  . metoprolol succinate  75 mg Oral Daily  . pantoprazole (PROTONIX) IV  40 mg Intravenous Q12H  . polyethylene glycol  17 g Oral Daily  . senna-docusate  1 tablet Oral BID  . simvastatin  40 mg Oral Daily  . spironolactone  25 mg Oral Daily  . vitamin B-12  1,000 mcg Oral Daily   Continuous Infusions: PRN Meds:.acetaminophen **OR** acetaminophen (TYLENOL) oral liquid 160 mg/5 mL **OR** acetaminophen, ALPRAZolam, gadobutrol, HYDROcodone-acetaminophen, sodium chloride flush   Assessment: Principal Problem:   Acute CVA (cerebrovascular accident) ([facility]) Active Problems:   Coronary artery disease   Benign hypertension   Acute on chronic blood loss anemia   Severe Symptomatic anemia   Acute metabolic encephalopathy   COPD (chronic obstructive pulmonary disease) ([facility])   Stroke (cerebrum) ([facility])  Ashley [name] 86 y.o. female was admitted on 07/13/2020 with an acute CVA,acute respiratory failure with hypoxia in the setting of acute COPD on oxygen at present .: Right occipital lobe infarct on CT.  On admission hemoglobin was 4.7 g and last hemoglobin of prior year was 10.7 g.  The MCV was microcytic with a MCV of 69.3 and an iron of 14.  No ferritin was  checked.  No B12 or folate has been checked. GI was consulted and since the patient had an acute CVA endoscopy was deferred due to the high risk of anesthesia we had signed out a few days back.    I have been asked to see the patient today since there has been black dark stool noted.  He has been on oral iron.  Hb  has not dropped today at 7.6 g which was 7.4 g yesterday BUN of 32 yesterday today it is 27.  Plan: 1.  Testing of stool for blood is a test for colon cancer screening and inappropriate in an inpatient setting.  Presence of blood on stool test does not rule in or rule out a GI bleed.Oral iron can cause a false positive test.    The patient has microcytic anemia on admission which is usually chronic and not acute.  Plan from yesterday suggest patient may need IV iron.  I would strongly suggest to give the patient IV iron as his bone marrow cannot produce new blood cells unless has adequate iron.  Oral iron is very poorly absorbed and less than 30% of what  is given orally gets into the system.  In view of his severe microcytic anemia strongly suggest   IV iron.  Unclear of his B12 and folate status.  She  would also require his B12 levels to be checked which is also required to produce new RBCs and if low would need replacement.  2.  In terms of her GI bleed there is no clear evidence of a GI bleed.  Hb has been stable.  No rise in the BUN/creatinine ratio.  Dark stools could be from oral iron.  Suggest to stop the oral iron and give him IV iron and watch stool color/Hb .  If there is concern for active bleeding evidenced by melena then consider tagged RBC scan.  Endoscopy would only be indicated when we are concerned of an active significant bleed where the risks of anesthesia/endoscopy outweigh its benefits in the setting of an acute CVA as endoscopy/anesthesia is to be avoided in the setting of an acute CVA for at least 4 to 6 weeks if not longer.Continue PPI    LOS: 7 days   Ashley Bellows,  MD 07/20/2020, 11:10 AM

## 2020-07-20 NOTE — Progress Notes (Signed)
SUBJECTIVE: Less short of breath   Vitals:   07/19/20 2334 07/20/20 0500 07/20/20 0744 07/20/20 0849  BP: (!) 142/61   (!) 152/73  Pulse: 86   94  Resp: 19   18  Temp: 98 F (36.7 C)   98.2 F (36.8 C)  TempSrc: Oral   Oral  SpO2: 94%  95% 96%  Weight:  78.1 kg    Height:        Intake/Output Summary (Last 24 hours) at 07/20/2020 1441 Last data filed at 07/20/2020 0340 Gross per 24 hour  Intake --  Output 503 ml  Net -503 ml    LABS: Basic Metabolic Panel: Recent Labs    07/19/20 0350 07/20/20 0720  NA 139 139  K 3.9 3.8  CL 99 98  CO2 31 34*  GLUCOSE 110* 100*  BUN 32* 27*  CREATININE 0.65 0.64  CALCIUM 8.4* 8.7*  MG 2.4 2.3  PHOS 4.3 3.5   Liver Function Tests: Recent Labs    07/19/20 0350 07/20/20 0720  AST 12* 15  ALT 11 11  ALKPHOS 39 43  BILITOT 0.7 0.9  PROT 4.9* 5.1*  ALBUMIN 2.6* 2.8*   No results for input(s): LIPASE, AMYLASE in the last 72 hours. CBC: Recent Labs    07/19/20 0350 07/20/20 0720  WBC 6.3 5.7  NEUTROABS 3.8 3.4  HGB 7.4* 7.6*  HCT 26.1* 27.3*  MCV 72.9* 74.0*  PLT 150 168   Cardiac Enzymes: No results for input(s): CKTOTAL, CKMB, CKMBINDEX, TROPONINI in the last 72 hours. BNP: Invalid input(s): POCBNP D-Dimer: No results for input(s): DDIMER in the last 72 hours. Hemoglobin A1C: No results for input(s): HGBA1C in the last 72 hours. Fasting Lipid Panel: No results for input(s): CHOL, HDL, LDLCALC, TRIG, CHOLHDL, LDLDIRECT in the last 72 hours. Thyroid Function Tests: No results for input(s): TSH, T4TOTAL, T3FREE, THYROIDAB in the last 72 hours.  Invalid input(s): FREET3 Anemia Panel: No results for input(s): VITAMINB12, FOLATE, FERRITIN, TIBC, IRON, RETICCTPCT in the last 72 hours.   PHYSICAL EXAM General: Well developed, well nourished, in no acute distress HEENT:  Normocephalic and atramatic Neck:  No JVD.  Lungs: Clear bilaterally to auscultation and percussion. Heart: HRRR . Normal S1 and S2 without  gallops or murmurs.  Abdomen: Bowel sounds are positive, abdomen soft and non-tender  Msk:  Back normal, normal gait. Normal strength and tone for age. Extremities: No clubbing, cyanosis or edema.   Neuro: Alert and oriented X 3. Psych:  Good affect, responds appropriately  TELEMETRY: Sinus rhythm  ASSESSMENT AND PLAN: Congestive heart failure with preserved ejection fraction advise continuing Lasix and Aldactone.  Principal Problem:   Acute CVA (cerebrovascular accident) [facility]) Active Problems:   Coronary artery disease   Benign hypertension   Acute on chronic blood loss anemia   Severe Symptomatic anemia   Acute metabolic encephalopathy   COPD (chronic obstructive pulmonary disease) ([place])   Stroke (cerebrum) ([facility])    [name] A, MD, [facility] 07/20/2020 2:41 PM

## 2020-07-21 ENCOUNTER — Inpatient Hospital Stay: Payer: Medicare Other

## 2020-07-21 LAB — MAGNESIUM: Magnesium: 2.6 mg/dL — ABNORMAL HIGH (ref 1.7–2.4)

## 2020-07-21 LAB — COMPREHENSIVE METABOLIC PANEL
ALT: 12 U/L (ref 0–44)
AST: 14 U/L — ABNORMAL LOW (ref 15–41)
Albumin: 2.8 g/dL — ABNORMAL LOW (ref 3.5–5.0)
Alkaline Phosphatase: 37 U/L — ABNORMAL LOW (ref 38–126)
Anion gap: 8 (ref 5–15)
BUN: 27 mg/dL — ABNORMAL HIGH (ref 8–23)
CO2: 32 mmol/L (ref 22–32)
Calcium: 8.8 mg/dL — ABNORMAL LOW (ref 8.9–10.3)
Chloride: 99 mmol/L (ref 98–111)
Creatinine, Ser: 0.67 mg/dL (ref 0.44–1.00)
GFR calc Af Amer: >60 mL/min (ref >60)
GFR calc non Af Amer: >60 mL/min (ref >60)
Glucose, Bld: 98 mg/dL (ref 70–99)
Potassium: 3.7 mmol/L (ref 3.5–5.1)
Sodium: 139 mmol/L (ref 135–145)
Total Bilirubin: 1.1 mg/dL (ref 0.3–1.2)
Total Protein: 5 g/dL — ABNORMAL LOW (ref 6.5–8.1)

## 2020-07-21 LAB — PHOSPHORUS: Phosphorus: 3.2 mg/dL (ref 2.5–4.6)

## 2020-07-21 LAB — CBC WITH DIFFERENTIAL/PLATELET
Abs Immature Granulocytes: 0.02 10*3/uL (ref 0.00–0.07)
Basophils Absolute: 0.1 10*3/uL (ref 0.0–0.1)
Basophils Relative: 1 %
Eosinophils Absolute: 0.2 10*3/uL (ref 0.0–0.5)
Eosinophils Relative: 4 %
HCT: 27.9 % — ABNORMAL LOW (ref 36.0–46.0)
Hemoglobin: 7.5 g/dL — ABNORMAL LOW (ref 12.0–15.0)
Immature Granulocytes: 0 %
Lymphocytes Relative: 25 %
Lymphs Abs: 1.4 10*3/uL (ref 0.7–4.0)
MCH: 20.8 pg — ABNORMAL LOW (ref 26.0–34.0)
MCHC: 26.9 g/dL — ABNORMAL LOW (ref 30.0–36.0)
MCV: 77.5 fL — ABNORMAL LOW (ref 80.0–100.0)
Monocytes Absolute: 0.8 10*3/uL (ref 0.1–1.0)
Monocytes Relative: 15 %
Neutro Abs: 3.2 10*3/uL (ref 1.7–7.7)
Neutrophils Relative %: 55 %
Platelets: 185 10*3/uL (ref 150–400)
RBC: 3.6 MIL/uL — ABNORMAL LOW (ref 3.87–5.11)
RDW: 27.9 % — ABNORMAL HIGH (ref 11.5–15.5)
Smear Review: NORMAL
WBC: 5.6 10*3/uL (ref 4.0–10.5)
nRBC: 0 % (ref 0.0–0.2)

## 2020-07-21 LAB — VITAMIN B12: Vitamin B-12: 801 pg/mL (ref 180–914)

## 2020-07-21 MED ORDER — FUROSEMIDE 10 MG/ML IJ SOLN
60.0000 mg | Freq: Once | INTRAMUSCULAR | Status: AC
  Administered 2020-07-21: 60 mg via INTRAVENOUS
  Filled 2020-07-21: qty 8

## 2020-07-21 NOTE — Progress Notes (Signed)
Physical Therapy Treatment Patient Details Name: Ashley [name] MRN: [id number] DOB: Nov 01, 1930 Today's Date: 07/21/2020    History of Present Illness Per MD note:Ashley [name] is a 86 y.o. female with medical history significant for CAD, COPD, HTN who presents to the emergency room with altered mental status x2 days.  Most of the history is taken from the son who states that he noticed that his mother was lethargic and not her usual self and appeared to not recognize him and his symptoms worsened into today prompting him to bring her into the emergency room.  He states that she had been complaining of weakness for the past 2 weeks which she attributed to her age.  She saw her PCP 3 weeks prior and had blood work done and also recently saw her cardiologist.  He also states that she has been having hip pain and has been seeing the orthopedist and taking Tylenol but no NSAIDs for her pain.  She has had no cough, fever chills or shortness of breath.  No reports of nausea vomiting abdominal pain or change in bowel habits and no dysuria    PT Comments    Pt on commode upon arrival.  Assisted with care.  Stood with min a x 1 and is able to complete 2 laps around unit with RW and min guard.  HR and O2 stable on 2 lpm.  Remained in recliner after session.  Pt progressing well with mobility skills.  Discussed with son who stated they plan to take pt home with assist.  Stated he thinks they have all equipment needed.  Will look for bedside commode and alert SWS if needed.    Follow Up Recommendations  Home health PT;Supervision for mobility/OOB     Equipment Recommendations  None recommended by PT    Recommendations for Other Services       Precautions / Restrictions Precautions Precautions: Fall Restrictions Weight Bearing Restrictions: No    Mobility  Bed Mobility Overal bed mobility: Needs Assistance Bed Mobility: Supine to Sit     Supine to sit: Min assist;HOB elevated         Transfers Overall transfer level: Needs assistance Equipment used: Rolling walker (2 wheeled) Transfers: Sit to/from Stand Sit to Stand: Min guard;Supervision            Ambulation/Gait Ambulation/Gait assistance: Supervision;Min guard Gait Distance (Feet): 360 Feet Assistive device: Rolling walker (2 wheeled) Gait Pattern/deviations: Step-through pattern Gait velocity: decreased   General Gait Details: 2 laps around unit with slow steady gait.   Stairs             Wheelchair Mobility    Modified Rankin (Stroke Patients Only)       Balance Overall balance assessment: Needs assistance Sitting-balance support: Feet supported Sitting balance-Leahy Scale: Good Sitting balance - Comments: no LOB seated EOB or on commode   Standing balance support: Bilateral upper extremity supported Standing balance-Leahy Scale: Good Standing balance comment: requires UE support through RW, able to maintain well with static and fxl mobility                            Cognition Arousal/Alertness: Awake/alert Behavior During Therapy: WFL for tasks assessed/performed Overall Cognitive Status: History of cognitive impairments - at baseline  General Comments: Pt was A and O x 3.Very coopwerative and pleasant throughout.      Exercises      General Comments        Pertinent Vitals/Pain Pain Assessment: No/denies pain    Home Living                      Prior Function            PT Goals (current goals can now be found in the care plan section) Progress towards PT goals: Progressing toward goals    Frequency    7X/week      PT Plan Discharge plan needs to be updated    Co-evaluation              AM-PAC PT "6 Clicks" Mobility   Outcome Measure  Help needed turning from your back to your side while in a flat bed without using bedrails?: A Little Help needed moving from lying on your  back to sitting on the side of a flat bed without using bedrails?: A Little Help needed moving to and from a bed to a chair (including a wheelchair)?: A Little Help needed standing up from a chair using your arms (e.g., wheelchair or bedside chair)?: A Little Help needed to walk in hospital room?: A Little Help needed climbing 3-5 steps with a railing? : A Little 6 Click Score: 18    End of Session Equipment Utilized During Treatment: Gait belt;Oxygen Activity Tolerance: Patient tolerated treatment well Patient left: in chair;with call bell/phone within reach;with chair alarm set;with family/visitor present Nurse Communication: Mobility status       Time: 3953-2023 PT Time Calculation (min) (ACUTE ONLY): 31 min  Charges:  $Gait Training: 8-22 mins $Therapeutic Activity: 8-22 mins                    Chesley Noon, PTA 07/21/20, 10:55 AM

## 2020-07-21 NOTE — Progress Notes (Signed)
PROGRESS NOTE    Ashley [name]  NKN:[id number] DOB: 1931/01/15 DOA: 07/12/2020 PCP: Albina Billet, MD   Brief Narrative:  HPI per Dr. Judd Gaudier on 07/13/20 HPI: Ashley [name] is a 86 y.o. female with medical history significant for CAD, COPD, HTN who presents to the emergency room with altered mental status x2 days.  Most of the history is taken from the son who states that he noticed that his mother was lethargic and not her usual self and appeared to not recognize him and his symptoms worsened into today prompting him to bring her into the emergency room.  He states that she had been complaining of weakness for the past 2 weeks which she attributed to her age.  She saw her PCP 3 weeks prior and had blood work done and also recently saw her cardiologist.  He also states that she has been having hip pain and has been seeing the orthopedist and taking Tylenol but no NSAIDs for her pain.  She has had no cough, fever chills or shortness of breath.  No reports of nausea vomiting abdominal pain or change in bowel habits and no dysuria ED Course: On arrival in the emergency room she was mildly tachypneic with otherwise normal vitals.  Blood work notable for hemoglobin of 4.7.  Last hemoglobin on file 10.7 from a year prior.  Stool guaiac positive.  Covid negative chest x-ray with no acute findings.  Head CT showed right occipital lobe hypodensity consistent with acute or subacute infarct.  Blood transfusion started in the ER.  Hospitalist consulted for admission.  Review of Systems: As per HPI otherwise all other systems on review of systems negative.  Unreliable due to mild lethargy  **Interim History She was evaluated by neurology and gastroenterology.  Gastroenterology recommending no further work-up at this time and recommending following up in outpatient once she is stable from a CVA perspective.  Neurology recommending aspirin 81 mg and also outpatient follow-up with neurosurgery given her  aneurysms.  Her respiratory status is improving and she has had no overt GI bleeding.  She was given another dose of IV Lasix today and is improving still remains volume overloaded so we have consulted Cardiology for further evaluation.  Repeat CBC today showed Hgb of 7.4.  Continues to be volume overloaded so we will continue IV diuresis with IV IV Lasix 40 twice daily and spironolactone for 5 mg p.o. daily per cardiology recommendations.  We have recommended for restriction to 1500 mL per if she continues to not lose weight and if she continues to not as strict as noted we may cut that back down to 1200.  Currently remains on supplemental oxygen via nasal cannula  07/20/20 She worked with therapy and ambulated well.  Continues to remain on supplemental oxygen.  She is down 4 pounds from yesterday and is diuresing.  We will give her some more IV Lasix this morning and this evening.  She had a large black stool and GI was reconsulted and they recommending stopping her iron supplementation given the patient IV iron remains relatively stable.  We will check a vitamin B12 level as well as a folate level and supplement her with IV iron.  GI does not feel that there is any clear evidence of a GI bleed and feels that the dark stools could be from the iron itself.  After an acute concern for GI bleeding evidenced by melena then they are recommending considering a tagged RBC scan.  We  have increased her PPI to IV twice daily now.  07/21/20 Patient continues to be volume overloaded so IV Diuresis was increased and will get IV 60 mg this AM and IV 60 mg this PM. Had a Black BM this AM. PT evaluated and now recommending Home Health.   Assessment & Plan:   Principal Problem:   Acute CVA (cerebrovascular accident) [facility]) Active Problems:   Coronary artery disease   Benign hypertension   Acute on chronic blood loss anemia   Severe Symptomatic anemia   Acute metabolic encephalopathy   COPD (chronic obstructive  pulmonary disease) (HCC)   Stroke (cerebrum) (HCC)  Acute/subacuteCVA (cerebrovascular accident) ([place]), in the setting of severe anemia Intracranial aneurysms/largest 10 mm Bilateral carotid stenosis -Seems to be back to baseline On admission:patient presents with confusion with right occipital lobe infarct on CT -Symptom onset 48 hours prior to arrival, outside TPA window and not a candidate either way due to positive stool guaiac and severe anemia -Echocardiogram: Reviewed and showed EF of 55 to 60%. "The left ventricle has normal function. The left ventricle has no regional wall motion abnormalities. Left ventricular diastolic parameters are  consistent with Grade I diastolicdysfunction (impaired relaxation). Right ventricular systolic function is normal. The right ventricular size is normal."  -Cardiac monitoring to evaluate for arrhythmias-negative for any dysrhythmias -MRI/MRA -consistent with aneurysms-right PICA right PCA ischemic changes Left and right ICA aneurysm largest 10 mm -Bilateral carotid study, right 70% stenosis, left 60-65% stenosis -Chest x-ray 07/15/2020 reviewed-and showed mild CHF exacerbation, atherosclerotic changes -Appreciate neurology follow-up and recommendations regarding current finding of aneurysm Neurology Recommended  "anticoagulation with aspirin, follow-up as an outpatient with neurosurgery regarding the largest 10 mm ICA aneurysm -for potential coiling in the future versus outpatient serial imaging in 3 to 6 months Given her age and comorbidities likely has had aneurysm for long time"  -Gastroenterology recommended and referred patient being started on aspirin to neurology and because she has no sign of any GI bleeding currently we will start her on aspirin 81 mg p.o. daily and continue this for now  Acute Respiratory Failure with Hypoxia in the setting of acute COPD versus concomitant Acute on Chronic Diastolic CHF, improving slightly  COPD (chronic  obstructive pulmonary disease) (HCC)-mild exacerbation Acute on Chronic Diastolic CHF -Improved and is not in respiratory distress this a.m. -SpO2: 100 % O2 Flow Rate (L/min): 2 L/min continues to be on supplemental oxygen -Continue supplemental oxygen, nebs as needed; she was having Combivent and Xopenex scheduled but we have stopped the Combivent and started Xopenex and Atrovent scheduled every 6 -Mild COPD exacerbation versus volume overload in the setting of diastolic CHF exacerbation -Discontinue IV fluids -Lasix 40 mg x  On  9/20, 9/21, 9/22, 9/23, 9/24, 9/25; Will give an additional IV Lasix 60 mg x1 this a.m. and will also give an additional dose IV 60 mg this p.m. -Patient was started on spironolactone 25 mg p.o. daily by cardiology -We will monitor I's and O's, daily weights; patient is -2,136.8 mL since admission and weight is up 8 lbs from admission weight now - Intake/Output Summary (Last 24 hours) at 07/21/2020 1725 Last data filed at 07/21/2020 0300 Gross per 24 hour  Intake --  Output 1200 ml  Net -1200 ml   -We will also fluid restrict the patient to 1500 mL -BNP earlier was 339.1 -We have consulted cardiology for further evaluation recommendations and cardiology started the patient on spironolactone 25 mg daily -Chest x-ray this morning showed "Enlarged  cardiac silhouette. Mild interstitial pulmonary edema. Bilateral small pleural effusions. Left lower lobe atelectasis versus airspace consolidation. Large hiatal hernia." -Continue to monitor and repeat chest x-ray in the a.m.  Acute onsuspectchronic Blood loss anemia/ Severe Symptomatic anemia -Status post 2 units of PRBC.   -On admission hemoglobin 4.7 ---2U PRBC transfused >> 8.1>>8.5  and has trended down but remained relatively stable in the low 7 range -She was Guaiac positive,--previously hemoglobin was >10.0 at baseline -Hemodynamically stable currently -GI consulted and they believe the patient might help  chronic iron deficiency, possible multiple chronic microbleed -They are not recommending upper lower endoscopy at this time and want patient to be stable from a CVA perspective -Her hemoglobin is stable and slightly trended down but there is no signs of any GI bleeding noted.  -She did have a dark stool yesterday but this could be from the iron supplementation and she had another large dark stool today so we reconsult GI -GI feels this is anemia from chronic blood loss and he feels that there is nothing to do from a GI perspective until she is improved from her CVA which may be few weeks.  We have stopped her p.o. iron supplementation and have given her a dose of Feraheme -We have changed her ferrous sulfate 325 mg p.o. twice daily to iron disaccharides 150 mg p.o. daily and now stopped it altogether -She may need infusion of IV iron but will hold off for now given her volume overload -Iron panel done and showed an iron level of 14, U IBC 391, TIBC of 405, saturation ratios of 4% -Repeat blood count today shows relatively stable hemoglobin/hematocrit now mid 7 ranges as it is 7.5/27.9 today -Continue to monitor for signs and symptoms of bleeding; currently no overt bleeding noted  Acute Metabolic Encephalopathy -Resolved back to baseline alert oriented x3 -Likely due to anemia, versus stroke -We will continue treating underlying causing anemia and CVA -Continue neurochecks per protocol -Continue with delirium precautions at night  Coronary artery disease -Stable denies any chest pain -Initially held aspirin but have now resumed getting it 81 mg p.o. daily -Reviewed home medication, resuming Imdur, beta-blockers, -Continue with simvastatin 40 mg p.o. daily that she has had a CVA -Cardiology has been consulted for further evaluation recommendations  Constipation -Was having bowel issues with her p.o. iron as well as her p.o. pain medications with hydrocodone-acetaminophen -Have stopped the  docusate 100 g p.o. twice daily scheduled and have changed her to senna docusate 1 tab p.o. twice daily as well as MiraLAX 17 g daily  Hyperlipidemia -Patient's total cholesterol/HDL ratio was 2.1, cholesterol level was 117, HDL 55, LDL is 47, triglycerides was 74, VLDL 15 -Continue simvastatin 40 g p.o. daily  Benign Hypertension -Blood pressures were running low due to severe anemia -Currently Withholding Amlodipine 10 mg po Daily and Lisinopril 10 mg po Daily -Cardiology also added spironolactone 25 mg p.o. daily -C/w Metoprolol Succinate 75 mg po Daily and with Isosorbide Dinitrate 30 mg po BID -Continue to monitor blood pressures per protocol last blood pressure is 160/73 this afternoon  Elevated BUN -Slightly worsening and has a mild association from her creatinine -BUN has gone from 22 -> 27 -> 32 -> 34 -> 27 -> 27 -We will be in the setting of her diuretics but will need to closely monitor for an upper GI bleed -She did have a dark stool today but she has been on iron supplementation and has not had any further bowel movements  yesterday but today she had a very large dark stool and we reconsulted GI who does not feel that she is having active GI bleed with melena given stability of her hemoglobin and they recommend continue to monitor and stopping the oral iron  Obesity -Estimated body mass index is 34.42 kg/m as calculated from the following:   Height as of this encounter: 4\' 11"  (1.499 m).   Weight as of this encounter: 77.3 kg.  -Weight Loss and Dietary Counseling given   DVT prophylaxis: SCDs Code Status: DO NOT RESUSCITATE  Family Communication: Spoke with Son [name] at bedside Disposition Plan: SNF pending stabilization in Hgb/Hct and her heart failure and anticipating discharge when back to dry weight and off supplemental oxygen via nasal cannula  Status is: Inpatient  Remains inpatient appropriate because:Hemodynamically unstable and Inpatient level of care  appropriate due to severity of illness   Dispo: The patient is from: Home              Anticipated d/c is to: SNF vs Home Health PT              Anticipated d/c date is:1-2 days              Patient currently is not medically stable to d/c.  Consultants:   Neurology Dr. Irish Elders  Gastroenterology Dr. Allen Norris  Cardiology Dr. Humphrey Rolls  Procedures: None  Antimicrobials:  Anti-infectives (From admission, onward)   None        Subjective: Seen and examined at bedside and she is sitting at the edge of the bed and just had a black stool.  No nausea or vomiting.  Thinks her breathing is doing a little better.  Ambulated fairly well with physical therapy.  Still has some swelling on her but states that she thinks is improving.  No other concerns or complaints at this time.  Objective: Vitals:   07/21/20 0907 07/21/20 0952 07/21/20 1533 07/21/20 1653  BP: (!) 154/73 (!) 155/83  (!) 160/73  Pulse: (!) 101 (!) 110  97  Resp: (!) 22 20  20   Temp: 98.2 F (36.8 C) 98.4 F (36.9 C)  98.6 F (37 C)  TempSrc: Oral Oral  Oral  SpO2: 93% 96% 95% 100%  Weight:      Height:        Intake/Output Summary (Last 24 hours) at 07/21/2020 1713 Last data filed at 07/21/2020 0300 Gross per 24 hour  Intake --  Output 1200 ml  Net -1200 ml   Filed Weights   07/19/20 0427 07/20/20 0500 07/21/20 0500  Weight: 79.9 kg 78.1 kg 77.3 kg   Examination: Physical Exam:  Constitutional: WN/WD obese Caucasian female currently in no acute distress sitting at the edge of the bed and not as dyspneic as she was yesterday. Eyes: Lids and conjunctivae normal, sclerae anicteric  ENMT: External Ears, Nose appear normal. Grossly normal hearing. Neck: Appears normal, supple, no cervical masses, normal ROM, no appreciable thyromegaly: No JVD Respiratory: Diminished to auscultation bilaterally with coarse breath sounds and some crackles noted bilaterally slightly worse on the left compared to the right.  Has very  minimal rhonchi wearing supplemental oxygen via nasal cannula and has a normal respiratory effort Cardiovascular: RRR, no murmurs / rubs / gallops. S1 and S2 auscultated.  Some 1+ lower extremity edema Abdomen: Soft, non-tender, distended secondary body habitus. Bowel sounds positive.  GU: Deferred. Musculoskeletal: No clubbing / cyanosis of digits/nails. No joint deformity upper and lower extremities.  Skin: No  rashes, lesions, ulcers on limited skin evaluation. No induration; Warm and dry.  Neurologic: CN 2-12 grossly intact with no focal deficits. Romberg sign and cerebellar reflexes not assessed.  Psychiatric: Normal judgment and insight. Alert and oriented x 3. Normal mood and appropriate affect.   Data Reviewed: I have personally reviewed following labs and imaging studies  CBC: Recent Labs  Lab 07/17/20 1450 07/18/20 0443 07/19/20 0350 07/20/20 0720 07/21/20 0430  WBC 8.9 7.1 6.3 5.7 5.6  NEUTROABS 6.1 4.8 3.8 3.4 3.2  HGB 8.9* 7.6* 7.4* 7.6* 7.5*  HCT 31.8* 28.0* 26.1* 27.3* 27.9*  MCV 72.8* 74.3* 72.9* 74.0* 77.5*  PLT 160 148* 150 168 811   Basic Metabolic Panel: Recent Labs  Lab 07/17/20 0506 07/18/20 0443 07/19/20 0350 07/20/20 0720 07/21/20 0430  NA 137 139 139 139 139  K 4.2 3.7 3.9 3.8 3.7  CL 101 101 99 98 99  CO2 29 30 31  34* 32  GLUCOSE 93 113* 110* 100* 98  BUN 22 27* 32* 27* 27*  CREATININE 0.75 0.63 0.65 0.64 0.67  CALCIUM 8.6* 8.7* 8.4* 8.7* 8.8*  MG  --  2.3 2.4 2.3 2.6*  PHOS  --  4.0 4.3 3.5 3.2   GFR: Estimated Creatinine Clearance: 45.3 mL/min (by C-G formula based on SCr of 0.67 mg/dL). Liver Function Tests: Recent Labs  Lab 07/18/20 0443 07/19/20 0350 07/20/20 0720 07/21/20 0430  AST 12* 12* 15 14*  ALT 11 11 11 12   ALKPHOS 43 39 43 37*  BILITOT 0.6 0.7 0.9 1.1  PROT 5.0* 4.9* 5.1* 5.0*  ALBUMIN 2.7* 2.6* 2.8* 2.8*   No results for input(s): LIPASE, AMYLASE in the last 168 hours. No results for input(s): AMMONIA in the last  168 hours. Coagulation Profile: No results for input(s): INR, PROTIME in the last 168 hours. Cardiac Enzymes: No results for input(s): CKTOTAL, CKMB, CKMBINDEX, TROPONINI in the last 168 hours. BNP (last 3 results) No results for input(s): PROBNP in the last 8760 hours. HbA1C: No results for input(s): HGBA1C in the last 72 hours. CBG: Recent Labs  Lab 07/17/20 1133 07/17/20 1646  GLUCAP 139* 120*   Lipid Profile: No results for input(s): CHOL, HDL, LDLCALC, TRIG, CHOLHDL, LDLDIRECT in the last 72 hours. Thyroid Function Tests: No results for input(s): TSH, T4TOTAL, FREET4, T3FREE, THYROIDAB in the last 72 hours. Anemia Panel: Recent Labs    07/20/20 0728 07/20/20 1824  VITAMINB12  --  801  FOLATE 7.6  --    Sepsis Labs: No results for input(s): PROCALCITON, LATICACIDVEN in the last 168 hours.  Recent Results (from the past 240 hour(s))  SARS Coronavirus 2 by RT PCR (hospital order, performed in [facility] hospital lab) Nasopharyngeal Nasopharyngeal Swab     Status: None   Collection Time: 07/13/20 12:09 AM   Specimen: Nasopharyngeal Swab  Result Value Ref Range Status   SARS Coronavirus 2 NEGATIVE NEGATIVE Final    Comment: (NOTE) SARS-CoV-2 target nucleic acids are NOT DETECTED.  The SARS-CoV-2 RNA is generally detectable in upper and lower respiratory specimens during the acute phase of infection. The lowest concentration of SARS-CoV-2 viral copies this assay can detect is 250 copies / mL. A negative result does not preclude SARS-CoV-2 infection and should not be used as the sole basis for treatment or other patient management decisions.  A negative result may occur with improper specimen collection / handling, submission of specimen other than nasopharyngeal swab, presence of viral mutation(s) within the areas targeted by this assay,  and inadequate number of viral copies (<250 copies / mL). A negative result must be combined with clinical observations, patient  history, and epidemiological information.  Fact Sheet for Patients:   StrictlyIdeas.no  Fact Sheet for Healthcare Providers: BankingDealers.co.za  This test is not yet approved or  cleared by the [place] FDA and has been authorized for detection and/or diagnosis of SARS-CoV-2 by FDA under an Emergency Use Authorization (EUA).  This EUA will remain in effect (meaning this test can be used) for the duration of the COVID-19 declaration under Section 564(b)(1) of the Act, 21 U.S.C. section 360bbb-3(b)(1), unless the authorization is terminated or revoked sooner.  Performed at [facility], [address] [place], [place] [address]      RN Pressure Injury Documentation:     Estimated body mass index is 34.42 kg/m as calculated from the following:   Height as of this encounter: 4\' 11"  (1.499 m).   Weight as of this encounter: 77.3 kg.  Malnutrition Type:  Nutrition Problem: Inadequate oral intake Etiology: decreased appetite   Malnutrition Characteristics:  Signs/Symptoms: per patient/family report   Nutrition Interventions:  Interventions: Ensure Enlive (each supplement provides 350kcal and 20 grams of protein)     Radiology Studies: DG Chest 1 View  Result Date: 07/21/2020 CLINICAL DATA:  Shortness of breath EXAM: CHEST  1 VIEW COMPARISON:  07/20/2020 FINDINGS: Cardiomegaly and large hiatal hernia again noted. Pulmonary vascular congestion and small bilateral pleural effusions, LEFT greater than RIGHT, and possible mild interstitial edema again noted. There is no evidence of pneumothorax. IMPRESSION: Little significant change with cardiomegaly, pulmonary vascular congestion, small bilateral pleural effusions and possible mild interstitial edema again noted. Electronically Signed   By: Margarette Canada M.D.   On: 07/21/2020 07:28   DG Chest 1 View  Result Date: 07/20/2020 CLINICAL DATA:  Shortness of breath  EXAM: CHEST  1 VIEW COMPARISON:  July 19, 2020 FINDINGS: Enlarged cardiac silhouette. Calcific atherosclerotic disease of the aorta. Large hiatal hernia. Bilateral small pleural effusions. Left lower lobe atelectasis versus airspace consolidation. Mild interstitial pulmonary edema. Osseous structures are without acute abnormality. Soft tissues are grossly normal. IMPRESSION: 1. Enlarged cardiac silhouette. 2. Mild interstitial pulmonary edema. 3. Bilateral small pleural effusions. 4. Left lower lobe atelectasis versus airspace consolidation. 5. Large hiatal hernia. Electronically Signed   By: Fidela Salisbury M.D.   On: 07/20/2020 12:42   DG Chest 1 View  Result Date: 07/19/2020 CLINICAL DATA:  Shortness of breath EXAM: CHEST  1 VIEW COMPARISON:  07/19/2020 FINDINGS: There is cardiomegaly. There are small to moderate-sized bilateral pleural effusions. There is vascular congestion without overt pulmonary edema. There are atherosclerotic changes of the thoracic aorta. There is no pneumothorax. No acute osseous abnormality. IMPRESSION: Congestive heart failure, similar to prior study. Electronically Signed   By: Constance Holster M.D.   On: 07/19/2020 19:49   Scheduled Meds: .  stroke: mapping our early stages of recovery book   Does not apply Once  . artificial tears   Both Eyes Q8H  . aspirin EC  81 mg Oral Daily  . feeding supplement (ENSURE ENLIVE)  237 mL Oral BID BM  . furosemide  60 mg Intravenous Once  . ipratropium  0.5 mg Nebulization TID  . isosorbide dinitrate  30 mg Oral BID  . levalbuterol  0.63 mg Nebulization TID  . metoprolol succinate  75 mg Oral Daily  . pantoprazole (PROTONIX) IV  40 mg Intravenous Q12H  . polyethylene glycol  17 g  Oral Daily  . senna-docusate  1 tablet Oral BID  . simvastatin  40 mg Oral Daily  . spironolactone  25 mg Oral Daily  . vitamin B-12  1,000 mcg Oral Daily   Continuous Infusions:   LOS: 8 days   Kerney Elbe, DO [facility] PAGER is on AMION  If 7PM-7AM, please contact night-coverage [email or web address]

## 2020-07-21 NOTE — Progress Notes (Signed)
SUBJECTIVE: Patient is no longer short of breath   Vitals:   07/21/20 0500 07/21/20 0712 07/21/20 0907 07/21/20 0952  BP:   (!) 154/73 (!) 155/83  Pulse:   (!) 101 (!) 110  Resp:   (!) 22 20  Temp:   98.2 F (36.8 C) 98.4 F (36.9 C)  TempSrc:   Oral Oral  SpO2:  93% 93% 96%  Weight: 77.3 kg     Height:        Intake/Output Summary (Last 24 hours) at 07/21/2020 1006 Last data filed at 07/21/2020 0300 Gross per 24 hour  Intake --  Output 1900 ml  Net -1900 ml    LABS: Basic Metabolic Panel: Recent Labs    07/20/20 0720 07/21/20 0430  NA 139 139  K 3.8 3.7  CL 98 99  CO2 34* 32  GLUCOSE 100* 98  BUN 27* 27*  CREATININE 0.64 0.67  CALCIUM 8.7* 8.8*  MG 2.3 2.6*  PHOS 3.5 3.2   Liver Function Tests: Recent Labs    07/20/20 0720 07/21/20 0430  AST 15 14*  ALT 11 12  ALKPHOS 43 37*  BILITOT 0.9 1.1  PROT 5.1* 5.0*  ALBUMIN 2.8* 2.8*   No results for input(s): LIPASE, AMYLASE in the last 72 hours. CBC: Recent Labs    07/20/20 0720 07/21/20 0430  WBC 5.7 5.6  NEUTROABS 3.4 3.2  HGB 7.6* 7.5*  HCT 27.3* 27.9*  MCV 74.0* 77.5*  PLT 168 185   Cardiac Enzymes: No results for input(s): CKTOTAL, CKMB, CKMBINDEX, TROPONINI in the last 72 hours. BNP: Invalid input(s): POCBNP D-Dimer: No results for input(s): DDIMER in the last 72 hours. Hemoglobin A1C: No results for input(s): HGBA1C in the last 72 hours. Fasting Lipid Panel: No results for input(s): CHOL, HDL, LDLCALC, TRIG, CHOLHDL, LDLDIRECT in the last 72 hours. Thyroid Function Tests: No results for input(s): TSH, T4TOTAL, T3FREE, THYROIDAB in the last 72 hours.  Invalid input(s): FREET3 Anemia Panel: Recent Labs    07/20/20 0728 07/20/20 1824  VITAMINB12  --  801  FOLATE 7.6  --      PHYSICAL EXAM General: Well developed, well nourished, in no acute distress HEENT:  Normocephalic and atramatic Neck:  No JVD.  Lungs: Clear bilaterally to auscultation and percussion. Heart: HRRR .  Normal S1 and S2 without gallops or murmurs.  Abdomen: Bowel sounds are positive, abdomen soft and non-tender  Msk:  Back normal, normal gait. Normal strength and tone for age. Extremities: No clubbing, cyanosis or edema.   Neuro: Alert and oriented X 3. Psych:  Good affect, responds appropriately  TELEMETRY: Sinus rhythm  ASSESSMENT AND PLAN: Congestive heart failure with recent CVA and coronary artery disease and severe anemia.  Patient is gradually getting better with diuretics.  Principal Problem:   Acute CVA (cerebrovascular accident) [facility]) Active Problems:   Coronary artery disease   Benign hypertension   Acute on chronic blood loss anemia   Severe Symptomatic anemia   Acute metabolic encephalopathy   COPD (chronic obstructive pulmonary disease) ([place])   Stroke (cerebrum) (HCC)    Stoy Fenn A, MD, [facility] 07/21/2020 10:06 AM

## 2020-07-22 ENCOUNTER — Inpatient Hospital Stay: Payer: Medicare Other

## 2020-07-22 DIAGNOSIS — E876 Hypokalemia: Secondary | ICD-10-CM

## 2020-07-22 LAB — CBC WITH DIFFERENTIAL/PLATELET
Abs Immature Granulocytes: 0.03 10*3/uL (ref 0.00–0.07)
Basophils Absolute: 0 10*3/uL (ref 0.0–0.1)
Basophils Relative: 1 %
Eosinophils Absolute: 0.2 10*3/uL (ref 0.0–0.5)
Eosinophils Relative: 2 %
HCT: 28.4 % — ABNORMAL LOW (ref 36.0–46.0)
Hemoglobin: 7.6 g/dL — ABNORMAL LOW (ref 12.0–15.0)
Immature Granulocytes: 0 %
Lymphocytes Relative: 14 %
Lymphs Abs: 1.2 10*3/uL (ref 0.7–4.0)
MCH: 20.5 pg — ABNORMAL LOW (ref 26.0–34.0)
MCHC: 26.8 g/dL — ABNORMAL LOW (ref 30.0–36.0)
MCV: 76.8 fL — ABNORMAL LOW (ref 80.0–100.0)
Monocytes Absolute: 0.8 10*3/uL (ref 0.1–1.0)
Monocytes Relative: 9 %
Neutro Abs: 6.3 10*3/uL (ref 1.7–7.7)
Neutrophils Relative %: 74 %
Platelets: 234 10*3/uL (ref 150–400)
RBC: 3.7 MIL/uL — ABNORMAL LOW (ref 3.87–5.11)
Smear Review: NORMAL
WBC: 8.5 10*3/uL (ref 4.0–10.5)
nRBC: 0 % (ref 0.0–0.2)

## 2020-07-22 LAB — COMPREHENSIVE METABOLIC PANEL
ALT: 12 U/L (ref 0–44)
AST: 15 U/L (ref 15–41)
Albumin: 2.9 g/dL — ABNORMAL LOW (ref 3.5–5.0)
Alkaline Phosphatase: 48 U/L (ref 38–126)
Anion gap: 10 (ref 5–15)
BUN: 25 mg/dL — ABNORMAL HIGH (ref 8–23)
CO2: 34 mmol/L — ABNORMAL HIGH (ref 22–32)
Calcium: 8.8 mg/dL — ABNORMAL LOW (ref 8.9–10.3)
Chloride: 96 mmol/L — ABNORMAL LOW (ref 98–111)
Creatinine, Ser: 0.66 mg/dL (ref 0.44–1.00)
GFR calc Af Amer: >60 mL/min (ref >60)
GFR calc non Af Amer: >60 mL/min (ref >60)
Glucose, Bld: 105 mg/dL — ABNORMAL HIGH (ref 70–99)
Potassium: 3.4 mmol/L — ABNORMAL LOW (ref 3.5–5.1)
Sodium: 140 mmol/L (ref 135–145)
Total Bilirubin: 0.9 mg/dL (ref 0.3–1.2)
Total Protein: 5.3 g/dL — ABNORMAL LOW (ref 6.5–8.1)

## 2020-07-22 LAB — PHOSPHORUS: Phosphorus: 3.1 mg/dL (ref 2.5–4.6)

## 2020-07-22 LAB — MAGNESIUM: Magnesium: 2.3 mg/dL (ref 1.7–2.4)

## 2020-07-22 MED ORDER — POTASSIUM CHLORIDE CRYS ER 20 MEQ PO TBCR
40.0000 meq | EXTENDED_RELEASE_TABLET | Freq: Two times a day (BID) | ORAL | Status: AC
  Administered 2020-07-22 (×2): 40 meq via ORAL
  Filled 2020-07-22 (×2): qty 2

## 2020-07-22 MED ORDER — FUROSEMIDE 10 MG/ML IJ SOLN
60.0000 mg | Freq: Once | INTRAMUSCULAR | Status: AC
  Administered 2020-07-22: 60 mg via INTRAVENOUS
  Filled 2020-07-22: qty 8

## 2020-07-22 NOTE — Progress Notes (Signed)
SUBJECTIVE: Feeling much better   Vitals:   07/21/20 1653 07/21/20 2001 07/22/20 0102 07/22/20 0723  BP: (!) 160/73  (!) 111/55 (!) 132/58  Pulse: 97  93 91  Resp: 20  20 17   Temp: 98.6 F (37 C)  98.7 F (37.1 C) 98.2 F (36.8 C)  TempSrc: Oral  Oral Oral  SpO2: 100% 100% 95% 96%  Weight:      Height:        Intake/Output Summary (Last 24 hours) at 07/22/2020 1019 Last data filed at 07/22/2020 1000 Gross per 24 hour  Intake 180 ml  Output 1700 ml  Net -1520 ml    LABS: Basic Metabolic Panel: Recent Labs    07/21/20 0430 07/22/20 0434  NA 139 140  K 3.7 3.4*  CL 99 96*  CO2 32 34*  GLUCOSE 98 105*  BUN 27* 25*  CREATININE 0.67 0.66  CALCIUM 8.8* 8.8*  MG 2.6* 2.3  PHOS 3.2 3.1   Liver Function Tests: Recent Labs    07/21/20 0430 07/22/20 0434  AST 14* 15  ALT 12 12  ALKPHOS 37* 48  BILITOT 1.1 0.9  PROT 5.0* 5.3*  ALBUMIN 2.8* 2.9*   No results for input(s): LIPASE, AMYLASE in the last 72 hours. CBC: Recent Labs    07/21/20 0430 07/22/20 0434  WBC 5.6 8.5  NEUTROABS 3.2 6.3  HGB 7.5* 7.6*  HCT 27.9* 28.4*  MCV 77.5* 76.8*  PLT 185 234   Cardiac Enzymes: No results for input(s): CKTOTAL, CKMB, CKMBINDEX, TROPONINI in the last 72 hours. BNP: Invalid input(s): POCBNP D-Dimer: No results for input(s): DDIMER in the last 72 hours. Hemoglobin A1C: No results for input(s): HGBA1C in the last 72 hours. Fasting Lipid Panel: No results for input(s): CHOL, HDL, LDLCALC, TRIG, CHOLHDL, LDLDIRECT in the last 72 hours. Thyroid Function Tests: No results for input(s): TSH, T4TOTAL, T3FREE, THYROIDAB in the last 72 hours.  Invalid input(s): FREET3 Anemia Panel: Recent Labs    07/20/20 0728 07/20/20 1824  VITAMINB12  --  801  FOLATE 7.6  --      PHYSICAL EXAM General: Well developed, well nourished, in no acute distress HEENT:  Normocephalic and atramatic Neck:  No JVD.  Lungs: Clear bilaterally to auscultation and percussion. Heart: HRRR  . Normal S1 and S2 without gallops or murmurs.  Abdomen: Bowel sounds are positive, abdomen soft and non-tender  Msk:  Back normal, normal gait. Normal strength and tone for age. Extremities: No clubbing, cyanosis or edema.   Neuro: Alert and oriented X 3. Psych:  Good affect, responds appropriately  TELEMETRY: Sinus rhythm  ASSESSMENT AND PLAN: Congestive heart failure due to HFpEF and coronary artery disease and anemia and recent CVA.  Patient is gradually getting better with diuretics.  Principal Problem:   Acute CVA (cerebrovascular accident) [facility]) Active Problems:   Coronary artery disease   Benign hypertension   Acute on chronic blood loss anemia   Severe Symptomatic anemia   Acute metabolic encephalopathy   COPD (chronic obstructive pulmonary disease) ([place])   Stroke (cerebrum) (HCC)    [name] A, MD, [facility] 07/22/2020 10:19 AM

## 2020-07-22 NOTE — Care Management Important Message (Signed)
Important Message  Patient Details  Name: Ashley [name] MRN: [id number] Date of Birth: 01-Aug-1931   Medicare Important Message Given:  Yes     Dannette Barbara 07/22/2020, 11:18 AM

## 2020-07-22 NOTE — TOC Progression Note (Signed)
Transition of Care [facility]) - Progression Note    Patient Details  Name: Ashley [name] MRN: [id number] Date of Birth: [birth year]  Transition of Care [facility]) CM/SW [place], RN Phone Number: 07/22/2020, 3:52 PM  Clinical Narrative:   RNCM met with patient and daughter at bedside to discuss updated recommendations for home health. Both patient and daughter are happy and agreeable to having home health set up. They report that she has never had services at home before and they do not have a preference as to which agency is used. They would just request an agency that will accept her insurance, they deny needs for any equipment.  RNCM reached out to [facility] with [facility] and he will accept referral.     Expected Discharge Plan: [place] Barriers to Discharge: Continued Medical Work up  Expected Discharge Plan and Services Expected Discharge Plan: [place]   Discharge Planning Services: CM Consult Post Acute Care Choice: [place] Living arrangements for the past 2 months: Single Family Home                           HH Arranged: NA           Social Determinants of Health (SDOH) Interventions    Readmission Risk Interventions No flowsheet data found.

## 2020-07-22 NOTE — Progress Notes (Addendum)
Physical Therapy Treatment Patient Details Name: Ashley [name] MRN: [id number] DOB: [birth year] Today's Date: 07/22/2020    History of Present Illness Per MD note:Katheleen E Smits is a 86 y.o. female with medical history significant for CAD, COPD, HTN who presents to the emergency room with altered mental status x2 days.  Most of the history is taken from the son who states that he noticed that his mother was lethargic and not her usual self and appeared to not recognize him and his symptoms worsened into today prompting him to bring her into the emergency room.  He states that she had been complaining of weakness for the past 2 weeks which she attributed to her age.  She saw her PCP 3 weeks prior and had blood work done and also recently saw her cardiologist.  He also states that she has been having hip pain and has been seeing the orthopedist and taking Tylenol but no NSAIDs for her pain.  She has had no cough, fever chills or shortness of breath.  No reports of nausea vomiting abdominal pain or change in bowel habits and no dysuria    PT Comments    Pt in bed needing to have a BM.  Pt stated "I feel bad today."  HR and O2 levels WFL but increased respirations noted.  She is able to get to EOB with min a x 1.  Stood and transferred to commode and back with min a x 1.  Needed mod a x 1 to get LE's back to bed.  Deferred further mobility at this time.  Discussed with RN.   Follow Up Recommendations  Home health PT;Supervision for mobility/OOB     Equipment Recommendations  None recommended by PT    Recommendations for Other Services       Precautions / Restrictions Precautions Precautions: Fall Restrictions Weight Bearing Restrictions: No    Mobility  Bed Mobility Overal bed mobility: Needs Assistance Bed Mobility: Supine to Sit;Sit to Supine     Supine to sit: Min assist Sit to supine: Mod assist   General bed mobility comments: increased assist today due to  malaise  Transfers Overall transfer level: Needs assistance Equipment used: None;1 person hand held assist Transfers: Sit to/from Stand;Stand Pivot Transfers Sit to Stand: Min assist Stand pivot transfers: Min assist          Ambulation/Gait             General Gait Details: deferred "i feel bad toay."   Stairs             Wheelchair Mobility    Modified Rankin (Stroke Patients Only)       Balance Overall balance assessment: Needs assistance Sitting-balance support: Feet supported Sitting balance-Leahy Scale: Good     Standing balance support: Single extremity supported Standing balance-Leahy Scale: Fair                              Cognition Arousal/Alertness: Awake/alert Behavior During Therapy: WFL for tasks assessed/performed Overall Cognitive Status: History of cognitive impairments - at baseline                                 General Comments: Pt was A and O x 3.Very coopwerative and pleasant throughout.      Exercises      General Comments  Pertinent Vitals/Pain Pain Assessment: No/denies pain    Home Living                      Prior Function            PT Goals (current goals can now be found in the care plan section) Progress towards PT goals: Progressing toward goals    Frequency    7X/week      PT Plan Discharge plan needs to be updated    Co-evaluation              AM-PAC PT "6 Clicks" Mobility   Outcome Measure  Help needed turning from your back to your side while in a flat bed without using bedrails?: A Little Help needed moving from lying on your back to sitting on the side of a flat bed without using bedrails?: A Little Help needed moving to and from a bed to a chair (including a wheelchair)?: A Little Help needed standing up from a chair using your arms (e.g., wheelchair or bedside chair)?: A Little Help needed to walk in hospital room?: A Little Help  needed climbing 3-5 steps with a railing? : A Little 6 Click Score: 18    End of Session Equipment Utilized During Treatment: Gait belt;Oxygen Activity Tolerance: Patient tolerated treatment well Patient left: in chair;with call bell/phone within reach;with chair alarm set;with family/visitor present Nurse Communication: Mobility status       Time: 3300-7622 PT Time Calculation (min) (ACUTE ONLY): 11 min  Charges:  $Therapeutic Activity: 8-22 mins                    Chesley Noon, PTA 07/22/20, 9:38 AM

## 2020-07-22 NOTE — Progress Notes (Signed)
Vonda Antigua, MD [address] [place], [place], [place], [address] [address], [place], [place], [place], [address] Phone: [phone]  Fax: [phone]   Subjective: Patient is hemodynamically stable.  No hematochezia.  Transitioned to IV iron   Objective: Exam: Vital signs in last 24 hours: Vitals:   07/21/20 1653 07/21/20 2001 07/22/20 0102 07/22/20 0723  BP: (!) 160/73  (!) 111/55 (!) 132/58  Pulse: 97  93 91  Resp: 20  20 17   Temp: 98.6 F (37 C)  98.7 F (37.1 C) 98.2 F (36.8 C)  TempSrc: Oral  Oral Oral  SpO2: 100% 100% 95% 96%  Weight:      Height:       Weight change:   Intake/Output Summary (Last 24 hours) at 07/22/2020 1314 Last data filed at 07/22/2020 1000 Gross per 24 hour  Intake 180 ml  Output 1700 ml  Net -1520 ml    General: No acute distress, AAO x3 Abd: Soft, NT/ND, No HSM Skin: Warm, no rashes Neck: Supple, Trachea midline   Lab Results: Lab Results  Component Value Date   WBC 8.5 07/22/2020   HGB 7.6 (L) 07/22/2020   HCT 28.4 (L) 07/22/2020   MCV 76.8 (L) 07/22/2020   PLT 234 07/22/2020   Micro Results: Recent Results (from the past 240 hour(s))  SARS Coronavirus 2 by RT PCR (hospital order, performed in [place] hospital lab) Nasopharyngeal Nasopharyngeal Swab     Status: None   Collection Time: 07/13/20 12:09 AM   Specimen: Nasopharyngeal Swab  Result Value Ref Range Status   SARS Coronavirus 2 NEGATIVE NEGATIVE Final    Comment: (NOTE) SARS-CoV-2 target nucleic acids are NOT DETECTED.  The SARS-CoV-2 RNA is generally detectable in upper and lower respiratory specimens during the acute phase of infection. The lowest concentration of SARS-CoV-2 viral copies this assay can detect is 250 copies / mL. A negative result does not preclude SARS-CoV-2 infection and should not be used as the sole basis for treatment or other patient management decisions.  A negative result may occur with improper specimen  collection / handling, submission of specimen other than nasopharyngeal swab, presence of viral mutation(s) within the areas targeted by this assay, and inadequate number of viral copies (<250 copies / mL). A negative result must be combined with clinical observations, patient history, and epidemiological information.  Fact Sheet for Patients:   StrictlyIdeas.no  Fact Sheet for Healthcare Providers: BankingDealers.co.za  This test is not yet approved or  cleared by the [place] FDA and has been authorized for detection and/or diagnosis of SARS-CoV-2 by FDA under an Emergency Use Authorization (EUA).  This EUA will remain in effect (meaning this test can be used) for the duration of the COVID-19 declaration under Section 564(b)(1) of the Act, 21 U.S.C. section 360bbb-3(b)(1), unless the authorization is terminated or revoked sooner.  Performed at [facility], [address] [place], [place] [address]    Studies/Results: [place] Chest 1 View  Result Date: 07/22/2020 CLINICAL DATA:  Shortness of breath. EXAM: CHEST  1 VIEW COMPARISON:  07/21/2020. FINDINGS: Cardiomegaly, pulmonary venous congestion, bilateral interstitial prominence, and small left pleural effusion again noted without interim change. Findings most consistent with persistent CHF. Pneumonitis cannot be excluded. Degenerative changes scoliosis thoracic spine. IMPRESSION: Cardiomegaly, pulmonary venous congestion, bilateral interstitial prominence, and small left pleural effusion again noted without interim change. Findings most consistent with persistent CHF. Pneumonitis cannot be excluded. Electronically Signed   By: Marcello Moores  Register  On: 07/22/2020 05:38   DG Chest 1 View  Result Date: 07/21/2020 CLINICAL DATA:  Shortness of breath EXAM: CHEST  1 VIEW COMPARISON:  07/20/2020 FINDINGS: Cardiomegaly and large hiatal hernia again noted. Pulmonary vascular congestion  and small bilateral pleural effusions, LEFT greater than RIGHT, and possible mild interstitial edema again noted. There is no evidence of pneumothorax. IMPRESSION: Little significant change with cardiomegaly, pulmonary vascular congestion, small bilateral pleural effusions and possible mild interstitial edema again noted. Electronically Signed   By: Margarette Canada M.D.   On: 07/21/2020 07:28   Medications:  Scheduled Meds: .  stroke: mapping our early stages of recovery book   Does not apply Once  . artificial tears   Both Eyes Q8H  . aspirin EC  81 mg Oral Daily  . feeding supplement (ENSURE ENLIVE)  237 mL Oral BID BM  . ipratropium  0.5 mg Nebulization TID  . isosorbide dinitrate  30 mg Oral BID  . levalbuterol  0.63 mg Nebulization TID  . metoprolol succinate  75 mg Oral Daily  . pantoprazole (PROTONIX) IV  40 mg Intravenous Q12H  . polyethylene glycol  17 g Oral Daily  . potassium chloride  40 mEq Oral BID  . senna-docusate  1 tablet Oral BID  . simvastatin  40 mg Oral Daily  . spironolactone  25 mg Oral Daily  . vitamin B-12  1,000 mcg Oral Daily   Continuous Infusions: PRN Meds:.acetaminophen **OR** acetaminophen (TYLENOL) oral liquid 160 mg/5 mL **OR** acetaminophen, ALPRAZolam, gadobutrol, HYDROcodone-acetaminophen, sodium chloride flush   Assessment: Principal Problem:   Acute CVA (cerebrovascular accident) ([place]) Active Problems:   Coronary artery disease   Benign hypertension   Acute on chronic blood loss anemia   Severe Symptomatic anemia   Acute metabolic encephalopathy   COPD (chronic obstructive pulmonary disease) ([place])   Stroke (cerebrum) ([place])    Plan: Patient remains high risk for endoscopic procedures Continue to monitor on IV iron and replace as necessary Hemoglobin stable in the last 24 to 48 hours  Dark stool likely from oral iron.  Continue to monitor stool color as she gets further out from her last oral iron dose  Continue serial CBCs and transfuse  as needed  No signs of active GI bleeding at this time.  Please page GI with any signs of active GI bleeding    LOS: 9 days   Vonda Antigua, MD 07/22/2020, 1:14 PM

## 2020-07-22 NOTE — Progress Notes (Signed)
PROGRESS NOTE    KATALIN COLLEDGE  KZS:[id number] DOB: [birth year] DOA: 07/12/2020 PCP: Albina Billet, MD   Brief Narrative:  HPI per Dr. Judd Gaudier on 07/13/20 HPI: Ashley [name] is a 86 y.o. female with medical history significant for CAD, COPD, HTN who presents to the emergency room with altered mental status x2 days.  Most of the history is taken from the son who states that he noticed that his mother was lethargic and not her usual self and appeared to not recognize him and his symptoms worsened into today prompting him to bring her into the emergency room.  He states that she had been complaining of weakness for the past 2 weeks which she attributed to her age.  She saw her PCP 3 weeks prior and had blood work done and also recently saw her cardiologist.  He also states that she has been having hip pain and has been seeing the orthopedist and taking Tylenol but no NSAIDs for her pain.  She has had no cough, fever chills or shortness of breath.  No reports of nausea vomiting abdominal pain or change in bowel habits and no dysuria ED Course: On arrival in the emergency room she was mildly tachypneic with otherwise normal vitals.  Blood work notable for hemoglobin of 4.7.  Last hemoglobin on file 10.7 from a year prior.  Stool guaiac positive.  Covid negative chest x-ray with no acute findings.  Head CT showed right occipital lobe hypodensity consistent with acute or subacute infarct.  Blood transfusion started in the ER.  Hospitalist consulted for admission.  Review of Systems: As per HPI otherwise all other systems on review of systems negative.  Unreliable due to mild lethargy  **Interim History She was evaluated by neurology and gastroenterology.  Gastroenterology recommending no further work-up at this time and recommending following up in outpatient once she is stable from a CVA perspective.  Neurology recommending aspirin 81 mg and also outpatient follow-up with neurosurgery given her  aneurysms.  Her respiratory status is improving and she has had no overt GI bleeding.  She was given another dose of IV Lasix today and is improving still remains volume overloaded so we have consulted Cardiology for further evaluation.  Repeat CBC today showed Hgb of 7.4.  Continues to be volume overloaded so we will continue IV diuresis with IV IV Lasix 40 twice daily and spironolactone for 5 mg p.o. daily per cardiology recommendations.  We have recommended for restriction to 1500 mL per if she continues to not lose weight and if she continues to not as strict as noted we may cut that back down to 1200.  Currently remains on supplemental oxygen via nasal cannula  07/20/20 She worked with therapy and ambulated well.  Continues to remain on supplemental oxygen.  She is down 4 pounds from yesterday and is diuresing.  We will give her some more IV Lasix this morning and this evening.  She had a large black stool and GI was reconsulted and they recommending stopping her iron supplementation given the patient IV iron remains relatively stable.  We will check a vitamin B12 level as well as a folate level and supplement her with IV iron.  GI does not feel that there is any clear evidence of a GI bleed and feels that the dark stools could be from the iron itself.  After an acute concern for GI bleeding evidenced by melena then they are recommending considering a tagged RBC scan.  We  have increased her PPI to IV twice daily now.  07/21/20 Patient continues to be volume overloaded so IV Diuresis was increased and will get IV 60 mg this AM and IV 60 mg this PM. Had a Black BM this AM. PT evaluated and now recommending Home Health.   07/22/20 Continues to get diuresed and we will give her another dose of IV 60 mg of Lasix this a.m. and another IV 60 this p.m.  Continues to have black bowel movements but hemoglobin has been relatively stable.  PT OT now are still recommending home health given her  improvement.  Assessment & Plan:   Principal Problem:   Acute CVA (cerebrovascular accident) [facility]) Active Problems:   Coronary artery disease   Benign hypertension   Acute on chronic blood loss anemia   Severe Symptomatic anemia   Acute metabolic encephalopathy   COPD (chronic obstructive pulmonary disease) (HCC)   Stroke (cerebrum) (HCC)  Acute/subacuteCVA (cerebrovascular accident) ([place]), in the setting of severe anemia Intracranial aneurysms/largest 10 mm Bilateral carotid stenosis -Seems to be back to baseline On admission:patient presents with confusion with right occipital lobe infarct on CT -Symptom onset 48 hours prior to arrival, outside TPA window and not a candidate either way due to positive stool guaiac and severe anemia -Echocardiogram: Reviewed and showed EF of 55 to 60%. "The left ventricle has normal function. The left ventricle has no regional wall motion abnormalities. Left ventricular diastolic parameters are  consistent with Grade I diastolicdysfunction (impaired relaxation). Right ventricular systolic function is normal. The right ventricular size is normal."  -Cardiac monitoring to evaluate for arrhythmias-negative for any dysrhythmias -MRI/MRA -consistent with aneurysms-right PICA right PCA ischemic changes Left and right ICA aneurysm largest 10 mm -Bilateral carotid study, right 70% stenosis, left 60-65% stenosis -Chest x-ray 07/15/2020 reviewed-and showed mild CHF exacerbation, atherosclerotic changes -Appreciate neurology follow-up and recommendations regarding current finding of aneurysm Neurology Recommended  "anticoagulation with aspirin, follow-up as an outpatient with neurosurgery regarding the largest 10 mm ICA aneurysm -for potential coiling in the future versus outpatient serial imaging in 3 to 6 months Given her age and comorbidities likely has had aneurysm for long time"  -Gastroenterology recommended and referred patient being started on aspirin to  neurology and because she has no sign of any GI bleeding currently we will start her on aspirin 81 mg p.o. daily and continue this for now; She remains High Risk for Endoscopic Procedures   Acute Respiratory Failure with Hypoxia in the setting of acute COPD versus concomitant Acute on Chronic Diastolic CHF, improving slightly  COPD (chronic obstructive pulmonary disease) (HCC)-mild exacerbation Acute on Chronic Diastolic CHF -Improved and is not in respiratory distress this a.m. -SpO2: 96 % O2 Flow Rate (L/min): 2 L/min continues to be on supplemental oxygen and will attempt weaning -Continue supplemental oxygen, nebs as needed; she was having Combivent and Xopenex scheduled but we have stopped the Combivent and started Xopenex and Atrovent scheduled every 6 -Mild COPD exacerbation versus volume overload in the setting of diastolic CHF exacerbation -Discontinue IV fluids -Lasix 40 mg x On  9/20, 9/21, 9/22, 9/23, 9/24, 9/25; ON 9/26 given IV 60 mg and today 9/27 will give IV Lasix 60 mg x1 this a.m. and will also give an additional dose IV 60 mg this p.m. -Patient was started on spironolactone 25 mg p.o. daily by cardiology and will continue -We will monitor I's and O's, daily weights; patient is -3,656.8 mL since admission and weight is up 8 lbs from  admission weight now and NOT Repeated today  - Intake/Output Summary (Last 24 hours) at 07/22/2020 1321 Last data filed at 07/22/2020 1000 Gross per 24 hour  Intake 180 ml  Output 1700 ml  Net -1520 ml  -We will also fluid restrict the patient to 1500 mL -BNP earlier was 339.1 -We have consulted cardiology for further evaluation recommendations and cardiology started the patient on spironolactone 25 mg daily -Chest x-ray this morning showed "Cardiomegaly, pulmonary venous congestion, bilateral interstitial prominence, and small left pleural effusion again noted without interim change. Findings most consistent with persistent CHF. Pneumonitis  cannot be excluded." -Continue to monitor and repeat chest x-ray in the a.m.  Acute onchronic Blood loss Anemia/ Severe Symptomatic Anemia -Status post 2 units of PRBC.   -On admission hemoglobin 4.7 ---2U PRBC transfused >> 8.1>>8.5  and has trended down but remained relatively stable in the low 7 range -She was Guaiac positive,--previously hemoglobin was >10.0 at baseline -Hemodynamically stable currently -GI consulted and they believe the patient might help chronic iron deficiency, possible multiple chronic microbleed -They are not recommending upper lower endoscopy at this time and want patient to be stable from a CVA perspective -Her hemoglobin is stable and slightly trended down but there is no signs of any GI bleeding noted.  -She did have a dark stool yesterday but this could be from the iron supplementation and she had another large dark stool so reconsulted GI and they feel the dark stool is from oral iron and recommending continuing to Monitor Stool Color as she gets further out from her last Oral Iron Dose (07/20/20) -GI feels this is anemia from chronic blood loss and he feels that there is nothing to do from a GI perspective until she is improved from her CVA which may be few weeks.  We have stopped her p.o. iron supplementation and have given her a dose of Feraheme -We have changed her ferrous sulfate 325 mg p.o. twice daily to iron disaccharides 150 mg p.o. daily and now stopped it altogether given GI reccs as she received IV Feraheme 07/20/20 -She may need infusion of IV iron but will hold off for now given her volume overload -Iron panel done and showed an iron level of 14, U IBC 391, TIBC of 405, saturation ratios of 4% -Repeat blood count today shows relatively stable hemoglobin/hematocrit now mid 7 ranges as it is 7.6/28.4 today -Continue to monitor for signs and symptoms of bleeding; currently no overt bleeding noted  Acute Metabolic Encephalopathy -Resolved back to baseline  alert oriented x3 -Likely due to anemia, versus stroke -We will continue treating underlying causing anemia and CVA -Continue neurochecks per protocol -Continue with delirium precautions at night  Coronary Artery Disease -Stable denies any chest pain -Initially held aspirin but have now resumed getting it 81 mg p.o. daily -Reviewed home medication, resuming Imdur, beta-blockers, -Continue with Simvastatin 40 mg p.o. daily that she has had a CVA -Cardiology has been consulted for further evaluation recommendations  Constipation -Was having bowel issues with her p.o. iron as well as her p.o. pain medications with hydrocodone-acetaminophen -Have stopped the docusate 100 g p.o. twice daily scheduled and have changed her to senna docusate 1 tab p.o. twice daily as well as MiraLAX 17 g daily -Continues to have Black Stool feels that she has no signs of active GI bleeding at this time and they continue to recommend to monitor her stool color as she gets further out from her last oral iron dose  Hyperlipidemia -Patient's total cholesterol/HDL ratio was 2.1, cholesterol level was 117, HDL 55, LDL is 47, triglycerides was 74, VLDL 15 -Continue simvastatin 40 g p.o. daily  Hypokalemia -Patient's K+ went from 3.8 -> 3.7 -> 3.4 and is in the setting of IV Diuretics -Replete with po KCl 40 mEQ BID x2 -Continue to Monitor and Replete as Necessary -Repeat CMP in the AM   Benign Hypertension -Blood pressures were running low due to severe anemia -Currently Withholding Amlodipine 10 mg po Daily and Lisinopril 10 mg po Daily -Cardiology also added spironolactone 25 mg p.o. daily -C/w Metoprolol Succinate 75 mg po Daily and with Isosorbide Dinitrate 30 mg po BID -Continue to monitor blood pressures per protocol last blood pressure is 132/58 this AM  Elevated BUN -Slightly worsening and has a mild association from her creatinine -BUN has gone from 22 -> 27 -> 32 -> 34 -> 27 -> 27 -> 25 -We will be  in the setting of her diuretics but will need to closely monitor for an upper GI bleed -She did have a dark stool today but she has been on iron supplementation and has not had any further bowel movements yesterday but today she had a very large dark stool and we reconsulted GI who does not feel that she is having active GI bleed with melena given stability of her hemoglobin and they recommend continue to monitor and stopping the oral iron  Obesity -Estimated body mass index is 34.42 kg/m as calculated from the following:   Height as of this encounter: 4\' 11"  (1.499 m).   Weight as of this encounter: 77.3 kg.  -Weight Loss and Dietary Counseling given   DVT prophylaxis: SCDs Code Status: DO NOT RESUSCITATE  Family Communication: Spoke with Son [name] at bedside Disposition Plan: SNF pending stabilization in Hgb/Hct and her heart failure and anticipating discharge when back to dry weight and off supplemental oxygen via nasal cannula  Status is: Inpatient  Remains inpatient appropriate because:Hemodynamically unstable and Inpatient level of care appropriate due to severity of illness   Dispo: The patient is from: Home              Anticipated d/c is to: SNF vs Home Health PT              Anticipated d/c date is:1-2 days              Patient currently is not medically stable to d/c.  Consultants:   Neurology Dr. Irish Elders  Gastroenterology Dr. Allen Norris  Cardiology Dr. Humphrey Rolls  Procedures: None  Antimicrobials:  Anti-infectives (From admission, onward)   None        Subjective: Seen and examined at bedside and son states that she had a little bit of a rough morning but feels better now.  She continues to have some black stool.  Still slightly dyspneic but has been urinating quite a bit.  No nausea or vomiting.  Working well with physical therapy and they have changed the recommendation to home health now.  Continues to remain volume overloaded so she is still getting diuresed.  No  other concerns or complaints at this time and son was at bedside and answered all his questions to his satisfaction.  Objective: Vitals:   07/21/20 1653 07/21/20 2001 07/22/20 0102 07/22/20 0723  BP: (!) 160/73  (!) 111/55 (!) 132/58  Pulse: 97  93 91  Resp: 20  20 17   Temp: 98.6 F (37 C)  98.7 F (37.1 C) 98.2  F (36.8 C)  TempSrc: Oral  Oral Oral  SpO2: 100% 100% 95% 96%  Weight:      Height:        Intake/Output Summary (Last 24 hours) at 07/22/2020 1321 Last data filed at 07/22/2020 1000 Gross per 24 hour  Intake 180 ml  Output 1700 ml  Net -1520 ml   Filed Weights   07/19/20 0427 07/20/20 0500 07/21/20 0500  Weight: 79.9 kg 78.1 kg 77.3 kg   Examination: Physical Exam:  Constitutional: WN/WD obese Caucasian female currently in no acute distress sitting up in the bed appears calm and comfortable but was a little dyspneic this morning. Eyes: Lids and conjunctivae normal, sclerae anicteric  ENMT: External Ears, Nose appear normal. Grossly normal hearing.  Neck: Appears normal, supple, no cervical masses, normal ROM, no appreciable thyromegaly; no appreciable JVD Respiratory: Diminished to auscultation bilaterally with coarse of breath sounds and some crackles noted bilaterally slightly worse on the left compared to right.  Has some very minimal rhonchi and continues to wear supplemental oxygen via nasal cannula but her O2 requirement is weaning and she is on 2 L. Cardiovascular: RRR, no murmurs / rubs / gallops. S1 and S2 auscultated.  Has 1+ lower extremity edema Abdomen: Soft, non-tender, distended secondary to body habitus. Bowel sounds positive.  GU: Deferred. Musculoskeletal: No clubbing / cyanosis of digits/nails. No joint deformity upper and lower extremities. Skin: No rashes, lesions, ulcers on limited skin evaluation. No induration; Warm and dry.  Neurologic: CN 2-12 grossly intact with no focal deficits. Romberg sign and cerebellar reflexes not assessed.   Psychiatric: Normal judgment and insight. Alert and oriented x 3. Normal mood and appropriate affect.   Data Reviewed: I have personally reviewed following labs and imaging studies  CBC: Recent Labs  Lab 07/18/20 0443 07/19/20 0350 07/20/20 0720 07/21/20 0430 07/22/20 0434  WBC 7.1 6.3 5.7 5.6 8.5  NEUTROABS 4.8 3.8 3.4 3.2 6.3  HGB 7.6* 7.4* 7.6* 7.5* 7.6*  HCT 28.0* 26.1* 27.3* 27.9* 28.4*  MCV 74.3* 72.9* 74.0* 77.5* 76.8*  PLT 148* 150 168 185 829   Basic Metabolic Panel: Recent Labs  Lab 07/18/20 0443 07/19/20 0350 07/20/20 0720 07/21/20 0430 07/22/20 0434  NA 139 139 139 139 140  K 3.7 3.9 3.8 3.7 3.4*  CL 101 99 98 99 96*  CO2 30 31 34* 32 34*  GLUCOSE 113* 110* 100* 98 105*  BUN 27* 32* 27* 27* 25*  CREATININE 0.63 0.65 0.64 0.67 0.66  CALCIUM 8.7* 8.4* 8.7* 8.8* 8.8*  MG 2.3 2.4 2.3 2.6* 2.3  PHOS 4.0 4.3 3.5 3.2 3.1   GFR: Estimated Creatinine Clearance: 45.3 mL/min (by C-G formula based on SCr of 0.66 mg/dL). Liver Function Tests: Recent Labs  Lab 07/18/20 0443 07/19/20 0350 07/20/20 0720 07/21/20 0430 07/22/20 0434  AST 12* 12* 15 14* 15  ALT 11 11 11 12 12   ALKPHOS 43 39 43 37* 48  BILITOT 0.6 0.7 0.9 1.1 0.9  PROT 5.0* 4.9* 5.1* 5.0* 5.3*  ALBUMIN 2.7* 2.6* 2.8* 2.8* 2.9*   No results for input(s): LIPASE, AMYLASE in the last 168 hours. No results for input(s): AMMONIA in the last 168 hours. Coagulation Profile: No results for input(s): INR, PROTIME in the last 168 hours. Cardiac Enzymes: No results for input(s): CKTOTAL, CKMB, CKMBINDEX, TROPONINI in the last 168 hours. BNP (last 3 results) No results for input(s): PROBNP in the last 8760 hours. HbA1C: No results for input(s): HGBA1C in the last 72  hours. CBG: Recent Labs  Lab 07/17/20 1133 07/17/20 1646  GLUCAP 139* 120*   Lipid Profile: No results for input(s): CHOL, HDL, LDLCALC, TRIG, CHOLHDL, LDLDIRECT in the last 72 hours. Thyroid Function Tests: No results for  input(s): TSH, T4TOTAL, FREET4, T3FREE, THYROIDAB in the last 72 hours. Anemia Panel: Recent Labs    07/20/20 0728 07/20/20 1824  VITAMINB12  --  801  FOLATE 7.6  --    Sepsis Labs: No results for input(s): PROCALCITON, LATICACIDVEN in the last 168 hours.  Recent Results (from the past 240 hour(s))  SARS Coronavirus 2 by RT PCR (hospital order, performed in [facility] hospital lab) Nasopharyngeal Nasopharyngeal Swab     Status: None   Collection Time: 07/13/20 12:09 AM   Specimen: Nasopharyngeal Swab  Result Value Ref Range Status   SARS Coronavirus 2 NEGATIVE NEGATIVE Final    Comment: (NOTE) SARS-CoV-2 target nucleic acids are NOT DETECTED.  The SARS-CoV-2 RNA is generally detectable in upper and lower respiratory specimens during the acute phase of infection. The lowest concentration of SARS-CoV-2 viral copies this assay can detect is 250 copies / mL. A negative result does not preclude SARS-CoV-2 infection and should not be used as the sole basis for treatment or other patient management decisions.  A negative result may occur with improper specimen collection / handling, submission of specimen other than nasopharyngeal swab, presence of viral mutation(s) within the areas targeted by this assay, and inadequate number of viral copies (<250 copies / mL). A negative result must be combined with clinical observations, patient history, and epidemiological information.  Fact Sheet for Patients:   StrictlyIdeas.no  Fact Sheet for Healthcare Providers: BankingDealers.co.za  This test is not yet approved or  cleared by the [place] FDA and has been authorized for detection and/or diagnosis of SARS-CoV-2 by FDA under an Emergency Use Authorization (EUA).  This EUA will remain in effect (meaning this test can be used) for the duration of the COVID-19 declaration under Section 564(b)(1) of the Act, 21 U.S.C. section  360bbb-3(b)(1), unless the authorization is terminated or revoked sooner.  Performed at [facility], [address] [place], [place] [address]      RN Pressure Injury Documentation:     Estimated body mass index is 34.42 kg/m as calculated from the following:   Height as of this encounter: 4\' 11"  (1.499 m).   Weight as of this encounter: 77.3 kg.  Malnutrition Type:  Nutrition Problem: Inadequate oral intake Etiology: decreased appetite   Malnutrition Characteristics:  Signs/Symptoms: per patient/family report   Nutrition Interventions:  Interventions: Ensure Enlive (each supplement provides 350kcal and 20 grams of protein)     Radiology Studies: DG Chest 1 View  Result Date: 07/22/2020 CLINICAL DATA:  Shortness of breath. EXAM: CHEST  1 VIEW COMPARISON:  07/21/2020. FINDINGS: Cardiomegaly, pulmonary venous congestion, bilateral interstitial prominence, and small left pleural effusion again noted without interim change. Findings most consistent with persistent CHF. Pneumonitis cannot be excluded. Degenerative changes scoliosis thoracic spine. IMPRESSION: Cardiomegaly, pulmonary venous congestion, bilateral interstitial prominence, and small left pleural effusion again noted without interim change. Findings most consistent with persistent CHF. Pneumonitis cannot be excluded. Electronically Signed   By: Marcello Moores  Register   On: 07/22/2020 05:38   DG Chest 1 View  Result Date: 07/21/2020 CLINICAL DATA:  Shortness of breath EXAM: CHEST  1 VIEW COMPARISON:  07/20/2020 FINDINGS: Cardiomegaly and large hiatal hernia again noted. Pulmonary vascular congestion and small bilateral pleural effusions, LEFT greater than  RIGHT, and possible mild interstitial edema again noted. There is no evidence of pneumothorax. IMPRESSION: Little significant change with cardiomegaly, pulmonary vascular congestion, small bilateral pleural effusions and possible mild interstitial edema again  noted. Electronically Signed   By: Margarette Canada M.D.   On: 07/21/2020 07:28   Scheduled Meds: .  stroke: mapping our early stages of recovery book   Does not apply Once  . artificial tears   Both Eyes Q8H  . aspirin EC  81 mg Oral Daily  . feeding supplement (ENSURE ENLIVE)  237 mL Oral BID BM  . ipratropium  0.5 mg Nebulization TID  . isosorbide dinitrate  30 mg Oral BID  . levalbuterol  0.63 mg Nebulization TID  . metoprolol succinate  75 mg Oral Daily  . pantoprazole (PROTONIX) IV  40 mg Intravenous Q12H  . polyethylene glycol  17 g Oral Daily  . potassium chloride  40 mEq Oral BID  . senna-docusate  1 tablet Oral BID  . simvastatin  40 mg Oral Daily  . spironolactone  25 mg Oral Daily  . vitamin B-12  1,000 mcg Oral Daily   Continuous Infusions:   LOS: 9 days   Kerney Elbe, DO [facility] PAGER is on AMION  If 7PM-7AM, please contact night-coverage [email or web address]

## 2020-07-23 ENCOUNTER — Inpatient Hospital Stay: Payer: Medicare Other

## 2020-07-23 ENCOUNTER — Encounter: Payer: Self-pay | Admitting: Family Medicine

## 2020-07-23 LAB — CBC WITH DIFFERENTIAL/PLATELET
Abs Immature Granulocytes: 0.05 10*3/uL (ref 0.00–0.07)
Basophils Absolute: 0.1 10*3/uL (ref 0.0–0.1)
Basophils Relative: 1 %
Eosinophils Absolute: 0.2 10*3/uL (ref 0.0–0.5)
Eosinophils Relative: 2 %
HCT: 30.2 % — ABNORMAL LOW (ref 36.0–46.0)
Hemoglobin: 8.5 g/dL — ABNORMAL LOW (ref 12.0–15.0)
Immature Granulocytes: 1 %
Lymphocytes Relative: 24 %
Lymphs Abs: 2 10*3/uL (ref 0.7–4.0)
MCH: 21 pg — ABNORMAL LOW (ref 26.0–34.0)
MCHC: 28.1 g/dL — ABNORMAL LOW (ref 30.0–36.0)
MCV: 74.8 fL — ABNORMAL LOW (ref 80.0–100.0)
Monocytes Absolute: 1 10*3/uL (ref 0.1–1.0)
Monocytes Relative: 11 %
Neutro Abs: 5.1 10*3/uL (ref 1.7–7.7)
Neutrophils Relative %: 61 %
Platelets: 289 10*3/uL (ref 150–400)
RBC: 4.04 MIL/uL (ref 3.87–5.11)
RDW: 28.6 % — ABNORMAL HIGH (ref 11.5–15.5)
Smear Review: NORMAL
WBC: 8.3 10*3/uL (ref 4.0–10.5)
nRBC: 0 % (ref 0.0–0.2)

## 2020-07-23 LAB — COMPREHENSIVE METABOLIC PANEL
ALT: 13 U/L (ref 0–44)
AST: 17 U/L (ref 15–41)
Albumin: 3.3 g/dL — ABNORMAL LOW (ref 3.5–5.0)
Alkaline Phosphatase: 53 U/L (ref 38–126)
Anion gap: 11 (ref 5–15)
BUN: 27 mg/dL — ABNORMAL HIGH (ref 8–23)
CO2: 31 mmol/L (ref 22–32)
Calcium: 9.2 mg/dL (ref 8.9–10.3)
Chloride: 95 mmol/L — ABNORMAL LOW (ref 98–111)
Creatinine, Ser: 0.77 mg/dL (ref 0.44–1.00)
GFR calc Af Amer: >60 mL/min (ref >60)
GFR calc non Af Amer: >60 mL/min (ref >60)
Glucose, Bld: 110 mg/dL — ABNORMAL HIGH (ref 70–99)
Potassium: 4.4 mmol/L (ref 3.5–5.1)
Sodium: 137 mmol/L (ref 135–145)
Total Bilirubin: 0.9 mg/dL (ref 0.3–1.2)
Total Protein: 5.9 g/dL — ABNORMAL LOW (ref 6.5–8.1)

## 2020-07-23 LAB — MAGNESIUM: Magnesium: 2.4 mg/dL (ref 1.7–2.4)

## 2020-07-23 LAB — PHOSPHORUS: Phosphorus: 3.4 mg/dL (ref 2.5–4.6)

## 2020-07-23 MED ORDER — IPRATROPIUM BROMIDE 0.02 % IN SOLN: 0.5000 mg | Freq: Four times a day (QID) | RESPIRATORY_TRACT | Status: DC | PRN

## 2020-07-23 MED ORDER — FUROSEMIDE 10 MG/ML IJ SOLN
60.0000 mg | Freq: Once | INTRAMUSCULAR | Status: AC
  Administered 2020-07-23: 60 mg via INTRAVENOUS
  Filled 2020-07-23: qty 8

## 2020-07-23 MED ORDER — LEVALBUTEROL HCL 0.63 MG/3ML IN NEBU
0.6300 mg | INHALATION_SOLUTION | Freq: Two times a day (BID) | RESPIRATORY_TRACT | Status: DC
  Administered 2020-07-23 – 2020-07-24 (×2): 0.63 mg via RESPIRATORY_TRACT
  Filled 2020-07-23 (×4): qty 3

## 2020-07-23 MED ORDER — LEVALBUTEROL HCL 0.63 MG/3ML IN NEBU
0.6300 mg | INHALATION_SOLUTION | Freq: Four times a day (QID) | RESPIRATORY_TRACT | Status: DC | PRN
  Filled 2020-07-23: qty 3

## 2020-07-23 MED ORDER — IPRATROPIUM BROMIDE 0.02 % IN SOLN
0.5000 mg | Freq: Two times a day (BID) | RESPIRATORY_TRACT | Status: DC
  Administered 2020-07-23 – 2020-07-24 (×2): 0.5 mg via RESPIRATORY_TRACT
  Filled 2020-07-23 (×2): qty 2.5

## 2020-07-23 NOTE — Progress Notes (Signed)
Occupational Therapy Treatment Patient Details Name: Ashley [name] MRN: [id number] DOB: 1931-08-27 Today's Date: 07/23/2020    History of present illness Per MD note:Ashley [name] is a 86 y.o. female with medical history significant for CAD, COPD, HTN who presents to the emergency room with altered mental status x2 days.  Most of the history is taken from the son who states that he noticed that his mother was lethargic and not her usual self and appeared to not recognize him and his symptoms worsened into today prompting him to bring her into the emergency room.  He states that she had been complaining of weakness for the past 2 weeks which she attributed to her age.  She saw her PCP 3 weeks prior and had blood work done and also recently saw her cardiologist.  He also states that she has been having hip pain and has been seeing the orthopedist and taking Tylenol but no NSAIDs for her pain.  She has had no cough, fever chills or shortness of breath.  No reports of nausea vomiting abdominal pain or change in bowel habits and no dysuria   OT comments  Pt seen for OT treatment this date to f/u re: safety with ADLs/ADL mobility. Pt wanting to use restroom and sitting up in chair when OT presents. Pt performs sit to stand from chair with CGA with RW and fxl mobility to restroom with CGA as well. MIN cues for safe use of RW. Pt requires MIN A with sit<>stand transfer from standard height commode as it is somewhat lower than chair surface. Pt able to perform standing peri care with CGA with use of grab bars. Pt able to perform standing hand hygiene with SUPV/CGA with RW for static stand. Pt perform fxl mobility back to bed from restroom without needing standing rest break. SpO2 noted to be >92% throughout session on RA. Pt demos improved tolerance and balance this date. Per PT, family reports they plan on taking her home and providing support. Because they report adequate support and pt progressing with fxl  mobility and ADLs, will update d/c recommendation to home with HHOT and 24/7 SUPV.    Follow Up Recommendations  Home health OT;Supervision/Assistance - 24 hour    Equipment Recommendations  3 in 1 bedside commode;Tub/shower seat    Recommendations for Other Services      Precautions / Restrictions Precautions Precautions: Fall Restrictions Weight Bearing Restrictions: No       Mobility Bed Mobility               General bed mobility comments: pt up to chair pre/post session  Transfers Overall transfer level: Needs assistance Equipment used: Rolling walker (2 wheeled) Transfers: Sit to/from Stand Sit to Stand: Min guard;Min assist         General transfer comment: CGA from chair/bed, but requires MIN A for sit<>stand from lower surface such as standard commode    Balance Overall balance assessment: Needs assistance Sitting-balance support: Feet supported Sitting balance-Leahy Scale: Good     Standing balance support: Bilateral upper extremity supported Standing balance-Leahy Scale: Fair Standing balance comment: demos improved stability/tolerance this date.                           ADL either performed or assessed with clinical judgement   ADL       Grooming: Wash/dry hands;Standing;Supervision/safety;Min guard;Cueing for sequencing Grooming Details (indicate cue type and reason): standing with RW to wash  hands                 Toilet Transfer: Minimal assistance;Ambulation;RW;Grab bars Toilet Transfer Details (indicate cue type and reason): MIN A with use of grab bar to standard commode, anticipate with elevated seat, she'd require less assistance Toileting- Clothing Manipulation and Hygiene: Min guard;Sit to/from stand Toileting - Clothing Manipulation Details (indicate cue type and reason): grab bars     Functional mobility during ADLs: Min guard;Cueing for safety;Rolling walker (no oxygen req'ed this date, satting >92% on RA  throughout session)       Vision Patient Visual Report: No change from baseline     Perception     Praxis      Cognition Arousal/Alertness: Awake/alert Behavior During Therapy: WFL for tasks assessed/performed Overall Cognitive Status: History of cognitive impairments - at baseline                                 General Comments: pt not oriented to place or situation and not oriented to day/date/month aspects of time, requires options to get correct year. Follows one step commands well.        Exercises Other Exercises Other Exercises: OT facilitates pt particpation in fxl mobility to restroom with CGA, transfer to commode with MIN A and CGA for standing peri care and hand hygiene. Pt tolerates well this date on RA, spO2 >92% throughout.   Shoulder Instructions       General Comments      Pertinent Vitals/ Pain       Pain Assessment: No/denies pain  Home Living                                          Prior Functioning/Environment              Frequency  Min 1X/week        Progress Toward Goals  OT Goals(current goals can now be found in the care plan section)  Progress towards OT goals: Progressing toward goals  Acute Rehab OT Goals Patient Stated Goal: to go home OT Goal Formulation: With patient/family Time For Goal Achievement: 07/28/20 Potential to Achieve Goals: Good  Plan Discharge plan needs to be updated;Frequency remains appropriate    Co-evaluation                 AM-PAC OT "6 Clicks" Daily Activity     Outcome Measure   Help from another person eating meals?: A Little Help from another person taking care of personal grooming?: A Little Help from another person toileting, which includes using toliet, bedpan, or urinal?: A Lot Help from another person bathing (including washing, rinsing, drying)?: A Lot Help from another person to put on and taking off regular upper body clothing?: A Little Help  from another person to put on and taking off regular lower body clothing?: A Lot 6 Click Score: 15    End of Session Equipment Utilized During Treatment: Gait belt;Rolling walker  OT Visit Diagnosis: Other abnormalities of gait and mobility (R26.89)   Activity Tolerance Patient tolerated treatment well   Patient Left with call bell/phone within reach;in chair;with chair alarm set;with family/visitor present   Nurse Communication          Time: 9323-5573 OT Time Calculation (min): 32 min  Charges: OT General Charges $OT  Visit: 1 Visit OT Treatments $Self Care/Home Management : 8-22 mins $Therapeutic Activity: 8-22 mins  Gerrianne Scale, MS, OTR/L ascom [phone] 07/23/20, 3:48 PM

## 2020-07-23 NOTE — Progress Notes (Signed)
SUBJECTIVE: Patient resting comfortably in bed.  Denies any shortness of breath or chest pain.  States her respiratory status has improved.  Patient did say she had some mild confusion last night but is now back to baseline again.   Vitals:   07/22/20 2140 07/22/20 2307 07/23/20 0722 07/23/20 0810  BP: 121/61 131/60 (!) 150/65   Pulse:  92 87 88  Resp:  17 20 20   Temp:  98.2 F (36.8 C) 98.2 F (36.8 C)   TempSrc:  Oral Oral   SpO2:  95% 97% 93%  Weight:      Height:        Intake/Output Summary (Last 24 hours) at 07/23/2020 0945 Last data filed at 07/23/2020 4132 Gross per 24 hour  Intake 180 ml  Output 1600 ml  Net -1420 ml    LABS: Basic Metabolic Panel: Recent Labs    07/22/20 0434 07/23/20 0349  NA 140 137  K 3.4* 4.4  CL 96* 95*  CO2 34* 31  GLUCOSE 105* 110*  BUN 25* 27*  CREATININE 0.66 0.77  CALCIUM 8.8* 9.2  MG 2.3 2.4  PHOS 3.1 3.4   Liver Function Tests: Recent Labs    07/22/20 0434 07/23/20 0349  AST 15 17  ALT 12 13  ALKPHOS 48 53  BILITOT 0.9 0.9  PROT 5.3* 5.9*  ALBUMIN 2.9* 3.3*   No results for input(s): LIPASE, AMYLASE in the last 72 hours. CBC: Recent Labs    07/22/20 0434 07/23/20 0349  WBC 8.5 8.3  NEUTROABS 6.3 5.1  HGB 7.6* 8.5*  HCT 28.4* 30.2*  MCV 76.8* 74.8*  PLT 234 289   Cardiac Enzymes: No results for input(s): CKTOTAL, CKMB, CKMBINDEX, TROPONINI in the last 72 hours. BNP: Invalid input(s): POCBNP D-Dimer: No results for input(s): DDIMER in the last 72 hours. Hemoglobin A1C: No results for input(s): HGBA1C in the last 72 hours. Fasting Lipid Panel: No results for input(s): CHOL, HDL, LDLCALC, TRIG, CHOLHDL, LDLDIRECT in the last 72 hours. Thyroid Function Tests: No results for input(s): TSH, T4TOTAL, T3FREE, THYROIDAB in the last 72 hours.  Invalid input(s): FREET3 Anemia Panel: Recent Labs    07/20/20 1824  VITAMINB12 801     PHYSICAL EXAM General: Well developed, well nourished, in no acute  distress HEENT:  Normocephalic and atramatic Neck:  No JVD.  Lungs: Clear bilaterally to auscultation and percussion. Heart: HRRR . Normal S1 and S2 without gallops or murmurs.  Abdomen: Bowel sounds are positive, abdomen soft and non-tender  Msk:  Back normal, normal gait. Normal strength and tone for age. Extremities: No clubbing, cyanosis or edema.   Neuro: Alert and oriented X 3. Psych:  Good affect, responds appropriately  TELEMETRY: NSR.  90/BPM  ASSESSMENT AND PLAN: Patient presenting to the emergency department and found to have an acute CVA as well as HFpEF vs COPD exacerbation.  Patient continues to require supplemental oxygen and continues to be mildly fluid overloaded.  With stable kidney function would recommend continuing IV diuresis for now.  For HFpEF please continue metoprolol and spironolactone.  With low diastolics we will hold on starting an ACE or ARB.  We will continue to follow.  Principal Problem:   Acute CVA (cerebrovascular accident) [facility]) Active Problems:   Coronary artery disease   Benign hypertension   Acute on chronic blood loss anemia   Severe Symptomatic anemia   Acute metabolic encephalopathy   COPD (chronic obstructive pulmonary disease) ([place])   Stroke (cerebrum) ([place])  Adaline Sill, NP-C 07/23/2020 9:45 AM

## 2020-07-23 NOTE — Progress Notes (Signed)
PROGRESS NOTE    Ashley [name]  BTD:[id number] DOB: [birth year] DOA: 07/12/2020 PCP: Albina Billet, MD   Brief Narrative:  HPI per Dr. Judd Gaudier on 07/13/20 HPI: Ashley [name] is a 86 y.o. female with medical history significant for CAD, COPD, HTN who presents to the emergency room with altered mental status x2 days.  Most of the history is taken from the son who states that he noticed that his mother was lethargic and not her usual self and appeared to not recognize him and his symptoms worsened into today prompting him to bring her into the emergency room.  He states that she had been complaining of weakness for the past 2 weeks which she attributed to her age.  She saw her PCP 3 weeks prior and had blood work done and also recently saw her cardiologist.  He also states that she has been having hip pain and has been seeing the orthopedist and taking Tylenol but no NSAIDs for her pain.  She has had no cough, fever chills or shortness of breath.  No reports of nausea vomiting abdominal pain or change in bowel habits and no dysuria ED Course: On arrival in the emergency room she was mildly tachypneic with otherwise normal vitals.  Blood work notable for hemoglobin of 4.7.  Last hemoglobin on file 10.7 from a year prior.  Stool guaiac positive.  Covid negative chest x-ray with no acute findings.  Head CT showed right occipital lobe hypodensity consistent with acute or subacute infarct.  Blood transfusion started in the ER.  Hospitalist consulted for admission.  Review of Systems: As per HPI otherwise all other systems on review of systems negative.  Unreliable due to mild lethargy  **Interim History She was evaluated by neurology and gastroenterology.  Gastroenterology recommending no further work-up at this time and recommending following up in outpatient once she is stable from a CVA perspective.  Neurology recommending aspirin 81 mg and also outpatient follow-up with neurosurgery given her  aneurysms.  Her respiratory status is improving and she has had no overt GI bleeding.  She was given another dose of IV Lasix today and is improving still remains volume overloaded so we have consulted Cardiology for further evaluation.  Repeat CBC today showed Hgb of 7.4.  Continues to be volume overloaded so we will continue IV diuresis with IV IV Lasix 40 twice daily and spironolactone for 5 mg p.o. daily per cardiology recommendations.  We have recommended for restriction to 1500 mL per if she continues to not lose weight and if she continues to not as strict as noted we may cut that back down to 1200.  Currently remains on supplemental oxygen via nasal cannula  07/20/20 She worked with therapy and ambulated well.  Continues to remain on supplemental oxygen.  She is down 4 pounds from yesterday and is diuresing.  We will give her some more IV Lasix this morning and this evening.  She had a large black stool and GI was reconsulted and they recommending stopping her iron supplementation given the patient IV iron remains relatively stable.  We will check a vitamin B12 level as well as a folate level and supplement her with IV iron.  GI does not feel that there is any clear evidence of a GI bleed and feels that the dark stools could be from the iron itself.  After an acute concern for GI bleeding evidenced by melena then they are recommending considering a tagged RBC scan.  We  have increased her PPI to IV twice daily now.  07/21/20 Patient continues to be volume overloaded so IV Diuresis was increased and will get IV 60 mg this AM and IV 60 mg this PM. Had a Black BM this AM. PT evaluated and now recommending Home Health.   07/22/20 Continues to get diuresed and we will give her another dose of IV 60 mg of Lasix this a.m. and another IV 60 this p.m.  Continues to have black bowel movements but hemoglobin has been relatively stable.  PT OT now are still recommending home health given her  improvement.  07/23/20 He is to be slightly volume overloaded so continue with IV diuresis.  Received IV 60 twice daily yesterday.  She is -5.136 L since admission.  Repeat weight was not done morning.  GI feel that she has no active bleeding her hemoglobin has trended up to 8.5 today.  GI recommending continuing to monitor her CBCs.  Assessment & Plan:   Principal Problem:   Acute CVA (cerebrovascular accident) [facility]) Active Problems:   Coronary artery disease   Benign hypertension   Acute on chronic blood loss anemia   Severe Symptomatic anemia   Acute metabolic encephalopathy   COPD (chronic obstructive pulmonary disease) (HCC)   Stroke (cerebrum) (HCC)  Acute/subacuteCVA (cerebrovascular accident) ([place]), in the setting of severe anemia Intracranial aneurysms/largest 10 mm Bilateral carotid stenosis -Seems to be back to baseline On admission:patient presents with confusion with right occipital lobe infarct on CT -Symptom onset 48 hours prior to arrival, outside TPA window and not a candidate either way due to positive stool guaiac and severe anemia -Echocardiogram: Reviewed and showed EF of 55 to 60%. "The left ventricle has normal function. The left ventricle has no regional wall motion abnormalities. Left ventricular diastolic parameters are  consistent with Grade I diastolicdysfunction (impaired relaxation). Right ventricular systolic function is normal. The right ventricular size is normal."  -Cardiac monitoring to evaluate for arrhythmias-negative for any dysrhythmias -MRI/MRA -consistent with aneurysms-right PICA right PCA ischemic changes Left and right ICA aneurysm largest 10 mm -Bilateral carotid study, right 70% stenosis, left 60-65% stenosis -Chest x-ray 07/15/2020 reviewed-and showed mild CHF exacerbation, atherosclerotic changes -Appreciate neurology follow-up and recommendations regarding current finding of aneurysm Neurology Recommended  "anticoagulation with aspirin,  follow-up as an outpatient with neurosurgery regarding the largest 10 mm ICA aneurysm -for potential coiling in the future versus outpatient serial imaging in 3 to 6 months Given her age and comorbidities likely has had aneurysm for long time"  -Gastroenterology recommended and referred patient being started on aspirin to neurology and because she has no sign of any GI bleeding currently we will start her on aspirin 81 mg p.o. daily and continue this for now; She remains High Risk for Endoscopic Procedures   Acute Respiratory Failure with Hypoxia in the setting of acute COPD versus concomitant Acute on Chronic Diastolic CHF, improving slightly  COPD (chronic obstructive pulmonary disease) (HCC)-mild exacerbation Acute on Chronic Diastolic CHF -Improved and is not in respiratory distress this a.m. -SpO2: 93 % O2 Flow Rate (L/min): 2 L/min continues to be on supplemental oxygen and will attempt weaning today and I have asked nursing to wean off -Continue supplemental oxygen, nebs as needed; she was having Combivent and Xopenex scheduled but we have stopped the Combivent and started Xopenex and Atrovent scheduled every 6 -Mild COPD exacerbation versus volume overload in the setting of diastolic CHF exacerbation -Discontinue IV fluids -Lasix 40 mg x On  9/20, 9/21,  9/22, 9/23, 9/24, 9/25; ON 9/26 given IV 60 mg and yesterday 9/27 was given IV Lasix 60 mg x1 this a.m. and will also give an additional dose IV 60 mg this p.m; will repeat yesterday's dosing with IV twice daily -Patient was started on spironolactone 25 mg p.o. daily by cardiology and will continue -We will monitor I's and O's, daily weights; patient is - 5,136.8 mL since admission and weight is up 8 lbs from admission weight now and NOT Repeated today again -  Intake/Output Summary (Last 24 hours) at 07/23/2020 1432 Last data filed at 07/23/2020 1356 Gross per 24 hour  Intake 120 ml  Output 1600 ml  Net -1480 ml  -We will also fluid  restrict the patient to 1500 mL -BNP earlier was 339.1 -We have consulted cardiology for further evaluation recommendations and cardiology started the patient on spironolactone 25 mg daily -Chest x-ray this morning showed "Cardiac enlargement with mild vascular congestion and small left pleural effusion. Improvement since prior study. No edema or consolidation." -Continue to monitor and repeat chest x-ray in the a.m.  Acute onchronic Blood loss Anemia/ Severe Symptomatic Anemia -Status post 2 units of PRBC.   -On admission hemoglobin 4.7 ---2U PRBC transfused >> 8.1>>8.5  and has trended down but remained relatively stable in the low 7 range -She was Guaiac positive,--previously hemoglobin was >10.0 at baseline -Hemodynamically stable currently -GI consulted and they believe the patient might help chronic iron deficiency, possible multiple chronic microbleed -They are not recommending upper lower endoscopy at this time and want patient to be stable from a CVA perspective -Her hemoglobin is stable and slightly trended down but there is no signs of any GI bleeding noted.  -She did have a dark stool yesterday but this could be from the iron supplementation and she had another large dark stool so reconsulted GI and they feel the dark stool is from oral iron and recommending continuing to Monitor Stool Color as she gets further out from her last Oral Iron Dose (07/20/20) -GI feels this is anemia from chronic blood loss and he feels that there is nothing to do from a GI perspective until she is improved from her CVA which may be few weeks.  We have stopped her p.o. iron supplementation and have given her a dose of Feraheme -We have changed her ferrous sulfate 325 mg p.o. twice daily to iron disaccharides 150 mg p.o. daily and now stopped it altogether given GI reccs as she received IV Feraheme 07/20/20 -She may need infusion of IV iron but will hold off for now given her volume overload -Iron panel done  and showed an iron level of 14, U IBC 391, TIBC of 405, saturation ratios of 4% -Repeat blood count today shows relatively stable hemoglobin/hematocrit now mid 7 ranges as it is 8.5/30.2 today and is improved  -Continue to monitor for signs and symptoms of bleeding; currently no overt bleeding noted  Acute Metabolic Encephalopathy -Resolved back to baseline alert oriented x3 -Likely due to anemia, versus stroke -We will continue treating underlying causing anemia and CVA -Continue neurochecks per protocol -Continue with delirium precautions at night  Coronary Artery Disease -Stable denies any chest pain -Initially held aspirin but have now resumed getting it 81 mg p.o. daily -Reviewed home medication, resuming Imdur, beta-blockers, -Continue with Simvastatin 40 mg p.o. daily that she has had a CVA -Cardiology has been consulted for further evaluation recommendations  Constipation -Was having bowel issues with her p.o. iron as well as  her p.o. pain medications with hydrocodone-acetaminophen -Have stopped the docusate 100 g p.o. twice daily scheduled and have changed her to senna docusate 1 tab p.o. twice daily as well as MiraLAX 17 g daily -Continues to have Black Stool feels that she has no signs of active GI bleeding at this time and they continue to recommend to monitor her stool color as she gets further out from her last oral iron dose  Hyperlipidemia -Patient's total cholesterol/HDL ratio was 2.1, cholesterol level was 117, HDL 55, LDL is 47, triglycerides was 74, VLDL 15 -Continue simvastatin 40 g p.o. daily  Hypokalemia -Patient's K+ went from 3.8 -> 3.7 -> 3.4 and is in the setting of IV Diuretics and is improved to 4.4 today -Replete with po KCl 40 mEQ BID x2 yesterday -Continue to Monitor and Replete as Necessary -Repeat CMP in the AM   Benign Hypertension -Blood pressures were running low due to severe anemia -Currently Withholding Amlodipine 10 mg po Daily and  Lisinopril 10 mg po Daily -Cardiology also added spironolactone 25 mg p.o. daily -C/w Metoprolol Succinate 75 mg po Daily and with Isosorbide Dinitrate 30 mg po BID -Continue to monitor blood pressures per protocol last blood pressure is 150/65  this AM  Elevated BUN -Slightly worsening and has a mild association from her creatinine -BUN has gone from 22 -> 27 -> 32 -> 34 -> 27 -> 27 -> 25 -> 27 -We will be in the setting of her diuretics but will need to closely monitor for an upper GI bleed -She did have a dark stool today but she has been on iron supplementation and has not had any further bowel movements yesterday but today she had a very large dark stool and we reconsulted GI who does not feel that she is having active GI bleed with melena given stability of her hemoglobin and they recommend continue to monitor and stopping the oral iron  Obesity -Estimated body mass index is 34.42 kg/m as calculated from the following:   Height as of this encounter: 4\' 11"  (1.499 m).   Weight as of this encounter: 77.3 kg.  -Weight Loss and Dietary Counseling given -Nutritionist consulted for further evaluation recommendations and they are recommending Ensure Enlive p.o. twice daily  DVT prophylaxis: SCDs Code Status: DO NOT RESUSCITATE  Family Communication: Spoke with Son [name] at bedside Disposition Plan: SNF intially recommended pending stabilization in Hgb/Hct and her heart failure but now she has progressed for Home Health PT/OR and anticipating discharge when back to dry weight and off supplemental oxygen via nasal cannula  Status is: Inpatient  Remains inpatient appropriate because:Hemodynamically unstable and Inpatient level of care appropriate due to severity of illness   Dispo: The patient is from: Home              Anticipated d/c is to: SNF vs Home Health PT              Anticipated d/c date is:1-2 days if can get diuresed back to Dry Weight               Patient currently is not  medically stable to d/c.  Consultants:   Neurology Dr. Irish Elders  Gastroenterology Dr. Allen Norris  Cardiology Dr. Humphrey Rolls  Procedures: None  Antimicrobials:  Anti-infectives (From admission, onward)   None        Subjective: Seen and examined at bedside and she was a little confused overnight but much better today.  Denies any nausea or vomiting.  Thinks her breathing is doing a little bit better.  Has been ambulating with therapy and working well with them.  Continues to be a little puffy and still volume overloaded.  Respiratory status is improving will attempt weaning.  No other concerns or complaints at this time.  Objective: Vitals:   07/22/20 2140 07/22/20 2307 07/23/20 0722 07/23/20 0810  BP: 121/61 131/60 (!) 150/65   Pulse:  92 87 88  Resp:  17 20 20   Temp:  98.2 F (36.8 C) 98.2 F (36.8 C)   TempSrc:  Oral Oral   SpO2:  95% 97% 93%  Weight:      Height:        Intake/Output Summary (Last 24 hours) at 07/23/2020 1432 Last data filed at 07/23/2020 1356 Gross per 24 hour  Intake 120 ml  Output 1600 ml  Net -1480 ml   Filed Weights   07/19/20 0427 07/20/20 0500 07/21/20 0500  Weight: 79.9 kg 78.1 kg 77.3 kg   Examination: Physical Exam:  Constitutional: WN/WD obese Caucasian female currently in no acute distress appears calm and sitting up in the bed and does appear comfortable and not as dyspneic. Eyes: Lids and conjunctivae normal, sclerae anicteric  ENMT: External Ears, Nose appear normal. Grossly normal hearing.  Neck: Appears normal, supple, no cervical masses, normal ROM, no appreciable thyromegaly; no JVD Respiratory: Diminished to auscultation bilaterally with coarse breath sounds and some crackles noted.  Rhonchi is improved but she continues to wear supplemental oxygen via nasal cannula and still on 2 L. Cardiovascular: RRR, no murmurs / rubs / gallops. S1 and S2 auscultated.  Has 1+ lower extremity edema Abdomen: Soft, non-tender, distended secondary by  habitus. Bowel sounds positive.  GU: Deferred. Musculoskeletal: No clubbing / cyanosis of digits/nails. No joint deformity upper and lower extremities.   Skin: No rashes, lesions, ulcers on limited skin evaluation. No induration; Warm and dry.  Neurologic: CN 2-12 grossly intact with no focal deficits. Romberg sign and cerebellar reflexes not assessed.  Psychiatric: Normal judgment and insight. Alert and oriented x 3. Normal mood and appropriate affect.   Data Reviewed: I have personally reviewed following labs and imaging studies  CBC: Recent Labs  Lab 07/19/20 0350 07/20/20 0720 07/21/20 0430 07/22/20 0434 07/23/20 0349  WBC 6.3 5.7 5.6 8.5 8.3  NEUTROABS 3.8 3.4 3.2 6.3 5.1  HGB 7.4* 7.6* 7.5* 7.6* 8.5*  HCT 26.1* 27.3* 27.9* 28.4* 30.2*  MCV 72.9* 74.0* 77.5* 76.8* 74.8*  PLT 150 168 185 234 944   Basic Metabolic Panel: Recent Labs  Lab 07/19/20 0350 07/20/20 0720 07/21/20 0430 07/22/20 0434 07/23/20 0349  NA 139 139 139 140 137  K 3.9 3.8 3.7 3.4* 4.4  CL 99 98 99 96* 95*  CO2 31 34* 32 34* 31  GLUCOSE 110* 100* 98 105* 110*  BUN 32* 27* 27* 25* 27*  CREATININE 0.65 0.64 0.67 0.66 0.77  CALCIUM 8.4* 8.7* 8.8* 8.8* 9.2  MG 2.4 2.3 2.6* 2.3 2.4  PHOS 4.3 3.5 3.2 3.1 3.4   GFR: Estimated Creatinine Clearance: 45.3 mL/min (by C-G formula based on SCr of 0.77 mg/dL). Liver Function Tests: Recent Labs  Lab 07/19/20 0350 07/20/20 0720 07/21/20 0430 07/22/20 0434 07/23/20 0349  AST 12* 15 14* 15 17  ALT 11 11 12 12 13   ALKPHOS 39 43 37* 48 53  BILITOT 0.7 0.9 1.1 0.9 0.9  PROT 4.9* 5.1* 5.0* 5.3* 5.9*  ALBUMIN 2.6* 2.8* 2.8* 2.9* 3.3*   No results  for input(s): LIPASE, AMYLASE in the last 168 hours. No results for input(s): AMMONIA in the last 168 hours. Coagulation Profile: No results for input(s): INR, PROTIME in the last 168 hours. Cardiac Enzymes: No results for input(s): CKTOTAL, CKMB, CKMBINDEX, TROPONINI in the last 168 hours. BNP (last 3  results) No results for input(s): PROBNP in the last 8760 hours. HbA1C: No results for input(s): HGBA1C in the last 72 hours. CBG: Recent Labs  Lab 07/17/20 1133 07/17/20 1646  GLUCAP 139* 120*   Lipid Profile: No results for input(s): CHOL, HDL, LDLCALC, TRIG, CHOLHDL, LDLDIRECT in the last 72 hours. Thyroid Function Tests: No results for input(s): TSH, T4TOTAL, FREET4, T3FREE, THYROIDAB in the last 72 hours. Anemia Panel: Recent Labs    07/20/20 1824  VITAMINB12 801   Sepsis Labs: No results for input(s): PROCALCITON, LATICACIDVEN in the last 168 hours.  No results found for this or any previous visit (from the past 240 hour(s)).   RN Pressure Injury Documentation:     Estimated body mass index is 34.42 kg/m as calculated from the following:   Height as of this encounter: 4\' 11"  (1.499 m).   Weight as of this encounter: 77.3 kg.  Malnutrition Type:  Nutrition Problem: Inadequate oral intake Etiology: decreased appetite   Malnutrition Characteristics:  Signs/Symptoms: per patient/family report   Nutrition Interventions:  Interventions: Ensure Enlive (each supplement provides 350kcal and 20 grams of protein)     Radiology Studies: DG Chest 1 View  Result Date: 07/23/2020 CLINICAL DATA:  Shortness of breath. Altered mental status for 2 days. EXAM: CHEST  1 VIEW COMPARISON:  07/22/2020 FINDINGS: Shallow inspiration. Cardiac enlargement. Mild vascular congestion. No edema or consolidation. Small left pleural effusion is less prominent than on prior study. No pneumothorax. Calcification of the aorta. IMPRESSION: Cardiac enlargement with mild vascular congestion and small left pleural effusion. Improvement since prior study. No edema or consolidation. Electronically Signed   By: Lucienne Capers M.D.   On: 07/23/2020 06:30   DG Chest 1 View  Result Date: 07/22/2020 CLINICAL DATA:  Shortness of breath. EXAM: CHEST  1 VIEW COMPARISON:  07/21/2020. FINDINGS:  Cardiomegaly, pulmonary venous congestion, bilateral interstitial prominence, and small left pleural effusion again noted without interim change. Findings most consistent with persistent CHF. Pneumonitis cannot be excluded. Degenerative changes scoliosis thoracic spine. IMPRESSION: Cardiomegaly, pulmonary venous congestion, bilateral interstitial prominence, and small left pleural effusion again noted without interim change. Findings most consistent with persistent CHF. Pneumonitis cannot be excluded. Electronically Signed   By: Marcello Moores  Register   On: 07/22/2020 05:38   Scheduled Meds: .  stroke: mapping our early stages of recovery book   Does not apply Once  . artificial tears   Both Eyes Q8H  . aspirin EC  81 mg Oral Daily  . feeding supplement (ENSURE ENLIVE)  237 mL Oral BID BM  . ipratropium  0.5 mg Nebulization BID  . isosorbide dinitrate  30 mg Oral BID  . levalbuterol  0.63 mg Nebulization BID  . metoprolol succinate  75 mg Oral Daily  . pantoprazole (PROTONIX) IV  40 mg Intravenous Q12H  . polyethylene glycol  17 g Oral Daily  . senna-docusate  1 tablet Oral BID  . simvastatin  40 mg Oral Daily  . spironolactone  25 mg Oral Daily  . vitamin B-12  1,000 mcg Oral Daily   Continuous Infusions:   LOS: 10 days   Kerney Elbe, DO [facility] PAGER is on [place]  If 7PM-7AM,  please contact night-coverage [email or web address]

## 2020-07-23 NOTE — Progress Notes (Signed)
Vonda Antigua, MD [address], [place], [place], [place], [address] [address], [place], [place], [place], [address] Phone: [phone]  Fax: [phone]   Subjective: No active GI bleeding.  No abdominal pain.  Tolerating oral diet.  Son at bedside.   Objective: Exam: Vital signs in last 24 hours: Vitals:   07/22/20 2140 07/22/20 2307 07/23/20 0722 07/23/20 0810  BP: 121/61 131/60 (!) 150/65   Pulse:  92 87 88  Resp:  17 20 20   Temp:  98.2 F (36.8 C) 98.2 F (36.8 C)   TempSrc:  Oral Oral   SpO2:  95% 97% 93%  Weight:      Height:       Weight change:   Intake/Output Summary (Last 24 hours) at 07/23/2020 1259 Last data filed at 07/23/2020 7425 Gross per 24 hour  Intake --  Output 1600 ml  Net -1600 ml    General: No acute distress, AAO x3 Abd: Soft, NT/ND, No HSM Skin: Warm, no rashes Neck: Supple, Trachea midline   Lab Results: Lab Results  Component Value Date   WBC 8.3 07/23/2020   HGB 8.5 (L) 07/23/2020   HCT 30.2 (L) 07/23/2020   MCV 74.8 (L) 07/23/2020   PLT 289 07/23/2020   Micro Results: No results found for this or any previous visit (from the past 240 hour(s)). Studies/Results: DG Chest 1 View  Result Date: 07/23/2020 CLINICAL DATA:  Shortness of breath. Altered mental status for 2 days. EXAM: CHEST  1 VIEW COMPARISON:  07/22/2020 FINDINGS: Shallow inspiration. Cardiac enlargement. Mild vascular congestion. No edema or consolidation. Small left pleural effusion is less prominent than on prior study. No pneumothorax. Calcification of the aorta. IMPRESSION: Cardiac enlargement with mild vascular congestion and small left pleural effusion. Improvement since prior study. No edema or consolidation. Electronically Signed   By: Lucienne Capers M.D.   On: 07/23/2020 06:30   DG Chest 1 View  Result Date: 07/22/2020 CLINICAL DATA:  Shortness of breath. EXAM: CHEST  1 VIEW COMPARISON:  07/21/2020. FINDINGS: Cardiomegaly, pulmonary venous  congestion, bilateral interstitial prominence, and small left pleural effusion again noted without interim change. Findings most consistent with persistent CHF. Pneumonitis cannot be excluded. Degenerative changes scoliosis thoracic spine. IMPRESSION: Cardiomegaly, pulmonary venous congestion, bilateral interstitial prominence, and small left pleural effusion again noted without interim change. Findings most consistent with persistent CHF. Pneumonitis cannot be excluded. Electronically Signed   By: Marcello Moores  Register   On: 07/22/2020 05:38   Medications:  Scheduled Meds: .  stroke: mapping our early stages of recovery book   Does not apply Once  . artificial tears   Both Eyes Q8H  . aspirin EC  81 mg Oral Daily  . feeding supplement (ENSURE ENLIVE)  237 mL Oral BID BM  . ipratropium  0.5 mg Nebulization BID  . isosorbide dinitrate  30 mg Oral BID  . levalbuterol  0.63 mg Nebulization BID  . metoprolol succinate  75 mg Oral Daily  . pantoprazole (PROTONIX) IV  40 mg Intravenous Q12H  . polyethylene glycol  17 g Oral Daily  . senna-docusate  1 tablet Oral BID  . simvastatin  40 mg Oral Daily  . spironolactone  25 mg Oral Daily  . vitamin B-12  1,000 mcg Oral Daily   Continuous Infusions: PRN Meds:.acetaminophen **OR** acetaminophen (TYLENOL) oral liquid 160 mg/5 mL **OR** acetaminophen, ALPRAZolam, gadobutrol, HYDROcodone-acetaminophen, ipratropium, levalbuterol, sodium chloride flush   Assessment: Principal Problem:   Acute CVA (cerebrovascular accident) ([facility]) Active  Problems:   Coronary artery disease   Benign hypertension   Acute on chronic blood loss anemia   Severe Symptomatic anemia   Acute metabolic encephalopathy   COPD (chronic obstructive pulmonary disease) ([facility])   Stroke (cerebrum) ([place])    Plan: Patient has not had any active GI bleeding Continue monitoring CBC Please page GI with any signs of active GI bleeding Patient remains at high risk for endoscopic  procedures  Cardiology following   LOS: 10 days   Vonda Antigua, MD 07/23/2020, 12:59 PM

## 2020-07-23 NOTE — Plan of Care (Signed)
  Problem: Education: Goal: Knowledge of General Education information will improve Description: Including pain rating scale, medication(s)/side effects and non-pharmacologic comfort measures Outcome: Progressing   Problem: Health Behavior/Discharge Planning: Goal: Ability to manage health-related needs will improve Outcome: Progressing   Problem: Clinical Measurements: Goal: Ability to maintain clinical measurements within normal limits will improve Outcome: Progressing Goal: Will remain free from infection Outcome: Progressing Goal: Diagnostic test results will improve Outcome: Progressing Goal: Respiratory complications will improve Outcome: Progressing Goal: Cardiovascular complication will be avoided Outcome: Progressing   Problem: Activity: Goal: Risk for activity intolerance will decrease Outcome: Progressing   Problem: Nutrition: Goal: Adequate nutrition will be maintained Outcome: Progressing   Problem: Coping: Goal: Level of anxiety will decrease Outcome: Progressing   Problem: Elimination: Goal: Will not experience complications related to bowel motility Outcome: Progressing Goal: Will not experience complications related to urinary retention Outcome: Progressing   Problem: Pain Managment: Goal: General experience of comfort will improve Outcome: Progressing   Problem: Safety: Goal: Ability to remain free from injury will improve Outcome: Progressing   Problem: Skin Integrity: Goal: Risk for impaired skin integrity will decrease Outcome: Progressing   Problem: Education: Goal: Knowledge of secondary prevention will improve Outcome: Progressing Goal: Knowledge of patient specific risk factors addressed and post discharge goals established will improve Outcome: Progressing Goal: Individualized Educational Video(s) Outcome: Progressing

## 2020-07-23 NOTE — Progress Notes (Signed)
[facility] Liaison note:  New referral for [facility] community Palliative program to follow at home received from [facility]. Patient information sent to referral. Patient to have [facility] following as well.  Flo Shanks BSN, RN, [place] [phone]

## 2020-07-23 NOTE — Progress Notes (Signed)
Physical Therapy Treatment Patient Details Name: Ashley [name] MRN: [id number] DOB: 05-Jun-1931 Today's Date: 07/23/2020    History of Present Illness Per MD note:Ashley [name] is a 86 y.o. female with medical history significant for CAD, COPD, HTN who presents to the emergency room with altered mental status x2 days.  Most of the history is taken from the son who states that he noticed that his mother was lethargic and not her usual self and appeared to not recognize him and his symptoms worsened into today prompting him to bring her into the emergency room.  He states that she had been complaining of weakness for the past 2 weeks which she attributed to her age.  She saw her PCP 3 weeks prior and had blood work done and also recently saw her cardiologist.  He also states that she has been having hip pain and has been seeing the orthopedist and taking Tylenol but no NSAIDs for her pain.  She has had no cough, fever chills or shortness of breath.  No reports of nausea vomiting abdominal pain or change in bowel habits and no dysuria    PT Comments    Pt was long sitting in bed on 2 L O2 upon arriving with supportive son present. She was 96% with [place] not in correct position. Discontinued O2 throughout session and pt able to maintain > 91% without. Pt was able to exit R side of bed with increased time and CGA for safety. Stood to [employer] with CGA and ambulated 200 ft with CGA for safety. Overall pt is progressing well and progressing well towards all PT goals. Will benefit from skilled PT at home when DC to address deficits and improve safe functional abilities. Son feels confident he and family can safely take pt home. She was sitting in recliner with call bell in reach and RN aware of pt's wean of O2.     Follow Up Recommendations  Home health PT;Supervision for mobility/OOB     Equipment Recommendations  None recommended by PT    Recommendations for Other Services       Precautions / Restrictions  Precautions Precautions: Fall Restrictions Weight Bearing Restrictions: No    Mobility  Bed Mobility Overal bed mobility: Needs Assistance Bed Mobility: Supine to Sit     Supine to sit: Min guard     General bed mobility comments: INcreased time to perform. vcs for improved technique and seuqnencing  Transfers Overall transfer level: Needs assistance Equipment used: Rolling walker (2 wheeled) Transfers: Sit to/from Stand Sit to Stand: Min guard         General transfer comment: CGA for safety from slightly elevated bed height  Ambulation/Gait Ambulation/Gait assistance: Min guard Gait Distance (Feet): 200 Feet Assistive device: Rolling walker (2 wheeled) Gait Pattern/deviations: Step-through pattern Gait velocity: decreased   General Gait Details: Pt was able to ambulate 200 ft with RW without difficulty or LOB       Balance Overall balance assessment: Needs assistance Sitting-balance support: Feet supported Sitting balance-Leahy Scale: Good Sitting balance - Comments: no LOB in sitting   Standing balance support: Bilateral upper extremity supported Standing balance-Leahy Scale: Fair Standing balance comment: no LOB with BUE support       Cognition Arousal/Alertness: Awake/alert Behavior During Therapy: WFL for tasks assessed/performed Overall Cognitive Status: History of cognitive impairments - at baseline        General Comments: Pt was A and O x 4.Very cooperative and pleasant throughout. motivated to return home.  Pertinent Vitals/Pain Pain Assessment: No/denies pain           PT Goals (current goals can now be found in the care plan section) Acute Rehab PT Goals Patient Stated Goal: to go home Progress towards PT goals: Progressing toward goals    Frequency    7X/week      PT Plan Current plan remains appropriate       AM-PAC PT "6 Clicks" Mobility   Outcome Measure  Help needed turning from your back to your  side while in a flat bed without using bedrails?: A Little Help needed moving from lying on your back to sitting on the side of a flat bed without using bedrails?: A Little Help needed moving to and from a bed to a chair (including a wheelchair)?: A Little Help needed standing up from a chair using your arms (e.g., wheelchair or bedside chair)?: A Little Help needed to walk in hospital room?: A Little Help needed climbing 3-5 steps with a railing? : A Little 6 Click Score: 18    End of Session Equipment Utilized During Treatment: Gait belt;Oxygen Activity Tolerance: Patient tolerated treatment well Patient left: in chair;with call bell/phone within reach;with chair alarm set;with family/visitor present Nurse Communication: Mobility status PT Visit Diagnosis: Muscle weakness (generalized) (M62.81);Difficulty in walking, not elsewhere classified (R26.2)     Time: 2353-6144 PT Time Calculation (min) (ACUTE ONLY): 28 min  Charges:  $Gait Training: 8-22 mins $Therapeutic Activity: 8-22 mins                     Julaine Fusi PTA 07/23/20, 12:40 PM

## 2020-07-23 NOTE — Progress Notes (Signed)
Nutrition Follow-up  DOCUMENTATION CODES:   Obesity unspecified  INTERVENTION:  Continue Ensure Enlive po BID, each supplement provides 350 kcal and 20 grams of protein   NUTRITION DIAGNOSIS:   Inadequate oral intake related to decreased appetite as evidenced by per patient/family report. -ongoing  GOAL:   Patient will meet greater than or equal to 90% of their needs -meeting with TF  MONITOR:   PO intake, Supplement acceptance, Labs, Weight trends, I & O's  REASON FOR ASSESSMENT:   Malnutrition Screening Tool    ASSESSMENT:   86 year old female with PMHx of CAD, HTN, anemia, COPD, hx MI, anxiety, GERD, arthritis admitted with right occipital lobe infarct, acute on chronic blood loss anemia.  Patient continues to require supplemental oxygen with mild fluid overload. Renal function stable, plans to continue IV diuresis for now. PT working with pt this afternoon at RD follow-up attempt. She has been eating 60-100% (80% average) of meals per flowsheets and drinking Ensure supplement twice daily. Will continue daily supplements to aid with meeting calorie/protein needs.   Admit wt 80.1 kg         Current wt 77.3 kg  Net -5.3 L  Medications reviewed and include: Protonix, Miralax, Senokot, B12  Labs reviewed   Diet Order:   Diet Order            Diet Heart Room service appropriate? Yes; Fluid consistency: Thin  Diet effective now                 EDUCATION NEEDS:   No education needs have been identified at this time  Skin:  Skin Assessment: Reviewed RN Assessment  Last BM:  9/27-type 4  Height:   Ht Readings from Last 1 Encounters:  07/12/20 4\' 11"  (1.499 m)    Weight:   Wt Readings from Last 1 Encounters:  07/21/20 77.3 kg    BMI:  Body mass index is 34.42 kg/m.  Estimated Nutritional Needs:   Kcal:  1700-1900  Protein:  85-95 grams  Fluid:  1.7-1.9 L/day   Lajuan Lines, RD, LDN Clinical Nutrition After Hours/Weekend Pager # in  [place]

## 2020-07-24 ENCOUNTER — Inpatient Hospital Stay: Payer: Medicare Other

## 2020-07-24 LAB — CBC WITH DIFFERENTIAL/PLATELET
Abs Immature Granulocytes: 0.02 10*3/uL (ref 0.00–0.07)
Basophils Absolute: 0.1 10*3/uL (ref 0.0–0.1)
Basophils Relative: 1 %
Eosinophils Absolute: 0.2 10*3/uL (ref 0.0–0.5)
Eosinophils Relative: 3 %
HCT: 30.8 % — ABNORMAL LOW (ref 36.0–46.0)
Hemoglobin: 8.6 g/dL — ABNORMAL LOW (ref 12.0–15.0)
Immature Granulocytes: 0 %
Lymphocytes Relative: 17 %
Lymphs Abs: 1.3 10*3/uL (ref 0.7–4.0)
MCH: 21.6 pg — ABNORMAL LOW (ref 26.0–34.0)
MCHC: 27.9 g/dL — ABNORMAL LOW (ref 30.0–36.0)
MCV: 77.4 fL — ABNORMAL LOW (ref 80.0–100.0)
Monocytes Absolute: 0.8 10*3/uL (ref 0.1–1.0)
Monocytes Relative: 10 %
Neutro Abs: 5 10*3/uL (ref 1.7–7.7)
Neutrophils Relative %: 69 %
Platelets: 292 10*3/uL (ref 150–400)
RBC: 3.98 MIL/uL (ref 3.87–5.11)
RDW: 29.2 % — ABNORMAL HIGH (ref 11.5–15.5)
WBC: 7.3 10*3/uL (ref 4.0–10.5)
nRBC: 0 % (ref 0.0–0.2)

## 2020-07-24 LAB — COMPREHENSIVE METABOLIC PANEL
ALT: 13 U/L (ref 0–44)
AST: 19 U/L (ref 15–41)
Albumin: 3.2 g/dL — ABNORMAL LOW (ref 3.5–5.0)
Alkaline Phosphatase: 52 U/L (ref 38–126)
Anion gap: 11 (ref 5–15)
BUN: 22 mg/dL (ref 8–23)
CO2: 31 mmol/L (ref 22–32)
Calcium: 9 mg/dL (ref 8.9–10.3)
Chloride: 95 mmol/L — ABNORMAL LOW (ref 98–111)
Creatinine, Ser: 0.74 mg/dL (ref 0.44–1.00)
GFR calc Af Amer: >60 mL/min (ref >60)
GFR calc non Af Amer: >60 mL/min (ref >60)
Glucose, Bld: 105 mg/dL — ABNORMAL HIGH (ref 70–99)
Potassium: 3.7 mmol/L (ref 3.5–5.1)
Sodium: 137 mmol/L (ref 135–145)
Total Bilirubin: 0.8 mg/dL (ref 0.3–1.2)
Total Protein: 5.7 g/dL — ABNORMAL LOW (ref 6.5–8.1)

## 2020-07-24 LAB — MAGNESIUM: Magnesium: 2.3 mg/dL (ref 1.7–2.4)

## 2020-07-24 LAB — PHOSPHORUS: Phosphorus: 3.5 mg/dL (ref 2.5–4.6)

## 2020-07-24 MED ORDER — STROKE: EARLY STAGES OF RECOVERY BOOK: 1.0000 | Freq: Once | 0 refills | Status: AC

## 2020-07-24 MED ORDER — ASPIRIN 81 MG PO TBEC: 81.0000 mg | DELAYED_RELEASE_TABLET | Freq: Every day | ORAL | 11 refills | Status: DC

## 2020-07-24 MED ORDER — PANTOPRAZOLE SODIUM 40 MG PO TBEC: 40.0000 mg | DELAYED_RELEASE_TABLET | Freq: Two times a day (BID) | ORAL | 2 refills | Status: DC

## 2020-07-24 MED ORDER — SENNOSIDES-DOCUSATE SODIUM 8.6-50 MG PO TABS: 1.0000 | ORAL_TABLET | Freq: Two times a day (BID) | ORAL | 0 refills | Status: AC

## 2020-07-24 MED ORDER — PANTOPRAZOLE SODIUM 40 MG PO TBEC: 40.0000 mg | DELAYED_RELEASE_TABLET | Freq: Two times a day (BID) | ORAL | 0 refills | Status: DC

## 2020-07-24 MED ORDER — POLYETHYLENE GLYCOL 3350 17 G PO PACK: 17.0000 g | PACK | Freq: Every day | ORAL | 0 refills | Status: DC

## 2020-07-24 MED ORDER — METOPROLOL SUCCINATE ER 25 MG PO TB24: 75.0000 mg | ORAL_TABLET | Freq: Every day | ORAL | 1 refills | Status: DC

## 2020-07-24 MED ORDER — SPIRONOLACTONE 25 MG PO TABS: 25.0000 mg | ORAL_TABLET | Freq: Every day | ORAL | 0 refills | Status: DC

## 2020-07-24 NOTE — Progress Notes (Signed)
Pt had 8 beat v tach and is asymptomatic and went back into SR, will continue to monitor.

## 2020-07-24 NOTE — Progress Notes (Signed)
Ashley Antigua, MD [address], [place], [place], [place], [address] [address], [place], [place], [place], [address] Phone: [phone]  Fax: [phone]   Subjective: Patient has not had any signs of active GI bleeding recently.  Tolerating oral diet.  Hemoglobin improving.  Is continuing to be treated for volume overload   Objective: Exam: Vital signs in last 24 hours: Vitals:   07/24/20 0500 07/24/20 0734 07/24/20 0928 07/24/20 1109  BP:  (!) 155/67    Pulse:  90    Resp:  16    Temp:  98.4 F (36.9 C)    TempSrc:  Oral    SpO2:  98% 96%   Weight: 72.7 kg   71.9 kg  Height:       Weight change:   Intake/Output Summary (Last 24 hours) at 07/24/2020 1132 Last data filed at 07/24/2020 1014 Gross per 24 hour  Intake 360 ml  Output 400 ml  Net -40 ml    General: No acute distress, AAO x3 Abd: Soft, NT/ND, No HSM Skin: Warm, no rashes Neck: Supple, Trachea midline   Lab Results: Lab Results  Component Value Date   WBC 7.3 07/24/2020   HGB 8.6 (L) 07/24/2020   HCT 30.8 (L) 07/24/2020   MCV 77.4 (L) 07/24/2020   PLT 292 07/24/2020   Micro Results: No results found for this or any previous visit (from the past 240 hour(s)). Studies/Results: DG Chest 1 View  Result Date: 07/24/2020 CLINICAL DATA:  Shortness of breath EXAM: CHEST  1 VIEW COMPARISON:  July 23, 2020 FINDINGS: There is ill-defined opacity in the lateral left base region, probably due to partially loculated pleural effusion. There is no appreciable edema or airspace opacity. There is cardiomegaly. The pulmonary vascularity is within normal limits. There is aortic atherosclerosis. No adenopathy. No bone lesions. IMPRESSION: Apparent fairly small loculated pleural effusion lateral left base. No edema or airspace opacity. Stable cardiomegaly. Aortic Atherosclerosis (ICD10-I70.0). Electronically Signed   By: Lowella Grip III M.D.   On: 07/24/2020 08:06   DG Chest 1 View  Result  Date: 07/23/2020 CLINICAL DATA:  Shortness of breath. Altered mental status for 2 days. EXAM: CHEST  1 VIEW COMPARISON:  07/22/2020 FINDINGS: Shallow inspiration. Cardiac enlargement. Mild vascular congestion. No edema or consolidation. Small left pleural effusion is less prominent than on prior study. No pneumothorax. Calcification of the aorta. IMPRESSION: Cardiac enlargement with mild vascular congestion and small left pleural effusion. Improvement since prior study. No edema or consolidation. Electronically Signed   By: Lucienne Capers M.D.   On: 07/23/2020 06:30   Medications:  Scheduled Meds: .  stroke: mapping our early stages of recovery book   Does not apply Once  . artificial tears   Both Eyes Q8H  . aspirin EC  81 mg Oral Daily  . feeding supplement (ENSURE ENLIVE)  237 mL Oral BID BM  . ipratropium  0.5 mg Nebulization BID  . isosorbide dinitrate  30 mg Oral BID  . levalbuterol  0.63 mg Nebulization BID  . metoprolol succinate  75 mg Oral Daily  . pantoprazole (PROTONIX) IV  40 mg Intravenous Q12H  . polyethylene glycol  17 g Oral Daily  . senna-docusate  1 tablet Oral BID  . simvastatin  40 mg Oral Daily  . spironolactone  25 mg Oral Daily  . vitamin B-12  1,000 mcg Oral Daily   Continuous Infusions: PRN Meds:.acetaminophen **OR** acetaminophen (TYLENOL) oral liquid 160 mg/5 mL **OR** acetaminophen, ALPRAZolam, gadobutrol,  HYDROcodone-acetaminophen, ipratropium, levalbuterol, sodium chloride flush   Assessment: Principal Problem:   Acute CVA (cerebrovascular accident) ([place]) Active Problems:   Coronary artery disease   Benign hypertension   Acute on chronic blood loss anemia   Severe Symptomatic anemia   Acute metabolic encephalopathy   COPD (chronic obstructive pulmonary disease) ([facility])   Stroke (cerebrum) ([facility])    Plan: Given patient's recent CVA, and volume overload, currently requiring diuresis, patient remains at high risk for endoscopic procedures  Continue  monitoring CBC Transfuse as needed Please page GI with any signs of active GI bleeding, and risks of benefits of procedure at that time will need to be reassessed  Continue IV iron replacement as necessary  Possible endoscopy as an outpatient after patient is further out from recent acute CVA, after reassessing risks and benefits of procedure at that time again.  Patient should follow up closely in GI clinic on discharge   LOS: 11 days   Ashley Antigua, MD 07/24/2020, 11:32 AM

## 2020-07-24 NOTE — Discharge Summary (Signed)
Ashley [name] WIO:[id number] DOB: 09/02/1931 DOA: 07/12/2020  PCP: Albina Billet, MD  Admit date: 07/12/2020 Discharge date: 07/24/2020  Admitted From: Home Disposition:  Home  Recommendations for Outpatient Follow-up:  1. Follow up with PCP in 1 week 2. Please obtain BMP/CBC in one week 3. Follow up with vascular surgery, neurosurgery, cardiology, GI in one week  Home Health:yes    Discharge Condition:Stable CODE STATUS:DNR  Diet recommendation: Heart Healthy  Brief/Interim Summary: Per h/p: Ashley [name] is a 86 y.o. female with medical history significant for CAD, COPD, HTN who presents to the emergency room with altered mental status x2 days.  Most of the history was taken from the son who stated that he noticed that his mother was lethargic and not her usual self and appeared to not recognize him and his symptoms worsened  prompting him to bring her into the emergency room.  He states that she had been complaining of weakness for the past 2 weeks which she attributed to her age.  She saw her PCP 3 weeks prior and had blood work done and also recently saw her cardiologist.  He also stated that she had been having hip pain and has been seeing the orthopedist and taking Tylenol but no NSAIDs for her pain.  On arrival in the emergency room she was mildly tachypneic with otherwise normal vitals.  Blood work notable for hemoglobin of 4.7.  Last hemoglobin on file 10.7 from a year prior.  Stool guaiac positive.  Covid negative chest x-ray with no acute findings.  Head CT showed right occipital lobe hypodensity consistent with acute or subacute infarct.  Blood transfusion started in the ER.  Patient was admitted to the hospital.Please note I saw the patient for the first time today.  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  Acute/subacuteCVA (cerebrovascular accident) ([facility]), in the setting of severe anemia Intracranial aneurysms/largest 10 mm Bilateral carotid stenosis Patient appears to be back at baseline per son who is at bedside today. -Symptom onset 48 hours prior to arrival, outside TPA window and not a candidate either way due to positive stool guaiac and severe anemia -Echocardiogram:EF of 55 to 60%.  -Cardiac monitoring to evaluate for arrhythmias-negative for any dysrhythmias -MRI/MRA -consistent with aneurysms-please see results below  -Bilateral carotid study, right 70% stenosis, left 60-65% stenosis Neurology was consulted.  Recommended anticoagulation with aspirin follow-up as outpatient with neurosurgery regarding the largest 10 mm ICA aneurysm. Pt was started on aspirin eventually when Hg remained stable.    Acute Respiratory Failure with Hypoxia in the setting of acute COPD versus concomitant Acute on Chronic Diastolic CHF, improving slightly  COPD (chronic obstructive pulmonary disease) (HCC)-mild exacerbation Acute on Chronic Diastolic CHF -Improved and is not in respiratory distress. Walked on RA with PT 02 sat 95%. -Mild COPD exacerbation versus volume overload in the setting of diastolic CHF exacerbation Was diuresed and lost weight.   Acute onchronic Blood loss Anemia/ Severe Symptomatic Anemia -Status post 2 units of PRBC.  -On admission hemoglobin 4.7 ---2U PRBC transfused  Was Guaiac positive. GI consulted-felt patient is at high risk for endoscopic procedures given recent CVA and CHF. Her hemoglobin remained stable.  No signs of active bleeding. Possible endoscopy as outpatient after patient is further out from recent acute CVA,  after reassessing risk and benefits of procedure at that time again. Will need to follow-up with GI clinic on discharge Was given IV iron GI [employer] likely from oral iron.   Acute  Metabolic Encephalopathy Resolved.     Coronary Artery Disease On asa and statin, and beta blk Had 8beats of VTach per nsg note, I was not notified with this. Cards saw pt and cleared pt to be discharged and f/u with Dr. Ubaldo Glassing within next 2 weeks  Constipation resolved  Hyperlipidemia Continue statin as outpt  Hypokalemia- replaced and resolved   Benign Hypertension-stable .     Discharge Diagnoses:  Principal Problem:   Acute CVA (cerebrovascular accident) [facility]) Active Problems:   Coronary artery disease   Benign hypertension   Acute on chronic blood loss anemia   Severe Symptomatic anemia   Acute metabolic encephalopathy   COPD (chronic obstructive pulmonary disease) ([place])   Stroke (cerebrum) [facility])    Discharge Instructions  Discharge Instructions    Call MD for:  persistant dizziness or light-headedness   Complete by: As directed    Diet - low sodium heart healthy   Complete by: As directed    Increase activity slowly   Complete by: As directed      Allergies as of 07/24/2020   No Known Allergies     Medication List    STOP taking these medications   lisinopril 10 MG tablet Commonly known as: ZESTRIL     TAKE these medications    stroke: mapping our early stages of recovery book Misc 1 each by Does not apply route once for 1 dose.   ALPRAZolam 0.25 MG tablet Commonly known as: XANAX Take 0.25 mg by mouth 2 (two) times daily.   amLODipine 10 MG tablet Commonly known as: NORVASC Take 10 mg by mouth daily.   aspirin 81 MG EC tablet Take 1 tablet (81 mg total) by mouth daily. Swallow whole. Start taking on: July 25, 2020   Combivent Respimat 20-100 MCG/ACT Aers respimat Generic drug: Ipratropium-Albuterol Inhale 1 puff into the lungs every 6 (six) hours.   isosorbide dinitrate 30 MG tablet Commonly known as: ISORDIL Take 30 mg by mouth 2 (two) times daily.   metoprolol succinate 25 MG 24 hr tablet Commonly known as:  TOPROL-XL Take 3 tablets (75 mg total) by mouth daily. Take with or immediately following a meal. Start taking on: July 25, 2020 What changed: medication strength   nitroGLYCERIN 0.4 MG SL tablet Commonly known as: NITROSTAT Place 1 tablet (0.4 mg total) under the tongue every 5 (five) minutes as needed for chest pain.   pantoprazole 40 MG tablet Commonly known as: Protonix Take 1 tablet (40 mg total) by mouth 2 (two) times daily.   pantoprazole 40 MG tablet Commonly known as: Protonix Take 1 tablet (40 mg total) by mouth 2 (two) times daily.   polyethylene glycol 17 g packet Commonly known as: MIRALAX / GLYCOLAX Take 17 g by mouth daily. Start taking on: July 25, 2020   senna-docusate 8.6-50 MG tablet Commonly known as: Senokot-S Take 1 tablet by mouth 2 (two) times daily for 5 days.   simvastatin 40 MG tablet Commonly known as: ZOCOR Take 40 mg by mouth daily.   spironolactone 25 MG tablet Commonly known as: ALDACTONE Take 1 tablet (25 mg total) by mouth daily. Start taking on: July 25, 2020   vitamin B-12 1000 MCG tablet Commonly known as: CYANOCOBALAMIN Take 1,000 mcg by mouth daily.  Durable Medical Equipment  (From admission, onward)         Start     Ordered   07/24/20 1630  For home use only DME Shower stool  Once        07/24/20 1630   07/24/20 1629  For home use only DME Bedside commode  Once       Comments: Needs tub /shower seat  Question:  Patient needs a bedside commode to treat with the following condition  Answer:  Weakness   07/24/20 1630          Follow-up Information    Virgel Manifold, MD In 1 week.   Specialty: Gastroenterology Why: OFFICE TO CALL Contact information: [place] [address] [place], Dolores Lory, MD On 08/05/2020.   Specialties: Vascular Surgery, Cardiology, Radiology, Vascular Surgery Why: AT 3PM Contact information: [place] [address] [phone]        Meade Maw, MD Follow up in 1 week(s).   Specialty: Neurosurgery Why: brain aneurysm Contact information: [place] [address] [place], [place], NP On 08/12/2020.   Specialty: Neurology Why: [id number] Contact information: [place] [address] [phone]        Teodoro Spray, MD Follow up in 1 week(s).   Specialty: Cardiology Contact information: [place] [address] [phone]              No Known Allergies  Consultations: Cardiology, neurology, GI  Procedures/Studies: CT ANGIO HEAD W OR WO CONTRAST  Result Date: 07/14/2020 CLINICAL DATA:  Stroke EXAM: CT ANGIOGRAPHY HEAD AND NECK TECHNIQUE: Multidetector CT imaging of the head and neck was performed using the standard protocol during bolus administration of intravenous contrast. Multiplanar CT image reconstructions and MIPs were obtained to evaluate the vascular anatomy. Carotid stenosis measurements (when applicable) are obtained utilizing NASCET criteria, using the distal internal carotid diameter as the denominator. CONTRAST:  59mL OMNIPAQUE IOHEXOL 350 MG/ML SOLN COMPARISON:  CT head and MRI head 07/13/2020 FINDINGS: CT HEAD FINDINGS Brain: Hypodensity throughout most of the right occipital lobe unchanged from recent CT and MRI compatible with acute right PCA infarct. No associated hemorrhage. Patchy white matter hypodensity bilaterally consistent with chronic microvascular ischemia of a moderate degree. Ventricle size normal. Negative for mass lesion. Vascular: Negative for hyperdense vessel Skull: Negative Sinuses: Paranasal sinuses clear. Orbits: Bilateral cataract extraction.  No orbital mass. Review of the MIP images confirms the above findings CTA NECK FINDINGS Aortic arch: Atherosclerotic calcification aortic arch and proximal great vessels without stenosis or  aneurysm. Tortuosity of the proximal great vessels noted. Right carotid system: Atherosclerotic disease right carotid bifurcation extending into the proximal right internal carotid artery. 40% diameter stenosis proximal right internal carotid artery. Left carotid system: Atherosclerotic calcification left carotid bifurcation without significant stenosis. Vertebral arteries: Moderate calcific stenosis at the origin of the right vertebral artery. Right vertebral artery is patent to the basilar without additional stenosis. Non dominant left vertebral artery without significant stenosis. Skeleton: No acute abnormality. Other neck: Negative for mass or adenopathy. Upper chest: Moderate right pleural effusion and small left effusion. Negative for mass or pneumonia in the lung apices. Review of the MIP images confirms the above findings CTA HEAD FINDINGS Anterior circulation: Atherosclerotic calcification throughout the cavernous carotid bilaterally without significant stenosis. 6 x 9 mm aneurysm of the  right cavernous carotid proximal to the anterior genu. This projects medially. 3 mm aneurysm in the anterior genu of the left cavernous carotid in the ophthalmic artery region. Possible small blister aneurysm of the left internal carotid artery projecting medially as noted on MRA. Anterior and middle cerebral arteries patent bilaterally without significant stenosis. Posterior circulation: Both vertebral arteries patent to the basilar. Right vertebral artery dominant. PICA patent bilaterally. Basilar widely patent. AICA, superior cerebellar, and posterior cerebral arteries patent bilaterally without stenosis. Right PCA widely patent in the setting of acute right PCA infarct. Venous sinuses: Normal venous enhancement. Anatomic variants: None Review of the MIP images confirms the above findings IMPRESSION: 1. Acute infarct right PCA territory unchanged. Negative for hemorrhage. Right PCA is patent. 2. Atherosclerotic disease  right carotid bifurcation with 40% diameter stenosis proximal right internal carotid artery. Left carotid patent without significant stenosis 3. Moderate stenosis due to atherosclerotic calcification origin of right vertebral artery. Left vertebral artery patent without significant stenosis. 4. Bilateral cavernous carotid aneurysms as above. Electronically Signed   By: Franchot Gallo M.D.   On: 07/14/2020 11:46   DG Chest 1 View  Result Date: 07/24/2020 CLINICAL DATA:  Shortness of breath EXAM: CHEST  1 VIEW COMPARISON:  July 23, 2020 FINDINGS: There is ill-defined opacity in the lateral left base region, probably due to partially loculated pleural effusion. There is no appreciable edema or airspace opacity. There is cardiomegaly. The pulmonary vascularity is within normal limits. There is aortic atherosclerosis. No adenopathy. No bone lesions. IMPRESSION: Apparent fairly small loculated pleural effusion lateral left base. No edema or airspace opacity. Stable cardiomegaly. Aortic Atherosclerosis (ICD10-I70.0). Electronically Signed   By: Lowella Grip III M.D.   On: 07/24/2020 08:06   DG Chest 1 View  Result Date: 07/23/2020 CLINICAL DATA:  Shortness of breath. Altered mental status for 2 days. EXAM: CHEST  1 VIEW COMPARISON:  07/22/2020 FINDINGS: Shallow inspiration. Cardiac enlargement. Mild vascular congestion. No edema or consolidation. Small left pleural effusion is less prominent than on prior study. No pneumothorax. Calcification of the aorta. IMPRESSION: Cardiac enlargement with mild vascular congestion and small left pleural effusion. Improvement since prior study. No edema or consolidation. Electronically Signed   By: Lucienne Capers M.D.   On: 07/23/2020 06:30   DG Chest 1 View  Result Date: 07/22/2020 CLINICAL DATA:  Shortness of breath. EXAM: CHEST  1 VIEW COMPARISON:  07/21/2020. FINDINGS: Cardiomegaly, pulmonary venous congestion, bilateral interstitial prominence, and small left  pleural effusion again noted without interim change. Findings most consistent with persistent CHF. Pneumonitis cannot be excluded. Degenerative changes scoliosis thoracic spine. IMPRESSION: Cardiomegaly, pulmonary venous congestion, bilateral interstitial prominence, and small left pleural effusion again noted without interim change. Findings most consistent with persistent CHF. Pneumonitis cannot be excluded. Electronically Signed   By: Marcello Moores  Register   On: 07/22/2020 05:38   DG Chest 1 View  Result Date: 07/21/2020 CLINICAL DATA:  Shortness of breath EXAM: CHEST  1 VIEW COMPARISON:  07/20/2020 FINDINGS: Cardiomegaly and large hiatal hernia again noted. Pulmonary vascular congestion and small bilateral pleural effusions, LEFT greater than RIGHT, and possible mild interstitial edema again noted. There is no evidence of pneumothorax. IMPRESSION: Little significant change with cardiomegaly, pulmonary vascular congestion, small bilateral pleural effusions and possible mild interstitial edema again noted. Electronically Signed   By: Margarette Canada M.D.   On: 07/21/2020 07:28   DG Chest 1 View  Result Date: 07/20/2020 CLINICAL DATA:  Shortness of breath EXAM: CHEST  1 VIEW  COMPARISON:  July 19, 2020 FINDINGS: Enlarged cardiac silhouette. Calcific atherosclerotic disease of the aorta. Large hiatal hernia. Bilateral small pleural effusions. Left lower lobe atelectasis versus airspace consolidation. Mild interstitial pulmonary edema. Osseous structures are without acute abnormality. Soft tissues are grossly normal. IMPRESSION: 1. Enlarged cardiac silhouette. 2. Mild interstitial pulmonary edema. 3. Bilateral small pleural effusions. 4. Left lower lobe atelectasis versus airspace consolidation. 5. Large hiatal hernia. Electronically Signed   By: Fidela Salisbury M.D.   On: 07/20/2020 12:42   DG Chest 1 View  Result Date: 07/19/2020 CLINICAL DATA:  Shortness of breath EXAM: CHEST  1 VIEW COMPARISON:   07/19/2020 FINDINGS: There is cardiomegaly. There are small to moderate-sized bilateral pleural effusions. There is vascular congestion without overt pulmonary edema. There are atherosclerotic changes of the thoracic aorta. There is no pneumothorax. No acute osseous abnormality. IMPRESSION: Congestive heart failure, similar to prior study. Electronically Signed   By: Constance Holster M.D.   On: 07/19/2020 19:49   DG Chest 1 View  Result Date: 07/19/2020 CLINICAL DATA:  Shortness of breath EXAM: CHEST  1 VIEW COMPARISON:  July 18, 2020 FINDINGS: There is a stable appearing left pleural effusion. Small right pleural effusion appears smaller compared to 1 day prior. There is a degree of interstitial pulmonary edema, stable. There is atelectatic change in the left base. There is cardiomegaly with pulmonary venous hypertension. No adenopathy. There is aortic atherosclerosis. No bone lesions. IMPRESSION: Cardiomegaly with pulmonary vascular congestion. Interstitial edema and pleural effusions. Overall appearance felt to be indicative of a degree of congestive heart failure. Left lower lobe atelectatic changes stable. Aortic Atherosclerosis (ICD10-I70.0). Electronically Signed   By: Lowella Grip III M.D.   On: 07/19/2020 07:55   DG Chest 1 View  Result Date: 07/18/2020 CLINICAL DATA:  Dyspnea EXAM: CHEST  1 VIEW COMPARISON:  07/15/2020 FINDINGS: Small to moderate bilateral pleural effusions are again identified, left greater than right, with associated bibasilar atelectasis. Superimposed interstitial pulmonary edema persists, likely cardiogenic in nature, slightly improved since prior examination. No pneumothorax. Mild to moderate cardiomegaly is unchanged. IMPRESSION: Slight interval improvement in cardiogenic failure. Persistent small to moderate bilateral pleural effusions. Electronically Signed   By: Fidela Salisbury MD   On: 07/18/2020 04:22   CT Head Wo Contrast  Result Date: 07/13/2020 CLINICAL  DATA:  Altered level of consciousness EXAM: CT HEAD WITHOUT CONTRAST TECHNIQUE: Contiguous axial images were obtained from the base of the skull through the vertex without intravenous contrast. COMPARISON:  None. FINDINGS: Brain: Hypodensity within the right occipital lobe consistent with acute or subacute infarct. Effacement of the overlying sulci and occipital horn of the right lateral ventricle. No evidence of hemorrhagic conversion. Scattered hypodensities elsewhere throughout the periventricular and subcortical white matter likely reflect chronic small vessel ischemic change. No acute hemorrhage. Lateral ventricles and midline structures are otherwise unremarkable. No acute extra-axial fluid collections. No mass effect. Vascular: No hyperdense vessel or unexpected calcification. Skull: Normal. Negative for fracture or focal lesion. Sinuses/Orbits: No acute finding. Other: None. IMPRESSION: 1. Right occipital lobe hypodensity consistent with acute or subacute infarct. 2. Likely chronic small vessel ischemic changes scattered throughout the white matter. 3. No acute hemorrhage. Critical Value/emergent results were called by telephone at the time of interpretation on 07/13/2020 at 3:16 am to provider [facility] , who verbally acknowledged these results. Electronically Signed   By: Randa Ngo M.D.   On: 07/13/2020 03:21   CT ANGIO NECK W OR WO CONTRAST  Result Date: 07/14/2020 :  CT angiogram neck reported under the CT angio head report and accession number Electronically Signed   By: Franchot Gallo M.D.   On: 07/14/2020 11:53   MR ANGIO HEAD WO CONTRAST  Result Date: 07/13/2020 CLINICAL DATA:  86 year old female with altered mental status. Increasing confusion x3 days. Acute to subacute appearing right PCA territory infarct on plain head CT earlier today. History of GI bleeding, and substantially low hemoglobin and hematocrit levels on presentation. Being transfused. EXAM: MRA HEAD WITHOUT CONTRAST  TECHNIQUE: Angiographic images of the Circle of Willis were obtained using MRA technique without intravenous contrast. COMPARISON:  Neck MRA today reported separately. FINDINGS: Antegrade flow in the posterior circulation with dominant distal right vertebral artery. No distal vertebral stenosis. Patent left PICA origin. The right AICA appears to be dominant. No right PICA identified. Patent vertebrobasilar junction. Patent basilar artery without stenosis. Patent SCA and PCA origins, and bilateral PCA branches appear symmetric and within normal limits. Antegrade flow in both ICA siphons. Both cavernous ICA segments appear abnormal, with a large roughly 10 mm saccular versus fusiform aneurysm of the cavernous right ICA directed medially. And there is a much smaller 3 mm saccular aneurysm of the cavernous left ICA directed superiorly and medially. See series 1, images 96 and 100. furthermore, there is evidence of a second small distal left ICA aneurysm, in the form of a 3-4 mm aneurysm directed medially from the left anterior genu (intradural). But no superimposed ICA stenosis is identified in both carotid termini are patent. Ophthalmic artery origins appear within normal limits. Posterior communicating arteries are diminutive or absent. Patent MCA and ACA origins. Anterior communicating artery and visible ACA branches are within normal limits. MCA M1 segments and bifurcations are patent without evidence of stenosis. Visible bilateral MCA branches are within normal limits. IMPRESSION: 1. Positive for multiple ICA aneurysms: - large, roughly 10 mm aneurysm of the cavernous Right ICA directed medially. - 4 mm Left ICA anterior genu aneurysm (intradural). - 3 mm cavernous Left ICA aneurysm. 2. Negative for large vessel occlusion or intracranial stenosis, the Right PCA is within normal limits. 3. Considering both the intracranial and extracranial MRA findings, a follow-up CTA Head and Neck with IV contrast is recommended  when feasible. Electronically Signed   By: Genevie Ann M.D.   On: 07/13/2020 18:18   MR ANGIO NECK WO CONTRAST  Result Date: 07/13/2020 CLINICAL DATA:  86 year old female with altered mental status. Increasing confusion x3 days. Acute to subacute appearing right PCA territory infarct on plain head CT earlier today. History of GI bleeding, and substantially low hemoglobin and hematocrit levels on presentation. Being transfused. A study without and with contrast was planned but the patient's IV access infiltrated. EXAM: MRA NECK WITHOUT CONTRAST TECHNIQUE: Angiographic images of the neck were obtained using MRA technique without intravenous contrast. Carotid stenosis measurements (when applicable) are obtained utilizing NASCET criteria, using the distal internal carotid diameter as the denominator. COMPARISON:  Brain MRI today. Carotid Doppler ultrasound earlier today. FINDINGS: Time-of-flight MRA images demonstrate antegrade flow in both cervical carotid and vertebral arteries to the skull base. Dominant appearing right vertebral artery, no evidence of vertebral artery stenosis in the visible V1 or V2 segments. Both carotid bifurcations also are patent, but there are no flow gaps to suggest a high-grade cervical ICA stenosis. However, there is irregularity and narrowing of the proximal right ICA (series 6, image 73), roughly estimated at 50 % with respect to the distal vessel on these images. Mild left ICA  distal bulb irregularity without that degree of stenosis. IMPRESSION: Evidence of right greater than left ICA atherosclerosis, but time-of-flight MRA does NOT confirm a high-grade carotid stenosis in the neck. Follow-up CTA neck with IV contrast would best evaluate further as necessary. Electronically Signed   By: Genevie Ann M.D.   On: 07/13/2020 18:11   MR BRAIN WO CONTRAST  Result Date: 07/13/2020 CLINICAL DATA:  86 year old female with altered mental status. Increasing confusion x3 days. Acute to subacute  appearing right PCA territory infarct on plain head CT earlier today. History of GI bleeding, and substantially low hemoglobin and hematocrit levels on presentation. Being transfused. EXAM: MRI HEAD WITHOUT CONTRAST TECHNIQUE: Multiplanar, multiecho pulse sequences of the brain and surrounding structures were obtained without intravenous contrast. COMPARISON:  Head CT 0224 hours today. FINDINGS: Brain: Confluent restricted diffusion throughout the right occipital lobe, posterior right temporal lobe including the right hippocampal formation. T2 and FLAIR hyperintense cytotoxic edema in the affected areas. Minimal T1 hypointensity. The right thalamus is spared. No acute intracranial hemorrhage identified. No significant intracranial mass effect. Additionally there is a small focus of patchy restricted diffusion in the right inferior cerebellum, PICA territory (series 9, image 10) with minimal T2 and FLAIR hyperintensity. No mass effect. No other restricted diffusion. Widely scattered moderate for age bilateral cerebral white matter T2 and FLAIR hyperintensity. But no chronic cortical encephalomalacia or chronic cerebral blood products are identified. Mild T2 heterogeneity in the deep gray nuclei, primarily the left thalamus. No midline shift, mass effect, evidence of mass lesion, ventriculomegaly, extra-axial collection or acute intracranial hemorrhage. Cervicomedullary junction and pituitary are within normal limits. Vascular: Major intracranial vascular flow voids are preserved. Mild intracranial artery tortuosity. Skull and upper cervical spine: Negative for age visible cervical spine. Mildly heterogeneous background bone marrow signal, also mildly abnormal on DWI. Sinuses/Orbits: Negative. Other: Scalp and face appear negative. IMPRESSION: 1. Confluent acute Right PCA territory infarct. Subtle acute infarct also in the cerebellum, Right PICA territory. No associated hemorrhage or mass effect. 2. Moderate for age  chronic white matter signal changes likely due to small vessel disease. 3. Heterogeneous bone marrow signal likely reflects red marrow reactivation in the setting of anemia. Electronically Signed   By: Genevie Ann M.D.   On: 07/13/2020 18:06   US Carotid Bilateral (at [facility] and [facility] only)  Result Date: 07/13/2020 CLINICAL DATA:  Stroke. History of previous myocardial infarction and hypertension. EXAM: BILATERAL CAROTID DUPLEX ULTRASOUND TECHNIQUE: Pearline Cables scale imaging, color Doppler and duplex ultrasound were performed of bilateral carotid and vertebral arteries in the neck. COMPARISON:  None. FINDINGS: Criteria: Quantification of carotid stenosis is based on velocity parameters that correlate the residual internal carotid diameter with NASCET-based stenosis levels, using the diameter of the distal internal carotid lumen as the denominator for stenosis measurement. The following velocity measurements were obtained: RIGHT ICA: 267/55 cm/sec CCA: 16/10 cm/sec SYSTOLIC ICA/CCA RATIO:  3.6 ECA: 126 cm/sec LEFT ICA: 177/39 cm/sec CCA: 96/04 cm/sec SYSTOLIC ICA/CCA RATIO:  2.8 ECA: 102 cm/sec RIGHT CAROTID ARTERY: There is a minimal amount of intimal thickening involving the right carotid bulb (image 21). There is a large amount predominantly hypoechoic plaque involving the right internal carotid artery resulting in elevated peak systolic velocities throughout the interrogated course of the right internal carotid artery. Greatest acquired peak systolic velocity within the mid right ICA measures 268 centimeters/second (image 38). RIGHT VERTEBRAL ARTERY:  Antegrade Flow LEFT CAROTID ARTERY: There is a minimal amount intimal thickening/atherosclerotic plaque involving the left carotid  bulb. There is a large amount of echogenic irregular atherosclerotic plaque throughout the left common carotid artery, resulting in elevated peak systolic velocities within the proximal and mid aspects of the left internal carotid artery. Greatest  acquired peak systolic velocity within the mid left ICA measures 177 centimeters/second (image 79). LEFT VERTEBRAL ARTERY:  Antegrade flow IMPRESSION: 1. Large amount of right-sided atherosclerotic plaque results in elevated peak systolic velocities within the right internal carotid artery compatible with the greater than 70% luminal narrowing range. Further evaluation with CTA could be performed as clinically indicated. 2. Large amount of left-sided atherosclerotic plaque results in elevated peak systolic velocities within the left internal carotid artery compatible with the higher end of the 50-69% luminal narrowing range. 3. Antegrade flow demonstrated within the bilateral vertebral arteries. Electronically Signed   By: Sandi Mariscal M.D.   On: 07/13/2020 08:38   DG Chest Port 1 View  Result Date: 07/15/2020 CLINICAL DATA:  86 year old female with history of shortness of breath and weakness for the past 2 weeks. EXAM: PORTABLE CHEST 1 VIEW COMPARISON:  Chest x-ray 07/13/2020. FINDINGS: Low lung volumes. Bibasilar opacities which may reflect areas of atelectasis and/or consolidation. Small bilateral pleural effusions (left greater than right). Pulmonary venous congestion. Mild indistinctness of interstitial markings. Mild cardiomegaly. The patient is rotated to the left on today's exam, resulting in distortion of the mediastinal contours and reduced diagnostic sensitivity and specificity for mediastinal pathology. Aortic atherosclerosis. IMPRESSION: 1. The appearance the chest suggests mild congestive heart failure, with findings similar to the prior study, as above. 2. Aortic atherosclerosis. Electronically Signed   By: Vinnie Langton M.D.   On: 07/15/2020 10:02   DG Chest Portable 1 View  Result Date: 07/13/2020 CLINICAL DATA:  Dyspnea EXAM: PORTABLE CHEST 1 VIEW COMPARISON:  07/15/2018 FINDINGS: Small left pleural effusion. Moderate cardiomegaly. No overt pulmonary edema or focal airspace consolidation.  IMPRESSION: Cardiomegaly and small left pleural effusion. Electronically Signed   By: Ulyses Jarred M.D.   On: 07/13/2020 00:21   ECHOCARDIOGRAM COMPLETE  Result Date: 07/14/2020    ECHOCARDIOGRAM REPORT   Patient Name:   PAYLIN HAILU Date of Exam: 07/13/2020 Medical Rec #:  [id number]      Height:       59.0 in Accession #:    [id number]     Weight:       162.0 lb Date of Birth:  1931/05/27      BSA:          1.686 m Patient Age:    39 years       BP:           148/72 mmHg Patient Gender: F              HR:           109 bpm. Exam Location:  [facility] Procedure: 2D Echo Indications:     SYNCOPE 780.2/R55  History:         Patient has prior history of Echocardiogram examinations, most                  recent 07/13/2018. CAD, Stroke; Risk Factors:Hypertension.  Sonographer:     Avanell Shackleton Referring Phys:  [id number] Athena Masse Diagnosing Phys: Bartholome Bill MD IMPRESSIONS  1. Left ventricular ejection fraction, by estimation, is 55 to 60%. The left ventricle has normal function. The left ventricle has no regional wall motion abnormalities. Left ventricular diastolic parameters are consistent with Grade I diastolic dysfunction (  impaired relaxation).  2. Right ventricular systolic function is normal. The right ventricular size is normal.  3. The mitral valve is grossly normal. Mild mitral valve regurgitation.  4. The aortic valve is grossly normal. Aortic valve regurgitation is not visualized. FINDINGS  Left Ventricle: Left ventricular ejection fraction, by estimation, is 55 to 60%. The left ventricle has normal function. The left ventricle has no regional wall motion abnormalities. The left ventricular internal cavity size was normal in size. There is  borderline left ventricular hypertrophy. Left ventricular diastolic parameters are consistent with Grade I diastolic dysfunction (impaired relaxation). Right Ventricle: The right ventricular size is normal. No increase in right ventricular wall thickness. Right  ventricular systolic function is normal. Left Atrium: Left atrial size was normal in size. Right Atrium: Right atrial size was normal in size. Pericardium: There is no evidence of pericardial effusion. Mitral Valve: The mitral valve is grossly normal. Mild mitral valve regurgitation. Tricuspid Valve: The tricuspid valve is grossly normal. Tricuspid valve regurgitation is mild. Aortic Valve: The aortic valve is grossly normal. Aortic valve regurgitation is not visualized. Pulmonic Valve: The pulmonic valve was not well visualized. Pulmonic valve regurgitation is trivial. Aorta: The aortic root is normal in size and structure. IAS/Shunts: The interatrial septum was not assessed.  LEFT VENTRICLE PLAX 2D LVIDd:         5.07 cm  Diastology LVIDs:         3.11 cm  LV e' medial:    7.72 cm/s LV PW:         1.04 cm  LV E/e' medial:  16.5 LV IVS:        1.02 cm  LV e' lateral:   7.07 cm/s LVOT diam:     2.10 cm  LV E/e' lateral: 18.0 LVOT Area:     3.46 cm  RIGHT VENTRICLE         IVC TAPSE (M-mode): 3.0 cm  IVC diam: 2.02 cm LEFT ATRIUM             Index       RIGHT ATRIUM           Index LA diam:        3.80 cm 2.25 cm/m  RA Area:     18.00 cm LA Vol (A2C):   68.4 ml 40.56 ml/m RA Volume:   46.20 ml  27.40 ml/m LA Vol (A4C):   74.9 ml 44.42 ml/m LA Biplane Vol: 74.5 ml 44.18 ml/m   AORTA Ao Root diam: 3.20 cm MITRAL VALVE                TRICUSPID VALVE MV Area (PHT): 7.09 cm     TR Peak grad:   63.7 mmHg MV Decel Time: 107 msec     TR Vmax:        399.00 cm/s MV E velocity: 127.00 cm/s MV A velocity: 147.00 cm/s  SHUNTS MV E/A ratio:  0.86         Systemic Diam: 2.10 cm Bartholome Bill MD Electronically signed by Bartholome Bill MD Signature Date/Time: 07/14/2020/8:45:55 AM    Final    CT Angio Abd/Pel w/ and/or w/o  Result Date: 07/13/2020 CLINICAL DATA:  Bilateral lower extremity edema.  GI bleed. EXAM: CTA ABDOMEN AND PELVIS WITHOUT AND WITH CONTRAST TECHNIQUE: Multidetector CT imaging of the abdomen and pelvis was  performed using the standard protocol during bolus administration of intravenous contrast. Multiplanar reconstructed images and MIPs were obtained and reviewed to evaluate  the vascular anatomy. CONTRAST:  133mL OMNIPAQUE IOHEXOL 350 MG/ML SOLN, 157mL OMNIPAQUE IOHEXOL 350 MG/ML SOLN COMPARISON:  07/15/2018 FINDINGS: VASCULAR Aorta: Aneurysmal dilatation of the infrarenal abdominal aorta measures 3.1 cm, image 57/4. Aortic atherosclerosis. No signs of dissection, vasculitis or stenosis. Celiac: Calcified plaque noted at the origin of the celiac trunk. No significant stenosis identified. SMA: Calcified plaque identified within the proximal superior mesenteric artery with approximately 60% stenosis. Renals: Mild narrowing at the origin of the left renal artery with less than 15% stenosis. Calcification at the origin of the right renal artery is noted with approximately 15% stenosis. IMA: Patent without evidence of aneurysm, dissection, vasculitis or significant stenosis. Inflow: Patent without evidence of aneurysm, dissection, vasculitis or significant stenosis. Proximal Outflow: Bilateral common femoral and visualized portions of the superficial and profunda femoral arteries are patent without evidence of aneurysm, dissection, vasculitis or significant stenosis. Veins: No obvious venous abnormality within the limitations of this arterial phase study. Review of the MIP images confirms the above findings. NON-VASCULAR Lower chest: Small bilateral pleural effusions with overlying compressive type atelectasis. Hepatobiliary: Low-density structure within posteroinferior right lobe of liver measures 9 mm and is too small to characterize. No suspicious liver abnormality. Previous cholecystectomy. Mild increase caliber of the common bile duct is noted which measures up to 9 mm compatible with post cholecystectomy physiology. Pancreas: Unremarkable. No pancreatic ductal dilatation or surrounding inflammatory changes. Spleen:  Normal in size without focal abnormality. Adrenals/Urinary Tract: Normal appearance of the adrenal glands. Bilateral kidney cysts are identified. The largest arises from the lateral cortex of the right mid kidney measuring 5.2 cm. No signs of hydronephrosis bilaterally. Urinary bladder is unremarkable. Stomach/Bowel: Moderate to large hiatal hernia. There is no bowel wall thickening, inflammation or distension. No signs of contrast extravasation within the bowel loops to indicate site of GI bleed. Extensive sigmoid diverticulosis. No acute inflammation. Lymphatic: No abdominopelvic adenopathy. Reproductive: Status post hysterectomy. No adnexal masses. Other: No free fluid or fluid collections identified. There is a periumbilical hernia which contains fat and a loop of nonobstructive transverse colon. Musculoskeletal: No acute or significant osseous findings. IMPRESSION: 1. No acute findings within the abdomen or pelvis. No signs of contrast extravasation within the bowel loops to indicate site of GI bleed. 2. Aortic atherosclerosis. There is a 3.1 cm infrarenal abdominal aortic aneurysm. Recommend follow-up every 3 years. this recommendation follows ACR consensus guidelines: White Paper of the ACR Incidental Findings Committee II on Vascular Findings. J Am Coll Radiol 2013; 10:789-794. 3. Bilateral kidney cysts. 4. Hiatal hernia. 5. Small bilateral pleural effusions with overlying compressive type atelectasis. 6. Periumbilical hernia contains fat and a loop of nonobstructive transverse colon. Aortic Atherosclerosis (ICD10-I70.0). Aortic aneurysm NOS (ICD10-I71.9). Electronically Signed   By: Kerby Moors M.D.   On: 07/13/2020 06:57      Subjective: Pt sitting in chair, son at bedside.  She is not in distress.  Reports feeling well and responding appropriately.  Son states she is doing much better .  Discharge Exam: Vitals:   07/24/20 0928 07/24/20 1514  BP:  (!) 113/57  Pulse:  88  Resp:  20  Temp:   98 F (36.7 C)  SpO2: 96% 96%   Vitals:   07/24/20 0734 07/24/20 0928 07/24/20 1109 07/24/20 1514  BP: (!) 155/67   (!) 113/57  Pulse: 90   88  Resp: 16   20  Temp: 98.4 F (36.9 C)   98 F (36.7 C)  TempSrc: Oral   Oral  SpO2: 98% 96%  96%  Weight:   71.9 kg   Height:        General: Pt is alert, awake, not in acute distress Cardiovascular: RRR, S1/S2 +, no rubs, no gallops Respiratory: CTA bilaterally, no wheezing, no rhonchi Abdominal: Soft, NT, ND, bowel sounds + Extremities: no edema, no cyanosis    The results of significant diagnostics from this hospitalization (including imaging, microbiology, ancillary and laboratory) are listed below for reference.     Microbiology: No results found for this or any previous visit (from the past 240 hour(s)).   Labs: BNP (last 3 results) Recent Labs    07/18/20 0443  BNP 734.2*   Basic Metabolic Panel: Recent Labs  Lab 07/20/20 0720 07/21/20 0430 07/22/20 0434 07/23/20 0349 07/24/20 0533  NA 139 139 140 137 137  K 3.8 3.7 3.4* 4.4 3.7  CL 98 99 96* 95* 95*  CO2 34* 32 34* 31 31  GLUCOSE 100* 98 105* 110* 105*  BUN 27* 27* 25* 27* 22  CREATININE 0.64 0.67 0.66 0.77 0.74  CALCIUM 8.7* 8.8* 8.8* 9.2 9.0  MG 2.3 2.6* 2.3 2.4 2.3  PHOS 3.5 3.2 3.1 3.4 3.5   Liver Function Tests: Recent Labs  Lab 07/20/20 0720 07/21/20 0430 07/22/20 0434 07/23/20 0349 07/24/20 0533  AST 15 14* 15 17 19   ALT 11 12 12 13 13   ALKPHOS 43 37* 48 53 52  BILITOT 0.9 1.1 0.9 0.9 0.8  PROT 5.1* 5.0* 5.3* 5.9* 5.7*  ALBUMIN 2.8* 2.8* 2.9* 3.3* 3.2*   No results for input(s): LIPASE, AMYLASE in the last 168 hours. No results for input(s): AMMONIA in the last 168 hours. CBC: Recent Labs  Lab 07/20/20 0720 07/21/20 0430 07/22/20 0434 07/23/20 0349 07/24/20 0533  WBC 5.7 5.6 8.5 8.3 7.3  NEUTROABS 3.4 3.2 6.3 5.1 5.0  HGB 7.6* 7.5* 7.6* 8.5* 8.6*  HCT 27.3* 27.9* 28.4* 30.2* 30.8*  MCV 74.0* 77.5* 76.8* 74.8* 77.4*  PLT 168  185 234 289 292   Cardiac Enzymes: No results for input(s): CKTOTAL, CKMB, CKMBINDEX, TROPONINI in the last 168 hours. BNP: Invalid input(s): POCBNP CBG: No results for input(s): GLUCAP in the last 168 hours. D-Dimer No results for input(s): DDIMER in the last 72 hours. Hgb A1c No results for input(s): HGBA1C in the last 72 hours. Lipid Profile No results for input(s): CHOL, HDL, LDLCALC, TRIG, CHOLHDL, LDLDIRECT in the last 72 hours. Thyroid function studies No results for input(s): TSH, T4TOTAL, T3FREE, THYROIDAB in the last 72 hours.  Invalid input(s): FREET3 Anemia work up No results for input(s): VITAMINB12, FOLATE, FERRITIN, TIBC, IRON, RETICCTPCT in the last 72 hours. Urinalysis    Component Value Date/Time   COLORURINE YELLOW (A) 07/13/2020 0009   APPEARANCEUR CLEAR (A) 07/13/2020 0009   LABSPEC 1.018 07/13/2020 0009   PHURINE 5.0 07/13/2020 0009   GLUCOSEU NEGATIVE 07/13/2020 0009   HGBUR NEGATIVE 07/13/2020 0009   BILIRUBINUR NEGATIVE 07/13/2020 0009   KETONESUR NEGATIVE 07/13/2020 0009   PROTEINUR NEGATIVE 07/13/2020 0009   NITRITE NEGATIVE 07/13/2020 0009   LEUKOCYTESUR NEGATIVE 07/13/2020 0009   Sepsis Labs Invalid input(s): PROCALCITONIN,  WBC,  LACTICIDVEN Microbiology No results found for this or any previous visit (from the past 240 hour(s)).   Time coordinating discharge: Over 30 minutes  SIGNED:   Nolberto Hanlon, MD  [facility] 07/24/2020, 4:53 PM Pager   If 7PM-7AM, please contact night-coverage [email or web address] Password [email or web address]

## 2020-07-24 NOTE — Progress Notes (Signed)
SUBJECTIVE: Patient resting comfortably in her recliner. Patient stated her breathing is much improved and has recently walked with PT/OT. Denies chest pain.   Vitals:   07/24/20 0500 07/24/20 0734 07/24/20 0928 07/24/20 1109  BP:  (!) 155/67    Pulse:  90    Resp:  16    Temp:  98.4 F (36.9 C)    TempSrc:  Oral    SpO2:  98% 96%   Weight: 72.7 kg   71.9 kg  Height:        Intake/Output Summary (Last 24 hours) at 07/24/2020 1456 Last data filed at 07/24/2020 1354 Gross per 24 hour  Intake 480 ml  Output 400 ml  Net 80 ml    LABS: Basic Metabolic Panel: Recent Labs    07/23/20 0349 07/24/20 0533  NA 137 137  K 4.4 3.7  CL 95* 95*  CO2 31 31  GLUCOSE 110* 105*  BUN 27* 22  CREATININE 0.77 0.74  CALCIUM 9.2 9.0  MG 2.4 2.3  PHOS 3.4 3.5   Liver Function Tests: Recent Labs    07/23/20 0349 07/24/20 0533  AST 17 19  ALT 13 13  ALKPHOS 53 52  BILITOT 0.9 0.8  PROT 5.9* 5.7*  ALBUMIN 3.3* 3.2*   No results for input(s): LIPASE, AMYLASE in the last 72 hours. CBC: Recent Labs    07/23/20 0349 07/24/20 0533  WBC 8.3 7.3  NEUTROABS 5.1 5.0  HGB 8.5* 8.6*  HCT 30.2* 30.8*  MCV 74.8* 77.4*  PLT 289 292   Cardiac Enzymes: No results for input(s): CKTOTAL, CKMB, CKMBINDEX, TROPONINI in the last 72 hours. BNP: Invalid input(s): POCBNP D-Dimer: No results for input(s): DDIMER in the last 72 hours. Hemoglobin A1C: No results for input(s): HGBA1C in the last 72 hours. Fasting Lipid Panel: No results for input(s): CHOL, HDL, LDLCALC, TRIG, CHOLHDL, LDLDIRECT in the last 72 hours. Thyroid Function Tests: No results for input(s): TSH, T4TOTAL, T3FREE, THYROIDAB in the last 72 hours.  Invalid input(s): FREET3 Anemia Panel: No results for input(s): VITAMINB12, FOLATE, FERRITIN, TIBC, IRON, RETICCTPCT in the last 72 hours.   PHYSICAL EXAM General: Well developed, well nourished, in no acute distress HEENT:  Normocephalic and atramatic Neck:  No JVD.   Lungs: Clear bilaterally to auscultation and percussion. Heart: HRRR . Normal S1 and S2 without gallops or murmurs.  Abdomen: Bowel sounds are positive, abdomen soft and non-tender  Msk:  Back normal, normal gait. Normal strength and tone for age. Extremities: No clubbing, cyanosis or edema.   Neuro: Alert and oriented X 3. Psych:  Good affect, responds appropriately  TELEMETRY: NSR. 90/bpm  ASSESSMENT AND PLAN: Patient presenting to the emergency department and found to have an acute CVA as well as HFpEF vs COPD exacerbation.  Patient now on room air with continued improvement of chest x ray.  With stable kidney function would recommend oral torsemide at discharge  For HFpEF please continue metoprolol and spironolactone.  With low diastolics we will hold on starting an ACE or ARB.  From a cardiac point of view, patient is stable for discharge. Would recommend patient follow up with her primary cardiologist, Dr Ubaldo Glassing, within the next two weeks.  Principal Problem:   Acute CVA (cerebrovascular accident) [facility]) Active Problems:   Coronary artery disease   Benign hypertension   Acute on chronic blood loss anemia   Severe Symptomatic anemia   Acute metabolic encephalopathy   COPD (chronic obstructive pulmonary disease) ([place])   Stroke (cerebrum) (  [place])    Adaline Sill, NP-C 07/24/2020 2:56 PM

## 2020-07-24 NOTE — Progress Notes (Signed)
Physical Therapy Treatment Patient Details Name: Ashley [name] MRN: [id number] DOB: 10-07-1931 Today's Date: 07/24/2020    History of Present Illness Per MD note:Ashley [name] is a 86 y.o. female with medical history significant for CAD, COPD, HTN who presents to the emergency room with altered mental status x2 days.  Most of the history is taken from the son who states that he noticed that his mother was lethargic and not her usual self and appeared to not recognize him and his symptoms worsened into today prompting him to bring her into the emergency room.  He states that she had been complaining of weakness for the past 2 weeks which she attributed to her age.  She saw her PCP 3 weeks prior and had blood work done and also recently saw her cardiologist.  He also states that she has been having hip pain and has been seeing the orthopedist and taking Tylenol but no NSAIDs for her pain.  She has had no cough, fever chills or shortness of breath.  No reports of nausea vomiting abdominal pain or change in bowel habits and no dysuria    PT Comments    Pt was long sititng in bed upon arriving. She is alert and oriented x 3. Just finished breakfast and was agreeable to PT session. Pt has remained off O2 since previous morning session. She requires increased time to process desired task but was able to perform all mobility, transfers, and gait with CGA. Vcs throughout for improved technique. Supportive son present and issued gait belt for home use. Pt will benefoit from HHPT at DC to address deficts and improve safe functional mobility. At conclusion of session, pt was sitting in recliner with BLEs elevated, call bell in reach, and RT in room.      Follow Up Recommendations  Home health PT;Supervision for mobility/OOB     Equipment Recommendations  None recommended by PT    Recommendations for Other Services       Precautions / Restrictions Precautions Precautions: Fall Restrictions Weight  Bearing Restrictions: No    Mobility  Bed Mobility Overal bed mobility: Needs Assistance Bed Mobility: Supine to Sit     Supine to sit: Min guard;HOB elevated     General bed mobility comments: Increased time to perform with vcs for improved technique  Transfers Overall transfer level: Needs assistance Equipment used: Rolling walker (2 wheeled) Transfers: Sit to/from Stand Sit to Stand: Min guard         General transfer comment: CGA for safety with VCs for improved technique and fwd wt shift. issued gait belt to son for home use.  Ambulation/Gait Ambulation/Gait assistance: Min guard Gait Distance (Feet): 200 Feet Assistive device: Rolling walker (2 wheeled) Gait Pattern/deviations: Drifts right/left;WFL(Within Functional Limits) Gait velocity: decreased   General Gait Details: Pt was able to ambulated 200 ft in hallway without O2 while maintaining > 95% sao2. NO LOB or unsteadiness.HR elevated to 112bpm form 98bpm prior to ambulation.       Balance Overall balance assessment: Needs assistance Sitting-balance support: Feet supported Sitting balance-Leahy Scale: Good Sitting balance - Comments: no LOB in sitting   Standing balance support: Bilateral upper extremity supported Standing balance-Leahy Scale: Fair Standing balance comment: no LOB with UE support         Cognition Arousal/Alertness: Awake/alert Behavior During Therapy: WFL for tasks assessed/performed Overall Cognitive Status: History of cognitive impairments - at baseline      General Comments: Pt is Alert throughout session. Oriented  x 3 but does have cognition deficits at baseline per family.             Pertinent Vitals/Pain Pain Assessment: No/denies pain           PT Goals (current goals can now be found in the care plan section) Acute Rehab PT Goals Patient Stated Goal: to go home Progress towards PT goals: Progressing toward goals    Frequency    7X/week      PT Plan  Current plan remains appropriate       AM-PAC PT "6 Clicks" Mobility   Outcome Measure  Help needed turning from your back to your side while in a flat bed without using bedrails?: A Little Help needed moving from lying on your back to sitting on the side of a flat bed without using bedrails?: A Little Help needed moving to and from a bed to a chair (including a wheelchair)?: A Little Help needed standing up from a chair using your arms (e.g., wheelchair or bedside chair)?: A Little Help needed to walk in hospital room?: A Little Help needed climbing 3-5 steps with a railing? : A Little 6 Click Score: 18    End of Session Equipment Utilized During Treatment: Gait belt;Oxygen Activity Tolerance: Patient tolerated treatment well Patient left: in chair;with call bell/phone within reach;with chair alarm set;with family/visitor present Nurse Communication: Mobility status PT Visit Diagnosis: Muscle weakness (generalized) (M62.81);Difficulty in walking, not elsewhere classified (R26.2)     Time: 0677-0340 PT Time Calculation (min) (ACUTE ONLY): 19 min  Charges:  $Gait Training: 8-22 mins                     Julaine Fusi PTA 07/24/20, 10:00 AM

## 2020-07-26 ENCOUNTER — Telehealth: Payer: Self-pay | Admitting: Primary Care

## 2020-07-29 ENCOUNTER — Other Ambulatory Visit: Payer: Medicare Other | Admitting: Primary Care

## 2020-07-29 ENCOUNTER — Other Ambulatory Visit: Payer: Self-pay

## 2020-07-29 DIAGNOSIS — I639 Cerebral infarction, unspecified: Secondary | ICD-10-CM

## 2020-07-29 DIAGNOSIS — Z515 Encounter for palliative care: Secondary | ICD-10-CM

## 2020-07-29 DIAGNOSIS — J449 Chronic obstructive pulmonary disease, unspecified: Secondary | ICD-10-CM

## 2020-07-29 NOTE — Progress Notes (Signed)
[facility] Consult Note Telephone: [phone]  Fax: [phone]  PATIENT NAME: Ashley [name] [address] [place] [address] [phone] (home)  DOB: 11-Nov-1930 MRN: [id number]  PRIMARY CARE PROVIDER:    Albina Billet, MD,  [address]   [place] [address] [phone]  REFERRING PROVIDER:   Albina Billet, MD [address]   [place],  [place] [address] [phone]  RESPONSIBLE PARTY:   Extended Emergency Contact Information Primary Emergency Contact: Karmella, Bouvier Address: [address]          [place], [place] [address] Johnnette Litter of [place] Phone: [phone] Relation: Son Secondary Emergency Contact: Steele,Cheryl Mobile Phone: [phone] Relation: Daughter  I met face to face with patient and family in  home.  ASSESSMENT AND RECOMMENDATIONS:   1. Advance Care Planning/Goals of Care: Goals include to maximize quality of life and symptom management. Our advance care planning conversation included a discussion about:     The value and importance of advance care planning    Exploration of goals of care in the event of a sudden injury or illness   Identification and preparation of a healthcare agent  Goals  Include remaining at home and not going to a facility. We discussed their exploring assisted living which patient was opposed to . However she likely does not have capacity to fully appreciate extent of care needed. There are 6 adult children and an "adopted daughter' involved in her care currently. We discussed getting information from the community RE payor sources (medicaid) and possible programs to assist with adls. She does have friends who are currently helping. We discussed progression of dementia and providing for future needed care.   2. Symptom Management:   Caregiving: MOW has been serving her, and she has med alert system as well.  Also discussed PACE, PCS, Chore, medicaid  applications.  DME: Has hospital bed but does not like it due to poor bed mobility. Asked to discuss with PT RE DME and transfers.Has a new ramp which she's getting used to. Has bathroom adaptations.  Sensory deficits: HOH, has not been to audiology. Encouraged to f/u with assessment and getting some hearing aids and eye exam if due.   Nutrition:  Has MOW, nutritional supplements. Was making breakfast prior to stroke. Now sometimes does not eat evening meals. Encouraged to use ensure or similar.  Mobility: able to get up from lift chair and on to standard walker. DOE from walking 30'. Wheezing and endorses taking Combivent 3 x/day. May benefit from longer acting inhaler.  Pain:  Endorse pain in right hip s/p replacement. Recommend ATC acetaminophen CR.  Home health is currently sending in PT, OT, HHA and RN. I recommend asking SW to also visit to address medicaid eligibility. Daughter to ask PT to obtain SW orders or PCP can fax please.   3. Follow up Palliative Care Visit: Palliative care will continue to follow for goals of care clarification and symptom management. Return 4 weeks or prn.  4. Family /Caregiver/Community Supports: Lives alone in own home. Adult children are currently staying with her and planning care.  5. Cognitive / Functional decline:  A and O x 2, forgetful due to recent CVA. Able to ambulate with walker. Needs assistance with most adls, all iadls due to memory.   I spent 75 minutes providing this consultation,  from 1415 to 1630. More than 50% of the time in this consultation was spent  coordinating communication.   CHIEF COMPLAINT: self care deficits  HISTORY OF PRESENT ILLNESS:  Ashley [name] is a 86 y.o. year old female with multiple medical problems including CVA, copd, immobility, short term memory loss. Palliative Care was asked to follow this patient by consultation request of Albina Billet, MD to help address advance care planning and goals of care. This is the  initial visit   CODE STATUS: TBD  PPS: 50%  HOSPICE ELIGIBILITY/DIAGNOSIS: no  PHYSICAL EXAM / ROS:   Current and past weights: historically 162 lbs.  General: NAD, frail appearing,  WNWD Cardiovascular: no chest pain reported, no  LE edema  Pulmonary: no cough, no increased SOB, room air Abdomen: appetite fair, endorses constipation, continent of bowel GU: denies dysuria, continent of urine MSK:  + joint and ROM abnormalities, ambulatory with walker, denies falls Skin: no rashes or wounds reported Neurological: Weakness, endorses pain, short term memory loss due to CVA.   CURRENT PROBLEM LIST:  Patient Active Problem List   Diagnosis Date Noted  . Stroke (cerebrum) ([facility]) 07/14/2020  . Coronary artery disease 07/13/2020  . Benign hypertension 07/13/2020  . Acute CVA (cerebrovascular accident) ([facility]) 07/13/2020  . Acute on chronic blood loss anemia 07/13/2020  . Severe Symptomatic anemia 07/13/2020  . Acute metabolic encephalopathy 93/79/0240  . COPD (chronic obstructive pulmonary disease) ([facility]) 07/13/2020  . Cholecystitis   . Choledocholithiasis 07/15/2018  . Chest pain 07/13/2018  . Primary osteoarthritis of right hip 02/20/2016   PAST MEDICAL HISTORY:  Past Medical History:  Diagnosis Date  . Anemia   . Anxiety   . Arthritis   . COPD (chronic obstructive pulmonary disease) ([facility])   . Coronary artery disease   . Edema    feet/ankles   wears support hose  . GERD (gastroesophageal reflux disease)   . GI (gastrointestinal bleed)    history  . Hypertension   . Myocardial infarction ([facility])   . Peripheral vascular disease ([facility])   . Shortness of breath dyspnea     SOCIAL HX:  Social History   Tobacco Use  . Smoking status: Former Smoker    Quit date: 02/11/1997    Years since quitting: 23.4  . Smokeless tobacco: Never Used  Substance Use Topics  . Alcohol use: No   FAMILY HX: No family history on file.  ALLERGIES: No Known Allergies   PERTINENT MEDICATIONS:    Outpatient Encounter Medications as of 84/01/2020  Medication Sig  . ALPRAZolam (XANAX) 0.25 MG tablet Take 0.25 mg by mouth 2 (two) times daily.   Marland Kitchen amLODipine (NORVASC) 10 MG tablet Take 10 mg by mouth daily.  Marland Kitchen aspirin EC 81 MG EC tablet Take 1 tablet (81 mg total) by mouth daily. Swallow whole.  . Ipratropium-Albuterol (COMBIVENT RESPIMAT) 20-100 MCG/ACT AERS respimat Inhale 1 puff into the lungs every 6 (six) hours.  . isosorbide dinitrate (ISORDIL) 30 MG tablet Take 30 mg by mouth 2 (two) times daily.  . metoprolol succinate (TOPROL-XL) 25 MG 24 hr tablet Take 3 tablets (75 mg total) by mouth daily. Take with or immediately following a meal.  . nitroGLYCERIN (NITROSTAT) 0.4 MG SL tablet Place 1 tablet (0.4 mg total) under the tongue every 5 (five) minutes as needed for chest pain.  . pantoprazole (PROTONIX) 40 MG tablet Take 1 tablet (40 mg total) by mouth 2 (two) times daily.  . pantoprazole (PROTONIX) 40 MG tablet Take 1 tablet (40 mg total) by mouth 2 (two) times daily.  . polyethylene glycol (  MIRALAX / GLYCOLAX) 17 g packet Take 17 g by mouth daily.  Marland Kitchen senna-docusate (SENOKOT-S) 8.6-50 MG tablet Take 1 tablet by mouth 2 (two) times daily for 5 days.  . simvastatin (ZOCOR) 40 MG tablet Take 40 mg by mouth daily.  Marland Kitchen spironolactone (ALDACTONE) 25 MG tablet Take 1 tablet (25 mg total) by mouth daily.  . vitamin B-12 (CYANOCOBALAMIN) 1000 MCG tablet Take 1,000 mcg by mouth daily.   No facility-administered encounter medications on file as of 07/29/2020.     Jason Coop, NP , DNP, MPH, [facility]  COVID-19 PATIENT SCREENING TOOL  Person answering questions: ____________daughter__Cheryl____ _____   1.  Is the patient or any family member in the home showing any signs or symptoms regarding respiratory infection?               Person with Symptom- __________NA_________________  a. Fever                                                                          Yes___ No___           ___________________  b. Shortness of breath                                                    Yes___ No___          ___________________ c. Cough/congestion                                       Yes___  No___         ___________________ d. Body aches/pains                                                         Yes___ No___        ____________________ e. Gastrointestinal symptoms (diarrhea, nausea)           Yes___ No___        ____________________  2. Within the past 14 days, has anyone living in the home had any contact with someone with or under investigation for COVID-19?    Yes___ No_X_   Person __________________

## 2020-08-12 ENCOUNTER — Ambulatory Visit (INDEPENDENT_AMBULATORY_CARE_PROVIDER_SITE_OTHER): Payer: Medicare Other | Admitting: Vascular Surgery

## 2020-08-12 DIAGNOSIS — Z8673 Personal history of transient ischemic attack (TIA), and cerebral infarction without residual deficits: Secondary | ICD-10-CM | POA: Insufficient documentation

## 2020-08-22 ENCOUNTER — Ambulatory Visit (INDEPENDENT_AMBULATORY_CARE_PROVIDER_SITE_OTHER): Payer: Medicare Other | Admitting: Vascular Surgery

## 2020-09-03 ENCOUNTER — Other Ambulatory Visit: Payer: Medicare Other | Admitting: Primary Care

## 2020-09-10 ENCOUNTER — Other Ambulatory Visit: Payer: Self-pay

## 2020-09-10 ENCOUNTER — Other Ambulatory Visit: Payer: Medicare Other | Admitting: Primary Care

## 2020-09-10 DIAGNOSIS — Z515 Encounter for palliative care: Secondary | ICD-10-CM

## 2020-09-10 DIAGNOSIS — J449 Chronic obstructive pulmonary disease, unspecified: Secondary | ICD-10-CM

## 2020-09-10 DIAGNOSIS — I639 Cerebral infarction, unspecified: Secondary | ICD-10-CM

## 2020-09-10 NOTE — Progress Notes (Signed)
[facility] Consult Note Telephone: [phone]  Fax: [phone]     Date of encounter: 09/10/20 PATIENT NAME: Ashley [name] [address] [place] [address] [phone] (home)  DOB: October 29, 1930 MRN: [id number]  PRIMARY CARE PROVIDER:    Albina Billet, MD,  [address]   [place] [address] [phone]  REFERRING PROVIDER:   Albina Billet, MD [address]   [place],  [place] [address] [phone]  RESPONSIBLE PARTY:   Extended Emergency Contact Information Primary Emergency Contact: Ivan, Maskell Address: [address]          [place], [place] [address] Johnnette Litter of [place] Phone: [phone] Relation: Son Secondary Emergency Contact: Steele,Cheryl Mobile Phone: [phone] Relation: Daughter  I met face to face with patient and family in  home. Palliative Care was asked to follow this patient by consultation request of Albina Billet, MD to help address advance care planning and goals of care. This is a follow up  visit.   ASSESSMENT AND RECOMMENDATIONS:   1. Advance Care Planning/Goals of Care: Goals include to maximize quality of life and symptom management. Our advance care planning conversation included a discussion about:     The value and importance of advance care planning   Experiences with loved ones who have been seriously ill or have died   Exploration of personal, cultural or spiritual beliefs that might influence medical decisions   Exploration of goals of care in the event of a sudden injury or illness   Identification of a healthcare agent - children  Review of an  advance directive document =  We reviewed the MOST form. Family wanted to review before completion. Patient states she would not want to languish on machines if no hope, and be allowed to go to heaven.  2. Symptom Management:   Depression: Has had early wakening, increased anxiety and poor sleep. She has  periods of crying over her dreams and losses.  Reports nightmares. Recommend sertraline 25 mg x 2 weeks and increase to 50 mg daily for ongoing depression management. Recommend in addition to xanax, and to trial prior to an increase in benzodiazepine.   Caregiver strain: Has some caregivers but not 24 hrs/ day. Encouraged to hire helpers so as to not burn out RE caregiving.Goal is to remain in her home.  GI Bleed h/o: On Protonix for GI bleed. X years.  Taking iron now. Endorses ability to feel weaker.  Monitored by PCP via CBC.  Skin integrity: Area reported of reddness under breasts, recommend OTC anti fungal. She is using corn starch now which I recommended against. She has used OTC anti fungal in past with good results.  Mobility: Uses walker and lift chair. Has had PT thru Nov.  Discussed difficulty of getting out of the house in a car. Discussed [facility] van, lift seat for better mobility.  3. Follow up Palliative Care Visit: Palliative care will continue to follow for goals of care clarification and symptom management. Return4- 6 weeks or prn.  4. Family /Caregiver/Community Supports: Lives alone but has local children. Has other friends and neighbors who check in.  5. Cognitive / Functional decline: A and O x 2-3, forgetful. Able to ambulate in home. Forgetful. I spent 60 minutes providing this consultation,  from 1100 to 1200. More than 50% of the time in this consultation was spent coordinating communication.   CODE STATUS: DNR, most  discussed with family. Will  continue to discuss.  PPS: 50%  HOSPICE ELIGIBILITY/DIAGNOSIS: no  Subjective:  CHIEF COMPLAINT: debility  HISTORY OF PRESENT ILLNESS:  Ashley [name] is a 86 y.o. year old female  with debility, dementia, care giver strain, self care deficits. She is home after a  CVA in September. She endorses memory loss, not remembering things, what to do. Daughter is caregiving along with some of there 5 children. Chief issues are  safety, mobility and ability to provide some self care.  We are asked to consult around symptom management and care giver goals. ACP has not been done so that will be discussed.    History obtained from review of EMR, discussion with primary team, and  interview with family, caregiver  and/or Ms. Islam. Records reviewed and summarized above.   CURRENT PROBLEM LIST:  Patient Active Problem List   Diagnosis Date Noted  . Stroke (cerebrum) ([facility]) 07/14/2020  . Coronary artery disease 07/13/2020  . Benign hypertension 07/13/2020  . Acute CVA (cerebrovascular accident) ([facility] [place]) 07/13/2020  . Acute on chronic blood loss anemia 07/13/2020  . Severe Symptomatic anemia 07/13/2020  . Acute metabolic encephalopathy 35/36/1443  . COPD (chronic obstructive pulmonary disease) ([facility]) 07/13/2020  . Cholecystitis   . Choledocholithiasis 07/15/2018  . Chest pain 07/13/2018  . Primary osteoarthritis of right hip 02/20/2016   PAST MEDICAL HISTORY:  Active Ambulatory Problems    Diagnosis Date Noted  . Primary osteoarthritis of right hip 02/20/2016  . Chest pain 07/13/2018  . Choledocholithiasis 07/15/2018  . Cholecystitis   . Coronary artery disease 07/13/2020  . Benign hypertension 07/13/2020  . Acute CVA (cerebrovascular accident) ([facility]) 07/13/2020  . Acute on chronic blood loss anemia 07/13/2020  . Severe Symptomatic anemia 07/13/2020  . Acute metabolic encephalopathy 15/40/0867  . COPD (chronic obstructive pulmonary disease) ([facility]) 07/13/2020  . Stroke (cerebrum) ([facility]) 07/14/2020   Resolved Ambulatory Problems    Diagnosis Date Noted  . No Resolved Ambulatory Problems   Past Medical History:  Diagnosis Date  . Anemia   . Anxiety   . Arthritis   . Edema   . GERD (gastroesophageal reflux disease)   . GI (gastrointestinal bleed)   . Hypertension   . Myocardial infarction ([facility])   . Peripheral vascular disease ([facility])   . Shortness of breath dyspnea    SOCIAL HX:  Social History    Tobacco Use  . Smoking status: Former Smoker    Quit date: 02/11/1997    Years since quitting: 23.5  . Smokeless tobacco: Never Used  Substance Use Topics  . Alcohol use: No   FAMILY HX: No family history on file.  ALLERGIES: No Known Allergies   PERTINENT MEDICATIONS:  Outpatient Encounter Medications as of 09/10/2020  Medication Sig  . ALPRAZolam (XANAX) 0.25 MG tablet Take 0.25 mg by mouth 2 (two) times daily.   Marland Kitchen amLODipine (NORVASC) 10 MG tablet Take 10 mg by mouth daily.  Marland Kitchen aspirin EC 81 MG EC tablet Take 1 tablet (81 mg total) by mouth daily. Swallow whole.  . Ipratropium-Albuterol (COMBIVENT RESPIMAT) 20-100 MCG/ACT AERS respimat Inhale 1 puff into the lungs every 6 (six) hours.  . isosorbide dinitrate (ISORDIL) 30 MG tablet Take 30 mg by mouth 2 (two) times daily.  . metoprolol succinate (TOPROL-XL) 25 MG 24 hr tablet Take 3 tablets (75 mg total) by mouth daily. Take with or immediately following a meal.  . nitroGLYCERIN (NITROSTAT) 0.4 MG SL tablet Place 1 tablet (0.4 mg total) under the tongue every 5 (five)  minutes as needed for chest pain.  . pantoprazole (PROTONIX) 40 MG tablet Take 1 tablet (40 mg total) by mouth 2 (two) times daily.  . pantoprazole (PROTONIX) 40 MG tablet Take 1 tablet (40 mg total) by mouth 2 (two) times daily.  . polyethylene glycol (MIRALAX / GLYCOLAX) 17 g packet Take 17 g by mouth daily.  . simvastatin (ZOCOR) 40 MG tablet Take 40 mg by mouth daily.  Marland Kitchen spironolactone (ALDACTONE) 25 MG tablet Take 1 tablet (25 mg total) by mouth daily.  . vitamin B-12 (CYANOCOBALAMIN) 1000 MCG tablet Take 1,000 mcg by mouth daily.   No facility-administered encounter medications on file as of 09/10/2020.    Objective: ROS   General: NAD ENMT: denies dysphagia Cardiovascular: denies chest pain Pulmonary: denies cough, denies increased SOB , endorse some DOEW Abdomen: endorses good  appetite, denies constipation, endorses continence of bowel GU: denies  dysuria, endorses continence of urine MSK:  endorses ROM limitations, no falls reported, uses walker Skin: Endorses intertriginous lesions under breast. Neurological: endorses weakness, denies occ  pain, endorses insomnia  And nightmares Psych: Endorses anxious and depressive mood Heme/lymph/immuno: denies bruises  Physical Exam: Current and past weights: unavailable Constitutional: NAD General :frail appearing, Obese , HOH EYES: anicteric sclera,lids intact, no discharge  ENMT: intact hearing,oral mucous membranes moist, dentition intact CV: S1S2, RRR, no LE edema Pulmonary: LCTA, no increased work of breathing, no cough, no audible wheezes, room air Abdomen: intake 100%, normo-active BS +  4 quadrants, soft and non tender, no ascites GU: deferred MSK: moderate sacropenia, decreased ROM in all extremities, no contractures of LE, non- ambulatory Skin: warm and dry, no rashes or wounds on visible skin, yeast reported Neuro: Weakness, moderate cognitive impairment Psych: anxious affect, A and O x 2 Hem/lymph/immuno: no widespread bruising   Thank you for the opportunity to participate in the care of Ms. Brafford.  The palliative care team will continue to follow. Please call our office at [phone] if we can be of additional assistance.  Jason Coop, NP , DNP, MPH, AGPCNP-BC, ACHPN  COVID-19 PATIENT SCREENING TOOL  Person answering questions: ____________daughter______ _____   1.  Is the patient or any family member in the home showing any signs or symptoms regarding respiratory infection?               Person with Symptom- __________NA_________________  a. Fever                                                                          Yes___ No___          ___________________  b. Shortness of breath                                                    Yes___ No___          ___________________ c. Cough/congestion  Yes___  No___          ___________________ d. Body aches/pains                                                         Yes___ No___        ____________________ e. Gastrointestinal symptoms (diarrhea, nausea)           Yes___ No___        ____________________  2. Within the past 14 days, has anyone living in the home had any contact with someone with or under investigation for COVID-19?    Yes___ No_X_   Person __________________

## 2020-10-22 ENCOUNTER — Other Ambulatory Visit: Payer: Medicare Other | Admitting: Primary Care

## 2020-11-25 ENCOUNTER — Other Ambulatory Visit: Payer: Self-pay

## 2020-11-25 ENCOUNTER — Other Ambulatory Visit: Payer: Medicare Other | Admitting: Primary Care

## 2020-11-25 ENCOUNTER — Encounter: Payer: Self-pay | Admitting: Primary Care

## 2020-11-25 DIAGNOSIS — J449 Chronic obstructive pulmonary disease, unspecified: Secondary | ICD-10-CM

## 2020-11-25 DIAGNOSIS — I639 Cerebral infarction, unspecified: Secondary | ICD-10-CM

## 2020-11-25 DIAGNOSIS — Z515 Encounter for palliative care: Secondary | ICD-10-CM

## 2020-11-25 NOTE — Progress Notes (Signed)
[facility] Palliative Care Consult Note Telephone: [phone]  Fax: [phone]     Date of encounter: 11/25/20 PATIENT NAME: Ashley [name] [address] [employer] [place] [address] [phone] (home)  DOB: [birth year] MRN: [id number]  PRIMARY CARE PROVIDER:    Albina Billet, MD,  [address]   [place] [address] [phone]  REFERRING PROVIDER:   Albina Billet, MD [address]   [place],  [place] [address] [phone]  RESPONSIBLE PARTY:   Extended Emergency Contact Information Primary Emergency Contact: Ashley, [name] Address: [address]          [place], [place] [address] Ashley [name] Phone: [phone] Relation: Son Secondary Emergency Contact: Ashley,[name] Mobile Phone: [phone] Relation: Daughter  I met face to face with patient and family in  home. Palliative Care was asked to follow this patient by consultation request of Albina Billet, MD to help address advance care planning and goals of care. This is a follow up  visit.   ASSESSMENT AND RECOMMENDATIONS:   1. Advance Care Planning/Goals of Care: Goals include to maximize quality of life and symptom management. Our advance care planning conversation included a discussion about:     The value and importance of advance care planning   Exploration of personal, cultural or spiritual beliefs that might influence medical decisions   Exploration of goals of care in the event of a sudden injury or illness   Identification and preparation of a healthcare agent - sons  Review of an  advance directive document .  Decision not to resuscitate on file.  MOST form is in brother's possession. We have discussed the form and questions. Left a second copy and will reach out to [employer] or [place]. Discussed the questions of range of intervention.  2. Symptom Management:   Pain: Has leg pain, worse at night. Has acetaminophen CR 650 mg q 8 hrs.  Recommend extra at 650 mg at hs.   Mobility: Discussed sustained need for ongoing exercise. Instructed with chair exercises daily to sustain got mobility.   Safety: Has caregivers during day most of the time, not at night. Has cameras for check ins and med alerts. Caregivers have also had some health issues but she has care covered.  Cognitive impairment: Has declined some per daughter's report. Able to converse today, and ambulate with stand by assist and walker  Nocturia: Gets up 3-4 x / night. Instr to limite fluids in afternoon and hs. Endorses water at bedside. Limiting fluids may help reduce sleep disturbances.  3. Follow up Palliative Care Visit: Palliative care will continue to follow for goals of care clarification and symptom management. Return 10 weeks or prn.  I spent 60 minutes providing this consultation,  from 1030 to 1130. More than 50% of the time in this consultation was spent in counseling and care coordination.  CODE STATUS:DNR  PPS: 50%  HOSPICE ELIGIBILITY/DIAGNOSIS: no  Subjective:  CHIEF COMPLAINT: pain  HISTORY OF PRESENT ILLNESS:  Ashley [name] is a 87 y.o. year old female  with h/o cva, dementia, LE pain, COPD. She has increased pain in legs, worse at hs, in context of arthritis or vascular disesase. She has tried some CR acetaminophen with fair results. Also new on antidepressant SSRI which may be helpin.   We are asked to consult around advance care planning and complex medical decision making.    Review and summarization of old Epic records shows  or history from other than patient. Review of case with family member daughter  History obtained from review of EMR, discussion with primary team, and  interview with family, caregiver  and/or Ashley [name]. Records reviewed and summarized above.   CURRENT PROBLEM LIST:  Patient Active Problem List   Diagnosis Date Noted  . Stroke (cerebrum) ([facility]) 07/14/2020  . Coronary artery disease 07/13/2020  . Benign  hypertension 07/13/2020  . Acute CVA (cerebrovascular accident) ([facility]) 07/13/2020  . Acute on chronic blood loss anemia 07/13/2020  . Severe Symptomatic anemia 07/13/2020  . Acute metabolic encephalopathy 74/25/9563  . COPD (chronic obstructive pulmonary disease) ([facility]) 07/13/2020  . Cholecystitis   . Choledocholithiasis 07/15/2018  . Chest pain 07/13/2018  . Primary osteoarthritis of right hip 02/20/2016   PAST MEDICAL HISTORY:  Active Ambulatory Problems    Diagnosis Date Noted  . Primary osteoarthritis of right hip 02/20/2016  . Chest pain 07/13/2018  . Choledocholithiasis 07/15/2018  . Cholecystitis   . Coronary artery disease 07/13/2020  . Benign hypertension 07/13/2020  . Acute CVA (cerebrovascular accident) ([facility]) 07/13/2020  . Acute on chronic blood loss anemia 07/13/2020  . Severe Symptomatic anemia 07/13/2020  . Acute metabolic encephalopathy 87/56/4332  . COPD (chronic obstructive pulmonary disease) ([facility]) 07/13/2020  . Stroke (cerebrum) ([facility]) 07/14/2020   Resolved Ambulatory Problems    Diagnosis Date Noted  . No Resolved Ambulatory Problems   Past Medical History:  Diagnosis Date  . Anemia   . Anxiety   . Arthritis   . Edema   . GERD (gastroesophageal reflux disease)   . GI (gastrointestinal bleed)   . Hypertension   . Myocardial infarction ([facility])   . Peripheral vascular disease ([facility])   . Shortness of breath dyspnea    SOCIAL HX:  Social History   Tobacco Use  . Smoking status: Former Smoker    Quit date: 02/11/1997    Years since quitting: 23.8  . Smokeless tobacco: Never Used  Substance Use Topics  . Alcohol use: No   FAMILY HX:  Family History  Problem Relation Age of Onset  . Heart attack Father   . Stroke Sister   . Testicular cancer Brother   . COPD Brother      ALLERGIES: No Known Allergies   PERTINENT MEDICATIONS:   Outpatient Encounter Medications as of 11/25/2020  Medication Sig  . ALPRAZolam (XANAX) 0.25 MG tablet Take 0.25 mg by  mouth 2 (two) times daily.   Marland Kitchen amLODipine (NORVASC) 10 MG tablet Take 10 mg by mouth daily.  Marland Kitchen aspirin EC 81 MG EC tablet Take 1 tablet (81 mg total) by mouth daily. Swallow whole.  . Ipratropium-Albuterol (COMBIVENT RESPIMAT) 20-100 MCG/ACT AERS respimat Inhale 1 puff into the lungs every 6 (six) hours.  . isosorbide dinitrate (ISORDIL) 30 MG tablet Take 30 mg by mouth 2 (two) times daily.  . metoprolol succinate (TOPROL-XL) 25 MG 24 hr tablet Take 3 tablets (75 mg total) by mouth daily. Take with or immediately following a meal.  . nitroGLYCERIN (NITROSTAT) 0.4 MG SL tablet Place 1 tablet (0.4 mg total) under the tongue every 5 (five) minutes as needed for chest pain.  . pantoprazole (PROTONIX) 40 MG tablet Take 1 tablet (40 mg total) by mouth 2 (two) times daily.  . pantoprazole (PROTONIX) 40 MG tablet Take 1 tablet (40 mg total) by mouth 2 (two) times daily.  . polyethylene glycol (MIRALAX / GLYCOLAX) 17 g packet Take 17 g by mouth daily.  . simvastatin (ZOCOR) 40  MG tablet Take 40 mg by mouth daily.  Marland Kitchen spironolactone (ALDACTONE) 25 MG tablet Take 1 tablet (25 mg total) by mouth daily.  . vitamin B-12 (CYANOCOBALAMIN) 1000 MCG tablet Take 1,000 mcg by mouth daily.   No facility-administered encounter medications on file as of 11/25/2020.     Objective: ROS  General: NAD EYES: denies vision changes, wears glasses ENMT: denies dysphagia Cardiovascular: denies chest pain Pulmonary: denies  cough, denies increased SOB, + DOE Abdomen: endorses fair appetite, endorses occ constipation, endorses continence of bowel  GU: denies dysuria, endorses continence of urine MSK:  endorses ROM limitations, no falls reported Skin: denies rashes or wounds Neurological: endorses weakness,endorses pain, endorses occ  insomnia Psych: Endorses positive mood Heme/lymph/immuno: denies bruises, abnormal bleeding  Physical Exam: Current and past weights:unavailable Constitutional: Vital signs, 123/65 hr  66 RR 20 General: frail appearing, WNWD EYES: anicteric sclera, lids intact, no discharge  ENMT: slight loss of  hearing,oral mucous membranes moist, dentition intact CV: S1S2, RRR, no R  LE edema, slight L LE edema Pulmonary: LCTA, no increased work of breathing, occ  cough, no audible wheezes, room air Abdomen: intake 50-75%, normo-active BS +  4 quadrants, soft and non tender, no ascites GU: deferred MSK: mod sarcopenia, decreased ROM in all extremities, no contractures of LE,  Ambulatory with walker Skin: warm and dry, no rashes or wounds on visible skin Neuro: Generalized weakness, mild to mod  cognitive impairment Psych: non-anxious affect, A and O x 2-3 Hem/lymph/immuno: no widespread bruising   Thank you for the opportunity to participate in the care of Ms. Mulvey.  The palliative care team will continue to follow. Please call our office at [phone] if we can be of additional assistance.  Jason Coop, NP , DNP, MPH, AGPCNP-BC, ACHPN   COVID-19 PATIENT SCREENING TOOL  Person answering questions: _______daughter________   1.  Is the patient or any family member in the home showing any signs or symptoms regarding respiratory infection?                  Person with Symptom  ______________na___________ a. Fever/chills/headache                                                        Yes___ No__X_            b. Shortness of breath                                                            Yes___ No__X_           c. Cough/congestion                                               Yes___  No__X_          d. Muscle/Body aches/pains  Yes___ No__X_         e. Gastrointestinal symptoms (diarrhea,nausea)             Yes___ No__X_         f. Sudden loss of smell or taste      Yes___ No__X_        2. Within the past 10 days, has anyone living in the home had any contact with someone with or under investigation for COVID-19?     Yes___ No__X__   Person __________________

## 2020-12-05 ENCOUNTER — Emergency Department: Payer: Medicare Other

## 2020-12-05 ENCOUNTER — Inpatient Hospital Stay
Admission: EM | Admit: 2020-12-05 | Discharge: 2020-12-13 | DRG: 083 | Disposition: A | Discharge status: Skilled Nursing Facility | Payer: Medicare Other | Attending: Internal Medicine | Admitting: Internal Medicine

## 2020-12-05 ENCOUNTER — Other Ambulatory Visit: Payer: Self-pay

## 2020-12-05 DIAGNOSIS — Z825 Family history of asthma and other chronic lower respiratory diseases: Secondary | ICD-10-CM

## 2020-12-05 DIAGNOSIS — Z79899 Other long term (current) drug therapy: Secondary | ICD-10-CM

## 2020-12-05 DIAGNOSIS — Z8673 Personal history of transient ischemic attack (TIA), and cerebral infarction without residual deficits: Secondary | ICD-10-CM | POA: Diagnosis not present

## 2020-12-05 DIAGNOSIS — E785 Hyperlipidemia, unspecified: Secondary | ICD-10-CM | POA: Diagnosis present

## 2020-12-05 DIAGNOSIS — W19XXXA Unspecified fall, initial encounter: Secondary | ICD-10-CM | POA: Diagnosis present

## 2020-12-05 DIAGNOSIS — I609 Nontraumatic subarachnoid hemorrhage, unspecified: Secondary | ICD-10-CM

## 2020-12-05 DIAGNOSIS — I10 Essential (primary) hypertension: Secondary | ICD-10-CM

## 2020-12-05 DIAGNOSIS — S066X1A Traumatic subarachnoid hemorrhage with loss of consciousness of 30 minutes or less, initial encounter: Secondary | ICD-10-CM

## 2020-12-05 DIAGNOSIS — Z96641 Presence of right artificial hip joint: Secondary | ICD-10-CM | POA: Diagnosis present

## 2020-12-05 DIAGNOSIS — I639 Cerebral infarction, unspecified: Secondary | ICD-10-CM | POA: Diagnosis present

## 2020-12-05 DIAGNOSIS — Z823 Family history of stroke: Secondary | ICD-10-CM

## 2020-12-05 DIAGNOSIS — E669 Obesity, unspecified: Secondary | ICD-10-CM | POA: Diagnosis present

## 2020-12-05 DIAGNOSIS — I671 Cerebral aneurysm, nonruptured: Secondary | ICD-10-CM | POA: Diagnosis present

## 2020-12-05 DIAGNOSIS — E875 Hyperkalemia: Secondary | ICD-10-CM | POA: Diagnosis not present

## 2020-12-05 DIAGNOSIS — E871 Hypo-osmolality and hyponatremia: Secondary | ICD-10-CM | POA: Diagnosis present

## 2020-12-05 DIAGNOSIS — I110 Hypertensive heart disease with heart failure: Secondary | ICD-10-CM | POA: Diagnosis present

## 2020-12-05 DIAGNOSIS — J449 Chronic obstructive pulmonary disease, unspecified: Secondary | ICD-10-CM | POA: Diagnosis present

## 2020-12-05 DIAGNOSIS — S066X9A Traumatic subarachnoid hemorrhage with loss of consciousness of unspecified duration, initial encounter: Secondary | ICD-10-CM | POA: Diagnosis not present

## 2020-12-05 DIAGNOSIS — L8990 Pressure ulcer of unspecified site, unspecified stage: Secondary | ICD-10-CM | POA: Insufficient documentation

## 2020-12-05 DIAGNOSIS — F32A Depression, unspecified: Secondary | ICD-10-CM | POA: Diagnosis present

## 2020-12-05 DIAGNOSIS — Z955 Presence of coronary angioplasty implant and graft: Secondary | ICD-10-CM

## 2020-12-05 DIAGNOSIS — Z6829 Body mass index (BMI) 29.0-29.9, adult: Secondary | ICD-10-CM

## 2020-12-05 DIAGNOSIS — Z8249 Family history of ischemic heart disease and other diseases of the circulatory system: Secondary | ICD-10-CM

## 2020-12-05 DIAGNOSIS — Z7982 Long term (current) use of aspirin: Secondary | ICD-10-CM

## 2020-12-05 DIAGNOSIS — I5032 Chronic diastolic (congestive) heart failure: Secondary | ICD-10-CM | POA: Diagnosis present

## 2020-12-05 DIAGNOSIS — I2510 Atherosclerotic heart disease of native coronary artery without angina pectoris: Secondary | ICD-10-CM | POA: Diagnosis present

## 2020-12-05 DIAGNOSIS — Z66 Do not resuscitate: Secondary | ICD-10-CM | POA: Diagnosis present

## 2020-12-05 DIAGNOSIS — R531 Weakness: Secondary | ICD-10-CM

## 2020-12-05 DIAGNOSIS — Z87891 Personal history of nicotine dependence: Secondary | ICD-10-CM

## 2020-12-05 DIAGNOSIS — S066XAA Traumatic subarachnoid hemorrhage with loss of consciousness status unknown, initial encounter: Secondary | ICD-10-CM | POA: Diagnosis present

## 2020-12-05 DIAGNOSIS — R451 Restlessness and agitation: Secondary | ICD-10-CM | POA: Diagnosis present

## 2020-12-05 DIAGNOSIS — Z20822 Contact with and (suspected) exposure to covid-19: Secondary | ICD-10-CM | POA: Diagnosis present

## 2020-12-05 DIAGNOSIS — F419 Anxiety disorder, unspecified: Secondary | ICD-10-CM | POA: Diagnosis present

## 2020-12-05 DIAGNOSIS — W1830XA Fall on same level, unspecified, initial encounter: Secondary | ICD-10-CM | POA: Diagnosis present

## 2020-12-05 DIAGNOSIS — F05 Delirium due to known physiological condition: Secondary | ICD-10-CM | POA: Diagnosis present

## 2020-12-05 DIAGNOSIS — I959 Hypotension, unspecified: Secondary | ICD-10-CM | POA: Diagnosis present

## 2020-12-05 LAB — BASIC METABOLIC PANEL
Anion gap: 10 (ref 5–15)
BUN: 18 mg/dL (ref 8–23)
CO2: 24 mmol/L (ref 22–32)
Calcium: 9.4 mg/dL (ref 8.9–10.3)
Chloride: 93 mmol/L — ABNORMAL LOW (ref 98–111)
Creatinine, Ser: 0.59 mg/dL (ref 0.44–1.00)
GFR, Estimated: >60 mL/min (ref >60)
Glucose, Bld: 106 mg/dL — ABNORMAL HIGH (ref 70–99)
Potassium: 4.3 mmol/L (ref 3.5–5.1)
Sodium: 127 mmol/L — ABNORMAL LOW (ref 135–145)

## 2020-12-05 LAB — CBC WITH DIFFERENTIAL/PLATELET
Abs Immature Granulocytes: 0.03 10*3/uL (ref 0.00–0.07)
Basophils Absolute: 0 10*3/uL (ref 0.0–0.1)
Basophils Relative: 0 %
Eosinophils Absolute: 0 10*3/uL (ref 0.0–0.5)
Eosinophils Relative: 0 %
HCT: 45.6 % (ref 36.0–46.0)
Hemoglobin: 15 g/dL (ref 12.0–15.0)
Immature Granulocytes: 0 %
Lymphocytes Relative: 7 %
Lymphs Abs: 0.9 10*3/uL (ref 0.7–4.0)
MCH: 28.5 pg (ref 26.0–34.0)
MCHC: 32.9 g/dL (ref 30.0–36.0)
MCV: 86.7 fL (ref 80.0–100.0)
Monocytes Absolute: 0.7 10*3/uL (ref 0.1–1.0)
Monocytes Relative: 6 %
Neutro Abs: 11.4 10*3/uL — ABNORMAL HIGH (ref 1.7–7.7)
Neutrophils Relative %: 87 %
Platelets: 231 10*3/uL (ref 150–400)
RBC: 5.26 MIL/uL — ABNORMAL HIGH (ref 3.87–5.11)
RDW: 13.2 % (ref 11.5–15.5)
WBC: 13.1 10*3/uL — ABNORMAL HIGH (ref 4.0–10.5)
nRBC: 0 % (ref 0.0–0.2)

## 2020-12-05 LAB — URINALYSIS, COMPLETE (UACMP) WITH MICROSCOPIC
Bilirubin Urine: NEGATIVE
Glucose, UA: NEGATIVE mg/dL
Ketones, ur: NEGATIVE mg/dL
Leukocytes,Ua: NEGATIVE
Nitrite: NEGATIVE
Protein, ur: NEGATIVE mg/dL
Specific Gravity, Urine: 1.009 (ref 1.005–1.030)
pH: 8 (ref 5.0–8.0)

## 2020-12-05 LAB — PROTIME-INR
INR: 1 (ref 0.8–1.2)
Prothrombin Time: 12.8 seconds (ref 11.4–15.2)

## 2020-12-05 LAB — RESP PANEL BY RT-PCR (FLU A&B, COVID) ARPGX2
Influenza A by PCR: NEGATIVE
Influenza B by PCR: NEGATIVE
SARS Coronavirus 2 by RT PCR: NEGATIVE

## 2020-12-05 LAB — COMPREHENSIVE METABOLIC PANEL
ALT: 15 U/L (ref 0–44)
AST: 22 U/L (ref 15–41)
Albumin: 3.9 g/dL (ref 3.5–5.0)
Alkaline Phosphatase: 59 U/L (ref 38–126)
Anion gap: 5 (ref 5–15)
BUN: 20 mg/dL (ref 8–23)
CO2: 27 mmol/L (ref 22–32)
Calcium: 9.2 mg/dL (ref 8.9–10.3)
Chloride: 94 mmol/L — ABNORMAL LOW (ref 98–111)
Creatinine, Ser: 0.56 mg/dL (ref 0.44–1.00)
GFR, Estimated: >60 mL/min (ref >60)
Glucose, Bld: 111 mg/dL — ABNORMAL HIGH (ref 70–99)
Potassium: 4.4 mmol/L (ref 3.5–5.1)
Sodium: 126 mmol/L — ABNORMAL LOW (ref 135–145)
Total Bilirubin: 1.1 mg/dL (ref 0.3–1.2)
Total Protein: 7.1 g/dL (ref 6.5–8.1)

## 2020-12-05 LAB — APTT: aPTT: 25 seconds (ref 24–36)

## 2020-12-05 LAB — TSH: TSH: 1.674 u[IU]/mL (ref 0.350–4.500)

## 2020-12-05 LAB — TROPONIN I (HIGH SENSITIVITY)
Troponin I (High Sensitivity): 6 ng/L (ref <18)
Troponin I (High Sensitivity): 7 ng/L (ref <18)

## 2020-12-05 LAB — OSMOLALITY: Osmolality: 268 mOsm/kg — ABNORMAL LOW (ref 275–295)

## 2020-12-05 MED ORDER — ONDANSETRON HCL 4 MG PO TABS
4.0000 mg | ORAL_TABLET | Freq: Four times a day (QID) | ORAL | Status: DC | PRN
  Administered 2020-12-06: 4 mg via ORAL
  Filled 2020-12-05: qty 1

## 2020-12-05 MED ORDER — HYPROMELLOSE (GONIOSCOPIC) 2.5 % OP SOLN
1.0000 [drp] | OPHTHALMIC | Status: DC | PRN
  Filled 2020-12-05 (×2): qty 15

## 2020-12-05 MED ORDER — ONDANSETRON HCL 4 MG/2ML IJ SOLN
4.0000 mg | Freq: Once | INTRAMUSCULAR | Status: AC
  Administered 2020-12-05: 4 mg via INTRAVENOUS
  Filled 2020-12-05: qty 2

## 2020-12-05 MED ORDER — AMLODIPINE BESYLATE 10 MG PO TABS
10.0000 mg | ORAL_TABLET | Freq: Every day | ORAL | Status: DC
  Administered 2020-12-06 – 2020-12-13 (×8): 10 mg via ORAL
  Filled 2020-12-05 (×8): qty 1

## 2020-12-05 MED ORDER — METOPROLOL SUCCINATE ER 50 MG PO TB24
75.0000 mg | ORAL_TABLET | Freq: Every day | ORAL | Status: DC
  Administered 2020-12-06 – 2020-12-13 (×8): 75 mg via ORAL
  Filled 2020-12-05 (×8): qty 1

## 2020-12-05 MED ORDER — VITAMIN B-12 1000 MCG PO TABS
1000.0000 ug | ORAL_TABLET | Freq: Every day | ORAL | Status: DC
  Administered 2020-12-06 – 2020-12-13 (×8): 1000 ug via ORAL
  Filled 2020-12-05 (×9): qty 1

## 2020-12-05 MED ORDER — ISOSORBIDE DINITRATE 30 MG PO TABS
30.0000 mg | ORAL_TABLET | Freq: Two times a day (BID) | ORAL | Status: DC
  Administered 2020-12-05 – 2020-12-11 (×10): 30 mg via ORAL
  Filled 2020-12-05 (×17): qty 1

## 2020-12-05 MED ORDER — SODIUM CHLORIDE 0.9% FLUSH: 10.0000 mL | INTRAVENOUS | Status: DC | PRN

## 2020-12-05 MED ORDER — AMLODIPINE BESYLATE 5 MG PO TABS: 10.0000 mg | ORAL_TABLET | Freq: Every day | ORAL | Status: DC

## 2020-12-05 MED ORDER — AMLODIPINE BESYLATE 5 MG PO TABS
10.0000 mg | ORAL_TABLET | Freq: Once | ORAL | Status: AC
  Administered 2020-12-05: 10 mg via ORAL
  Filled 2020-12-05: qty 2

## 2020-12-05 MED ORDER — PANTOPRAZOLE SODIUM 40 MG PO TBEC
40.0000 mg | DELAYED_RELEASE_TABLET | Freq: Two times a day (BID) | ORAL | Status: DC
  Administered 2020-12-05 – 2020-12-13 (×16): 40 mg via ORAL
  Filled 2020-12-05 (×16): qty 1

## 2020-12-05 MED ORDER — SPIRONOLACTONE 25 MG PO TABS
25.0000 mg | ORAL_TABLET | Freq: Every day | ORAL | Status: DC
  Administered 2020-12-06 – 2020-12-08 (×3): 25 mg via ORAL
  Filled 2020-12-05 (×4): qty 1

## 2020-12-05 MED ORDER — ACETAMINOPHEN 325 MG PO TABS
325.0000 mg | ORAL_TABLET | Freq: Four times a day (QID) | ORAL | Status: AC | PRN
  Administered 2020-12-06 – 2020-12-08 (×4): 325 mg via ORAL
  Filled 2020-12-05 (×4): qty 1

## 2020-12-05 MED ORDER — ONDANSETRON HCL 4 MG/2ML IJ SOLN
4.0000 mg | Freq: Four times a day (QID) | INTRAMUSCULAR | Status: DC | PRN
  Administered 2020-12-07: 4 mg via INTRAVENOUS
  Filled 2020-12-05: qty 2

## 2020-12-05 MED ORDER — METOPROLOL TARTRATE 5 MG/5ML IV SOLN: 5.0000 mg | INTRAVENOUS | Status: DC | PRN

## 2020-12-05 MED ORDER — POLYVINYL ALCOHOL 1.4 % OP SOLN
1.0000 [drp] | OPHTHALMIC | Status: DC | PRN
  Administered 2020-12-05: 1 [drp] via OPHTHALMIC
  Filled 2020-12-05 (×2): qty 15

## 2020-12-05 MED ORDER — SODIUM CHLORIDE 0.9 % IV SOLN: Freq: Once | INTRAVENOUS | Status: AC

## 2020-12-05 MED ORDER — SIMVASTATIN 20 MG PO TABS
40.0000 mg | ORAL_TABLET | Freq: Every day | ORAL | Status: DC
  Administered 2020-12-05 – 2020-12-12 (×8): 40 mg via ORAL
  Filled 2020-12-05: qty 4
  Filled 2020-12-05 (×8): qty 2

## 2020-12-05 MED ORDER — IPRATROPIUM-ALBUTEROL 20-100 MCG/ACT IN AERS
1.0000 | INHALATION_SPRAY | Freq: Four times a day (QID) | RESPIRATORY_TRACT | Status: DC
  Administered 2020-12-05 – 2020-12-13 (×28): 1 via RESPIRATORY_TRACT
  Filled 2020-12-05 (×2): qty 4

## 2020-12-05 MED ORDER — SODIUM CHLORIDE 0.9% FLUSH
10.0000 mL | Freq: Two times a day (BID) | INTRAVENOUS | Status: DC
  Administered 2020-12-05 – 2020-12-13 (×16): 10 mL

## 2020-12-05 MED ORDER — ACETAMINOPHEN 650 MG RE SUPP: 325.0000 mg | Freq: Four times a day (QID) | RECTAL | Status: AC | PRN

## 2020-12-05 MED ORDER — ALPRAZOLAM 0.25 MG PO TABS
0.2500 mg | ORAL_TABLET | Freq: Two times a day (BID) | ORAL | Status: DC | PRN
  Administered 2020-12-06 – 2020-12-12 (×6): 0.25 mg via ORAL
  Filled 2020-12-05 (×6): qty 1

## 2020-12-05 MED ORDER — LISINOPRIL 10 MG PO TABS
10.0000 mg | ORAL_TABLET | Freq: Every day | ORAL | Status: DC
  Administered 2020-12-07 – 2020-12-11 (×5): 10 mg via ORAL
  Filled 2020-12-05 (×5): qty 1

## 2020-12-05 MED ORDER — SODIUM CHLORIDE 0.9 % IV SOLN: INTRAVENOUS | Status: AC

## 2020-12-05 MED ORDER — FERROUS SULFATE 325 (65 FE) MG PO TABS
325.0000 mg | ORAL_TABLET | ORAL | Status: DC
  Administered 2020-12-06 – 2020-12-13 (×4): 325 mg via ORAL
  Filled 2020-12-05 (×5): qty 1

## 2020-12-05 NOTE — ED Notes (Signed)
Fed patient tray with grilled chicken peas and brown rice. Pt ate most of chicken and small portion of peas and rice. AS

## 2020-12-05 NOTE — H&P (Signed)
History and Physical   Ashley [name]:[id number] DOB: [birth year] DOA: 12/05/2020  PCP: Jaclyn Shaggy, MD  Outpatient Specialists: Community palliative care with [facility] Patient coming from: Home  I have personally briefly reviewed patient's old medical records in [facility] EMR.  Chief Concern: Head trauma and loss of consciousness  HPI: Ashley [name] is a 87 y.o. female with medical history significant for hypertension, hyperlipidemia, CAD, COPD, depression, history of stroke presents to the ED for chief concern of falling with head trauma and loss of consciousness.  She reports that a doorbell ring as she was getting up from her chair to answer the door and the next thing she remembers she woke up and she was laying on her back on the floor.  She denies presyncopal feelings of chest pain, shortness of breath, dizziness.  She endorses that in the last couple of months she has been feeling very weak.  She notes that Zoloft was started a few months ago and since then she has felt very lethargic and weak.  She denies fever, cough, shortness of breath, chest pain, abdominal pain.  Social history: lives by herself. She is retired and formerly worked in a IT trainer. She formerly smoked tobacco and quit in 1999. She formerly smoked 2/3 of a pack per day. She never drank etoh.   ROS: Constitutional: no weight change, no fever ENT/Mouth: no sore throat, no rhinorrhea Eyes: no eye pain, no vision changes Cardiovascular: no chest pain, no dyspnea,  no edema, no palpitations Respiratory: no cough, no sputum, no wheezing Gastrointestinal: no nausea, no vomiting, no diarrhea, no constipation Genitourinary: no urinary incontinence, no dysuria, no hematuria Musculoskeletal: no arthralgias, no myalgias Skin: no skin lesions, no pruritus, Neuro: + weakness, + loss of consciousness, + syncope Psych: no anxiety, no depression, + decrease appetite Heme/Lymph: no bruising, no  bleeding  ED Course: Discussed with ED provider, patient requiring hospitalization due to falling and loss of consciousness.  Assessment/Plan  Active Problems:   Subarachnoid hematoma (HCC)   Subarachnoid hemorrhage-neurosurgery was consulted and suspect likely secondary to trauma and not due to an aneurysmal rupture -Repeat CT head without contrast imaging was negative changes to bleed -Frequent neurochecks  Hyponatremia-new -Patient endorses low-sodium diet -Zoloft was added 2 to 3 months ago and this is the only new medication -Holding Zoloft -Status post normal saline infusion once per ED provider -Normal saline IVF at 100 ml/h -Repeat BMP at this time -Frequent neurochecks -If serum sodium level does not improve appropriately, would consider holding home spironolactone  Depression-patient denies SI and HI -I will hold Zoloft at this time due to possible adverse effect of SIADH  Heart failure preserved ejection fraction -Grade 1 diastolic dysfunction on echo in 07/13/2020 -Resumed home lisinopril 10 mg daily, metoprolol succinate 75 mg daily, spironolactone 25 mg daily  Hypertension-resumed home antihypertensives -Amlodipine 10 mg daily, isosorbide dinitrate 30 mg twice daily, lisinopril 10 mg daily, metoprolol succinate 75 mg daily, spironolactone 25 mg daily  Anxiety-resumed home alprazolam 0.25 mg twice daily as needed for anxiety  Chart reviewed.   DVT prophylaxis: TED hose Code Status: DNR Diet: Heart healthy Family Communication: Discussed with son at bedside Disposition Plan: Pending clinical course Consults called: Neurosurgery Admission status: Observation to progressive cardiac  Past Medical History:  Diagnosis Date  . Anemia   . Anxiety   . Arthritis   . COPD (chronic obstructive pulmonary disease) (HCC)   . Coronary artery disease   . Edema  feet/ankles   wears support hose  . GERD (gastroesophageal reflux disease)   . GI (gastrointestinal  bleed)    history  . Hypertension   . Myocardial infarction (HCC)   . Peripheral vascular disease (HCC)   . Shortness of breath dyspnea    Past Surgical History:  Procedure Laterality Date  . ABDOMINAL HYSTERECTOMY    . ABLATION     right leg less than 1 year ago  . CATARACT EXTRACTION W/ INTRAOCULAR LENS  IMPLANT, BILATERAL Bilateral   . CHOLECYSTECTOMY N/A 07/18/2018   Procedure: LAPAROSCOPIC CHOLECYSTECTOMY;  Surgeon: Leafy Ro, MD;  Location: [facility] ORS;  Service: General;  Laterality: N/A;  . CORONARY ANGIOPLASTY     stents  . ENDOSCOPIC RETROGRADE CHOLANGIOPANCREATOGRAPHY (ERCP) WITH PROPOFOL N/A 07/15/2018   Procedure: ENDOSCOPIC RETROGRADE CHOLANGIOPANCREATOGRAPHY (ERCP) WITH PROPOFOL;  Surgeon: Midge Minium, MD;  Location: [facility] ENDOSCOPY;  Service: Endoscopy;  Laterality: N/A;  . EYE SURGERY    . FRACTURE SURGERY     right wrist  . TOTAL HIP ARTHROPLASTY Right 02/20/2016   Procedure: TOTAL HIP ARTHROPLASTY ANTERIOR APPROACH;  Surgeon: Kennedy Bucker, MD;  Location: [facility] ORS;  Service: Orthopedics;  Laterality: Right;   Social History:  reports that she quit smoking about 23 years ago. She has never used smokeless tobacco. She reports that she does not drink alcohol and does not use drugs.  No Known Allergies Family History  Problem Relation Age of Onset  . Heart attack Father   . Stroke Sister   . Testicular cancer Brother   . COPD Brother    Family history: Family history reviewed and not pertinent  Prior to Admission medications   Medication Sig Start Date End Date Taking? Authorizing Provider  acetaminophen (TYLENOL) 650 MG CR tablet Take 650 mg by mouth every 8 (eight) hours as needed for pain.   Yes [provider]  ALPRAZolam (XANAX) 0.25 MG tablet Take 0.25 mg by mouth 2 (two) times daily as needed for anxiety.   Yes [provider]  amLODipine (NORVASC) 10 MG tablet Take 10 mg by mouth daily.   Yes [provider]  aspirin EC 81 MG  EC tablet Take 1 tablet (81 mg total) by mouth daily. Swallow whole. 07/25/20  Yes Lynn Ito, MD  Ferrous Sulfate (IRON) 325 (65 Fe) MG TABS Take 325 mg by mouth every Monday, Wednesday, and Friday.   Yes [provider]  Ipratropium-Albuterol (COMBIVENT) 20-100 MCG/ACT AERS respimat Inhale 1 puff into the lungs in the morning, at noon, in the evening, and at bedtime.   Yes [provider]  isosorbide dinitrate (ISORDIL) 30 MG tablet Take 30 mg by mouth 2 (two) times daily.   Yes [provider]  lisinopril (ZESTRIL) 10 MG tablet Take 10 mg by mouth daily.   Yes [provider]  metoprolol succinate (TOPROL-XL) 50 MG 24 hr tablet Take 75 mg by mouth daily. 11/14/20  Yes [provider]  pantoprazole (PROTONIX) 40 MG tablet Take 40 mg by mouth 2 (two) times daily.   Yes [provider]  sertraline (ZOLOFT) 25 MG tablet Take 25 mg by mouth daily. 10/26/20  Yes [provider]  simvastatin (ZOCOR) 40 MG tablet Take 40 mg by mouth daily.   Yes [provider]  spironolactone (ALDACTONE) 25 MG tablet Take 25 mg by mouth daily.   Yes [provider]  vitamin B-12 (CYANOCOBALAMIN) 1000 MCG tablet Take 1,000 mcg by mouth daily.   Yes [provider]  Physical Exam: Vitals:   12/05/20 1602 12/05/20 1630 12/05/20 1700 12/05/20 1730  BP:  (!) 149/77 (!) 147/73 (!) 159/75  Pulse:  88 77 88  Resp:  20 20 13   SpO2: 98% 98% 92% 96%  Weight:      Height:       Constitutional: appears age-appropriate, NAD, calm, comfortable Eyes: PERRL, lids and conjunctivae normal ENMT: Mucous membranes are moist. Posterior pharynx clear of any exudate or lesions. Age-appropriate dentition.  Mild to moderate hearing loss Neck: normal, supple, no masses, no thyromegaly Respiratory: clear to auscultation bilaterally, no wheezing, no crackles. Normal respiratory effort. No accessory muscle use.  Cardiovascular: Regular rate and rhythm,  no murmurs / rubs / gallops. No extremity edema. 2+ pedal pulses. No carotid bruits.  Abdomen: no tenderness, no masses palpated, no hepatosplenomegaly. Bowel sounds positive.  Musculoskeletal: no clubbing / cyanosis. No joint deformity upper and lower extremities. Good ROM, no contractures, no atrophy. Normal muscle tone.  Skin: no rashes, lesions, ulcers. No induration Neurologic: Sensation intact. Strength 5/5 in all 4.  Psychiatric: Normal judgment and insight. Alert and oriented x 3. Normal mood.   EKG: independently reviewed, showing sinus rhythm with rate of 74, QTc 435  Chest x-ray on Admission: I personally reviewed and I agree with radiologist reading as below.  CT Head Wo Contrast  Result Date: 12/05/2020 CLINICAL DATA:  Head trauma.  6 hour repeat study. EXAM: CT HEAD WITHOUT CONTRAST TECHNIQUE: Contiguous axial images were obtained from the base of the skull through the vertex without intravenous contrast. COMPARISON:  CT head from the same day. FINDINGS: Brain: Similar bifrontal subarachnoid hemorrhage. Slight increase in volume/conspicuity of subarachnoid hemorrhage along the left posterior frontal convexity (series 2, image 22). Similar possible small hypodense subdural collections, as described on the prior. No evidence of acute large vascular territory infarct. Similar remote right occipital lobe infarct with encephalomalacia. Similar patchy white matter hypoattenuation, likely related to chronic microvascular ischemic change. Similar 6 mm calcified mass along the inner table of the right frontal bone, likely representing a meningioma without substantial mass effect. Basal cisterns are patent. Vascular: Calcific atherosclerosis. Skull: No acute fracture.  Left posterior scalp contusion. Sinuses/Orbits: Visualized sinuses are clear.  Unremarkable orbits. Other: No mastoid effusions. IMPRESSION: 1. Similar bifrontal subarachnoid hemorrhage. Slight increase in volume/conspicuity of  subarachnoid hemorrhage along the left posterior frontal convexity, which could represent redistribution of hemorrhage or a small volume of new hemorrhage. No substantial mass effect. 2. Similar suspected low density subdural collections, as described on the prior. 3. Remote right occipital infarct and moderate chronic microvascular ischemic change. Electronically Signed   By: Feliberto Harts MD   On: 12/05/2020 17:04   CT Head Wo Contrast  Result Date: 12/05/2020 CLINICAL DATA:  Head trauma Found on floor this morning EXAM: CT HEAD WITHOUT CONTRAST TECHNIQUE: Contiguous axial images were obtained from the base of the skull through the vertex without intravenous contrast. COMPARISON:  07/14/2020 FINDINGS: Brain: Diffuse cortical atrophy. Chronic encephalomalacia of the right occipital lobe consistent with remote infarct. Confluent periventricular white matter hypodensity consistent with chronic ischemic small vessel disease. Subarachnoid hemorrhage noted in the frontal lobes bilaterally. Multiple foci of acute subarachnoid hemorrhage noted overlying the left frontal and parietal lobes. Findings are suspicious for bilateral hypodense subdural fluid collections which have increased since the prior examination. This may be due to chronic subdural hematomas or hygromas. No midline shift. 6 mm calcified mass originating from the inner table of the right frontal bone  is unchanged from prior examination and likely related to a meningioma. No acute infarct. Vascular: No hyperdense vessel or unexpected calcification. Skull: Normal. Negative for fracture or focal lesion. Sinuses/Orbits: No acute finding. Other: Mild hematoma of the left posterior scalp. IMPRESSION: 1. Acute subarachnoid hemorrhage involving the anterior frontal lobes bilaterally. 2. Acute subarachnoid hematoma overlying the left frontal and parietal lobes. 3. Interval increase of low-density subdural collections overlying both cerebral hemispheres  suspicious for chronic subdural hematomas or hygromas. These results were called by telephone at the time of interpretation on 12/05/2020 at 10:52 am to provider Willy Eddy , who verbally acknowledged these results. Electronically Signed   By: Acquanetta Belling M.D.   On: 12/05/2020 10:52   CT Cervical Spine Wo Contrast  Result Date: 12/05/2020 CLINICAL DATA:  Neck trauma.  Fall. EXAM: CT CERVICAL SPINE WITHOUT CONTRAST TECHNIQUE: Multidetector CT imaging of the cervical spine was performed without intravenous contrast. Multiplanar CT image reconstructions were also generated. COMPARISON:  None. FINDINGS: Alignment: Within normal limits Skull base and vertebrae: No acute fracture. No primary bone lesion or focal pathologic process. Soft tissues and spinal canal: No prevertebral fluid or swelling. No visible canal hematoma. Disc levels: No significant disc height loss. Vertebral body heights are maintained. Mild degenerative changes seen throughout the cervical spine. Upper chest: Negative. Other: None. IMPRESSION: No acute abnormality of the cervical or visualized upper thoracic spine. Electronically Signed   By: Acquanetta Belling M.D.   On: 12/05/2020 10:59   DG Chest Portable 1 View  Result Date: 12/05/2020 CLINICAL DATA:  Weakness.  Unwitnessed fall EXAM: PORTABLE CHEST 1 VIEW COMPARISON:  July 24, 2020 FINDINGS: There is cardiomegaly with pulmonary vascularity within normal limits. No edema or airspace opacity. There is an apparent hiatal hernia. There is aortic atherosclerosis. No adenopathy. No bone lesions. IMPRESSION: No edema or airspace opacity. Cardiomegaly. Apparent hiatal hernia. Aortic Atherosclerosis (ICD10-I70.0). Electronically Signed   By: Bretta Bang III M.D.   On: 12/05/2020 10:30   Labs on Admission: I have personally reviewed following labs  CBC: Recent Labs  Lab 12/05/20 1205  WBC 13.1*  NEUTROABS 11.4*  HGB 15.0  HCT 45.6  MCV 86.7  PLT 231   Basic Metabolic  Panel: Recent Labs  Lab 12/05/20 1205  NA 126*  K 4.4  CL 94*  CO2 27  GLUCOSE 111*  BUN 20  CREATININE 0.56  CALCIUM 9.2   GFR: Estimated Creatinine Clearance: 50 mL/min (by C-G formula based on SCr of 0.56 mg/dL). Liver Function Tests: Recent Labs  Lab 12/05/20 1205  AST 22  ALT 15  ALKPHOS 59  BILITOT 1.1  PROT 7.1  ALBUMIN 3.9   Coagulation Profile: Recent Labs  Lab 12/05/20 1205  INR 1.0   Urine analysis:    Component Value Date/Time   COLORURINE YELLOW (A) 12/05/2020 1344   APPEARANCEUR CLOUDY (A) 12/05/2020 1344   LABSPEC 1.009 12/05/2020 1344   PHURINE 8.0 12/05/2020 1344   GLUCOSEU NEGATIVE 12/05/2020 1344   HGBUR SMALL (A) 12/05/2020 1344   BILIRUBINUR NEGATIVE 12/05/2020 1344   KETONESUR NEGATIVE 12/05/2020 1344   PROTEINUR NEGATIVE 12/05/2020 1344   NITRITE NEGATIVE 12/05/2020 1344   LEUKOCYTESUR NEGATIVE 12/05/2020 1344   Jason Frisbee N Arantxa Piercey D.O. [facility]  If 7PM-7AM, please contact overnight-coverage provider If 7AM-7PM, please contact day coverage provider [email or web address]  12/05/2020, 6:17 PM

## 2020-12-05 NOTE — Consult Note (Signed)
Referring Physician:  No referring provider defined for this encounter.  Primary Physician:  Albina Billet, MD  Chief Complaint:  Head injury  History of Present Illness: 12/05/2020 Ashley [name] is a 87 y.o. female who presents with the chief complaint of fall.  She was found down.  She remembers part of the fall.  She denies headache.  She has minor nausea.  She denies photophobia.    Her cognition and neurologic status is at baseline.  Review of Systems:  A 10 point review of systems is negative, except for the pertinent positives and negatives detailed in the HPI.  Past Medical History: Past Medical History:  Diagnosis Date  . Anemia   . Anxiety   . Arthritis   . COPD (chronic obstructive pulmonary disease) ([place])   . Coronary artery disease   . Edema    feet/ankles   wears support hose  . GERD (gastroesophageal reflux disease)   . GI (gastrointestinal bleed)    history  . Hypertension   . Myocardial infarction ([place])   . Peripheral vascular disease ([place])   . Shortness of breath dyspnea     Past Surgical History: Past Surgical History:  Procedure Laterality Date  . ABDOMINAL HYSTERECTOMY    . ABLATION     right leg less than 1 year ago  . CATARACT EXTRACTION W/ INTRAOCULAR LENS  IMPLANT, BILATERAL Bilateral   . CHOLECYSTECTOMY N/A 07/18/2018   Procedure: LAPAROSCOPIC CHOLECYSTECTOMY;  Surgeon: Jules Husbands, MD;  Location: [facility] ORS;  Service: General;  Laterality: N/A;  . CORONARY ANGIOPLASTY     stents  . ENDOSCOPIC RETROGRADE CHOLANGIOPANCREATOGRAPHY (ERCP) WITH PROPOFOL N/A 07/15/2018   Procedure: ENDOSCOPIC RETROGRADE CHOLANGIOPANCREATOGRAPHY (ERCP) WITH PROPOFOL;  Surgeon: [name], MD;  Location: [facility] ENDOSCOPY;  Service: Endoscopy;  Laterality: N/A;  . EYE SURGERY    . FRACTURE SURGERY     right wrist  . TOTAL HIP ARTHROPLASTY Right 02/20/2016   Procedure: TOTAL HIP ARTHROPLASTY ANTERIOR APPROACH;  Surgeon: Hessie Knows, MD;  Location: [facility] ORS;   Service: Orthopedics;  Laterality: Right;    Allergies: Allergies as of 12/05/2020  . (No Known Allergies)    Medications:  Current Facility-Administered Medications:  .  polyvinyl alcohol (LIQUIFILM TEARS) 1.4 % ophthalmic solution 1 drop, 1 drop, Both Eyes, PRN, Dallie Piles, RPH .  sodium chloride flush (NS) 0.9 % injection 10-40 mL, 10-40 mL, Intracatheter, Q12H, Merlyn Lot, MD .  sodium chloride flush (NS) 0.9 % injection 10-40 mL, 10-40 mL, Intracatheter, PRN, Merlyn Lot, MD  Current Outpatient Medications:  .  acetaminophen (TYLENOL) 650 MG CR tablet, Take 650 mg by mouth every 8 (eight) hours as needed for pain., Disp: , Rfl:  .  ALPRAZolam (XANAX) 0.25 MG tablet, Take 0.25 mg by mouth 2 (two) times daily as needed for anxiety., Disp: , Rfl:  .  amLODipine (NORVASC) 10 MG tablet, Take 10 mg by mouth daily., Disp: , Rfl:  .  aspirin EC 81 MG EC tablet, Take 1 tablet (81 mg total) by mouth daily. Swallow whole., Disp: 30 tablet, Rfl: 11 .  Ferrous Sulfate (IRON) 325 (65 Fe) MG TABS, Take 325 mg by mouth every Monday, Wednesday, and Friday., Disp: , Rfl:  .  Ipratropium-Albuterol (COMBIVENT) 20-100 MCG/ACT AERS respimat, Inhale 1 puff into the lungs in the morning, at noon, in the evening, and at bedtime., Disp: , Rfl:  .  isosorbide dinitrate (ISORDIL) 30 MG tablet, Take 30 mg by mouth 2 (two) times  daily., Disp: , Rfl:  .  lisinopril (ZESTRIL) 10 MG tablet, Take 10 mg by mouth daily., Disp: , Rfl:  .  metoprolol succinate (TOPROL-XL) 50 MG 24 hr tablet, Take 75 mg by mouth daily., Disp: , Rfl:  .  pantoprazole (PROTONIX) 40 MG tablet, Take 40 mg by mouth 2 (two) times daily., Disp: , Rfl:  .  sertraline (ZOLOFT) 25 MG tablet, Take 25 mg by mouth daily., Disp: , Rfl:  .  simvastatin (ZOCOR) 40 MG tablet, Take 40 mg by mouth daily., Disp: , Rfl:  .  spironolactone (ALDACTONE) 25 MG tablet, Take 25 mg by mouth daily., Disp: , Rfl:  .  vitamin B-12 (CYANOCOBALAMIN)  1000 MCG tablet, Take 1,000 mcg by mouth daily., Disp: , Rfl:    Social History: Social History   Tobacco Use  . Smoking status: Former Smoker    Quit date: 02/11/1997    Years since quitting: 23.8  . Smokeless tobacco: Never Used  Vaping Use  . Vaping Use: Never used  Substance Use Topics  . Alcohol use: No  . Drug use: No    Family Medical History: Family History  Problem Relation Age of Onset  . Heart attack Father   . Stroke Sister   . Testicular cancer Brother   . COPD Brother     Physical Examination: Vitals:   12/05/20 1700 12/05/20 1730  BP: (!) 147/73 (!) 159/75  Pulse: 77 88  Resp: 20 13  SpO2: 92% 96%     General: Patient is well developed, well nourished, calm, collected, and in no apparent distress.  Psychiatric: Patient is non-anxious.  Head:  Pupils equal, round, and minimally reactive to light.  ENT:  Oral mucosa appears well hydrated.  Neck:   Supple.  Full range of motion.  Respiratory: Patient is breathing without any difficulty.  Extremities: No edema.  Vascular: Palpable pulses in dorsal pedal vessels.  Skin:   On exposed skin, there are no abnormal skin lesions.  NEUROLOGICAL:  General: In no acute distress.   Awake, alert, oriented to person, place, and time.  Pupils equal round and reactive to light.  Facial tone is symmetric.  Tongue protrusion is midline.  There is no pronator drift.  ROM of spine: reduced without pain.  Palpation of spine: nontender.    Strength: Side Biceps Triceps Deltoid Interossei Grip Wrist Ext. Wrist Flex.  R 5 5 5 5 5 5 5   L 5 5 5 5 5 5 5    Side Iliopsoas Quads Hamstring PF DF EHL  R 5 5 5 5 5 5   L 5 5 5 5 5 5    Reflexes are 1+ and symmetric at the biceps, triceps, brachioradialis, patella and achilles.   Bilateral upper and lower extremity sensation is intact to light touch.  Gait is untested.  Hoffman's is absent.  Imaging: CT Head 12/05/20 (repeat) IMPRESSION: 1. Similar bifrontal  subarachnoid hemorrhage. Slight increase in volume/conspicuity of subarachnoid hemorrhage along the left posterior frontal convexity, which could represent redistribution of hemorrhage or a small volume of new hemorrhage. No substantial mass effect. 2. Similar suspected low density subdural collections, as described on the prior. 3. Remote right occipital infarct and moderate chronic microvascular ischemic change.   Electronically Signed   By: Margaretha Sheffield MD   On: 12/05/2020 17:04  I have personally reviewed the images and agree with the above interpretation.  Labs: CBC Latest Ref Rng & Units 12/05/2020 07/24/2020 07/23/2020  WBC 4.0 - 10.5 K/uL  13.1(H) 7.3 8.3  Hemoglobin 12.0 - 15.0 g/dL 15.0 8.6(L) 8.5(L)  Hematocrit 36.0 - 46.0 % 45.6 30.8(L) 30.2(L)  Platelets 150 - 400 K/uL 231 292 289       Assessment and Plan: Ms. Muradyan is a pleasant 87 y.o. female with known L sided aneurysm with traumatic SAH at her baseline.  I feel that her repeat scan is relatively stable.    - No neurosurgical intervention or further observation needed for neurosurgical reasons - I do not feel this is an aneurysmal rupture, given history of trauma and the pattern of SAH being inconsistent with aneurysmal SAH - Admission and workup for hyponatremia per medicine/ER - no follow up scans needed  I have discussed the condition with the patient and her son, including showing the radiographs and discussing treatment options in layman's terms.   Denetra Formoso K. Izora Ribas MD, [place] Dept. of Neurosurgery

## 2020-12-05 NOTE — ED Triage Notes (Signed)
Pt from home, lives alone. Pt found on floor this morning by home health aide. Pt states she hit her head. Pt denies LOC. Fall was unwitnessed. Pt uses walker. Pt states she does not remember how she fell. Pt with c/o nausea. Pt states she thought she was in her chair but was on the floor. Pt alert and oriented x 4.

## 2020-12-05 NOTE — ED Notes (Signed)
Pt was changed and new briefs were placed on her. Pt also was given tray. Pt is being fed by Alyssa, NT.

## 2020-12-05 NOTE — ED Notes (Addendum)
Pt complaining of nausea at this time. Ashley Fowler, MD notified. Zofran was ordered. Pt also stated she wanted some artifical eye drops

## 2020-12-05 NOTE — ED Notes (Signed)
Waiting for eye drops to come from pharmacy 

## 2020-12-05 NOTE — ED Provider Notes (Addendum)
[facility] Emergency Department Provider Note    Event Date/Time   First MD Initiated Contact with Patient 12/05/20 1003     (approximate)  I have reviewed the triage vital signs and the nursing notes.   HISTORY  Chief Complaint Fall    HPI Ashley [name] is a 87 y.o. female the below listed past medical history presents to the ER for evaluation after she was found down behind a door at home today.  She lives at home alone.  Typically uses a walker to ambulate.  Was found by home health nurse.  She does not remember falling or what occurred last night.  She did strike her head.  She denies any pain or pressure.  No discomfort noted.  No new medications.  Does have a history of GI bleeding but she is not had any dark stools or bloody stools.  She is not on any anticoagulation.    Past Medical History:  Diagnosis Date  . Anemia   . Anxiety   . Arthritis   . COPD (chronic obstructive pulmonary disease) ([place])   . Coronary artery disease   . Edema    feet/ankles   wears support hose  . GERD (gastroesophageal reflux disease)   . GI (gastrointestinal bleed)    history  . Hypertension   . Myocardial infarction ([place])   . Peripheral vascular disease ([place])   . Shortness of breath dyspnea    Family History  Problem Relation Age of Onset  . Heart attack Father   . Stroke Sister   . Testicular cancer Brother   . COPD Brother    Past Surgical History:  Procedure Laterality Date  . ABDOMINAL HYSTERECTOMY    . ABLATION     right leg less than 1 year ago  . CATARACT EXTRACTION W/ INTRAOCULAR LENS  IMPLANT, BILATERAL Bilateral   . CHOLECYSTECTOMY N/A 07/18/2018   Procedure: LAPAROSCOPIC CHOLECYSTECTOMY;  Surgeon: Jules Husbands, MD;  Location: [facility] ORS;  Service: General;  Laterality: N/A;  . CORONARY ANGIOPLASTY     stents  . ENDOSCOPIC RETROGRADE CHOLANGIOPANCREATOGRAPHY (ERCP) WITH PROPOFOL N/A 07/15/2018   Procedure: ENDOSCOPIC RETROGRADE  CHOLANGIOPANCREATOGRAPHY (ERCP) WITH PROPOFOL;  Surgeon: [name], MD;  Location: [facility] ENDOSCOPY;  Service: Endoscopy;  Laterality: N/A;  . EYE SURGERY    . FRACTURE SURGERY     right wrist  . TOTAL HIP ARTHROPLASTY Right 02/20/2016   Procedure: TOTAL HIP ARTHROPLASTY ANTERIOR APPROACH;  Surgeon: Hessie Knows, MD;  Location: [facility] ORS;  Service: Orthopedics;  Laterality: Right;   Patient Active Problem List   Diagnosis Date Noted  . 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 Coronary artery disease 07/13/2020  . Benign hypertension 07/13/2020  . Acute CVA (cerebrovascular accident) ([place]) 07/13/2020  . Acute on chronic blood loss anemia 07/13/2020  . Severe Symptomatic anemia 07/13/2020  . Acute metabolic encephalopathy 04/18/7627  . COPD (chronic obstructive pulmonary disease) ([place]) 07/13/2020  . Cholecystitis   . Choledocholithiasis 07/15/2018  . Chest pain 07/13/2018  . Primary osteoarthritis of right hip 02/20/2016      Prior to Admission medications   Medication Sig Start Date End Date Taking? Authorizing Provider  acetaminophen (TYLENOL) 650 MG CR tablet Take 650 mg by mouth every 8 (eight) hours as needed for pain.   Yes [provider]  ALPRAZolam (XANAX) 0.25 MG tablet Take 0.25 mg by mouth 2 (two) times daily as needed for anxiety.   Yes [provider]  amLODipine (NORVASC) 10  MG tablet Take 10 mg by mouth daily.   Yes [provider]  aspirin EC 81 MG EC tablet Take 1 tablet (81 mg total) by mouth daily. Swallow whole. 07/25/20  Yes Nolberto Hanlon, MD  Ferrous Sulfate (IRON) 325 (65 Fe) MG TABS Take 325 mg by mouth every Monday, Wednesday, and Friday.   Yes [provider]  Ipratropium-Albuterol (COMBIVENT) 20-100 MCG/ACT AERS respimat Inhale 1 puff into the lungs in the morning, at noon, in the evening, and at bedtime.   Yes [provider]  isosorbide dinitrate (ISORDIL) 30 MG tablet Take 30 mg by mouth 2 (two) times daily.   Yes  [provider]  lisinopril (ZESTRIL) 10 MG tablet Take 10 mg by mouth daily.   Yes [provider]  metoprolol succinate (TOPROL-XL) 50 MG 24 hr tablet Take 75 mg by mouth daily. 11/14/20  Yes [provider]  pantoprazole (PROTONIX) 40 MG tablet Take 40 mg by mouth 2 (two) times daily.   Yes [provider]  sertraline (ZOLOFT) 25 MG tablet Take 25 mg by mouth daily. 10/26/20  Yes [provider]  simvastatin (ZOCOR) 40 MG tablet Take 40 mg by mouth daily.   Yes [provider]  spironolactone (ALDACTONE) 25 MG tablet Take 25 mg by mouth daily.   Yes [provider]  vitamin B-12 (CYANOCOBALAMIN) 1000 MCG tablet Take 1,000 mcg by mouth daily.   Yes [provider]    Allergies Patient has no known allergies.    Social History Social History   Tobacco Use  . Smoking status: Former Smoker    Quit date: 02/11/1997    Years since quitting: 23.8  . Smokeless tobacco: Never Used  Vaping Use  . Vaping Use: Never used  Substance Use Topics  . Alcohol use: No  . Drug use: No    Review of Systems Patient denies headaches, rhinorrhea, blurry vision, numbness, shortness of breath, chest pain, edema, cough, abdominal pain, nausea, vomiting, diarrhea, dysuria, fevers, rashes or hallucinations unless otherwise stated above in HPI. ____________________________________________   PHYSICAL EXAM:  VITAL SIGNS: Vitals:   12/05/20 1000 12/05/20 1345  BP: (!) 173/85 (!) 156/81  Pulse: 78 80  Resp: (!) 25 (!) 24  SpO2: 94% 94%    Constitutional: Alert and oriented.  Eyes: Conjunctivae are normal.  Head: Atraumatic. Nose: No congestion/rhinnorhea. Mouth/Throat: Mucous membranes are moist.   Neck: No stridor. Painless ROM.  Cardiovascular: Normal rate, regular rhythm. Grossly normal heart sounds.  Good peripheral circulation. Respiratory: Normal respiratory effort.  No retractions. Lungs CTAB. Gastrointestinal: Soft and  nontender. No distention. No abdominal bruits. No CVA tenderness. Genitourinary:  Musculoskeletal: No lower extremity tenderness nor edema.  No joint effusions. Neurologic:  Normal speech and language. No gross focal neurologic deficits are appreciated. No facial droop Skin:  Skin is warm, dry and intact. No rash noted. Psychiatric: Mood and affect are normal. Speech and behavior are normal.  ____________________________________________   LABS (all labs ordered are listed, but only abnormal results are displayed)  Results for orders placed or performed during the hospital encounter of 12/05/20 (from the past 24 hour(s))  CBC with Differential     Status: Abnormal   Collection Time: 12/05/20 12:05 PM  Result Value Ref Range   WBC 13.1 (H) 4.0 - 10.5 K/uL   RBC 5.26 (H) 3.87 - 5.11 MIL/uL   Hemoglobin 15.0 12.0 - 15.0 g/dL   HCT 45.6 36.0 - 46.0 %   MCV 86.7 80.0 -  100.0 fL   MCH 28.5 26.0 - 34.0 pg   MCHC 32.9 30.0 - 36.0 g/dL   RDW 13.2 11.5 - 15.5 %   Platelets 231 150 - 400 K/uL   nRBC 0.0 0.0 - 0.2 %   Neutrophils Relative % 87 %   Neutro Abs 11.4 (H) 1.7 - 7.7 K/uL   Lymphocytes Relative 7 %   Lymphs Abs 0.9 0.7 - 4.0 K/uL   Monocytes Relative 6 %   Monocytes Absolute 0.7 0.1 - 1.0 K/uL   Eosinophils Relative 0 %   Eosinophils Absolute 0.0 0.0 - 0.5 K/uL   Basophils Relative 0 %   Basophils Absolute 0.0 0.0 - 0.1 K/uL   Immature Granulocytes 0 %   Abs Immature Granulocytes 0.03 0.00 - 0.07 K/uL  Comprehensive metabolic panel     Status: Abnormal   Collection Time: 12/05/20 12:05 PM  Result Value Ref Range   Sodium 126 (L) 135 - 145 mmol/L   Potassium 4.4 3.5 - 5.1 mmol/L   Chloride 94 (L) 98 - 111 mmol/L   CO2 27 22 - 32 mmol/L   Glucose, Bld 111 (H) 70 - 99 mg/dL   BUN 20 8 - 23 mg/dL   Creatinine, Ser 0.56 0.44 - 1.00 mg/dL   Calcium 9.2 8.9 - 10.3 mg/dL   Total Protein 7.1 6.5 - 8.1 g/dL   Albumin 3.9 3.5 - 5.0 g/dL   AST 22 15 - 41 U/L   ALT 15 0 - 44 U/L    Alkaline Phosphatase 59 38 - 126 U/L   Total Bilirubin 1.1 0.3 - 1.2 mg/dL   GFR, Estimated >60 >60 mL/min   Anion gap 5 5 - 15  Troponin I (High Sensitivity)     Status: None   Collection Time: 12/05/20 12:05 PM  Result Value Ref Range   Troponin I (High Sensitivity) 7 <18 ng/L  Protime-INR     Status: None   Collection Time: 12/05/20 12:05 PM  Result Value Ref Range   Prothrombin Time 12.8 11.4 - 15.2 seconds   INR 1.0 0.8 - 1.2  APTT     Status: None   Collection Time: 12/05/20 12:05 PM  Result Value Ref Range   aPTT 25 24 - 36 seconds  Troponin I (High Sensitivity)     Status: None   Collection Time: 12/05/20 12:05 PM  Result Value Ref Range   Troponin I (High Sensitivity) 6 <18 ng/L  Urinalysis, Complete w Microscopic Urine, Clean Catch     Status: Abnormal   Collection Time: 12/05/20  1:44 PM  Result Value Ref Range   Color, Urine YELLOW (A) YELLOW   APPearance CLOUDY (A) CLEAR   Specific Gravity, Urine 1.009 1.005 - 1.030   pH 8.0 5.0 - 8.0   Glucose, UA NEGATIVE NEGATIVE mg/dL   Hgb urine dipstick SMALL (A) NEGATIVE   Bilirubin Urine NEGATIVE NEGATIVE   Ketones, ur NEGATIVE NEGATIVE mg/dL   Protein, ur NEGATIVE NEGATIVE mg/dL   Nitrite NEGATIVE NEGATIVE   Leukocytes,Ua NEGATIVE NEGATIVE   RBC / HPF 0-5 0 - 5 RBC/hpf   WBC, UA 0-5 0 - 5 WBC/hpf   Bacteria, UA RARE (A) NONE SEEN   Squamous Epithelial / LPF 0-5 0 - 5   Amorphous Crystal PRESENT    ____________________________________________  EKG My review and personal interpretation at Time: 10:16   Indication: nausea  Rate: 75  Rhythm: sinus Axis: normal Other: normal intervals, no stemi ____________________________________________  RADIOLOGY  I  personally reviewed all radiographic images ordered to evaluate for the above acute complaints and reviewed radiology reports and findings.  These findings were personally discussed with the patient.  Please see medical record for radiology  report.  ____________________________________________   PROCEDURES  Procedure(s) performed:  .Critical Care Performed by: Merlyn Lot, MD Authorized by: Merlyn Lot, MD   Critical care provider statement:    Critical care time (minutes):  15   Critical care time was exclusive of:  Separately billable procedures and treating other patients   Critical care was necessary to treat or prevent imminent or life-threatening deterioration of the following conditions:  Metabolic crisis   Critical care was time spent personally by me on the following activities:  Development of treatment plan with patient or surrogate, discussions with consultants, evaluation of patient's response to treatment, examination of patient, obtaining history from patient or surrogate, ordering and performing treatments and interventions, ordering and review of laboratory studies, ordering and review of radiographic studies, pulse oximetry, re-evaluation of patient's condition and review of old charts      Critical Care performed: no ____________________________________________   INITIAL IMPRESSION / ASSESSMENT AND PLAN / ED COURSE  Pertinent labs & imaging results that were available during my care of the patient were reviewed by me and considered in my medical decision making (see chart for details).   DDX: dysrhythmia, acs, chf, pna, sah, iph, sdh, seizure  Ashley [name] is a 87 y.o. who presents to the ED with presentation as described above.  Patient nontoxic-appearing.  Did strike her head and after what sounds like a syncopal event.  Neuro exam is reassuring.  CT imaging blood work radiographs will be ordered for the above differential.  Clinical Course as of 12/05/20 1442  Thu Dec 05, 2020  1120 Patient with evidence of small subarachnoid hemorrhage.  I consulted with Dr. Cari Caraway of neurosurgery given her history of aneurysm and unreviewed not felt to be related to aneurysm.  She is not on  anticoagulation.  Plan will be for observation here in the ER for repeat CT head. [PR]  1326 Patient reassessed.  Remains neuro intact.  No acute distress. [PR]  1430 Patient is hyponatremic will give additional IV hydration.  This certainly would explain the patient's syncopal event.  Patient will be signed out to oncoming physician pending repeat CT head and if that is stable will be appropriate for admission for medical work-up. [PR]    Clinical Course User Index [PR] Merlyn Lot, MD    The patient was evaluated in Emergency Department today for the symptoms described in the history of present illness. He/she was evaluated in the context of the global COVID-19 pandemic, which necessitated consideration that the patient might be at risk for infection with the SARS-CoV-2 virus that causes COVID-19. Institutional protocols and algorithms that pertain to the evaluation of patients at risk for COVID-19 are in a state of rapid change based on information released by regulatory bodies including the CDC and federal and state organizations. These policies and algorithms were followed during the patient's care in the ED.  As part of my medical decision making, I reviewed the following data within the [place] notes reviewed and incorporated, Labs reviewed, notes from prior ED visits and [place] Controlled Substance Database   ____________________________________________   FINAL CLINICAL IMPRESSION(S) / ED DIAGNOSES  Final diagnoses:  Fall, initial encounter  Acute hyponatremia  SAH (subarachnoid hemorrhage) ([place])      NEW MEDICATIONS STARTED  DURING THIS VISIT:  New Prescriptions   No medications on file     Note:  This document was prepared using Dragon voice recognition software and may include unintentional dictation errors.       Merlyn Lot, MD 12/05/20 [phone]

## 2020-12-06 DIAGNOSIS — Z87891 Personal history of nicotine dependence: Secondary | ICD-10-CM | POA: Diagnosis not present

## 2020-12-06 DIAGNOSIS — F05 Delirium due to known physiological condition: Secondary | ICD-10-CM | POA: Diagnosis present

## 2020-12-06 DIAGNOSIS — W1830XA Fall on same level, unspecified, initial encounter: Secondary | ICD-10-CM | POA: Diagnosis present

## 2020-12-06 DIAGNOSIS — F32A Depression, unspecified: Secondary | ICD-10-CM | POA: Diagnosis present

## 2020-12-06 DIAGNOSIS — W19XXXA Unspecified fall, initial encounter: Secondary | ICD-10-CM | POA: Diagnosis not present

## 2020-12-06 DIAGNOSIS — F419 Anxiety disorder, unspecified: Secondary | ICD-10-CM | POA: Diagnosis present

## 2020-12-06 DIAGNOSIS — I5032 Chronic diastolic (congestive) heart failure: Secondary | ICD-10-CM | POA: Diagnosis present

## 2020-12-06 DIAGNOSIS — Z79899 Other long term (current) drug therapy: Secondary | ICD-10-CM | POA: Diagnosis not present

## 2020-12-06 DIAGNOSIS — I110 Hypertensive heart disease with heart failure: Secondary | ICD-10-CM | POA: Diagnosis present

## 2020-12-06 DIAGNOSIS — Z7982 Long term (current) use of aspirin: Secondary | ICD-10-CM | POA: Diagnosis not present

## 2020-12-06 DIAGNOSIS — E785 Hyperlipidemia, unspecified: Secondary | ICD-10-CM | POA: Diagnosis present

## 2020-12-06 DIAGNOSIS — E871 Hypo-osmolality and hyponatremia: Secondary | ICD-10-CM | POA: Diagnosis present

## 2020-12-06 DIAGNOSIS — Z66 Do not resuscitate: Secondary | ICD-10-CM | POA: Diagnosis present

## 2020-12-06 DIAGNOSIS — Z8673 Personal history of transient ischemic attack (TIA), and cerebral infarction without residual deficits: Secondary | ICD-10-CM | POA: Diagnosis not present

## 2020-12-06 DIAGNOSIS — Z955 Presence of coronary angioplasty implant and graft: Secondary | ICD-10-CM | POA: Diagnosis not present

## 2020-12-06 DIAGNOSIS — I609 Nontraumatic subarachnoid hemorrhage, unspecified: Secondary | ICD-10-CM

## 2020-12-06 DIAGNOSIS — E875 Hyperkalemia: Secondary | ICD-10-CM | POA: Diagnosis not present

## 2020-12-06 DIAGNOSIS — I2510 Atherosclerotic heart disease of native coronary artery without angina pectoris: Secondary | ICD-10-CM | POA: Diagnosis present

## 2020-12-06 DIAGNOSIS — I671 Cerebral aneurysm, nonruptured: Secondary | ICD-10-CM | POA: Diagnosis present

## 2020-12-06 DIAGNOSIS — I959 Hypotension, unspecified: Secondary | ICD-10-CM | POA: Diagnosis present

## 2020-12-06 DIAGNOSIS — Z20822 Contact with and (suspected) exposure to covid-19: Secondary | ICD-10-CM | POA: Diagnosis present

## 2020-12-06 DIAGNOSIS — Z6829 Body mass index (BMI) 29.0-29.9, adult: Secondary | ICD-10-CM | POA: Diagnosis not present

## 2020-12-06 DIAGNOSIS — R451 Restlessness and agitation: Secondary | ICD-10-CM | POA: Diagnosis present

## 2020-12-06 DIAGNOSIS — J449 Chronic obstructive pulmonary disease, unspecified: Secondary | ICD-10-CM | POA: Diagnosis present

## 2020-12-06 DIAGNOSIS — Z96641 Presence of right artificial hip joint: Secondary | ICD-10-CM | POA: Diagnosis present

## 2020-12-06 DIAGNOSIS — E669 Obesity, unspecified: Secondary | ICD-10-CM | POA: Diagnosis present

## 2020-12-06 DIAGNOSIS — S066X9A Traumatic subarachnoid hemorrhage with loss of consciousness of unspecified duration, initial encounter: Secondary | ICD-10-CM | POA: Diagnosis present

## 2020-12-06 LAB — CBC
HCT: 45 % (ref 36.0–46.0)
Hemoglobin: 15 g/dL (ref 12.0–15.0)
MCH: 28.8 pg (ref 26.0–34.0)
MCHC: 33.3 g/dL (ref 30.0–36.0)
MCV: 86.4 fL (ref 80.0–100.0)
Platelets: 226 10*3/uL (ref 150–400)
RBC: 5.21 MIL/uL — ABNORMAL HIGH (ref 3.87–5.11)
RDW: 13.2 % (ref 11.5–15.5)
WBC: 8.3 10*3/uL (ref 4.0–10.5)
nRBC: 0 % (ref 0.0–0.2)

## 2020-12-06 LAB — SODIUM, URINE, RANDOM: Sodium, Ur: 27 mmol/L

## 2020-12-06 LAB — BASIC METABOLIC PANEL
Anion gap: 9 (ref 5–15)
Anion gap: 9 (ref 5–15)
Anion gap: 9 (ref 5–15)
BUN: 27 mg/dL — ABNORMAL HIGH (ref 8–23)
BUN: 36 mg/dL — ABNORMAL HIGH (ref 8–23)
BUN: 38 mg/dL — ABNORMAL HIGH (ref 8–23)
CO2: 24 mmol/L (ref 22–32)
CO2: 21 mmol/L — ABNORMAL LOW (ref 22–32)
CO2: 24 mmol/L (ref 22–32)
Calcium: 9.4 mg/dL (ref 8.9–10.3)
Calcium: 9.1 mg/dL (ref 8.9–10.3)
Calcium: 9.3 mg/dL (ref 8.9–10.3)
Chloride: 93 mmol/L — ABNORMAL LOW (ref 98–111)
Chloride: 92 mmol/L — ABNORMAL LOW (ref 98–111)
Chloride: 91 mmol/L — ABNORMAL LOW (ref 98–111)
Creatinine, Ser: 0.87 mg/dL (ref 0.44–1.00)
Creatinine, Ser: 0.84 mg/dL (ref 0.44–1.00)
Creatinine, Ser: 0.71 mg/dL (ref 0.44–1.00)
GFR, Estimated: >60 mL/min (ref >60)
GFR, Estimated: >60 mL/min (ref >60)
GFR, Estimated: >60 mL/min (ref >60)
Glucose, Bld: 108 mg/dL — ABNORMAL HIGH (ref 70–99)
Glucose, Bld: 147 mg/dL — ABNORMAL HIGH (ref 70–99)
Glucose, Bld: 108 mg/dL — ABNORMAL HIGH (ref 70–99)
Potassium: 4.2 mmol/L (ref 3.5–5.1)
Potassium: 4.5 mmol/L (ref 3.5–5.1)
Potassium: 4.6 mmol/L (ref 3.5–5.1)
Sodium: 126 mmol/L — ABNORMAL LOW (ref 135–145)
Sodium: 122 mmol/L — ABNORMAL LOW (ref 135–145)
Sodium: 124 mmol/L — ABNORMAL LOW (ref 135–145)

## 2020-12-06 LAB — URINALYSIS, COMPLETE (UACMP) WITH MICROSCOPIC
Bacteria, UA: NONE SEEN
Bilirubin Urine: NEGATIVE
Glucose, UA: NEGATIVE mg/dL
Ketones, ur: 5 mg/dL — AB
Leukocytes,Ua: NEGATIVE
Nitrite: NEGATIVE
Protein, ur: NEGATIVE mg/dL
Specific Gravity, Urine: 1.019 (ref 1.005–1.030)
pH: 5 (ref 5.0–8.0)

## 2020-12-06 LAB — OSMOLALITY, URINE: Osmolality, Ur: 607 mOsm/kg (ref 300–900)

## 2020-12-06 MED ORDER — ADULT MULTIVITAMIN W/MINERALS CH
1.0000 | ORAL_TABLET | Freq: Every day | ORAL | Status: DC
  Administered 2020-12-07 – 2020-12-13 (×7): 1 via ORAL
  Filled 2020-12-06 (×7): qty 1

## 2020-12-06 MED ORDER — POLYETHYLENE GLYCOL 3350 17 G PO PACK: 17.0000 g | PACK | Freq: Every day | ORAL | Status: DC

## 2020-12-06 MED ORDER — POLYETHYLENE GLYCOL 3350 17 G PO PACK
17.0000 g | PACK | Freq: Every day | ORAL | Status: DC
  Administered 2020-12-07 – 2020-12-13 (×6): 17 g via ORAL
  Filled 2020-12-06 (×6): qty 1

## 2020-12-06 MED ORDER — ENSURE ENLIVE PO LIQD
237.0000 mL | Freq: Two times a day (BID) | ORAL | Status: DC
  Administered 2020-12-07 – 2020-12-13 (×10): 237 mL via ORAL

## 2020-12-06 NOTE — Progress Notes (Signed)
Patient's son at the bedside declines the low bed. Explained that the low bed was ordered due to the fact that the patient came in following a fall. Patient's son states that the patient will not attempt to get up without assistance. He doesn't want her to be moved at this time he refuses the low bed.

## 2020-12-06 NOTE — Progress Notes (Signed)
Initial Nutrition Assessment  DOCUMENTATION CODES:   Obesity unspecified  INTERVENTION:   Ensure Enlive po BID, each supplement provides 350 kcal and 20 grams of protein  MVI po daily   Assist with meals   NUTRITION DIAGNOSIS:   Increased nutrient needs related to catabolic illness (COPD) as evidenced by estimated needs.  GOAL:   Patient will meet greater than or equal to 90% of their needs   MONITOR:   PO intake,Supplement acceptance,Labs,Weight trends,Skin,I & O's  REASON FOR ASSESSMENT:   Malnutrition Screening Tool    ASSESSMENT:   87 y.o. female with medical history significant for hypertension, hyperlipidemia, CAD, COPD, depression and stroke who is admitted with SAH.   Met with pt in room today. Pt reports good appetite and oral intake. Pt reports that she ate well today. Pt reports that she requires assistance with eating her meals as she has weakness in her arm. Pt reports that her son helped her to eat today. Pt is documented to have eaten 90% of her lunch. Pt reports that she enjoys strawberry Ensure. RD will add supplements and MVI to help pt meet her estimated needs. Per chart, pt appears weight stable at baseline.   Medications reviewed and include: ferrous sulfate, protonix, aldactone, B12  Labs reviewed: Na 122(L), BUN 36(H)  NUTRITION - FOCUSED PHYSICAL EXAM:  Flowsheet Row Most Recent Value  Orbital Region No depletion  Upper Arm Region No depletion  Thoracic and Lumbar Region No depletion  Buccal Region No depletion  Temple Region No depletion  Clavicle Bone Region No depletion  Clavicle and Acromion Bone Region No depletion  Scapular Bone Region No depletion  Dorsal Hand No depletion  Patellar Region No depletion  Anterior Thigh Region No depletion  Posterior Calf Region No depletion  Edema (RD Assessment) None  Hair Reviewed  Eyes Reviewed  Mouth Reviewed  Skin Reviewed  Nails Reviewed     Diet Order:   Diet Order            Diet  regular Room service appropriate? Yes; Fluid consistency: Thin  Diet effective now                EDUCATION NEEDS:   Education needs have been addressed  Skin:  Skin Assessment: Reviewed RN Assessment  Last BM:  pta  Height:   Ht Readings from Last 1 Encounters:  12/05/20 5' 4"  (1.626 m)    Weight:   Wt Readings from Last 1 Encounters:  12/05/20 77.6 kg    Ideal Body Weight:  54.5 kg  BMI:  Body mass index is 29.37 kg/m.  Estimated Nutritional Needs:   Kcal:  1600-1800kcal/day  Protein:  80-90g/day  Fluid:  1.6L/day  Koleen Distance MS, RD, LDN Please refer to [facility] for RD and/or RD on-call/weekend/after hours pager

## 2020-12-06 NOTE — Plan of Care (Signed)

## 2020-12-06 NOTE — Progress Notes (Signed)
PROGRESS NOTE    Ashley [name]  GYJ:[id number] DOB: 02-Dec-1930 DOA: 12/05/2020 PCP: Ashley Billet, MD   Brief Narrative: Taken from H&P. Ashley [name] is a 87 y.o. female with medical history significant for hypertension, hyperlipidemia, CAD, COPD, depression, history of stroke presents to the ED for chief concern of falling with head trauma and loss of consciousness.  She reports that a doorbell ring as she was getting up from her chair to answer the door and the next thing she remembers she woke up and she was laying on her back on the floor.  She denies presyncopal feelings of chest pain, shortness of breath, dizziness.  She endorses that in the last couple of months she has been feeling very weak.  She notes that Zoloft was started a few months ago and since then she has felt very lethargic and weak.  CT head concerning for subarachnoid hemorrhage, repeat CT with stable changes.  Neurosurgery recommending conservative management. Also found to have hyponatremia.  Subjective: Patient denies any new complaint.  No headache or change in vision.  No other focal deficit.  Feeling overall weak.  Son at bedside.  Assessment & Plan:   Active Problems:   Subarachnoid hematoma (HCC)   Subarachnoid hemorrhage (HCC)  Subarachnoid hemorrhage. Neurosurgery was consulted and they are suspecting secondary to trauma and not due to aneurysmal rupture. Repeat CT head was negative for any progression. No focal deficit. Per neurosurgery there is no need for any further follow-up. -Keep holding aspirin for another 5 days.  Hyponatremia. Patient was recently started on Zoloft and experiencing generalized weakness since then. Also endorsed low-sodium diet. No change in sodium with IV normal saline. -Check hyponatremia labs. -Discontinue Zoloft -Monitor sodium  Generalized weakness. Might be secondary to hyponatremia. -PT/OT evaluation  Chronic HFpEF. Patient appears euvolemic. Echocardiogram  done in September 2021 with grade 1 diastolic dysfunction. -Continue home dose of lisinopril, metoprolol and spironolactone.  Essential hypertension. Blood pressure within goal -Continue home dose of amlodipine, isosorbide dinitrate, lisinopril, metoprolol and spironolactone.  History of anxiety/depression. No acute concern Discontinue home dose of Zoloft due to hyponatremia. Continue home dose of alprazolam as needed.  Objective: Vitals:   12/06/20 0622 12/06/20 0730 12/06/20 0855 12/06/20 0857  BP: 133/69 136/70 139/68 139/68  Pulse: 80 80 74 70  Resp: 18 (!) 23 18 18   Temp:  (!) 97.4 F (36.3 C) 98.1 F (36.7 C) 98.1 F (36.7 C)  TempSrc:  Oral Oral   SpO2: 95% 94% 94% 95%  Weight:      Height:        Intake/Output Summary (Last 24 hours) at 12/06/2020 1348 Last data filed at 12/06/2020 1344 Gross per 24 hour  Intake 120 ml  Output -  Net 120 ml   Filed Weights   12/05/20 0952  Weight: 77.6 kg    Examination:  General exam: Appears calm and comfortable  Respiratory system: Clear to auscultation. Respiratory effort normal. Cardiovascular system: S1 & S2 heard, RRR. Gastrointestinal system: Soft, nontender, nondistended, bowel sounds positive. Central nervous system: Alert and oriented. No focal neurological deficits.Symmetric 5 x 5 power. Extremities: No edema, no cyanosis, pulses intact and symmetrical. Psychiatry: Judgement and insight appear normal. Mood & affect appropriate.    DVT prophylaxis: SCDs Code Status: DNR Family Communication: Son was updated at bedside Disposition Plan:  Status is: Inpatient  Remains inpatient appropriate because:Inpatient level of care appropriate due to severity of illness   Dispo: The patient is from: Home  Anticipated d/c is to: Home              Anticipated d/c date is: 1 day              Patient currently is not medically stable to d/c.   Difficult to place patient No              Level of care:  Progressive Cardiac  Consultants:   Neurosurgery  Procedures:  Antimicrobials:   Data Reviewed: I have personally reviewed following labs and imaging studies  CBC: Recent Labs  Lab 12/05/20 1205 12/06/20 0338  WBC 13.1* 8.3  NEUTROABS 11.4*  --   HGB 15.0 15.0  HCT 45.6 45.0  MCV 86.7 86.4  PLT 231 416   Basic Metabolic Panel: Recent Labs  Lab 12/05/20 1205 12/05/20 2148 12/06/20 0338  NA 126* 127* 126*  K 4.4 4.3 4.2  CL 94* 93* 93*  CO2 27 24 24   GLUCOSE 111* 106* 108*  BUN 20 18 27*  CREATININE 0.56 0.59 0.87  CALCIUM 9.2 9.4 9.4   GFR: Estimated Creatinine Clearance: 46 mL/min (by C-G formula based on SCr of 0.87 mg/dL). Liver Function Tests: Recent Labs  Lab 12/05/20 1205  AST 22  ALT 15  ALKPHOS 59  BILITOT 1.1  PROT 7.1  ALBUMIN 3.9   No results for input(s): LIPASE, AMYLASE in the last 168 hours. No results for input(s): AMMONIA in the last 168 hours. Coagulation Profile: Recent Labs  Lab 12/05/20 1205  INR 1.0   Cardiac Enzymes: No results for input(s): CKTOTAL, CKMB, CKMBINDEX, TROPONINI in the last 168 hours. BNP (last 3 results) No results for input(s): PROBNP in the last 8760 hours. HbA1C: No results for input(s): HGBA1C in the last 72 hours. CBG: No results for input(s): GLUCAP in the last 168 hours. Lipid Profile: No results for input(s): CHOL, HDL, LDLCALC, TRIG, CHOLHDL, LDLDIRECT in the last 72 hours. Thyroid Function Tests: Recent Labs    12/05/20 2148  TSH 1.674   Anemia Panel: No results for input(s): VITAMINB12, FOLATE, FERRITIN, TIBC, IRON, RETICCTPCT in the last 72 hours. Sepsis Labs: No results for input(s): PROCALCITON, LATICACIDVEN in the last 168 hours.  Recent Results (from the past 240 hour(s))  Resp Panel by RT-PCR (Flu A&B, Covid) Nasopharyngeal Swab     Status: None   Collection Time: 12/05/20  6:59 PM   Specimen: Nasopharyngeal Swab; Nasopharyngeal(NP) swabs in vial transport medium  Result Value Ref  Range Status   SARS Coronavirus 2 by RT PCR NEGATIVE NEGATIVE Final    Comment: (NOTE) SARS-CoV-2 target nucleic acids are NOT DETECTED.  The SARS-CoV-2 RNA is generally detectable in upper respiratory specimens during the acute phase of infection. The lowest concentration of SARS-CoV-2 viral copies this assay can detect is 138 copies/mL. A negative result does not preclude SARS-Cov-2 infection and should not be used as the sole basis for treatment or other patient management decisions. A negative result may occur with  improper specimen collection/handling, submission of specimen other than nasopharyngeal swab, presence of viral mutation(s) within the areas targeted by this assay, and inadequate number of viral copies(<138 copies/mL). A negative result must be combined with clinical observations, patient history, and epidemiological information. The expected result is Negative.  Fact Sheet for Patients:  EntrepreneurPulse.com.au  Fact Sheet for Healthcare Providers:  IncredibleEmployment.be  This test is no t yet approved or cleared by the [place] FDA and  has been authorized for detection and/or diagnosis of SARS-CoV-2  by FDA under an Emergency Use Authorization (EUA). This EUA will remain  in effect (meaning this test can be used) for the duration of the COVID-19 declaration under Section 564(b)(1) of the Act, 21 U.S.C.section 360bbb-3(b)(1), unless the authorization is terminated  or revoked sooner.       Influenza A by PCR NEGATIVE NEGATIVE Final   Influenza B by PCR NEGATIVE NEGATIVE Final    Comment: (NOTE) The Xpert Xpress SARS-CoV-2/FLU/RSV plus assay is intended as an aid in the diagnosis of influenza from Nasopharyngeal swab specimens and should not be used as a sole basis for treatment. Nasal washings and aspirates are unacceptable for Xpert Xpress SARS-CoV-2/FLU/RSV testing.  Fact Sheet for  Patients: EntrepreneurPulse.com.au  Fact Sheet for Healthcare Providers: IncredibleEmployment.be  This test is not yet approved or cleared by the [place] FDA and has been authorized for detection and/or diagnosis of SARS-CoV-2 by FDA under an Emergency Use Authorization (EUA). This EUA will remain in effect (meaning this test can be used) for the duration of the COVID-19 declaration under Section 564(b)(1) of the Act, 21 U.S.C. section 360bbb-3(b)(1), unless the authorization is terminated or revoked.  Performed at [facility], [address], [place], [place] [address]      Radiology Studies: CT Head Wo Contrast  Result Date: 12/05/2020 CLINICAL DATA:  Head trauma.  6 hour repeat study. EXAM: CT HEAD WITHOUT CONTRAST TECHNIQUE: Contiguous axial images were obtained from the base of the skull through the vertex without intravenous contrast. COMPARISON:  CT head from the same day. FINDINGS: Brain: Similar bifrontal subarachnoid hemorrhage. Slight increase in volume/conspicuity of subarachnoid hemorrhage along the left posterior frontal convexity (series 2, image 22). Similar possible small hypodense subdural collections, as described on the prior. No evidence of acute large vascular territory infarct. Similar remote right occipital lobe infarct with encephalomalacia. Similar patchy white matter hypoattenuation, likely related to chronic microvascular ischemic change. Similar 6 mm calcified mass along the inner table of the right frontal bone, likely representing a meningioma without substantial mass effect. Basal cisterns are patent. Vascular: Calcific atherosclerosis. Skull: No acute fracture.  Left posterior scalp contusion. Sinuses/Orbits: Visualized sinuses are clear.  Unremarkable orbits. Other: No mastoid effusions. IMPRESSION: 1. Similar bifrontal subarachnoid hemorrhage. Slight increase in volume/conspicuity of subarachnoid hemorrhage  along the left posterior frontal convexity, which could represent redistribution of hemorrhage or a small volume of new hemorrhage. No substantial mass effect. 2. Similar suspected low density subdural collections, as described on the prior. 3. Remote right occipital infarct and moderate chronic microvascular ischemic change. Electronically Signed   By: Margaretha Sheffield MD   On: 12/05/2020 17:04   CT Head Wo Contrast  Result Date: 12/05/2020 CLINICAL DATA:  Head trauma Found on floor this morning EXAM: CT HEAD WITHOUT CONTRAST TECHNIQUE: Contiguous axial images were obtained from the base of the skull through the vertex without intravenous contrast. COMPARISON:  07/14/2020 FINDINGS: Brain: Diffuse cortical atrophy. Chronic encephalomalacia of the right occipital lobe consistent with remote infarct. Confluent periventricular white matter hypodensity consistent with chronic ischemic small vessel disease. Subarachnoid hemorrhage noted in the frontal lobes bilaterally. Multiple foci of acute subarachnoid hemorrhage noted overlying the left frontal and parietal lobes. Findings are suspicious for bilateral hypodense subdural fluid collections which have increased since the prior examination. This may be due to chronic subdural hematomas or hygromas. No midline shift. 6 mm calcified mass originating from the inner table of the right frontal bone is unchanged from prior examination and likely related to  a meningioma. No acute infarct. Vascular: No hyperdense vessel or unexpected calcification. Skull: Normal. Negative for fracture or focal lesion. Sinuses/Orbits: No acute finding. Other: Mild hematoma of the left posterior scalp. IMPRESSION: 1. Acute subarachnoid hemorrhage involving the anterior frontal lobes bilaterally. 2. Acute subarachnoid hematoma overlying the left frontal and parietal lobes. 3. Interval increase of low-density subdural collections overlying both cerebral hemispheres suspicious for chronic  subdural hematomas or hygromas. These results were called by telephone at the time of interpretation on 12/05/2020 at 10:52 am to provider Merlyn Lot , who verbally acknowledged these results. Electronically Signed   By: Miachel Roux M.D.   On: 12/05/2020 10:52   CT Cervical Spine Wo Contrast  Result Date: 12/05/2020 CLINICAL DATA:  Neck trauma.  Fall. EXAM: CT CERVICAL SPINE WITHOUT CONTRAST TECHNIQUE: Multidetector CT imaging of the cervical spine was performed without intravenous contrast. Multiplanar CT image reconstructions were also generated. COMPARISON:  None. FINDINGS: Alignment: Within normal limits Skull base and vertebrae: No acute fracture. No primary bone lesion or focal pathologic process. Soft tissues and spinal canal: No prevertebral fluid or swelling. No visible canal hematoma. Disc levels: No significant disc height loss. Vertebral body heights are maintained. Mild degenerative changes seen throughout the cervical spine. Upper chest: Negative. Other: None. IMPRESSION: No acute abnormality of the cervical or visualized upper thoracic spine. Electronically Signed   By: Miachel Roux M.D.   On: 12/05/2020 10:59   DG Chest Portable 1 View  Result Date: 12/05/2020 CLINICAL DATA:  Weakness.  Unwitnessed fall EXAM: PORTABLE CHEST 1 VIEW COMPARISON:  July 24, 2020 FINDINGS: There is cardiomegaly with pulmonary vascularity within normal limits. No edema or airspace opacity. There is an apparent hiatal hernia. There is aortic atherosclerosis. No adenopathy. No bone lesions. IMPRESSION: No edema or airspace opacity. Cardiomegaly. Apparent hiatal hernia. Aortic Atherosclerosis (ICD10-I70.0). Electronically Signed   By: Lowella Grip III M.D.   On: 12/05/2020 10:30    Scheduled Meds: . amLODipine  10 mg Oral Daily  . ferrous sulfate  325 mg Oral Q M,W,F  . Ipratropium-Albuterol  1 puff Inhalation QID  . isosorbide dinitrate  30 mg Oral BID  . lisinopril  10 mg Oral Daily  .  metoprolol succinate  75 mg Oral Daily  . pantoprazole  40 mg Oral BID  . simvastatin  40 mg Oral QHS  . sodium chloride flush  10-40 mL Intracatheter Q12H  . spironolactone  25 mg Oral Daily  . vitamin B-12  1,000 mcg Oral Daily   Continuous Infusions:   LOS: 0 days   Time spent: 40 minutes.  Lorella Nimrod, MD [facility]  If 7PM-7AM, please contact night-coverage [email or web address]  12/06/2020, 1:48 PM   This record has been created using Systems analyst. Errors have been sought and corrected,but may not always be located. Such creation errors do not reflect on the standard of care.

## 2020-12-06 NOTE — ED Notes (Signed)
This RN to bedside due to patient calling out. Pt states "the first thing I did this morning was pray for y'all". Pt covered with blankets at this time per her request. Pt denies further needs at this time. Call bell within reach of patient at this time.

## 2020-12-07 ENCOUNTER — Inpatient Hospital Stay: Payer: Medicare Other

## 2020-12-07 DIAGNOSIS — I609 Nontraumatic subarachnoid hemorrhage, unspecified: Secondary | ICD-10-CM | POA: Diagnosis not present

## 2020-12-07 DIAGNOSIS — W19XXXA Unspecified fall, initial encounter: Secondary | ICD-10-CM | POA: Diagnosis not present

## 2020-12-07 DIAGNOSIS — E871 Hypo-osmolality and hyponatremia: Secondary | ICD-10-CM | POA: Diagnosis not present

## 2020-12-07 LAB — CBC
HCT: 45.9 % (ref 36.0–46.0)
Hemoglobin: 15.7 g/dL — ABNORMAL HIGH (ref 12.0–15.0)
MCH: 29.2 pg (ref 26.0–34.0)
MCHC: 34.2 g/dL (ref 30.0–36.0)
MCV: 85.5 fL (ref 80.0–100.0)
Platelets: 238 10*3/uL (ref 150–400)
RBC: 5.37 MIL/uL — ABNORMAL HIGH (ref 3.87–5.11)
RDW: 13.2 % (ref 11.5–15.5)
WBC: 8 10*3/uL (ref 4.0–10.5)
nRBC: 0 % (ref 0.0–0.2)

## 2020-12-07 LAB — BASIC METABOLIC PANEL
Anion gap: 11 (ref 5–15)
Anion gap: 9 (ref 5–15)
BUN: 24 mg/dL — ABNORMAL HIGH (ref 8–23)
BUN: 24 mg/dL — ABNORMAL HIGH (ref 8–23)
CO2: 23 mmol/L (ref 22–32)
CO2: 23 mmol/L (ref 22–32)
Calcium: 9.4 mg/dL (ref 8.9–10.3)
Calcium: 9.2 mg/dL (ref 8.9–10.3)
Chloride: 90 mmol/L — ABNORMAL LOW (ref 98–111)
Chloride: 92 mmol/L — ABNORMAL LOW (ref 98–111)
Creatinine, Ser: 0.5 mg/dL (ref 0.44–1.00)
Creatinine, Ser: 0.64 mg/dL (ref 0.44–1.00)
GFR, Estimated: >60 mL/min (ref >60)
GFR, Estimated: >60 mL/min (ref >60)
Glucose, Bld: 122 mg/dL — ABNORMAL HIGH (ref 70–99)
Glucose, Bld: 107 mg/dL — ABNORMAL HIGH (ref 70–99)
Potassium: 4.5 mmol/L (ref 3.5–5.1)
Potassium: 5 mmol/L (ref 3.5–5.1)
Sodium: 124 mmol/L — ABNORMAL LOW (ref 135–145)
Sodium: 124 mmol/L — ABNORMAL LOW (ref 135–145)

## 2020-12-07 LAB — CORTISOL-AM, BLOOD: Cortisol - AM: 22.7 ug/dL — ABNORMAL HIGH (ref 6.7–22.6)

## 2020-12-07 MED ORDER — SODIUM CHLORIDE 0.9 % IV SOLN: INTRAVENOUS | Status: AC

## 2020-12-07 NOTE — Progress Notes (Signed)
PROGRESS NOTE    Ashley [name]  NKN:[id number] DOB: May 14, 1931 DOA: 12/05/2020 PCP: Albina Billet, MD   Brief Narrative: Taken from H&P. Ashley [name] is a 87 y.o. female with medical history significant for hypertension, hyperlipidemia, CAD, COPD, depression, history of stroke presents to the ED for chief concern of falling with head trauma and loss of consciousness.  She reports that a doorbell ring as she was getting up from her chair to answer the door and the next thing she remembers she woke up and she was laying on her back on the floor.  She denies presyncopal feelings of chest pain, shortness of breath, dizziness.  She endorses that in the last couple of months she has been feeling very weak.  She notes that Zoloft was started a few months ago and since then she has felt very lethargic and weak.  CT head concerning for subarachnoid hemorrhage, repeat CT with stable changes.  Neurosurgery recommending conservative management. Also found to have hyponatremia.  Repeated another CT today when she was becoming more agitated and it shows resolution of prior findings and no new abnormality.  Subjective: Patient becomes quite agitated and screaming earlier in the morning.  Ordered repeat CT. saw patient when she was coming back from CT scan and she was nauseated and throwing up. Son at bedside and according to him his mom always gets nauseated whenever she lays flat, as that was required for CT  Assessment & Plan:   Active Problems:   Subarachnoid hematoma (HCC)   SAH (subarachnoid hemorrhage) (HCC)   Fall   Acute hyponatremia  Subarachnoid hemorrhage. Neurosurgery was consulted and they are suspecting secondary to trauma and not due to aneurysmal rupture. Repeat CT head was negative for any progression. No focal deficit. Per neurosurgery there is no need for any further follow-up. Repeat CT head today when she become more agitated was negative for any new abnormality, shows  rather some resolution of prior bleeding. -Keep holding aspirin for another 5 days.  Hyponatremia. Patient was recently started on Zoloft and experiencing generalized weakness since then. Also endorsed low-sodium diet. No change in sodium with IV normal saline. -Hyponatremia labs with low serum osmolality at 268, normal urine osmolality and urinary sodium of 27.  Most likely secondary to Zoloft. -Discontinue Zoloft -Monitor sodium  Generalized weakness. Might be secondary to hyponatremia. -PT/OT evaluation  Chronic HFpEF. Patient appears euvolemic. Echocardiogram done in September 2021 with grade 1 diastolic dysfunction. -Continue home dose of lisinopril, metoprolol and spironolactone.  Essential hypertension. Blood pressure within goal -Continue home dose of amlodipine, isosorbide dinitrate, lisinopril, metoprolol and spironolactone.  History of anxiety/depression. No acute concern Discontinue home dose of Zoloft due to hyponatremia. Continue home dose of alprazolam as needed.  Objective: Vitals:   12/06/20 1949 12/07/20 0603 12/07/20 0718 12/07/20 1136  BP: 136/68 (!) 159/80 (!) 136/95 136/65  Pulse: 76 88 (!) 109 76  Resp: 18 17 17 17   Temp: 97.8 F (36.6 C) 98.7 F (37.1 C) (!) 97.5 F (36.4 C) 97.8 F (36.6 C)  TempSrc: Oral  Oral   SpO2: (!) 89% 96% 98% 95%  Weight:  76.7 kg    Height:        Intake/Output Summary (Last 24 hours) at 12/07/2020 1315 Last data filed at 12/07/2020 0950 Gross per 24 hour  Intake 240 ml  Output 550 ml  Net -310 ml   Filed Weights   12/05/20 0952 12/07/20 0603  Weight: 77.6 kg 76.7 kg  Examination:  General.  Well-developed elderly lady, in no acute distress. Pulmonary.  Lungs clear bilaterally, normal respiratory effort. CV.  Regular rate and rhythm, no JVD, rub or murmur. Abdomen.  Soft, nontender, nondistended, BS positive. CNS.  Alert and oriented x3.  No focal neurologic deficit. Extremities.  No edema, no cyanosis,  pulses intact and symmetrical. Psychiatry.  Judgment and insight appears normal.   DVT prophylaxis: SCDs Code Status: DNR Family Communication: Son was updated at bedside Disposition Plan:  Status is: Inpatient  Remains inpatient appropriate because:Inpatient level of care appropriate due to severity of illness   Dispo: The patient is from: Home              Anticipated d/c is to: Home              Anticipated d/c date is: 1 day              Patient currently is not medically stable to d/c.   Difficult to place patient No              Level of care: Progressive Cardiac  Consultants:   Neurosurgery  Procedures:  Antimicrobials:   Data Reviewed: I have personally reviewed following labs and imaging studies  CBC: Recent Labs  Lab 12/05/20 1205 12/06/20 0338 12/07/20 0511  WBC 13.1* 8.3 8.0  NEUTROABS 11.4*  --   --   HGB 15.0 15.0 15.7*  HCT 45.6 45.0 45.9  MCV 86.7 86.4 85.5  PLT 231 226 937   Basic Metabolic Panel: Recent Labs  Lab 12/05/20 2148 12/06/20 0338 12/06/20 1421 12/06/20 1701 12/07/20 0511  NA 127* 126* 122* 124* 124*  K 4.3 4.2 4.5 4.6 4.5  CL 93* 93* 92* 91* 90*  CO2 24 24 21* 24 23  GLUCOSE 106* 108* 147* 108* 122*  BUN 18 27* 36* 38* 24*  CREATININE 0.59 0.87 0.84 0.71 0.50  CALCIUM 9.4 9.4 9.1 9.3 9.4   GFR: Estimated Creatinine Clearance: 49.7 mL/min (by C-G formula based on SCr of 0.5 mg/dL). Liver Function Tests: Recent Labs  Lab 12/05/20 1205  AST 22  ALT 15  ALKPHOS 59  BILITOT 1.1  PROT 7.1  ALBUMIN 3.9   No results for input(s): LIPASE, AMYLASE in the last 168 hours. No results for input(s): AMMONIA in the last 168 hours. Coagulation Profile: Recent Labs  Lab 12/05/20 1205  INR 1.0   Cardiac Enzymes: No results for input(s): CKTOTAL, CKMB, CKMBINDEX, TROPONINI in the last 168 hours. BNP (last 3 results) No results for input(s): PROBNP in the last 8760 hours. HbA1C: No results for input(s): HGBA1C in the last 72  hours. CBG: No results for input(s): GLUCAP in the last 168 hours. Lipid Profile: No results for input(s): CHOL, HDL, LDLCALC, TRIG, CHOLHDL, LDLDIRECT in the last 72 hours. Thyroid Function Tests: Recent Labs    12/05/20 2148  TSH 1.674   Anemia Panel: No results for input(s): VITAMINB12, FOLATE, FERRITIN, TIBC, IRON, RETICCTPCT in the last 72 hours. Sepsis Labs: No results for input(s): PROCALCITON, LATICACIDVEN in the last 168 hours.  Recent Results (from the past 240 hour(s))  Resp Panel by RT-PCR (Flu A&B, Covid) Nasopharyngeal Swab     Status: None   Collection Time: 12/05/20  6:59 PM   Specimen: Nasopharyngeal Swab; Nasopharyngeal(NP) swabs in vial transport medium  Result Value Ref Range Status   SARS Coronavirus 2 by RT PCR NEGATIVE NEGATIVE Final    Comment: (NOTE) SARS-CoV-2 target nucleic acids are NOT  DETECTED.  The SARS-CoV-2 RNA is generally detectable in upper respiratory specimens during the acute phase of infection. The lowest concentration of SARS-CoV-2 viral copies this assay can detect is 138 copies/mL. A negative result does not preclude SARS-Cov-2 infection and should not be used as the sole basis for treatment or other patient management decisions. A negative result may occur with  improper specimen collection/handling, submission of specimen other than nasopharyngeal swab, presence of viral mutation(s) within the areas targeted by this assay, and inadequate number of viral copies(<138 copies/mL). A negative result must be combined with clinical observations, patient history, and epidemiological information. The expected result is Negative.  Fact Sheet for Patients:  EntrepreneurPulse.com.au  Fact Sheet for Healthcare Providers:  IncredibleEmployment.be  This test is no t yet approved or cleared by the [place] FDA and  has been authorized for detection and/or diagnosis of SARS-CoV-2 by FDA under an  Emergency Use Authorization (EUA). This EUA will remain  in effect (meaning this test can be used) for the duration of the COVID-19 declaration under Section 564(b)(1) of the Act, 21 U.S.C.section 360bbb-3(b)(1), unless the authorization is terminated  or revoked sooner.       Influenza A by PCR NEGATIVE NEGATIVE Final   Influenza B by PCR NEGATIVE NEGATIVE Final    Comment: (NOTE) The Xpert Xpress SARS-CoV-2/FLU/RSV plus assay is intended as an aid in the diagnosis of influenza from Nasopharyngeal swab specimens and should not be used as a sole basis for treatment. Nasal washings and aspirates are unacceptable for Xpert Xpress SARS-CoV-2/FLU/RSV testing.  Fact Sheet for Patients: EntrepreneurPulse.com.au  Fact Sheet for Healthcare Providers: IncredibleEmployment.be  This test is not yet approved or cleared by the [place] FDA and has been authorized for detection and/or diagnosis of SARS-CoV-2 by FDA under an Emergency Use Authorization (EUA). This EUA will remain in effect (meaning this test can be used) for the duration of the COVID-19 declaration under Section 564(b)(1) of the Act, 21 U.S.C. section 360bbb-3(b)(1), unless the authorization is terminated or revoked.  Performed at [facility], [address] [place], [place] [address]      Radiology Studies: CT HEAD WO CONTRAST  Result Date: 12/07/2020 CLINICAL DATA:  87 year old female status post fall with small volume subarachnoid hemorrhage on 12/05/2020. Subsequent encounter. EXAM: CT HEAD WITHOUT CONTRAST TECHNIQUE: Contiguous axial images were obtained from the base of the skull through the vertex without intravenous contrast. COMPARISON:  Head CT 12/05/2020.  Brain MRI 07/13/2020. FINDINGS: Brain: Right PCA territory encephalomalacia from the September infarct. Small volume subarachnoid hemorrhage over the anterior superior frontal convexities and at the vertex has  regressed, with trace residual (series 3, image 20). No intraventricular hemorrhage or ventriculomegaly. No new intracranial blood products. Stable gray-white matter differentiation throughout the brain. No cortically based acute infarct identified. Vascular: Calcified atherosclerosis at the skull base. No suspicious intracranial vascular hyperdensity. Skull: Stable, intact. Sinuses/Orbits: Trace fluid in the left sphenoid appears stable and inflammatory. Other Visualized paranasal sinuses and mastoids are stable and well pneumatized. Other: No acute orbit or scalp soft tissue injury. IMPRESSION: 1. Regressed small volume posttraumatic subarachnoid hemorrhage. Trace residual. 2. No new intracranial abnormality. Right PCA territory encephalomalacia from the September infarct. Electronically Signed   By: Genevie Ann M.D.   On: 12/07/2020 10:25   CT Head Wo Contrast  Result Date: 12/05/2020 CLINICAL DATA:  Head trauma.  6 hour repeat study. EXAM: CT HEAD WITHOUT CONTRAST TECHNIQUE: Contiguous axial images were obtained from the base of  the skull through the vertex without intravenous contrast. COMPARISON:  CT head from the same day. FINDINGS: Brain: Similar bifrontal subarachnoid hemorrhage. Slight increase in volume/conspicuity of subarachnoid hemorrhage along the left posterior frontal convexity (series 2, image 22). Similar possible small hypodense subdural collections, as described on the prior. No evidence of acute large vascular territory infarct. Similar remote right occipital lobe infarct with encephalomalacia. Similar patchy white matter hypoattenuation, likely related to chronic microvascular ischemic change. Similar 6 mm calcified mass along the inner table of the right frontal bone, likely representing a meningioma without substantial mass effect. Basal cisterns are patent. Vascular: Calcific atherosclerosis. Skull: No acute fracture.  Left posterior scalp contusion. Sinuses/Orbits: Visualized sinuses are  clear.  Unremarkable orbits. Other: No mastoid effusions. IMPRESSION: 1. Similar bifrontal subarachnoid hemorrhage. Slight increase in volume/conspicuity of subarachnoid hemorrhage along the left posterior frontal convexity, which could represent redistribution of hemorrhage or a small volume of new hemorrhage. No substantial mass effect. 2. Similar suspected low density subdural collections, as described on the prior. 3. Remote right occipital infarct and moderate chronic microvascular ischemic change. Electronically Signed   By: Margaretha Sheffield MD   On: 12/05/2020 17:04    Scheduled Meds: . amLODipine  10 mg Oral Daily  . feeding supplement  237 mL Oral BID BM  . ferrous sulfate  325 mg Oral Q M,W,F  . Ipratropium-Albuterol  1 puff Inhalation QID  . isosorbide dinitrate  30 mg Oral BID  . lisinopril  10 mg Oral Daily  . metoprolol succinate  75 mg Oral Daily  . multivitamin with minerals  1 tablet Oral Daily  . pantoprazole  40 mg Oral BID  . polyethylene glycol  17 g Oral Daily  . simvastatin  40 mg Oral QHS  . sodium chloride flush  10-40 mL Intracatheter Q12H  . spironolactone  25 mg Oral Daily  . vitamin B-12  1,000 mcg Oral Daily   Continuous Infusions: . sodium chloride 100 mL/hr at 12/07/20 0845     LOS: 1 day   Time spent: 30 minutes.  Lorella Nimrod, MD [facility]  If 7PM-7AM, please contact night-coverage [email or web address]  12/07/2020, 1:15 PM   This record has been created using Systems analyst. Errors have been sought and corrected,but may not always be located. Such creation errors do not reflect on the standard of care.

## 2020-12-07 NOTE — Progress Notes (Signed)
PT Cancellation Note  Patient Details Name: Ashley [name] MRN: [id number] DOB: Mar 19, 1931   Cancelled Treatment:    Reason Eval/Treat Not Completed: Other (comment) Orders received, chart reviewed. Per son pt more confused today. Nurse confirms confusion is worse, reporting medical team has ordered another CT scan. Will hold until pt has imaging completed & f/u as able & as pt is medically appropriate.  Lavone Nian, PT, DPT 12/07/20, 9:31 AM    Waunita Schooner 12/07/2020, 9:30 AM

## 2020-12-07 NOTE — Evaluation (Signed)
Physical Therapy Evaluation Patient Details Name: Ashley [name] MRN: [id number] DOB: 08-Feb-1931 Today's Date: 12/07/2020   History of Present Illness  Pt is an 87 y/o F admitted on 12/05/20 with c/c of head trauma & LOC after falling. CT head concerning for subarachnoid hemorrhage, repeat CT with stable changes.  Neurosurgery recommending conservative management. Also found to have hyponatremia. PMH: HTN, HLD, CAD, COPD, depression, hx of stroke, anxiety, MI, PVD, GERD  Clinical Impression  Pt seen for PT evaluation after MD cleared pt for participation noting most recent imaging was stable. Pt's son Chief Technology Officer) present & providing PLOF, home set up information. Pt currently requires max<>total assist for bed mobility & max assist to stand EOB x 4 trials but with very poor balance. Pt unable to initiate stepping & unsafe to attempt transfer to recliner without +2 assist at this time. Pt would benefit from STR upon d/c to maximize independence with functional mobility, reduce fall risk, & decrease caregiver burden prior to return home. Will continue to follow pt acutely to progress transfers & gait as able.      Follow Up Recommendations SNF    Equipment Recommendations  None recommended by PT (TBD in next venue)    Recommendations for Other Services       Precautions / Restrictions Precautions Precautions: Fall Restrictions Weight Bearing Restrictions: No      Mobility  Bed Mobility Overal bed mobility: Needs Assistance Bed Mobility: Supine to Sit;Sit to Supine     Supine to sit: Max assist;HOB elevated Sit to supine: Total assist;+2 for physical assistance;HOB elevated   General bed mobility comments: Pt sleeps in lift chair at home. Pt requires max cuing & assist for bed mobility for supine>Sit. Limited ability in lifting BLE & lowering trunk for sit>supine requiring total assist +2 to complete.    Transfers Overall transfer level: Needs assistance Equipment used: Rolling  walker (2 wheeled) Transfers: Sit to/from Stand Sit to Stand: Max assist;From elevated surface         General transfer comment: Significant posterior lean back onto bed. Difficulty shifting pelvis anteriorly. Very shaky & this worsens with each standing attempt (pt able to complete 4).  Ambulation/Gait                Stairs            Wheelchair Mobility    Modified Rankin (Stroke Patients Only)       Balance Overall balance assessment: Needs assistance Sitting-balance support: Bilateral upper extremity supported;Feet unsupported Sitting balance-Leahy Scale: Poor Sitting balance - Comments: min assist sitting EOB   Standing balance support: Bilateral upper extremity supported Standing balance-Leahy Scale: Zero Standing balance comment: max assist & BUE support on RW when attempting to stand EOB                             Pertinent Vitals/Pain Pain Assessment: No/denies pain    Home Living Family/patient expects to be discharged to:: Skilled nursing facility Living Arrangements: Alone                    Prior Function Level of Independence: Independent with assistive device(s)         Comments: Son reports pt lives in a 1 level home with ramped entry. Uses lift chair & sleeps in it as well. Is mod I for household distances with RW, family pushes pt in w/c for community mobility. Family provides extensive care  but not 24/7 (home alone at night & noon-dinnertime 3 days/week). Family assists with bathing, dressing, meal prep & medication management.     Hand Dominance        Extremity/Trunk Assessment   Upper Extremity Assessment Upper Extremity Assessment: Generalized weakness    Lower Extremity Assessment Lower Extremity Assessment: Generalized weakness       Communication   Communication: No difficulties  Cognition Arousal/Alertness: Awake/alert Behavior During Therapy: WFL for tasks assessed/performed Overall  Cognitive Status: Difficult to assess Area of Impairment: Orientation;Memory;Safety/judgement;Following commands                 Orientation Level: Disoriented to;Place;Time;Situation   Memory: Decreased short-term memory Following Commands: Follows one step commands inconsistently;Follows one step commands with increased time Safety/Judgement: Decreased awareness of deficits;Decreased awareness of safety            General Comments General comments (skin integrity, edema, etc.): HR up to 116 bpm with standing EOB but after PT & son reposition pt in bed telemetry reading 192-237 bpm & nurse called to room. Nurse adjusted telemetry & dinamap reading 87 bpm, SPO2 97% on room air. Pt did note slight SOB with standing tasks with rest breaks provided PRN (pt's son reports hx of COPD).    Exercises     Assessment/Plan    PT Assessment Patient needs continued PT services  PT Problem List Decreased strength;Decreased balance;Decreased cognition;Cardiopulmonary status limiting activity;Decreased knowledge of use of DME;Decreased mobility;Decreased activity tolerance;Decreased coordination;Decreased safety awareness       PT Treatment Interventions DME instruction;Functional mobility training;Balance training;Patient/family education;Modalities;Gait training;Therapeutic activities;Neuromuscular re-education;Manual techniques;Cognitive remediation;Therapeutic exercise;Stair training    PT Goals (Current goals can be found in the Care Plan section)  Acute Rehab PT Goals Patient Stated Goal: to go to rehab PT Goal Formulation: With family Time For Goal Achievement: 12/21/20 Potential to Achieve Goals: Good    Frequency Min 2X/week   Barriers to discharge Decreased caregiver support family unable to provide 24/7 care    Co-evaluation               AM-PAC PT "6 Clicks" Mobility  Outcome Measure Help needed turning from your back to your side while in a flat bed without  using bedrails?: Total Help needed moving from lying on your back to sitting on the side of a flat bed without using bedrails?: Total Help needed moving to and from a bed to a chair (including a wheelchair)?: Total Help needed standing up from a chair using your arms (e.g., wheelchair or bedside chair)?: Total Help needed to walk in hospital room?: Total Help needed climbing 3-5 steps with a railing? : Total 6 Click Score: 6    End of Session Equipment Utilized During Treatment: Gait belt Activity Tolerance: Patient tolerated treatment well Patient left: in bed;with bed alarm set;with call bell/phone within reach;with nursing/sitter in room;with family/visitor present (in chair position) Nurse Communication: Mobility status (HR) PT Visit Diagnosis: Unsteadiness on feet (R26.81);Muscle weakness (generalized) (M62.81);Difficulty in walking, not elsewhere classified (R26.2)    Time: 0981-1914 PT Time Calculation (min) (ACUTE ONLY): 27 min   Charges:   PT Evaluation $PT Eval Moderate Complexity: 1 Mod PT Treatments $Therapeutic Activity: 8-22 mins        Lavone Nian, PT, DPT 12/07/20, 3:21 PM   Waunita Schooner 12/07/2020, 3:19 PM

## 2020-12-07 NOTE — Evaluation (Signed)
Occupational Therapy Evaluation Patient Details Name: Ashley [name] MRN: [id number] DOB: Apr 16, 1931 Today's Date: 12/07/2020    History of Present Illness Pt is an 87 y/o F admitted on 12/05/20 with c/c of head trauma & LOC after falling. CT head concerning for subarachnoid hemorrhage, repeat CT with stable changes.  Neurosurgery recommending conservative management. Also found to have hyponatremia. PMH: HTN, HLD, CAD, COPD, depression, hx of stroke, anxiety, MI, PVD, GERD   Clinical Impression   Pt seen for OT evaluation this date in setting of acute hospitalization following fall at home.  Pt's son present throughout session and assists in providing PLOF/home setup information. Pt lives alone but has children and aides rotate assistance with IADLs and some basic self care such as bathing/dressing. Pt was previously MOD I with fxl mobility with RW in the home and able to perform some basic self care such as grooming and toileting. Pt presents this date requiring increased assistance than her reported baseline 2/2 decreased balance, tolerance and strength. OT facilitates pt participation in sup to sit transition, sit to stand x2 trials with MAX A (both with and without RW) and an unsuccessful attempt to pivot to [facility]. In addition, OT engages pt in seated UB ADLs wtih SETUP to MIN A and seated LB ADLs with MAX A. Pt requires MAX A For sup to sit and TOTAL A to return to bed. Pt tolerates sitting EOB ~20 minutes with F sitting balance on assessment. Somewhat perseverative on needing to have BM, but will not use bed pan and unsuccessful in getting onto commode. RN notified. Will continue to follow pt acutely and anticipate she will require STR upon d/c from acute setting.     Follow Up Recommendations  SNF;Supervision/Assistance - 24 hour    Equipment Recommendations  Other (comment) (defer to next venue of care)    Recommendations for Other Services       Precautions / Restrictions  Precautions Precautions: Fall Restrictions Weight Bearing Restrictions: No      Mobility Bed Mobility Overal bed mobility: Needs Assistance Bed Mobility: Supine to Sit;Sit to Supine     Supine to sit: Mod assist;HOB elevated Sit to supine: Total assist   General bed mobility comments: Pt sleeps in lift chair at baseline. She requires extensive assit to get LEs back to bed after sitting ~20 mins    Transfers Overall transfer level: Needs assistance Equipment used: Rolling walker (2 wheeled);1 person hand held assist Transfers: Sit to/from Stand Sit to Stand: Max assist;From elevated surface         General transfer comment: OT engages pt in one sit to stand with RW with foot/knee blocking to R side and pt successfully able to get hips into extension for full stand, but requires maximum effort and she does not tolerate this long. She does want to attempt to get on [facility], so OT attempts to facilitate with patient. Arm in arm transfer technique utilized this time and pt requires MAX A again, but is noted to more successfully come to fully extended stand. However, pt unable to motor plan steps to weight shift to pivot onto BSC. Anticipate pt could do this with 2p assist or drop-arm commode with less height to clear.    Balance Overall balance assessment: Needs assistance Sitting-balance support: Bilateral upper extremity supported;Feet unsupported Sitting balance-Leahy Scale: Fair Sitting balance - Comments: Demos improved EOB sitting since PT assessment earlier this date. Pt demos F static sitting balance using UE support, but no external support.  Standing balance support: Bilateral upper extremity supported Standing balance-Leahy Scale: Zero Standing balance comment: with MAX A, pt able to come to static stand, requires B UE support and external suppot and only tolerates ~10 seconds. Noted to be grunting/moaning but denies pain without further probing/questioning.                            ADL either performed or assessed with clinical judgement   ADL                                         General ADL Comments: requires SETUP to MIN A and cues to sequence for seated UB ADLs, requires MOD/MAX A with seated LB ADLs such as donning socks. Pt requires MAX A with ADL transfer. Safety cues throughout. Responds best to tactile cues both 2/2 cognition and hearing.     Vision Patient Visual Report: No change from baseline       Perception     Praxis      Pertinent Vitals/Pain Pain Assessment: Faces Faces Pain Scale: Hurts even more Pain Location: hips/knees with attempts to mobilize. Pt continually denies pain and just reports being "stiff" with attempts to move, however-her son reports that she will do this regularly so as to not alarm her children, but with prying, she will endorse pain. With OT asking more questions, pt does finally endorse that her hips and knees do hurt her. Pain Descriptors / Indicators: Grimacing;Guarding;Moaning Pain Intervention(s): Limited activity within patient's tolerance;Monitored during session     Hand Dominance Right   Extremity/Trunk Assessment Upper Extremity Assessment Upper Extremity Assessment: Generalized weakness   Lower Extremity Assessment Lower Extremity Assessment: Generalized weakness       Communication Communication Communication: No difficulties   Cognition Arousal/Alertness: Awake/alert Behavior During Therapy: WFL for tasks assessed/performed Overall Cognitive Status: History of cognitive impairments - at baseline Area of Impairment: Orientation;Memory;Safety/judgement;Following commands;Problem solving                 Orientation Level: Disoriented to;Place;Time;Situation   Memory: Decreased short-term memory Following Commands: Follows one step commands inconsistently;Follows one step commands with increased time Safety/Judgement: Decreased awareness of  deficits;Decreased awareness of safety   Problem Solving: Slow processing;Difficulty sequencing;Requires verbal cues;Requires tactile cues General Comments: Pt is pleasant throughout and her son reports that her cognition does get worse in afternoon/evening-"sundowns" but she is overall able to follow cues. Somewhat persevartive on needing to have BM.   General Comments       Exercises Other Exercises Other Exercises: OT facilitates pt participation in sup to sit transition, sit to stand x2 trials with MAX A (both with and without RW) and an unsuccessful attempt to pivot to [facility]. In addition, OT engages pt in seated UB ADLs wtih SETUP to MIN A and seated LB ADLs with MAX A. Pt requires MAX A For sup to sit and TOTAL A to return to bed. Pt tolerates sitting EOB ~20 minutes with F sitting balance on assessment. Somewhat perseverative on needing to have BM, but will not use bed pan and unsuccessful in getting onto commode. RN notified.   Shoulder Instructions      Home Living Family/patient expects to be discharged to:: Skilled nursing facility Living Arrangements: Alone  Additional Comments: lives in [facility] with ramped entrance and her children alone with PCAs rotate care for pt including IADL assist. However, pt is usually able to reasonably care for her basic ADLs until recently which has prompted pt and family to consider STR at discharge      Prior Functioning/Environment Level of Independence: Needs assistance  Gait / Transfers Assistance Needed: Uses lift chair and sleeps in it. MOD I for [place] distance with RW. Family pushes pt in w/c for community mobility such as MD appts. Son endorses that recently, pt has been requiring more assistance with ADL transfers/fxl mobility than previously and more assist than family is able to provide. ADL's / Homemaking Assistance Needed: Family provides extensive care, but not 24/7 (home alone at night and noon-dinner  time 3 days a week). Family assists some with bathing and dressing at baseline, but endorse that pt has been requiring more and more assistance of late. Pt has aide assist for cooking/cleaning. Family assists with laundry. Pt's son present and report that she has been needing increasingly more assist for some of the basics that she could previously manage like toileting.            OT Problem List: Decreased strength;Impaired balance (sitting and/or standing);Decreased activity tolerance;Decreased cognition;Decreased safety awareness;Decreased knowledge of use of DME or AE;Pain      OT Treatment/Interventions: Self-care/ADL training;DME and/or AE instruction;Therapeutic activities;Balance training;Therapeutic exercise;Energy conservation;Patient/family education;Cognitive remediation/compensation    OT Goals(Current goals can be found in the care plan section) Acute Rehab OT Goals Patient Stated Goal: to go to rehab OT Goal Formulation: With patient/family Time For Goal Achievement: 12/21/20 Potential to Achieve Goals: Good ADL Goals Pt Will Transfer to Toilet: with mod assist;with max assist;stand pivot transfer;bedside commode Pt Will Perform Toileting - Clothing Manipulation and hygiene: with mod assist;with max assist;sitting/lateral leans Pt/caregiver will Perform Home Exercise Program: Increased strength;Both right and left upper extremity;With minimal assist  OT Frequency: Min 1X/week   Barriers to D/C:            Co-evaluation              AM-PAC OT "6 Clicks" Daily Activity     Outcome Measure Help from another person eating meals?: None Help from another person taking care of personal grooming?: A Little Help from another person toileting, which includes using toliet, bedpan, or urinal?: Total Help from another person bathing (including washing, rinsing, drying)?: A Lot Help from another person to put on and taking off regular upper body clothing?: A Little Help  from another person to put on and taking off regular lower body clothing?: Total 6 Click Score: 14   End of Session Equipment Utilized During Treatment: Gait belt;Rolling walker Nurse Communication: Mobility status;Other (comment)  Activity Tolerance: Patient tolerated treatment well Patient left: in bed;with call bell/phone within reach;with bed alarm set;with family/visitor present  OT Visit Diagnosis: Unsteadiness on feet (R26.81);Muscle weakness (generalized) (M62.81)                Time: 7026-3785 OT Time Calculation (min): 44 min Charges:  OT General Charges $OT Visit: 1 Visit OT Evaluation $OT Eval Moderate Complexity: 1 Mod OT Treatments $Self Care/Home Management : 23-37 mins $Therapeutic Activity: 8-22 mins  Gerrianne Scale, MS, OTR/L ascom [phone] 12/07/20, 7:41 PM

## 2020-12-08 DIAGNOSIS — E871 Hypo-osmolality and hyponatremia: Secondary | ICD-10-CM | POA: Diagnosis not present

## 2020-12-08 DIAGNOSIS — W19XXXA Unspecified fall, initial encounter: Secondary | ICD-10-CM | POA: Diagnosis not present

## 2020-12-08 DIAGNOSIS — I609 Nontraumatic subarachnoid hemorrhage, unspecified: Secondary | ICD-10-CM | POA: Diagnosis not present

## 2020-12-08 LAB — BASIC METABOLIC PANEL
Anion gap: 11 (ref 5–15)
Anion gap: 7 (ref 5–15)
BUN: 15 mg/dL (ref 8–23)
BUN: 22 mg/dL (ref 8–23)
CO2: 20 mmol/L — ABNORMAL LOW (ref 22–32)
CO2: 23 mmol/L (ref 22–32)
Calcium: 9.3 mg/dL (ref 8.9–10.3)
Calcium: 8.8 mg/dL — ABNORMAL LOW (ref 8.9–10.3)
Chloride: 92 mmol/L — ABNORMAL LOW (ref 98–111)
Chloride: 90 mmol/L — ABNORMAL LOW (ref 98–111)
Creatinine, Ser: 0.5 mg/dL (ref 0.44–1.00)
Creatinine, Ser: 0.45 mg/dL (ref 0.44–1.00)
GFR, Estimated: >60 mL/min (ref >60)
GFR, Estimated: >60 mL/min (ref >60)
Glucose, Bld: 123 mg/dL — ABNORMAL HIGH (ref 70–99)
Glucose, Bld: 108 mg/dL — ABNORMAL HIGH (ref 70–99)
Potassium: 4.1 mmol/L (ref 3.5–5.1)
Potassium: 4.7 mmol/L (ref 3.5–5.1)
Sodium: 123 mmol/L — ABNORMAL LOW (ref 135–145)
Sodium: 120 mmol/L — ABNORMAL LOW (ref 135–145)

## 2020-12-08 LAB — URIC ACID: Uric Acid, Serum: 2.7 mg/dL (ref 2.5–7.1)

## 2020-12-08 MED ORDER — QUETIAPINE FUMARATE 25 MG PO TABS
25.0000 mg | ORAL_TABLET | Freq: Every day | ORAL | Status: DC
  Administered 2020-12-08 – 2020-12-12 (×5): 25 mg via ORAL
  Filled 2020-12-08 (×5): qty 1

## 2020-12-08 NOTE — Progress Notes (Signed)
PROGRESS NOTE    Ashley [name]  DXI:[id number] DOB: 1931-02-03 DOA: 12/05/2020 PCP: Albina Billet, MD   Brief Narrative: Taken from H&P. Ashley [name] is a 87 y.o. female with medical history significant for hypertension, hyperlipidemia, CAD, COPD, depression, history of stroke presents to the ED for chief concern of falling with head trauma and loss of consciousness.  She reports that a doorbell ring as she was getting up from her chair to answer the door and the next thing she remembers she woke up and she was laying on her back on the floor.  She denies presyncopal feelings of chest pain, shortness of breath, dizziness.  She endorses that in the last couple of months she has been feeling very weak.  She notes that Zoloft was started a few months ago and since then she has felt very lethargic and weak.  CT head concerning for subarachnoid hemorrhage, repeat CT with stable changes.  Neurosurgery recommending conservative management. Also found to have hyponatremia.  Repeated another CT today when she was becoming more agitated and it shows resolution of prior findings and no new abnormality.  Subjective: Patient was sleeping peacefully when seen today.  Son at bedside who was requesting to let her sleep.  According to son she was very agitated earlier in the morning and overnight.  There was also some concern of hallucinations. Discussed with son about PT recommending SNF placement and he agrees, stating that he saw her working with PT and does not think that they can help her at home.  Assessment & Plan:   Active Problems:   Subarachnoid hematoma (HCC)   SAH (subarachnoid hemorrhage) (HCC)   Fall   Acute hyponatremia  Subarachnoid hemorrhage. Neurosurgery was consulted and they are suspecting secondary to trauma and not due to aneurysmal rupture. Repeat CT head was negative for any progression. No focal deficit. Per neurosurgery there is no need for any further  follow-up. Repeat CT head today when she become more agitated was negative for any new abnormality, shows rather some resolution of prior bleeding. -Keep holding aspirin for another 4 days, can resume on 12/12/2020.  Hyponatremia. Patient was recently started on Zoloft and experiencing generalized weakness since then. Also endorsed low-sodium diet. No change in sodium with IV normal saline. -Hyponatremia labs with low serum osmolality at 268, normal urine osmolality and urinary sodium of 27.  Most likely secondary to Zoloft. There was not much change in her sodium level after discontinuing Zoloft. Not much response to IV fluid or Lasix. Will ask nephrology to help. Discontinue spironolactone-nephrology discussed with her cardiologist. -Checking uric acid. -Monitor sodium  Generalized weakness. Might be secondary to hyponatremia. -PT/OT evaluation-recommending SNF placement, family is in agreeable. -TOC consult for placement  Chronic HFpEF. Patient appears euvolemic. Echocardiogram done in September 2021 with grade 1 diastolic dysfunction. -Continue home dose of lisinopril, metoprolol. -Parona lactone was discontinued today due to persistent hyponatremia, can add loop diuretics if needed.  Essential hypertension. Blood pressure within goal -Continue home dose of amlodipine, isosorbide dinitrate, lisinopril, metoprolol.  History of anxiety/depression. No acute concern Discontinue home dose of Zoloft due to hyponatremia. Continue home dose of alprazolam as needed.  Sundowning.  Patient is exhibiting signs of sundowning which is very common in this age group. -We will add low-dose Seroquel at night  Objective: Vitals:   12/07/20 1610 12/07/20 1941 12/08/20 0409 12/08/20 0723  BP: (!) 152/78 (!) 122/57 (!) 150/75 (!) 154/96  Pulse: 75 68 72 88  Resp:  17 18 16 17   Temp: 97.7 F (36.5 C) 98 F (36.7 C) 97.6 F (36.4 C) 97.9 F (36.6 C)  TempSrc: Oral Oral  Oral  SpO2: 97% 96% 96%  96%  Weight:   75.4 kg   Height:        Intake/Output Summary (Last 24 hours) at 12/08/2020 1316 Last data filed at 12/08/2020 0915 Gross per 24 hour  Intake 1528.52 ml  Output 2200 ml  Net -671.48 ml   Filed Weights   12/05/20 0952 12/07/20 0603 12/08/20 0409  Weight: 77.6 kg 76.7 kg 75.4 kg    Examination:  General.  Elderly lady, in no acute distress. Pulmonary.  Lungs clear bilaterally, normal respiratory effort. CV.  Regular rate and rhythm, no JVD, rub or murmur. Abdomen.  Soft, nontender, nondistended, BS positive. CNS.  Sleeping comfortably. Extremities.  No edema, no cyanosis, pulses intact and symmetrical. Psychiatry.  Judgment and insight appears normal.  DVT prophylaxis: SCDs Code Status: DNR Family Communication: Son was updated at bedside Disposition Plan:  Status is: Inpatient  Remains inpatient appropriate because:Inpatient level of care appropriate due to severity of illness   Dispo: The patient is from: Home              Anticipated d/c is to: Home              Anticipated d/c date is: 1 day              Patient currently is medically stable.   Difficult to place patient No              Level of care: Med-Surg  Consultants:   Neurosurgery  Procedures:  Antimicrobials:   Data Reviewed: I have personally reviewed following labs and imaging studies  CBC: Recent Labs  Lab 12/05/20 1205 12/06/20 0338 12/07/20 0511  WBC 13.1* 8.3 8.0  NEUTROABS 11.4*  --   --   HGB 15.0 15.0 15.7*  HCT 45.6 45.0 45.9  MCV 86.7 86.4 85.5  PLT 231 226 478   Basic Metabolic Panel: Recent Labs  Lab 12/06/20 1421 12/06/20 1701 12/07/20 0511 12/07/20 1643 12/08/20 0714  NA 122* 124* 124* 124* 123*  K 4.5 4.6 4.5 5.0 4.1  CL 92* 91* 90* 92* 92*  CO2 21* 24 23 23  20*  GLUCOSE 147* 108* 122* 107* 123*  BUN 36* 38* 24* 24* 15  CREATININE 0.84 0.71 0.50 0.64 0.50  CALCIUM 9.1 9.3 9.4 9.2 9.3   GFR: Estimated Creatinine Clearance: 49.3 mL/min (by C-G  formula based on SCr of 0.5 mg/dL). Liver Function Tests: Recent Labs  Lab 12/05/20 1205  AST 22  ALT 15  ALKPHOS 59  BILITOT 1.1  PROT 7.1  ALBUMIN 3.9   No results for input(s): LIPASE, AMYLASE in the last 168 hours. No results for input(s): AMMONIA in the last 168 hours. Coagulation Profile: Recent Labs  Lab 12/05/20 1205  INR 1.0   Cardiac Enzymes: No results for input(s): CKTOTAL, CKMB, CKMBINDEX, TROPONINI in the last 168 hours. BNP (last 3 results) No results for input(s): PROBNP in the last 8760 hours. HbA1C: No results for input(s): HGBA1C in the last 72 hours. CBG: No results for input(s): GLUCAP in the last 168 hours. Lipid Profile: No results for input(s): CHOL, HDL, LDLCALC, TRIG, CHOLHDL, LDLDIRECT in the last 72 hours. Thyroid Function Tests: Recent Labs    12/05/20 2148  TSH 1.674   Anemia Panel: No results for input(s): VITAMINB12, FOLATE, FERRITIN, TIBC, IRON,  RETICCTPCT in the last 72 hours. Sepsis Labs: No results for input(s): PROCALCITON, LATICACIDVEN in the last 168 hours.  Recent Results (from the past 240 hour(s))  Resp Panel by RT-PCR (Flu A&B, Covid) Nasopharyngeal Swab     Status: None   Collection Time: 12/05/20  6:59 PM   Specimen: Nasopharyngeal Swab; Nasopharyngeal(NP) swabs in vial transport medium  Result Value Ref Range Status   SARS Coronavirus 2 by RT PCR NEGATIVE NEGATIVE Final    Comment: (NOTE) SARS-CoV-2 target nucleic acids are NOT DETECTED.  The SARS-CoV-2 RNA is generally detectable in upper respiratory specimens during the acute phase of infection. The lowest concentration of SARS-CoV-2 viral copies this assay can detect is 138 copies/mL. A negative result does not preclude SARS-Cov-2 infection and should not be used as the sole basis for treatment or other patient management decisions. A negative result may occur with  improper specimen collection/handling, submission of specimen other than nasopharyngeal swab,  presence of viral mutation(s) within the areas targeted by this assay, and inadequate number of viral copies(<138 copies/mL). A negative result must be combined with clinical observations, patient history, and epidemiological information. The expected result is Negative.  Fact Sheet for Patients:  EntrepreneurPulse.com.au  Fact Sheet for Healthcare Providers:  IncredibleEmployment.be  This test is no t yet approved or cleared by the [place] FDA and  has been authorized for detection and/or diagnosis of SARS-CoV-2 by FDA under an Emergency Use Authorization (EUA). This EUA will remain  in effect (meaning this test can be used) for the duration of the COVID-19 declaration under Section 564(b)(1) of the Act, 21 U.S.C.section 360bbb-3(b)(1), unless the authorization is terminated  or revoked sooner.       Influenza A by PCR NEGATIVE NEGATIVE Final   Influenza B by PCR NEGATIVE NEGATIVE Final    Comment: (NOTE) The Xpert Xpress SARS-CoV-2/FLU/RSV plus assay is intended as an aid in the diagnosis of influenza from Nasopharyngeal swab specimens and should not be used as a sole basis for treatment. Nasal washings and aspirates are unacceptable for Xpert Xpress SARS-CoV-2/FLU/RSV testing.  Fact Sheet for Patients: EntrepreneurPulse.com.au  Fact Sheet for Healthcare Providers: IncredibleEmployment.be  This test is not yet approved or cleared by the [place] FDA and has been authorized for detection and/or diagnosis of SARS-CoV-2 by FDA under an Emergency Use Authorization (EUA). This EUA will remain in effect (meaning this test can be used) for the duration of the COVID-19 declaration under Section 564(b)(1) of the Act, 21 U.S.C. section 360bbb-3(b)(1), unless the authorization is terminated or revoked.  Performed at [facility], [address], [place], [place] [address]      Radiology  Studies: CT HEAD WO CONTRAST  Result Date: 12/07/2020 CLINICAL DATA:  87 year old female status post fall with small volume subarachnoid hemorrhage on 12/05/2020. Subsequent encounter. EXAM: CT HEAD WITHOUT CONTRAST TECHNIQUE: Contiguous axial images were obtained from the base of the skull through the vertex without intravenous contrast. COMPARISON:  Head CT 12/05/2020.  Brain MRI 07/13/2020. FINDINGS: Brain: Right PCA territory encephalomalacia from the September infarct. Small volume subarachnoid hemorrhage over the anterior superior frontal convexities and at the vertex has regressed, with trace residual (series 3, image 20). No intraventricular hemorrhage or ventriculomegaly. No new intracranial blood products. Stable gray-white matter differentiation throughout the brain. No cortically based acute infarct identified. Vascular: Calcified atherosclerosis at the skull base. No suspicious intracranial vascular hyperdensity. Skull: Stable, intact. Sinuses/Orbits: Trace fluid in the left sphenoid appears stable and inflammatory. Other Visualized  paranasal sinuses and mastoids are stable and well pneumatized. Other: No acute orbit or scalp soft tissue injury. IMPRESSION: 1. Regressed small volume posttraumatic subarachnoid hemorrhage. Trace residual. 2. No new intracranial abnormality. Right PCA territory encephalomalacia from the September infarct. Electronically Signed   By: Genevie Ann M.D.   On: 12/07/2020 10:25    Scheduled Meds: . amLODipine  10 mg Oral Daily  . feeding supplement  237 mL Oral BID BM  . ferrous sulfate  325 mg Oral Q M,W,F  . Ipratropium-Albuterol  1 puff Inhalation QID  . isosorbide dinitrate  30 mg Oral BID  . lisinopril  10 mg Oral Daily  . metoprolol succinate  75 mg Oral Daily  . multivitamin with minerals  1 tablet Oral Daily  . pantoprazole  40 mg Oral BID  . polyethylene glycol  17 g Oral Daily  . simvastatin  40 mg Oral QHS  . sodium chloride flush  10-40 mL Intracatheter  Q12H  . vitamin B-12  1,000 mcg Oral Daily   Continuous Infusions:    LOS: 2 days   Time spent: 25 minutes.  Lorella Nimrod, MD [facility]  If 7PM-7AM, please contact night-coverage [email or web address]  12/08/2020, 1:16 PM   This record has been created using Systems analyst. Errors have been sought and corrected,but may not always be located. Such creation errors do not reflect on the standard of care.

## 2020-12-08 NOTE — Consult Note (Signed)
[facility]  CONSULT NOTE    Date: 12/08/2020                  Patient Name:  Ashley [name]  MRN: [id number]  DOB: 07/12/1931  Age / Sex: 87 y.o., female         PCP: Jaclyn Shaggy, MD                 Service Requesting Consult: Dr. Nelson Chimes                 Reason for Consult: Hyponatremia            History of Present Illness: Ms. Ashley [name] admitted on 2/10 for head trauma leading to a subarachnoid hemorrhage. Recommended conservative management by neurosurgery. Patient was found to have a serum sodium of 126 and this has decreased to 123. Baseline of 137 from 07/24/2020.  Her sertraline was discontinued.   Patient unable to give much of a history this morning. Seems to be having visual hallucinations.    Medications: Outpatient medications: Medications Prior to Admission  Medication Sig Dispense Refill Last Dose  . acetaminophen (TYLENOL) 650 MG CR tablet Take 650 mg by mouth every 8 (eight) hours as needed for pain.   Unknown at PRN  . ALPRAZolam (XANAX) 0.25 MG tablet Take 0.25 mg by mouth 2 (two) times daily as needed for anxiety.   Unknown at PRN  . amLODipine (NORVASC) 10 MG tablet Take 10 mg by mouth daily.   12/04/2020 at Unknown  . aspirin EC 81 MG EC tablet Take 1 tablet (81 mg total) by mouth daily. Swallow whole. 30 tablet 11   . Ferrous Sulfate (IRON) 325 (65 Fe) MG TABS Take 325 mg by mouth every Monday, Wednesday, and Friday.     . Ipratropium-Albuterol (COMBIVENT) 20-100 MCG/ACT AERS respimat Inhale 1 puff into the lungs in the morning, at noon, in the evening, and at bedtime.   12/04/2020 at Unknown  . isosorbide dinitrate (ISORDIL) 30 MG tablet Take 30 mg by mouth 2 (two) times daily.   12/04/2020 at 2000  . lisinopril (ZESTRIL) 10 MG tablet Take 10 mg by mouth daily.   12/04/2020 at Unknown  . metoprolol succinate (TOPROL-XL) 50 MG 24 hr tablet Take 75 mg by mouth daily.   12/04/2020 at Unknown  . pantoprazole (PROTONIX) 40 MG tablet Take 40 mg  by mouth 2 (two) times daily.   12/04/2020 at Unknown  . sertraline (ZOLOFT) 25 MG tablet Take 25 mg by mouth daily.   12/04/2020 at Unknown  . simvastatin (ZOCOR) 40 MG tablet Take 40 mg by mouth daily.   12/04/2020 at Unknown  . spironolactone (ALDACTONE) 25 MG tablet Take 25 mg by mouth daily.   12/04/2020 at Unknown  . vitamin B-12 (CYANOCOBALAMIN) 1000 MCG tablet Take 1,000 mcg by mouth daily.       Current medications: Current Facility-Administered Medications  Medication Dose Route Frequency Provider Last Rate Last Admin  . acetaminophen (TYLENOL) tablet 325 mg  325 mg Oral Q6H PRN Cox, Amy N, DO   325 mg at 12/08/20 0522   Or  . acetaminophen (TYLENOL) suppository 325 mg  325 mg Rectal Q6H PRN Cox, Amy N, DO      . ALPRAZolam Prudy Feeler) tablet 0.25 mg  0.25 mg Oral BID PRN Cox, Amy N, DO   0.25 mg at 12/07/20 2018  . amLODipine (NORVASC) tablet 10 mg  10 mg Oral Daily Cox,  Amy N, DO   10 mg at 12/07/20 0852  . feeding supplement (ENSURE ENLIVE / ENSURE PLUS) liquid 237 mL  237 mL Oral BID BM Arnetha Courser, MD   237 mL at 12/07/20 1409  . ferrous sulfate tablet 325 mg  325 mg Oral Q M,W,F Cox, Amy N, DO   325 mg at 12/06/20 0858  . Ipratropium-Albuterol (COMBIVENT) respimat 1 puff  1 puff Inhalation QID Cox, Amy N, DO   1 puff at 12/08/20 0726  . isosorbide dinitrate (ISORDIL) tablet 30 mg  30 mg Oral BID Cox, Amy N, DO   30 mg at 12/07/20 2018  . lisinopril (ZESTRIL) tablet 10 mg  10 mg Oral Daily Cox, Amy N, DO   10 mg at 12/07/20 0853  . metoprolol succinate (TOPROL-XL) 24 hr tablet 75 mg  75 mg Oral Daily Cox, Amy N, DO   75 mg at 12/07/20 0853  . metoprolol tartrate (LOPRESSOR) injection 5 mg  5 mg Intravenous Q4H PRN Cox, Amy N, DO      . multivitamin with minerals tablet 1 tablet  1 tablet Oral Daily Arnetha Courser, MD   1 tablet at 12/07/20 0853  . ondansetron (ZOFRAN) tablet 4 mg  4 mg Oral Q6H PRN Cox, Amy N, DO   4 mg at 12/06/20 0859   Or  . ondansetron (ZOFRAN) injection 4 mg  4 mg  Intravenous Q6H PRN Cox, Amy N, DO   4 mg at 12/07/20 1037  . pantoprazole (PROTONIX) EC tablet 40 mg  40 mg Oral BID Cox, Amy N, DO   40 mg at 12/07/20 2019  . polyethylene glycol (MIRALAX / GLYCOLAX) packet 17 g  17 g Oral Daily Arnetha Courser, MD   17 g at 12/07/20 0852  . polyvinyl alcohol (LIQUIFILM TEARS) 1.4 % ophthalmic solution 1 drop  1 drop Both Eyes PRN Cox, Amy N, DO   1 drop at 12/05/20 2142  . simvastatin (ZOCOR) tablet 40 mg  40 mg Oral QHS Cox, Amy N, DO   40 mg at 12/07/20 2018  . sodium chloride flush (NS) 0.9 % injection 10-40 mL  10-40 mL Intracatheter Q12H Cox, Amy N, DO   10 mL at 12/07/20 2353  . sodium chloride flush (NS) 0.9 % injection 10-40 mL  10-40 mL Intracatheter PRN Cox, Amy N, DO      . spironolactone (ALDACTONE) tablet 25 mg  25 mg Oral Daily Cox, Amy N, DO   25 mg at 12/07/20 0852  . vitamin B-12 (CYANOCOBALAMIN) tablet 1,000 mcg  1,000 mcg Oral Daily Cox, Amy N, DO   1,000 mcg at 12/07/20 8657      Allergies: No Known Allergies    Past Medical History: Past Medical History:  Diagnosis Date  . Anemia   . Anxiety   . Arthritis   . COPD (chronic obstructive pulmonary disease) (HCC)   . Coronary artery disease   . Edema    feet/ankles   wears support hose  . GERD (gastroesophageal reflux disease)   . GI (gastrointestinal bleed)    history  . Hypertension   . Myocardial infarction (HCC)   . Peripheral vascular disease (HCC)   . Shortness of breath dyspnea      Past Surgical History: Past Surgical History:  Procedure Laterality Date  . ABDOMINAL HYSTERECTOMY    . ABLATION     right leg less than 1 year ago  . CATARACT EXTRACTION W/ INTRAOCULAR LENS  IMPLANT, BILATERAL Bilateral   .  CHOLECYSTECTOMY N/A 07/18/2018   Procedure: LAPAROSCOPIC CHOLECYSTECTOMY;  Surgeon: Leafy Ro, MD;  Location: [facility] ORS;  Service: General;  Laterality: N/A;  . CORONARY ANGIOPLASTY     stents  . ENDOSCOPIC RETROGRADE CHOLANGIOPANCREATOGRAPHY (ERCP) WITH  PROPOFOL N/A 07/15/2018   Procedure: ENDOSCOPIC RETROGRADE CHOLANGIOPANCREATOGRAPHY (ERCP) WITH PROPOFOL;  Surgeon: Midge Minium, MD;  Location: [facility] ENDOSCOPY;  Service: Endoscopy;  Laterality: N/A;  . EYE SURGERY    . FRACTURE SURGERY     right wrist  . TOTAL HIP ARTHROPLASTY Right 02/20/2016   Procedure: TOTAL HIP ARTHROPLASTY ANTERIOR APPROACH;  Surgeon: Kennedy Bucker, MD;  Location: [facility] ORS;  Service: Orthopedics;  Laterality: Right;     Family History: Family History  Problem Relation Age of Onset  . Heart attack Father   . Stroke Sister   . Testicular cancer Brother   . COPD Brother      Social History: Social History   Socioeconomic History  . Marital status: Widowed    Spouse name: Not on file  . Number of children: Not on file  . Years of education: Not on file  . Highest education level: Not on file  Occupational History  . Not on file  Tobacco Use  . Smoking status: Former Smoker    Quit date: 02/11/1997    Years since quitting: 23.8  . Smokeless tobacco: Never Used  Vaping Use  . Vaping Use: Never used  Substance and Sexual Activity  . Alcohol use: No  . Drug use: No  . Sexual activity: Not on file  Other Topics Concern  . Not on file  Social History Narrative  . Not on file   Social Determinants of Health   Financial Resource Strain: Not on file  Food Insecurity: Not on file  Transportation Needs: Not on file  Physical Activity: Not on file  Stress: Not on file  Social Connections: Not on file  Intimate Partner Violence: Not on file     Review of Systems: Review of Systems  Unable to perform ROS: Mental status change    Vital Signs: Blood pressure (!) 154/96, pulse 88, temperature 97.9 F (36.6 C), temperature source Oral, resp. rate 17, height 5\' 4"  (1.626 m), weight 75.4 kg, SpO2 96 %.  Weight trends: Filed Weights   12/05/20 0952 12/07/20 0603 12/08/20 0409  Weight: 77.6 kg 76.7 kg 75.4 kg    Physical Exam: General: NAD,    Head: Normocephalic, atraumatic. Moist oral mucosal membranes  Eyes: Anicteric, PERRL  Neck: Supple, trachea midline  Lungs:  Clear to auscultation  Heart: Regular rate and rhythm  Abdomen:  Soft, nontender,   Extremities:  no peripheral edema.  Neurologic: Alert to self only  Skin: No lesions     Lab results: Basic Metabolic Panel: Recent Labs  Lab 12/07/20 0511 12/07/20 1643 12/08/20 0714  NA 124* 124* 123*  K 4.5 5.0 4.1  CL 90* 92* 92*  CO2 23 23 20*  GLUCOSE 122* 107* 123*  BUN 24* 24* 15  CREATININE 0.50 0.64 0.50  CALCIUM 9.4 9.2 9.3    Liver Function Tests: Recent Labs  Lab 12/05/20 1205  AST 22  ALT 15  ALKPHOS 59  BILITOT 1.1  PROT 7.1  ALBUMIN 3.9   No results for input(s): LIPASE, AMYLASE in the last 168 hours. No results for input(s): AMMONIA in the last 168 hours.  CBC: Recent Labs  Lab 12/05/20 1205 12/06/20 0338 12/07/20 0511  WBC 13.1* 8.3 8.0  NEUTROABS 11.4*  --   --  HGB 15.0 15.0 15.7*  HCT 45.6 45.0 45.9  MCV 86.7 86.4 85.5  PLT 231 226 238    Cardiac Enzymes: No results for input(s): CKTOTAL, CKMB, CKMBINDEX, TROPONINI in the last 168 hours.  BNP: Invalid input(s): POCBNP  CBG: No results for input(s): GLUCAP in the last 168 hours.  Microbiology: Results for orders placed or performed during the hospital encounter of 12/05/20  Resp Panel by RT-PCR (Flu A&B, Covid) Nasopharyngeal Swab     Status: None   Collection Time: 12/05/20  6:59 PM   Specimen: Nasopharyngeal Swab; Nasopharyngeal(NP) swabs in vial transport medium  Result Value Ref Range Status   SARS Coronavirus 2 by RT PCR NEGATIVE NEGATIVE Final    Comment: (NOTE) SARS-CoV-2 target nucleic acids are NOT DETECTED.  The SARS-CoV-2 RNA is generally detectable in upper respiratory specimens during the acute phase of infection. The lowest concentration of SARS-CoV-2 viral copies this assay can detect is 138 copies/mL. A negative result does not preclude  SARS-Cov-2 infection and should not be used as the sole basis for treatment or other patient management decisions. A negative result may occur with  improper specimen collection/handling, submission of specimen other than nasopharyngeal swab, presence of viral mutation(s) within the areas targeted by this assay, and inadequate number of viral copies(<138 copies/mL). A negative result must be combined with clinical observations, patient history, and epidemiological information. The expected result is Negative.  Fact Sheet for Patients:  BloggerCourse.com  Fact Sheet for Healthcare Providers:  SeriousBroker.it  This test is no t yet approved or cleared by the [place] FDA and  has been authorized for detection and/or diagnosis of SARS-CoV-2 by FDA under an Emergency Use Authorization (EUA). This EUA will remain  in effect (meaning this test can be used) for the duration of the COVID-19 declaration under Section 564(b)(1) of the Act, 21 U.S.C.section 360bbb-3(b)(1), unless the authorization is terminated  or revoked sooner.       Influenza A by PCR NEGATIVE NEGATIVE Final   Influenza B by PCR NEGATIVE NEGATIVE Final    Comment: (NOTE) The Xpert Xpress SARS-CoV-2/FLU/RSV plus assay is intended as an aid in the diagnosis of influenza from Nasopharyngeal swab specimens and should not be used as a sole basis for treatment. Nasal washings and aspirates are unacceptable for Xpert Xpress SARS-CoV-2/FLU/RSV testing.  Fact Sheet for Patients: BloggerCourse.com  Fact Sheet for Healthcare Providers: SeriousBroker.it  This test is not yet approved or cleared by the [place] FDA and has been authorized for detection and/or diagnosis of SARS-CoV-2 by FDA under an Emergency Use Authorization (EUA). This EUA will remain in effect (meaning this test can be used) for the duration of  the COVID-19 declaration under Section 564(b)(1) of the Act, 21 U.S.C. section 360bbb-3(b)(1), unless the authorization is terminated or revoked.  Performed at [facility], [address] [place], [place] [address]     Coagulation Studies: Recent Labs    12/05/20 1205  LABPROT 12.8  INR 1.0    Urinalysis: Recent Labs    12/05/20 1344 12/06/20 1340  COLORURINE YELLOW* YELLOW*  LABSPEC 1.009 1.019  PHURINE 8.0 5.0  GLUCOSEU NEGATIVE NEGATIVE  HGBUR SMALL* SMALL*  BILIRUBINUR NEGATIVE NEGATIVE  KETONESUR NEGATIVE 5*  PROTEINUR NEGATIVE NEGATIVE  NITRITE NEGATIVE NEGATIVE  LEUKOCYTESUR NEGATIVE NEGATIVE      Imaging: CT HEAD WO CONTRAST  Result Date: 12/07/2020 CLINICAL DATA:  87 year old female status post fall with small volume subarachnoid hemorrhage on 12/05/2020. Subsequent encounter. EXAM: CT HEAD WITHOUT  CONTRAST TECHNIQUE: Contiguous axial images were obtained from the base of the skull through the vertex without intravenous contrast. COMPARISON:  Head CT 12/05/2020.  Brain MRI 07/13/2020. FINDINGS: Brain: Right PCA territory encephalomalacia from the September infarct. Small volume subarachnoid hemorrhage over the anterior superior frontal convexities and at the vertex has regressed, with trace residual (series 3, image 20). No intraventricular hemorrhage or ventriculomegaly. No new intracranial blood products. Stable gray-white matter differentiation throughout the brain. No cortically based acute infarct identified. Vascular: Calcified atherosclerosis at the skull base. No suspicious intracranial vascular hyperdensity. Skull: Stable, intact. Sinuses/Orbits: Trace fluid in the left sphenoid appears stable and inflammatory. Other Visualized paranasal sinuses and mastoids are stable and well pneumatized. Other: No acute orbit or scalp soft tissue injury. IMPRESSION: 1. Regressed small volume posttraumatic subarachnoid hemorrhage. Trace residual. 2. No new  intracranial abnormality. Right PCA territory encephalomalacia from the September infarct. Electronically Signed   By: Odessa Fleming M.D.   On: 12/07/2020 10:25      Assessment & Plan: Ms. AVNEET BARBATI is a 87 y.o. white female with peripheral vascular disease, hypertension, coronary artery disease, GERD, COPD, anxiety, anemia, who was admitted to [facility] on 12/05/2020 for Acute hyponatremia [E87.1] SAH (subarachnoid hemorrhage) (HCC) [I60.9] Subarachnoid hematoma (HCC) [S06.6X9A] Fall, initial encounter [W19.XXXA] Subarachnoid hemorrhage (HCC) [I60.9]  1. Hyponatremia: mental status changes could be attributed to hyponatremia.  - agree with discontinuing sertraline - Discontinue spironolactone. I have discussed case with patient's outpatient cardiologist who is agreement.  - Check uric acid  2. Hypertension: elevated, 154/96. Home regimen of spironolactone, amlodipine, lisinopril, metoprolol and isosorbide mononitrate - discontinue spironolactone as above.  - Continue all other agents.  - If further control required, will consider a loop diuretic.   3. Diastolic congestive heart faiure: chronic and not in acute exacerbation.    LOS: 2 Rankin Coolman 2/13/20229:10 AM

## 2020-12-08 NOTE — NC FL2 (Signed)
[place] MEDICAID [id number] LEVEL OF CARE SCREENING TOOL     IDENTIFICATION  Patient Name: Ashley [name] Birthdate: 06-Aug-1931 Sex: female Admission Date (Current Location): 12/05/2020  [place] and [place] Number:  Chiropodist and Address:  [facility], [address], [place], [place] [address]      Provider Number:    Attending Physician Name and Address:  Arnetha Courser, MD  Relative Name and Phone Number:  Alasha Mckeague [phone]    Current Level of Care: Hospital Recommended Level of Care: Skilled Nursing Facility Prior Approval Number:    Date Approved/Denied:   PASRR Number: pending  Discharge Plan: SNF    Current Diagnoses: Patient Active Problem List   Diagnosis Date Noted  . SAH (subarachnoid hemorrhage) (HCC) 12/06/2020  . Fall   . Acute hyponatremia   . Subarachnoid hematoma (HCC) 12/05/2020  . 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 Coronary artery disease 07/13/2020  . Benign hypertension 07/13/2020  . Acute CVA (cerebrovascular accident) (HCC) 07/13/2020  . Acute on chronic blood loss anemia 07/13/2020  . Severe Symptomatic anemia 07/13/2020  . Acute metabolic encephalopathy 07/13/2020  . COPD (chronic obstructive pulmonary disease) (HCC) 07/13/2020  . Cholecystitis   . Choledocholithiasis 07/15/2018  . Chest pain 07/13/2018  . Primary osteoarthritis of right hip 02/20/2016    Orientation RESPIRATION BLADDER Height & Weight     Self  Normal Incontinent Weight: 166 lb 3.2 oz (75.4 kg) Height:  5\' 4"  (162.6 cm)  BEHAVIORAL SYMPTOMS/MOOD NEUROLOGICAL BOWEL NUTRITION STATUS      Continent    AMBULATORY STATUS COMMUNICATION OF NEEDS Skin   Extensive Assist Verbally Normal                       Personal Care Assistance Level of Assistance  Bathing,Feeding,Dressing Bathing Assistance: Maximum assistance Feeding assistance: Maximum assistance Dressing Assistance: Maximum assistance     Functional  Limitations Info  Sight,Hearing,Speech Sight Info: Adequate Hearing Info: Adequate Speech Info: Adequate    SPECIAL CARE FACTORS FREQUENCY  PT (By licensed PT),OT (By licensed OT)     PT Frequency: 5x per week OT Frequency: 5x per week            Contractures Contractures Info: Not present    Additional Factors Info  Code Status Code Status Info: DNR             Current Medications (12/08/2020):  This is the current hospital active medication list Current Facility-Administered Medications  Medication Dose Route Frequency Provider Last Rate Last Admin  . acetaminophen (TYLENOL) tablet 325 mg  325 mg Oral Q6H PRN Cox, Amy N, DO   325 mg at 12/08/20 0522   Or  . acetaminophen (TYLENOL) suppository 325 mg  325 mg Rectal Q6H PRN Cox, Amy N, DO      . ALPRAZolam Prudy Feeler) tablet 0.25 mg  0.25 mg Oral BID PRN Cox, Amy N, DO   0.25 mg at 12/07/20 2018  . amLODipine (NORVASC) tablet 10 mg  10 mg Oral Daily Cox, Amy N, DO   10 mg at 12/08/20 0947  . feeding supplement (ENSURE ENLIVE / ENSURE PLUS) liquid 237 mL  237 mL Oral BID BM Arnetha Courser, MD   237 mL at 12/08/20 0948  . ferrous sulfate tablet 325 mg  325 mg Oral Q M,W,F Cox, Amy N, DO   325 mg at 12/06/20 0858  . Ipratropium-Albuterol (COMBIVENT) respimat 1 puff  1 puff Inhalation QID Cox,  Amy N, DO   1 puff at 12/08/20 0726  . isosorbide dinitrate (ISORDIL) tablet 30 mg  30 mg Oral BID Cox, Amy N, DO   30 mg at 12/08/20 0947  . lisinopril (ZESTRIL) tablet 10 mg  10 mg Oral Daily Cox, Amy N, DO   10 mg at 12/08/20 0947  . metoprolol succinate (TOPROL-XL) 24 hr tablet 75 mg  75 mg Oral Daily Cox, Amy N, DO   75 mg at 12/08/20 0947  . metoprolol tartrate (LOPRESSOR) injection 5 mg  5 mg Intravenous Q4H PRN Cox, Amy N, DO      . multivitamin with minerals tablet 1 tablet  1 tablet Oral Daily Arnetha Courser, MD   1 tablet at 12/08/20 0947  . ondansetron (ZOFRAN) tablet 4 mg  4 mg Oral Q6H PRN Cox, Amy N, DO   4 mg at 12/06/20 0859    Or  . ondansetron (ZOFRAN) injection 4 mg  4 mg Intravenous Q6H PRN Cox, Amy N, DO   4 mg at 12/07/20 1037  . pantoprazole (PROTONIX) EC tablet 40 mg  40 mg Oral BID Cox, Amy N, DO   40 mg at 12/08/20 0948  . polyethylene glycol (MIRALAX / GLYCOLAX) packet 17 g  17 g Oral Daily Arnetha Courser, MD   17 g at 12/08/20 0947  . polyvinyl alcohol (LIQUIFILM TEARS) 1.4 % ophthalmic solution 1 drop  1 drop Both Eyes PRN Cox, Amy N, DO   1 drop at 12/05/20 2142  . QUEtiapine (SEROQUEL) tablet 25 mg  25 mg Oral QHS Arnetha Courser, MD      . simvastatin (ZOCOR) tablet 40 mg  40 mg Oral QHS Cox, Amy N, DO   40 mg at 12/07/20 2018  . sodium chloride flush (NS) 0.9 % injection 10-40 mL  10-40 mL Intracatheter Q12H Cox, Amy N, DO   10 mL at 12/08/20 0948  . sodium chloride flush (NS) 0.9 % injection 10-40 mL  10-40 mL Intracatheter PRN Cox, Amy N, DO      . vitamin B-12 (CYANOCOBALAMIN) tablet 1,000 mcg  1,000 mcg Oral Daily Cox, Amy N, DO   1,000 mcg at 12/08/20 4098     Discharge Medications: Please see discharge summary for a list of discharge medications.  Relevant Imaging Results:  Relevant Lab Results:   Additional Information    April Carlyon, [place], [place]

## 2020-12-08 NOTE — TOC Initial Note (Signed)
Transition of Care [facility]) - Initial/Assessment Note    Patient Details  Name: Ashley [name] MRN: [id number] Date of Birth: [birth year]  Transition of Care [facility]) CM/SW Contact:    Verna Czech [place], [place] Phone Number:  12/08/2020, 1:20 PM  Clinical Narrative:                 Patient is a 87 year old female admitted with c/c of head trauma and LOC after falling. Patient was asleep during visit, this social worker spoke to patient's son Gene at bedside. Patient resides alone in her own home, however accordoing to son Gene [phone] has a strong support system. Gene lives in [place] and provides care on the [facility] Friday-Monday.  Patient's daughter Elnita Maxwell [phone] lives in [place] and assist with care on the weekends as well.  Patient's oldest son Genevie Cheshire [phone] lives locally and has HCPOA. Patient also has a private duty aid that provides care daily. Patient has a hospital bed at home, however prefers to sleep in her lift chair. Patient uses a walker to ambulate. She also has a raised toilet seat and shower chair. Family is in agreement that patient would benefit from a SNF. SNF process explained. Patient's son would like the search to remain in the [facility] area. Patient's PCP is Dewaine Oats and she uses the [facility] for her pharmacy needs. Fl2 faxed out. PASRR went to a level II, documents need to be uploaded.  Transition of Care to continue to follow   [facility], LCSW Transition of Care [phone]   Expected Discharge Plan: Skilled Nursing Facility Barriers to Discharge: No Barriers Identified   Patient Goals and CMS Choice Patient states their goals for this hospitalization and ongoing recovery are:: paitent asleep during visit, met with patient's son at bedside CMS Medicare.gov Compare Post Acute Care list provided to:: Patient Represenative (must comment) (patient's son Gene)    Expected Discharge Plan and Services Expected  Discharge Plan: Skilled Nursing Facility In-house Referral: Clinical Social Work   Post Acute Care Choice: Skilled Nursing Facility Living arrangements for the past 2 months: Single Family Home                                      Prior Living Arrangements/Services Living arrangements for the past 2 months: Single Family Home Lives with:: Self Patient language and need for interpreter reviewed:: Yes Do you feel safe going back to the place where you live?: Yes      Need for Family Participation in Patient Care: Yes (Comment) Care giver support system in place?: Yes (comment) Current home services: Homehealth aide,DME (walker, llife alert, aid comes in daily, family rotates care as well) Criminal Activity/Legal Involvement Pertinent to Current Situation/Hospitalization: No - Comment as needed  Activities of Daily Living Home Assistive Devices/Equipment: Walker (specify type) ADL Screening (condition at time of admission) Patient's cognitive ability adequate to safely complete daily activities?: No Is the patient deaf or have difficulty hearing?: No Does the patient have difficulty seeing, even when wearing glasses/contacts?: No Does the patient have difficulty concentrating, remembering, or making decisions?: Yes Patient able to express need for assistance with ADLs?: Yes Does the patient have difficulty dressing or bathing?: Yes Independently performs ADLs?: Yes (appropriate for developmental age) Communication: Independent Dressing (OT): Independent Grooming: Needs assistance Is this a change from baseline?: Pre-admission baseline Feeding: Needs assistance Is this a change  from baseline?: Pre-admission baseline Bathing: Needs assistance Is this a change from baseline?: Pre-admission baseline Toileting: Independent with device (comment) In/Out Bed: Independent with device (comment) Walks in Home: Independent with device (comment) Does the patient have difficulty  walking or climbing stairs?: Yes Weakness of Legs: Both Weakness of Arms/Hands: Both  Permission Sought/Granted                  Emotional Assessment Appearance:: Appears stated age Attitude/Demeanor/Rapport: Lethargic   Orientation: :  (could not fully assess, patient napping) Alcohol / Substance Use: Not Applicable Psych Involvement: No (comment)  Admission diagnosis:  Acute hyponatremia [E87.1] SAH (subarachnoid hemorrhage) (HCC) [I60.9] Subarachnoid hematoma (HCC) [S06.6X9A] Fall, initial encounter [W19.XXXA] Subarachnoid hemorrhage (HCC) [I60.9] Patient Active Problem List   Diagnosis Date Noted  . SAH (subarachnoid hemorrhage) (HCC) 12/06/2020  . Fall   . Acute hyponatremia   . Subarachnoid hematoma (HCC) 12/05/2020  . 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 Coronary artery disease 07/13/2020  . Benign hypertension 07/13/2020  . Acute CVA (cerebrovascular accident) (HCC) 07/13/2020  . Acute on chronic blood loss anemia 07/13/2020  . Severe Symptomatic anemia 07/13/2020  . Acute metabolic encephalopathy 07/13/2020  . COPD (chronic obstructive pulmonary disease) (HCC) 07/13/2020  . Cholecystitis   . Choledocholithiasis 07/15/2018  . Chest pain 07/13/2018  . Primary osteoarthritis of right hip 02/20/2016   PCP:  Jaclyn Shaggy, MD Pharmacy:   [facility] [place], [place] - [address] [place] [address] Phone: [phone] Fax: [phone]     Social Determinants of Health (SDOH) Interventions    Readmission Risk Interventions No flowsheet data found.

## 2020-12-09 DIAGNOSIS — W19XXXA Unspecified fall, initial encounter: Secondary | ICD-10-CM | POA: Diagnosis not present

## 2020-12-09 DIAGNOSIS — E871 Hypo-osmolality and hyponatremia: Secondary | ICD-10-CM | POA: Diagnosis not present

## 2020-12-09 DIAGNOSIS — I609 Nontraumatic subarachnoid hemorrhage, unspecified: Secondary | ICD-10-CM | POA: Diagnosis not present

## 2020-12-09 DIAGNOSIS — L8990 Pressure ulcer of unspecified site, unspecified stage: Secondary | ICD-10-CM | POA: Insufficient documentation

## 2020-12-09 LAB — HEPATIC FUNCTION PANEL
ALT: 17 U/L (ref 0–44)
AST: 19 U/L (ref 15–41)
Albumin: 3.3 g/dL — ABNORMAL LOW (ref 3.5–5.0)
Alkaline Phosphatase: 54 U/L (ref 38–126)
Bilirubin, Direct: 0.3 mg/dL — ABNORMAL HIGH (ref 0.0–0.2)
Indirect Bilirubin: 0.8 mg/dL (ref 0.3–0.9)
Total Bilirubin: 1.1 mg/dL (ref 0.3–1.2)
Total Protein: 6 g/dL — ABNORMAL LOW (ref 6.5–8.1)

## 2020-12-09 LAB — BASIC METABOLIC PANEL
Anion gap: 9 (ref 5–15)
BUN: 24 mg/dL — ABNORMAL HIGH (ref 8–23)
CO2: 21 mmol/L — ABNORMAL LOW (ref 22–32)
Calcium: 8.9 mg/dL (ref 8.9–10.3)
Chloride: 90 mmol/L — ABNORMAL LOW (ref 98–111)
Creatinine, Ser: 0.63 mg/dL (ref 0.44–1.00)
GFR, Estimated: >60 mL/min (ref >60)
Glucose, Bld: 103 mg/dL — ABNORMAL HIGH (ref 70–99)
Potassium: 4.6 mmol/L (ref 3.5–5.1)
Sodium: 120 mmol/L — ABNORMAL LOW (ref 135–145)

## 2020-12-09 LAB — SODIUM: Sodium: 120 mmol/L — ABNORMAL LOW (ref 135–145)

## 2020-12-09 MED ORDER — CHLORHEXIDINE GLUCONATE CLOTH 2 % EX PADS
6.0000 | MEDICATED_PAD | Freq: Every day | CUTANEOUS | Status: DC
  Administered 2020-12-09 – 2020-12-13 (×5): 6 via TOPICAL

## 2020-12-09 MED ORDER — ACETAMINOPHEN 500 MG PO TABS
1000.0000 mg | ORAL_TABLET | Freq: Four times a day (QID) | ORAL | Status: DC | PRN
  Administered 2020-12-09 – 2020-12-13 (×6): 1000 mg via ORAL
  Filled 2020-12-09 (×6): qty 2

## 2020-12-09 MED ORDER — TOLVAPTAN 15 MG PO TABS
15.0000 mg | ORAL_TABLET | Freq: Once | ORAL | Status: AC
  Administered 2020-12-09: 15 mg via ORAL
  Filled 2020-12-09: qty 1

## 2020-12-09 NOTE — Progress Notes (Signed)
Physical Therapy Treatment Patient Details Name: Ashley [name] MRN: [id number] DOB: Jan 01, 1931 Today's Date: 12/09/2020    History of Present Illness Pt is an 87 y/o F admitted on 12/05/20 with c/c of head trauma & LOC after falling. CT head concerning for subarachnoid hemorrhage, repeat CT with stable changes.  Neurosurgery recommending conservative management. Also found to have hyponatremia. PMH: HTN, HLD, CAD, COPD, depression, hx of stroke, anxiety, MI, PVD, GERD    PT Comments    Pt seen for PT treatment with PT educating pt's son on early intervention & mobility. Pt is limited by confusion & HOH during session. Pt requires significant assistance for bed mobility & STS EOB. Pt requires max multimodal cuing to perform exercises as noted below; also attempted to engage pt in heel slides & SLR but pt unable to follow verbal, visual & tactile cuing to do so. Upon STS attempt pt found to be incontinent of BM & returned supine. PT & NT provide dependent assist for peri hygiene. Will continue to follow pt acutely to progress transfers & strengthening as able.     Follow Up Recommendations  SNF     Equipment Recommendations  None recommended by PT    Recommendations for Other Services       Precautions / Restrictions Precautions Precautions: Fall Restrictions Weight Bearing Restrictions: No    Mobility  Bed Mobility Overal bed mobility: Needs Assistance Bed Mobility: Rolling;Supine to Sit;Sit to Supine Rolling: Total assist;+2 for physical assistance   Supine to sit: Max assist;HOB elevated Sit to supine: +2 for safety/equipment;Total assist;HOB elevated   General bed mobility comments: max mulitmodal cuing to initiate movement but pt with little ability to advance BLE to EOB, requires significant physical assistance, per son pt doesn't tolerate lying flat 2/2 feeling dizzy    Transfers Overall transfer level: Needs assistance Equipment used: Rolling walker (2  wheeled) Transfers: Sit to/from Stand Sit to Stand: Max assist;From elevated surface         General transfer comment: Pt with poor awareness re: safe hand placement as pt with BUE on RW. Pt with poor ability to activate glute/hip extensors & shift pelvis anteriorly to come to full upright standing, decreased ability to clear buttocks from elevated EOB.  Ambulation/Gait                 Stairs             Wheelchair Mobility    Modified Rankin (Stroke Patients Only)       Balance Overall balance assessment: Needs assistance Sitting-balance support: Bilateral upper extremity supported;Feet unsupported Sitting balance-Leahy Scale: Poor Sitting balance - Comments: requires close supervision<>min assist for sitting EOB, pt with increased posterior lean when performing BLE exercises seated EOB.                                    Cognition Arousal/Alertness: Awake/alert Behavior During Therapy: WFL for tasks assessed/performed Overall Cognitive Status: History of cognitive impairments - at baseline Area of Impairment: Orientation;Memory;Safety/judgement;Following commands;Problem solving                 Orientation Level: Disoriented to;Place;Time;Situation   Memory: Decreased short-term memory Following Commands: Follows one step commands inconsistently;Follows one step commands with increased time Safety/Judgement: Decreased awareness of deficits;Decreased awareness of safety   Problem Solving: Slow processing;Difficulty sequencing;Requires verbal cues;Requires tactile cues        Exercises General Exercises -  Lower Extremity Long Arc Quad: AROM;Strengthening;Right;Left;Both;10 reps;Seated (max multimodal cuing & demo for technique)    General Comments General comments (skin integrity, edema, etc.): HR up to 105 bpm with sitting EOB, digits of LUE beginning to appear flexed with son reporting her hand has "started doing that"       Pertinent Vitals/Pain Pain Assessment: Faces Faces Pain Scale: Hurts little more Pain Location: B ankles, R wrist Pain Descriptors / Indicators: Grimacing;Guarding Pain Intervention(s): Monitored during session;Repositioned    Home Living                      Prior Function            PT Goals (current goals can now be found in the care plan section) Acute Rehab PT Goals Patient Stated Goal: to go to rehab PT Goal Formulation: With family Time For Goal Achievement: 12/21/20 Potential to Achieve Goals: Fair Progress towards PT goals: Progressing toward goals    Frequency    Min 2X/week      PT Plan Current plan remains appropriate    Co-evaluation              AM-PAC PT "6 Clicks" Mobility   Outcome Measure  Help needed turning from your back to your side while in a flat bed without using bedrails?: Total Help needed moving from lying on your back to sitting on the side of a flat bed without using bedrails?: Total Help needed moving to and from a bed to a chair (including a wheelchair)?: Total Help needed standing up from a chair using your arms (e.g., wheelchair or bedside chair)?: Total   Help needed climbing 3-5 steps with a railing? : Total 6 Click Score: 5    End of Session Equipment Utilized During Treatment: Gait belt Activity Tolerance: Patient tolerated treatment well Patient left: in bed;with call bell/phone within reach;with family/visitor present;with bed alarm set   PT Visit Diagnosis: Unsteadiness on feet (R26.81);Muscle weakness (generalized) (M62.81);Difficulty in walking, not elsewhere classified (R26.2)     Time: 2395-3202 PT Time Calculation (min) (ACUTE ONLY): 25 min  Charges:  $Therapeutic Activity: 23-37 mins                     Lavone Nian, PT, DPT 12/09/20, 4:15 PM    Waunita Schooner 12/09/2020, 4:13 PM

## 2020-12-09 NOTE — Progress Notes (Signed)
Na level at 1920 is 120. Pharmacy informed of Na level. NO dose for tonight per pharmacy.

## 2020-12-09 NOTE — Care Management Important Message (Signed)
Important Message  Patient Details  Name: Ashley [name] MRN: [id number] Date of Birth: 04-Mar-1931   Medicare Important Message Given:  Yes     Dannette Barbara 12/09/2020, 1:39 PM

## 2020-12-09 NOTE — Plan of Care (Signed)
Pt slept all night. Pt was able to fall right back to sleep after interventions. Pt remains pleasantly confused. On RA. Vitals stable. Safety measures in place. Will continue to monitor.  Problem: Education: Goal: Knowledge of General Education information will improve Description: Including pain rating scale, medication(s)/side effects and non-pharmacologic comfort measures Outcome: Progressing   Problem: Clinical Measurements: Goal: Ability to maintain clinical measurements within normal limits will improve Outcome: Progressing Goal: Will remain free from infection Outcome: Progressing Goal: Diagnostic test results will improve Outcome: Progressing Goal: Respiratory complications will improve Outcome: Progressing Goal: Cardiovascular complication will be avoided Outcome: Progressing   Problem: Activity: Goal: Risk for activity intolerance will decrease Outcome: Progressing

## 2020-12-09 NOTE — Progress Notes (Signed)
[place], [place] 12/09/20  Subjective:   Ashley [name] is a 87 y.o. female with PMHX of HTN, hyperlipidemia, CAD, COPD, depression and stroke. She presented to the ED after falling, hitting her head and LOC. She reports getting up to answer the door and only remembers waking up on the floor. She reports starting Zoloft about 2 months ago and feeling week and lethargic since that time.   She was admitted with a subarachnoid hematoma.   Patient is seen today resting quietly in bed.  Alert and oriented with son at the bedside. She denies shortness of breath and chest pain Currently on room air She states she has eaten a small amount   Objective:  Vital signs in last 24 hours:  Temp:  [98 F (36.7 C)-98.7 F (37.1 C)] 98.6 F (37 C) (02/14 1504) Pulse Rate:  [70-90] 90 (02/14 1504) Resp:  [18-22] 20 (02/14 1504) BP: (90-140)/(54-73) 140/73 (02/14 1504) SpO2:  [93 %-97 %] 95 % (02/14 1504) Weight:  [78.6 kg] 78.6 kg (02/14 0507)  Weight change: 3.221 kg Filed Weights   12/07/20 0603 12/08/20 0409 12/09/20 0507  Weight: 76.7 kg 75.4 kg 78.6 kg    Intake/Output:    Intake/Output Summary (Last 24 hours) at 12/09/2020 1630 Last data filed at 12/09/2020 0600 Gross per 24 hour  Intake 480 ml  Output 600 ml  Net -120 ml   Physical Exam: General: NAD,   Head: Normocephalic, atraumatic. Moist oral mucosal membranes  Eyes: Anicteric,  Neck: Supple, trachea midline  Lungs:  Clear to auscultation  Heart: Regular rate and rhythm  Abdomen:  Soft, nontender,   Extremities:  no peripheral edema.  Neurologic: Alert and oriented  Skin: No lesions    Basic Metabolic Panel:  Recent Labs  Lab 12/07/20 0511 12/07/20 1643 12/08/20 0714 12/08/20 1636 12/09/20 0854  NA 124* 124* 123* 120* 120*  K 4.5 5.0 4.1 4.7 4.6  CL 90* 92* 92* 90* 90*  CO2 23 23 20* 23 21*  GLUCOSE 122* 107* 123* 108* 103*  BUN 24* 24* 15 22 24*  CREATININE 0.50 0.64 0.50  0.45 0.63  CALCIUM 9.4 9.2 9.3 8.8* 8.9     CBC: Recent Labs  Lab 12/05/20 1205 12/06/20 0338 12/07/20 0511  WBC 13.1* 8.3 8.0  NEUTROABS 11.4*  --   --   HGB 15.0 15.0 15.7*  HCT 45.6 45.0 45.9  MCV 86.7 86.4 85.5  PLT 231 226 238     No results found for: HEPBSAG, HEPBSAB, HEPBIGM    Microbiology:  Recent Results (from the past 240 hour(s))  Resp Panel by RT-PCR (Flu A&B, Covid) Nasopharyngeal Swab     Status: None   Collection Time: 12/05/20  6:59 PM   Specimen: Nasopharyngeal Swab; Nasopharyngeal(NP) swabs in vial transport medium  Result Value Ref Range Status   SARS Coronavirus 2 by RT PCR NEGATIVE NEGATIVE Final    Comment: (NOTE) SARS-CoV-2 target nucleic acids are NOT DETECTED.  The SARS-CoV-2 RNA is generally detectable in upper respiratory specimens during the acute phase of infection. The lowest concentration of SARS-CoV-2 viral copies this assay can detect is 138 copies/mL. A negative result does not preclude SARS-Cov-2 infection and should not be used as the sole basis for treatment or other patient management decisions. A negative result may occur with  improper specimen collection/handling, submission of specimen other than nasopharyngeal swab, presence of viral mutation(s) within the areas targeted by this assay, and inadequate number of viral copies(<138  copies/mL). A negative result must be combined with clinical observations, patient history, and epidemiological information. The expected result is Negative.  Fact Sheet for Patients:  EntrepreneurPulse.com.au  Fact Sheet for Healthcare Providers:  IncredibleEmployment.be  This test is no t yet approved or cleared by the [place] FDA and  has been authorized for detection and/or diagnosis of SARS-CoV-2 by FDA under an Emergency Use Authorization (EUA). This EUA will remain  in effect (meaning this test can be used) for the duration of the COVID-19  declaration under Section 564(b)(1) of the Act, 21 U.S.C.section 360bbb-3(b)(1), unless the authorization is terminated  or revoked sooner.       Influenza A by PCR NEGATIVE NEGATIVE Final   Influenza B by PCR NEGATIVE NEGATIVE Final    Comment: (NOTE) The Xpert Xpress SARS-CoV-2/FLU/RSV plus assay is intended as an aid in the diagnosis of influenza from Nasopharyngeal swab specimens and should not be used as a sole basis for treatment. Nasal washings and aspirates are unacceptable for Xpert Xpress SARS-CoV-2/FLU/RSV testing.  Fact Sheet for Patients: EntrepreneurPulse.com.au  Fact Sheet for Healthcare Providers: IncredibleEmployment.be  This test is not yet approved or cleared by the [place] FDA and has been authorized for detection and/or diagnosis of SARS-CoV-2 by FDA under an Emergency Use Authorization (EUA). This EUA will remain in effect (meaning this test can be used) for the duration of the COVID-19 declaration under Section 564(b)(1) of the Act, 21 U.S.C. section 360bbb-3(b)(1), unless the authorization is terminated or revoked.  Performed at [facility], [address] [place], [place] [address]     Coagulation Studies: No results for input(s): LABPROT, INR in the last 72 hours.  Urinalysis: No results for input(s): COLORURINE, LABSPEC, PHURINE, GLUCOSEU, HGBUR, BILIRUBINUR, KETONESUR, PROTEINUR, UROBILINOGEN, NITRITE, LEUKOCYTESUR in the last 72 hours.  Invalid input(s): APPERANCEUR    Imaging: No results found.   Medications:    . amLODipine  10 mg Oral Daily  . feeding supplement  237 mL Oral BID BM  . ferrous sulfate  325 mg Oral Q M,W,F  . Ipratropium-Albuterol  1 puff Inhalation QID  . isosorbide dinitrate  30 mg Oral BID  . lisinopril  10 mg Oral Daily  . metoprolol succinate  75 mg Oral Daily  . multivitamin with minerals  1 tablet Oral Daily  . pantoprazole  40 mg Oral BID  .  polyethylene glycol  17 g Oral Daily  . QUEtiapine  25 mg Oral QHS  . simvastatin  40 mg Oral QHS  . sodium chloride flush  10-40 mL Intracatheter Q12H  . vitamin B-12  1,000 mcg Oral Daily   ALPRAZolam, metoprolol tartrate, ondansetron **OR** ondansetron (ZOFRAN) IV, polyvinyl alcohol, sodium chloride flush  Assessment/ Plan:  87 y.o. female with  PMHX of HTN, hyperlipidemia, CAD, COPD, depression and stroke was admitted on 12/05/2020  Active Problems:   Subarachnoid hematoma (HCC)   SAH (subarachnoid hemorrhage) ([place])   Fall   Acute hyponatremia   Pressure injury of skin  Acute hyponatremia [E87.1] SAH (subarachnoid hemorrhage) ([place]) [I60.9] Subarachnoid hematoma ([place]) [S06.6X9A] Fall, initial encounter [W19.XXXA] Subarachnoid hemorrhage ([place]) [I60.9]  #. Hyponatremia -monitoring mental status and it's relation to hyponatremia -Uric acid-2.7 -Tolvaptan 15mg  prescribed  #. Hypertension -BP controlled -Currently prescribed Amlodipine, lisinopril, and metoprolol   #. Diastolic CHF Chronic and not in acute exacerbation  Primary team will manage   #. Diabetes type 2 with CKD Hgb A1c MFr Bld (%)  Date Value  07/13/2020 5.2  LOS: [place] 2/14/20224:30 PM  [place], [place]

## 2020-12-09 NOTE — Progress Notes (Signed)
PROGRESS NOTE    [name]  XNA:[id number] DOB: 07-16-1931 DOA: 12/05/2020 PCP: Albina Billet, MD   Brief Narrative: Taken from H&P. Ashley [name] is a 87 y.o. female with medical history significant for hypertension, hyperlipidemia, CAD, COPD, depression, history of stroke presents to the ED for chief concern of falling with head trauma and loss of consciousness.  She reports that a doorbell ring as she was getting up from her chair to answer the door and the next thing she remembers she woke up and she was laying on her back on the floor.  She denies presyncopal feelings of chest pain, shortness of breath, dizziness.  She endorses that in the last couple of months she has been feeling very weak.  She notes that Zoloft was started a few months ago and since then she has felt very lethargic and weak.  CT head concerning for subarachnoid hemorrhage, repeat CT with stable changes.  Neurosurgery recommending conservative management. Also found to have hyponatremia.  Repeated another CT when she was becoming more agitated and it shows resolution of prior findings and no new abnormality.  Subjective: Patient was resting comfortably when seen today.  No family at bedside at that time, son came data later.  Denies any complaint. Bladder scan was obtained later in the day for concern of urinary retention found to have more than 500 cc-Foley catheter was placed by nephrology.  Assessment & Plan:   Active Problems:   Subarachnoid hematoma (HCC)   SAH (subarachnoid hemorrhage) (HCC)   Fall   Acute hyponatremia   Pressure injury of skin  Subarachnoid hemorrhage. Neurosurgery was consulted and they are suspecting secondary to trauma and not due to aneurysmal rupture. Repeat CT head was negative for any progression. No focal deficit. Per neurosurgery there is no need for any further follow-up. Repeat CT head today when she become more agitated was negative for any new abnormality, shows  rather some resolution of prior bleeding. -Keep holding aspirin for another 3 days, can resume on 12/12/2020.  Hyponatremia. Patient was recently started on Zoloft and experiencing generalized weakness since then. Also endorsed low-sodium diet. No change in sodium with IV normal saline. -Hyponatremia labs with low serum osmolality at 268, normal urine osmolality and urinary sodium of 27.  Most likely secondary to Zoloft. There was not much change in her sodium level after discontinuing Zoloft. Not much response to IV fluid or Lasix. Nephrology was consulted and plan is to try tolvaptan today. Discontinue spironolactone-nephrology discussed with her cardiologist. -Uric acid within normal limit -Monitor sodium  Generalized weakness. Might be secondary to hyponatremia. -PT/OT evaluation-recommending SNF placement, family is in agreeable. -TOC consult for placement  Chronic HFpEF. Patient appears euvolemic. Echocardiogram done in September 2021 with grade 1 diastolic dysfunction. -Continue home dose of lisinopril, metoprolol. -Parona lactone was discontinued today due to persistent hyponatremia, can add loop diuretics if needed.  Essential hypertension. Blood pressure within goal -Continue home dose of amlodipine, isosorbide dinitrate, lisinopril, metoprolol.  History of anxiety/depression. No acute concern Discontinue home dose of Zoloft due to hyponatremia. Continue home dose of alprazolam as needed.  Sundowning.  Patient is exhibiting signs of sundowning which is very common in this age group. -We will add low-dose Seroquel at night  Objective: Vitals:   12/09/20 1150 12/09/20 1153 12/09/20 1154 12/09/20 1504  BP:  107/62 107/62 140/73  Pulse:  74  90  Resp: 20   20  Temp:    98.6 F (37 C)  TempSrc:    Oral  SpO2:    95%  Weight:      Height:        Intake/Output Summary (Last 24 hours) at 12/09/2020 1559 Last data filed at 12/09/2020 0600 Gross per 24 hour  Intake 480  ml  Output 600 ml  Net -120 ml   Filed Weights   12/07/20 0603 12/08/20 0409 12/09/20 0507  Weight: 76.7 kg 75.4 kg 78.6 kg    Examination:  General.  Obese elderly lady, in no acute distress. Pulmonary.  Lungs clear bilaterally, normal respiratory effort. CV.  Regular rate and rhythm, no JVD, rub or murmur. Abdomen.  Soft, nontender, nondistended, BS positive. CNS.  Alert and oriented x3.  No focal neurologic deficit. Extremities.  No edema, no cyanosis, pulses intact and symmetrical. Psychiatry.  Judgment and insight appears normal.  DVT prophylaxis: SCDs Code Status: DNR Family Communication: Son was updated at bedside Disposition Plan:  Status is: Inpatient  Remains inpatient appropriate because:Inpatient level of care appropriate due to severity of illness   Dispo: The patient is from: Home              Anticipated d/c is to: SNF              Anticipated d/c date is: 1 day              Patient currently is medically stable.   Difficult to place patient No              Level of care: Med-Surg  Consultants:   Neurosurgery  Nephrology  Procedures:  Antimicrobials:   Data Reviewed: I have personally reviewed following labs and imaging studies  CBC: Recent Labs  Lab 12/05/20 1205 12/06/20 0338 12/07/20 0511  WBC 13.1* 8.3 8.0  NEUTROABS 11.4*  --   --   HGB 15.0 15.0 15.7*  HCT 45.6 45.0 45.9  MCV 86.7 86.4 85.5  PLT 231 226 099   Basic Metabolic Panel: Recent Labs  Lab 12/07/20 0511 12/07/20 1643 12/08/20 0714 12/08/20 1636 12/09/20 0854  NA 124* 124* 123* 120* 120*  K 4.5 5.0 4.1 4.7 4.6  CL 90* 92* 92* 90* 90*  CO2 23 23 20* 23 21*  GLUCOSE 122* 107* 123* 108* 103*  BUN 24* 24* 15 22 24*  CREATININE 0.50 0.64 0.50 0.45 0.63  CALCIUM 9.4 9.2 9.3 8.8* 8.9   GFR: Estimated Creatinine Clearance: 50.3 mL/min (by C-G formula based on SCr of 0.63 mg/dL). Liver Function Tests: Recent Labs  Lab 12/05/20 1205 12/09/20 0854  AST 22 19  ALT  15 17  ALKPHOS 59 54  BILITOT 1.1 1.1  PROT 7.1 6.0*  ALBUMIN 3.9 3.3*   No results for input(s): LIPASE, AMYLASE in the last 168 hours. No results for input(s): AMMONIA in the last 168 hours. Coagulation Profile: Recent Labs  Lab 12/05/20 1205  INR 1.0   Cardiac Enzymes: No results for input(s): CKTOTAL, CKMB, CKMBINDEX, TROPONINI in the last 168 hours. BNP (last 3 results) No results for input(s): PROBNP in the last 8760 hours. HbA1C: No results for input(s): HGBA1C in the last 72 hours. CBG: No results for input(s): GLUCAP in the last 168 hours. Lipid Profile: No results for input(s): CHOL, HDL, LDLCALC, TRIG, CHOLHDL, LDLDIRECT in the last 72 hours. Thyroid Function Tests: No results for input(s): TSH, T4TOTAL, FREET4, T3FREE, THYROIDAB in the last 72 hours. Anemia Panel: No results for input(s): VITAMINB12, FOLATE, FERRITIN, TIBC, IRON, RETICCTPCT in the last  72 hours. Sepsis Labs: No results for input(s): PROCALCITON, LATICACIDVEN in the last 168 hours.  Recent Results (from the past 240 hour(s))  Resp Panel by RT-PCR (Flu A&B, Covid) Nasopharyngeal Swab     Status: None   Collection Time: 12/05/20  6:59 PM   Specimen: Nasopharyngeal Swab; Nasopharyngeal(NP) swabs in vial transport medium  Result Value Ref Range Status   SARS Coronavirus 2 by RT PCR NEGATIVE NEGATIVE Final    Comment: (NOTE) SARS-CoV-2 target nucleic acids are NOT DETECTED.  The SARS-CoV-2 RNA is generally detectable in upper respiratory specimens during the acute phase of infection. The lowest concentration of SARS-CoV-2 viral copies this assay can detect is 138 copies/mL. A negative result does not preclude SARS-Cov-2 infection and should not be used as the sole basis for treatment or other patient management decisions. A negative result may occur with  improper specimen collection/handling, submission of specimen other than nasopharyngeal swab, presence of viral mutation(s) within the areas  targeted by this assay, and inadequate number of viral copies(<138 copies/mL). A negative result must be combined with clinical observations, patient history, and epidemiological information. The expected result is Negative.  Fact Sheet for Patients:  EntrepreneurPulse.com.au  Fact Sheet for Healthcare Providers:  IncredibleEmployment.be  This test is no t yet approved or cleared by the [place] FDA and  has been authorized for detection and/or diagnosis of SARS-CoV-2 by FDA under an Emergency Use Authorization (EUA). This EUA will remain  in effect (meaning this test can be used) for the duration of the COVID-19 declaration under Section 564(b)(1) of the Act, 21 U.S.C.section 360bbb-3(b)(1), unless the authorization is terminated  or revoked sooner.       Influenza A by PCR NEGATIVE NEGATIVE Final   Influenza B by PCR NEGATIVE NEGATIVE Final    Comment: (NOTE) The Xpert Xpress SARS-CoV-2/FLU/RSV plus assay is intended as an aid in the diagnosis of influenza from Nasopharyngeal swab specimens and should not be used as a sole basis for treatment. Nasal washings and aspirates are unacceptable for Xpert Xpress SARS-CoV-2/FLU/RSV testing.  Fact Sheet for Patients: EntrepreneurPulse.com.au  Fact Sheet for Healthcare Providers: IncredibleEmployment.be  This test is not yet approved or cleared by the [place] FDA and has been authorized for detection and/or diagnosis of SARS-CoV-2 by FDA under an Emergency Use Authorization (EUA). This EUA will remain in effect (meaning this test can be used) for the duration of the COVID-19 declaration under Section 564(b)(1) of the Act, 21 U.S.C. section 360bbb-3(b)(1), unless the authorization is terminated or revoked.  Performed at [facility], [address] [place], [place] [address]      Radiology Studies: No results found.  Scheduled  Meds: . amLODipine  10 mg Oral Daily  . feeding supplement  237 mL Oral BID BM  . ferrous sulfate  325 mg Oral Q M,W,F  . Ipratropium-Albuterol  1 puff Inhalation QID  . isosorbide dinitrate  30 mg Oral BID  . lisinopril  10 mg Oral Daily  . metoprolol succinate  75 mg Oral Daily  . multivitamin with minerals  1 tablet Oral Daily  . pantoprazole  40 mg Oral BID  . polyethylene glycol  17 g Oral Daily  . QUEtiapine  25 mg Oral QHS  . simvastatin  40 mg Oral QHS  . sodium chloride flush  10-40 mL Intracatheter Q12H  . vitamin B-12  1,000 mcg Oral Daily   Continuous Infusions:    LOS: 3 days   Time spent: 25 minutes.  Lorella Nimrod, MD [facility]  If 7PM-7AM, please contact night-coverage [email or web address]  12/09/2020, 3:59 PM   This record has been created using Systems analyst. Errors have been sought and corrected,but may not always be located. Such creation errors do not reflect on the standard of care.

## 2020-12-09 NOTE — Care Management Note (Signed)
RE: Ashley [name] Date of Birth: 07/15/1931 Date: 12/09/2020   To Whom It May Concern:  Please be advised that the above-named patient will require a short-term nursing home stay - anticipated 30 days or less for rehabilitation and strengthening.  The plan is for return home.

## 2020-12-10 DIAGNOSIS — W19XXXA Unspecified fall, initial encounter: Secondary | ICD-10-CM | POA: Diagnosis not present

## 2020-12-10 DIAGNOSIS — E871 Hypo-osmolality and hyponatremia: Secondary | ICD-10-CM | POA: Diagnosis not present

## 2020-12-10 DIAGNOSIS — I609 Nontraumatic subarachnoid hemorrhage, unspecified: Secondary | ICD-10-CM | POA: Diagnosis not present

## 2020-12-10 LAB — BASIC METABOLIC PANEL
Anion gap: 7 (ref 5–15)
BUN: 31 mg/dL — ABNORMAL HIGH (ref 8–23)
CO2: 25 mmol/L (ref 22–32)
Calcium: 9.4 mg/dL (ref 8.9–10.3)
Chloride: 94 mmol/L — ABNORMAL LOW (ref 98–111)
Creatinine, Ser: 0.99 mg/dL (ref 0.44–1.00)
GFR, Estimated: 55 mL/min — ABNORMAL LOW (ref >60)
Glucose, Bld: 108 mg/dL — ABNORMAL HIGH (ref 70–99)
Potassium: 4.9 mmol/L (ref 3.5–5.1)
Sodium: 126 mmol/L — ABNORMAL LOW (ref 135–145)

## 2020-12-10 LAB — CBC WITH DIFFERENTIAL/PLATELET
Abs Immature Granulocytes: 0.02 10*3/uL (ref 0.00–0.07)
Basophils Absolute: 0 10*3/uL (ref 0.0–0.1)
Basophils Relative: 1 %
Eosinophils Absolute: 0.2 10*3/uL (ref 0.0–0.5)
Eosinophils Relative: 3 %
HCT: 41.4 % (ref 36.0–46.0)
Hemoglobin: 13.9 g/dL (ref 12.0–15.0)
Immature Granulocytes: 0 %
Lymphocytes Relative: 22 %
Lymphs Abs: 1.4 10*3/uL (ref 0.7–4.0)
MCH: 29 pg (ref 26.0–34.0)
MCHC: 33.6 g/dL (ref 30.0–36.0)
MCV: 86.4 fL (ref 80.0–100.0)
Monocytes Absolute: 0.8 10*3/uL (ref 0.1–1.0)
Monocytes Relative: 13 %
Neutro Abs: 3.8 10*3/uL (ref 1.7–7.7)
Neutrophils Relative %: 61 %
Platelets: 200 10*3/uL (ref 150–400)
RBC: 4.79 MIL/uL (ref 3.87–5.11)
RDW: 13.2 % (ref 11.5–15.5)
WBC: 6.2 10*3/uL (ref 4.0–10.5)
nRBC: 0 % (ref 0.0–0.2)

## 2020-12-10 LAB — SODIUM: Sodium: 124 mmol/L — ABNORMAL LOW (ref 135–145)

## 2020-12-10 NOTE — TOC Progression Note (Signed)
Transition of Care [facility]) - Progression Note    Patient Details  Name: Ashley [name] MRN: [id number] Date of Birth: [birth year]  [facility] CM/SW Contact  Shelbie Hutching, RN Phone Number: 12/10/2020, 2:57 PM  Clinical Narrative:    Pas[place] [id number] E exp 3/17.    Expected Discharge Plan: [place] Barriers to Discharge: No Barriers Identified  Expected Discharge Plan and Services Expected Discharge Plan: [place] In-house Referral: Clinical Social Work   Post Acute Care Choice: [place] Living arrangements for the past 2 months: Single Family Home                                       Social Determinants of Health (SDOH) Interventions    Readmission Risk Interventions No flowsheet data found.

## 2020-12-10 NOTE — Progress Notes (Signed)
[place], [place] 12/10/20  Subjective:   Ashley [name] is a 87 y.o. female with PMHX of HTN, hyperlipidemia, CAD, COPD, depression and stroke. She presented to the ED after falling, hitting her head and LOC. She reports getting up to answer the door and only remembers waking up on the floor. She reports starting Zoloft about 2 months ago and feeling week and lethargic since that time.   She was admitted with a subarachnoid hematoma.   Patient says she is not feeling well today. She feels about the same as she did yesterday. She is alert and able to answer questions Able to eat meals without nausea Denies shortness of breath and chest discomfort.  UOP-2.2L  Objective:  Vital signs in last 24 hours:  Temp:  [97.4 F (36.3 C)-98.6 F (37 C)] 98 F (36.7 C) (02/15 0834) Pulse Rate:  [74-90] 75 (02/15 0834) Resp:  [14-20] 14 (02/15 0834) BP: (111-140)/(62-73) 132/66 (02/15 0834) SpO2:  [94 %-97 %] 97 % (02/15 0401) Weight:  [76.2 kg] 76.2 kg (02/14 1900)  Weight change: -2.449 kg Filed Weights   12/08/20 0409 12/09/20 0507 12/09/20 1900  Weight: 75.4 kg 78.6 kg 76.2 kg    Intake/Output:    Intake/Output Summary (Last 24 hours) at 12/10/2020 1303 Last data filed at 12/10/2020 1019 Gross per 24 hour  Intake 550 ml  Output 2950 ml  Net -2400 ml   Physical Exam: General: NAD,   Head: Normocephalic, atraumatic. dry oral mucosal membranes  Eyes: Anicteric  Neck: Supple, trachea midline  Lungs:  Clear to auscultation  Heart: Regular rate and rhythm  Abdomen:  Soft, nontender,   Extremities:  no peripheral edema.  Neurologic: Alert and oriented  Skin: No lesions    Basic Metabolic Panel:  Recent Labs  Lab 12/07/20 1643 12/08/20 0714 12/08/20 1636 12/09/20 0854 12/09/20 1920 12/10/20 0310 12/10/20 0830  NA 124* 123* 120* 120* 120* 124* 126*  K 5.0 4.1 4.7 4.6  --   --  4.9  CL 92* 92* 90* 90*  --   --  94*  CO2 23 20* 23 21*  --    --  25  GLUCOSE 107* 123* 108* 103*  --   --  108*  BUN 24* 15 22 24*  --   --  31*  CREATININE 0.64 0.50 0.45 0.63  --   --  0.99  CALCIUM 9.2 9.3 8.8* 8.9  --   --  9.4     CBC: Recent Labs  Lab 12/05/20 1205 12/06/20 0338 12/07/20 0511 12/10/20 0310  WBC 13.1* 8.3 8.0 6.2  NEUTROABS 11.4*  --   --  3.8  HGB 15.0 15.0 15.7* 13.9  HCT 45.6 45.0 45.9 41.4  MCV 86.7 86.4 85.5 86.4  PLT 231 226 238 200     No results found for: HEPBSAG, HEPBSAB, HEPBIGM    Microbiology:  Recent Results (from the past 240 hour(s))  Resp Panel by RT-PCR (Flu A&B, Covid) Nasopharyngeal Swab     Status: None   Collection Time: 12/05/20  6:59 PM   Specimen: Nasopharyngeal Swab; Nasopharyngeal(NP) swabs in vial transport medium  Result Value Ref Range Status   SARS Coronavirus 2 by RT PCR NEGATIVE NEGATIVE Final    Comment: (NOTE) SARS-CoV-2 target nucleic acids are NOT DETECTED.  The SARS-CoV-2 RNA is generally detectable in upper respiratory specimens during the acute phase of infection. The lowest concentration of SARS-CoV-2 viral copies this assay can detect is 138 copies/mL.  A negative result does not preclude SARS-Cov-2 infection and should not be used as the sole basis for treatment or other patient management decisions. A negative result may occur with  improper specimen collection/handling, submission of specimen other than nasopharyngeal swab, presence of viral mutation(s) within the areas targeted by this assay, and inadequate number of viral copies(<138 copies/mL). A negative result must be combined with clinical observations, patient history, and epidemiological information. The expected result is Negative.  Fact Sheet for Patients:  EntrepreneurPulse.com.au  Fact Sheet for Healthcare Providers:  IncredibleEmployment.be  This test is no t yet approved or cleared by the [place] FDA and  has been authorized for detection and/or  diagnosis of SARS-CoV-2 by FDA under an Emergency Use Authorization (EUA). This EUA will remain  in effect (meaning this test can be used) for the duration of the COVID-19 declaration under Section 564(b)(1) of the Act, 21 U.S.C.section 360bbb-3(b)(1), unless the authorization is terminated  or revoked sooner.       Influenza A by PCR NEGATIVE NEGATIVE Final   Influenza B by PCR NEGATIVE NEGATIVE Final    Comment: (NOTE) The Xpert Xpress SARS-CoV-2/FLU/RSV plus assay is intended as an aid in the diagnosis of influenza from Nasopharyngeal swab specimens and should not be used as a sole basis for treatment. Nasal washings and aspirates are unacceptable for Xpert Xpress SARS-CoV-2/FLU/RSV testing.  Fact Sheet for Patients: EntrepreneurPulse.com.au  Fact Sheet for Healthcare Providers: IncredibleEmployment.be  This test is not yet approved or cleared by the [place] FDA and has been authorized for detection and/or diagnosis of SARS-CoV-2 by FDA under an Emergency Use Authorization (EUA). This EUA will remain in effect (meaning this test can be used) for the duration of the COVID-19 declaration under Section 564(b)(1) of the Act, 21 U.S.C. section 360bbb-3(b)(1), unless the authorization is terminated or revoked.  Performed at [facility], [address] [place], [place] [address]     Coagulation Studies: No results for input(s): LABPROT, INR in the last 72 hours.  Urinalysis: No results for input(s): COLORURINE, LABSPEC, PHURINE, GLUCOSEU, HGBUR, BILIRUBINUR, KETONESUR, PROTEINUR, UROBILINOGEN, NITRITE, LEUKOCYTESUR in the last 72 hours.  Invalid input(s): APPERANCEUR    Imaging: No results found.   Medications:    . amLODipine  10 mg Oral Daily  . Chlorhexidine Gluconate Cloth  6 each Topical Daily  . feeding supplement  237 mL Oral BID BM  . ferrous sulfate  325 mg Oral Q M,W,F  . Ipratropium-Albuterol  1 puff  Inhalation QID  . isosorbide dinitrate  30 mg Oral BID  . lisinopril  10 mg Oral Daily  . metoprolol succinate  75 mg Oral Daily  . multivitamin with minerals  1 tablet Oral Daily  . pantoprazole  40 mg Oral BID  . polyethylene glycol  17 g Oral Daily  . QUEtiapine  25 mg Oral QHS  . simvastatin  40 mg Oral QHS  . sodium chloride flush  10-40 mL Intracatheter Q12H  . vitamin B-12  1,000 mcg Oral Daily   acetaminophen, ALPRAZolam, metoprolol tartrate, ondansetron **OR** ondansetron (ZOFRAN) IV, polyvinyl alcohol, sodium chloride flush  Assessment/ Plan:  87 y.o. female with  PMHX of HTN, hyperlipidemia, CAD, COPD, depression and stroke was admitted on 12/05/2020  Active Problems:   Subarachnoid hematoma (HCC)   SAH (subarachnoid hemorrhage) ([place])   Fall   Acute hyponatremia   Pressure injury of skin  Acute hyponatremia [E87.1] SAH (subarachnoid hemorrhage) ([place]) [I60.9] Subarachnoid hematoma ([place]) [S06.6X9A] Fall, initial encounter [  W19.XXXA] Subarachnoid hemorrhage ([place]) [I60.9]  #. Hyponatremia -Current level-126 -Tolvaptan 15mg  prescribed yesterday  #. Hypertension -improved BP control on current treatment -Currently prescribed Amlodipine, lisinopril, and metoprolol   #. Diastolic CHF Not acute exacerbation  Primary team will manage   #. Diabetes type 2 with CKD Hgb A1c MFr Bld (%)  Date Value  07/13/2020 5.2  Acceptable glucose levels    LOS: 4  ,NP 2/15/20221:03 PM  [facility] [place], [place]

## 2020-12-10 NOTE — TOC Progression Note (Signed)
Transition of Care [facility]) - Progression Note    Patient Details  Name: Ashley [name] MRN: [id number] Date of Birth: [birth year]  [facility] CM/SW Contact  Shelbie Hutching, RN Phone Number: 12/10/2020, 2:09 PM  Clinical Narrative:    RNCM met with patient and son at the bedside to present bed offers.  [facility], [facility], and [facility] have offered a bed- Son will [employer] [facility] and [facility] they definitely do not want [facility] or [facility].  RNCM will follow up with patient and son in the morning.    Expected Discharge Plan: [place] Barriers to Discharge: No Barriers Identified  Expected Discharge Plan and Services Expected Discharge Plan: [place] In-house Referral: Clinical Social Work   Post Acute Care Choice: [place] Living arrangements for the past 2 months: Single Family Home                                       Social Determinants of Health (SDOH) Interventions    Readmission Risk Interventions No flowsheet data found.

## 2020-12-10 NOTE — Progress Notes (Signed)
Physical Therapy Treatment Patient Details Name: Ashley [name] MRN: [id number] DOB: 02/17/1931 Today's Date: 12/10/2020    History of Present Illness Pt is an 87 y/o F admitted on 12/05/20 with c/c of head trauma & LOC after falling. CT head concerning for subarachnoid hemorrhage, repeat CT with stable changes.  Neurosurgery recommending conservative management. Also found to have hyponatremia. PMH: HTN, HLD, CAD, COPD, depression, hx of stroke, anxiety, MI, PVD, GERD    PT Comments    Pt continues to be far from her baseline, but was able to do much more than on any previous PT sessions on this admit. She continues to be very confused, but per son she is much closer to baseline today than recently.  She struggled to show a lot of functional bed mobility, but did a great job assisting with getting to standing and despite very labored and slow effort she did manage to take fwd/back/side steps with varying assist from clsoe CGA to mod assist. Pt still clearly needing STR, but showed good improvement today.  Follow Up Recommendations  SNF     Equipment Recommendations  Other (comment) (youth height walker)    Recommendations for Other Services       Precautions / Restrictions Precautions Precautions: Fall Restrictions Weight Bearing Restrictions: No    Mobility  Bed Mobility Overal bed mobility: Needs Assistance Bed Mobility: Supine to Sit;Sit to Supine     Supine to sit: Max assist Sit to supine: Max assist   General bed mobility comments: Pt unable to organize any movement toward EOB, sleeps in recliner at home so never has to get in/out of bed    Transfers Overall transfer level: Needs assistance Equipment used: Rolling walker (2 wheeled) Transfers: Sit to/from Stand Sit to Stand: Mod assist         General transfer comment: Pt able to help scoot to EOB to set up standing, then able to give some assist with heavy cuing.  She did manage to help shift forward but needed  considerable assist to actually rise to standing (2 seperated standing bouts)  Ambulation/Gait             General Gait Details: No true ambulation, but pt managed to take a few side shuffle steps to L along EOB on first standing effort, 2 small forward and backward steps with additional side stepping on second effort.   Stairs             Wheelchair Mobility    Modified Rankin (Stroke Patients Only)       Balance Overall balance assessment: Needs assistance Sitting-balance support: Bilateral upper extremity supported;Feet unsupported Sitting balance-Leahy Scale: Fair Sitting balance - Comments: once assited to EOB able to maintain, close guarding as she did have tendancy to lean forward at times     Standing balance-Leahy Scale: Poor Standing balance comment: highly reliant on the walker, with constant hands on and verbal cuing to insure safe effort/UE use on walker                            Cognition Arousal/Alertness: Awake/alert Behavior During Therapy: WFL for tasks assessed/performed Overall Cognitive Status: History of cognitive impairments - at baseline                                        Exercises  General Comments        Pertinent Vitals/Pain Pain Assessment: Faces Faces Pain Scale: Hurts little more Pain Location: general minimal pain with most palpation, does not appear specifically local    Home Living                      Prior Function            PT Goals (current goals can now be found in the care plan section) Progress towards PT goals: Progressing toward goals    Frequency    Min 2X/week      PT Plan Current plan remains appropriate    Co-evaluation              AM-PAC PT "6 Clicks" Mobility   Outcome Measure  Help needed turning from your back to your side while in a flat bed without using bedrails?: Total Help needed moving from lying on your back to sitting on the  side of a flat bed without using bedrails?: Total Help needed moving to and from a bed to a chair (including a wheelchair)?: Total Help needed standing up from a chair using your arms (e.g., wheelchair or bedside chair)?: Total Help needed to walk in hospital room?: Total Help needed climbing 3-5 steps with a railing? : Total 6 Click Score: 6    End of Session Equipment Utilized During Treatment: Gait belt Activity Tolerance: Patient tolerated treatment well Patient left: in bed;with call bell/phone within reach;with family/visitor present;with bed alarm set   PT Visit Diagnosis: Unsteadiness on feet (R26.81);Muscle weakness (generalized) (M62.81);Difficulty in walking, not elsewhere classified (R26.2)     Time: 2297-9892 PT Time Calculation (min) (ACUTE ONLY): 28 min  Charges:  $Therapeutic Activity: 23-37 mins                     Kreg Shropshire, DPT 12/10/2020, 5:53 PM

## 2020-12-10 NOTE — Progress Notes (Addendum)
Na level at 0310 resulted to 124. Pharmacy c/o Corene Cornea notified. Corene Cornea to check records and will update RN .  Addendum:  Per Corene Cornea, he will inform Dr. Abigail Butts in AM if there is a need for further dosing. NP on call notified.

## 2020-12-10 NOTE — Progress Notes (Signed)
Received pt from another unit, transported via bed accompanied by son and transport personnel. Pt is aware, oriented to person, time and place but is forgetful. Able to follow commands. Pt c/o of leg pain and is requesting for tylenol. Call bell placed within reach. Bed alarm activated. Orientation given. Will monitor.

## 2020-12-10 NOTE — Progress Notes (Signed)
PROGRESS NOTE    Ashley [name]  ZCH:[id number] DOB: [birth year] DOA: 12/05/2020 PCP: Albina Billet, MD   Brief Narrative: Taken from H&P. Ashley [name] is a 87 y.o. female with medical history significant for hypertension, hyperlipidemia, CAD, COPD, depression, history of stroke presents to the ED for chief concern of falling with head trauma and loss of consciousness.  She reports that a doorbell ring as she was getting up from her chair to answer the door and the next thing she remembers she woke up and she was laying on her back on the floor.  She denies presyncopal feelings of chest pain, shortness of breath, dizziness.  She endorses that in the last couple of months she has been feeling very weak.  She notes that Zoloft was started a few months ago and since then she has felt very lethargic and weak.  CT head concerning for subarachnoid hemorrhage, repeat CT with stable changes.  Neurosurgery recommending conservative management. Also found to have hyponatremia.  Repeated another CT when she was becoming more agitated and it shows resolution of prior findings and no new abnormality.  Persistent hyponatremia, nephrology was consulted and she received 1 dose of tolvaptan yesterday, another repeat today.  Subjective: Patient was sleeping when seen today.  Son at bedside.  He does not want me to wake her up but stating that she continues to feel weak.  Able to take meals, no nausea or vomiting.  Per son she is feeling little dry and more thirsty which is a very common side effect of tolvaptan.  Assessment & Plan:   Active Problems:   Subarachnoid hematoma (HCC)   SAH (subarachnoid hemorrhage) (HCC)   Fall   Acute hyponatremia   Pressure injury of skin  Subarachnoid hemorrhage. Neurosurgery was consulted and they are suspecting secondary to trauma and not due to aneurysmal rupture. Repeat CT head was negative for any progression. No focal deficit. Per neurosurgery there is no  need for any further follow-up. Repeat CT head today when she become more agitated was negative for any new abnormality, shows rather some resolution of prior bleeding. -Keep holding aspirin for another 2 days, can resume on 12/12/2020.  Hyponatremia. Patient was recently started on Zoloft and experiencing generalized weakness since then. Also endorsed low-sodium diet. No change in sodium with IV normal saline. Hyponatremia labs with low serum osmolality at 268, normal urine osmolality and urinary sodium of 27.  Most likely secondary to Zoloft. Uric acid within normal limit Spironolactone was also discontinued after discussing with cardiology with no change in her sodium. Nephrology was consulted. Sodium started improving with tolvaptan, most recent was 126. Plan is to repeat another dose today. -Continue to monitor sodium  Generalized weakness. Might be secondary to hyponatremia. -PT/OT evaluation-recommending SNF placement, family is in agreeable. -TOC consult for placement  Chronic HFpEF. Patient appears euvolemic. Echocardiogram done in September 2021 with grade 1 diastolic dysfunction. -Continue home dose of lisinopril, metoprolol. -Spironolactone was discontinued due to persistent hyponatremia, can add loop diuretics if needed.  Essential hypertension. Blood pressure within goal -Continue home dose of amlodipine, isosorbide dinitrate, lisinopril, metoprolol.  History of anxiety/depression. No acute concern Discontinue home dose of Zoloft due to hyponatremia. Continue home dose of alprazolam as needed.  Sundowning.  Patient is exhibiting signs of sundowning which is very common in this age group.  Some improvement after adding Seroquel. -Continue low-dose Seroquel at night  Objective: Vitals:   12/10/20 0133 12/10/20 0401 12/10/20 0834 12/10/20 1337  BP: 111/62 124/63 132/66 (!) 113/58  Pulse: 74 74 75 79  Resp: 16 20 14 15   Temp: 97.8 F (36.6 C) (!) 97.4 F (36.3 C) 98  F (36.7 C) 98.3 F (36.8 C)  TempSrc:  Oral Oral   SpO2: 95% 97%  96%  Weight:      Height:        Intake/Output Summary (Last 24 hours) at 12/10/2020 1339 Last data filed at 12/10/2020 1019 Gross per 24 hour  Intake 550 ml  Output 2950 ml  Net -2400 ml   Filed Weights   12/08/20 0409 12/09/20 0507 12/09/20 1900  Weight: 75.4 kg 78.6 kg 76.2 kg    Examination:  General.  Well-developed elderly lady, in no acute distress. Pulmonary.  Lungs clear bilaterally, normal respiratory effort. CV.  Regular rate and rhythm, no JVD, rub or murmur. Abdomen.  Soft, nontender, nondistended, BS positive. CNS.  Alert and oriented x3.  No focal neurologic deficit. Extremities.  No edema, no cyanosis, pulses intact and symmetrical. Psychiatry.  Judgment and insight appears normal.  DVT prophylaxis: SCDs Code Status: DNR Family Communication: Son was updated at bedside Disposition Plan:  Status is: Inpatient  Remains inpatient appropriate because:Inpatient level of care appropriate due to severity of illness   Dispo: The patient is from: Home              Anticipated d/c is to: SNF              Anticipated d/c date is: 1 day              Patient currently is medically stable.   Difficult to place patient No              Level of care: Med-Surg  Consultants:   Neurosurgery  Nephrology  Procedures:  Antimicrobials:   Data Reviewed: I have personally reviewed following labs and imaging studies  CBC: Recent Labs  Lab 12/05/20 1205 12/06/20 0338 12/07/20 0511 12/10/20 0310  WBC 13.1* 8.3 8.0 6.2  NEUTROABS 11.4*  --   --  3.8  HGB 15.0 15.0 15.7* 13.9  HCT 45.6 45.0 45.9 41.4  MCV 86.7 86.4 85.5 86.4  PLT 231 226 238 191   Basic Metabolic Panel: Recent Labs  Lab 12/07/20 1643 12/08/20 0714 12/08/20 1636 12/09/20 0854 12/09/20 1920 12/10/20 0310 12/10/20 0830  NA 124* 123* 120* 120* 120* 124* 126*  K 5.0 4.1 4.7 4.6  --   --  4.9  CL 92* 92* 90* 90*  --   --   94*  CO2 23 20* 23 21*  --   --  25  GLUCOSE 107* 123* 108* 103*  --   --  108*  BUN 24* 15 22 24*  --   --  31*  CREATININE 0.64 0.50 0.45 0.63  --   --  0.99  CALCIUM 9.2 9.3 8.8* 8.9  --   --  9.4   GFR: Estimated Creatinine Clearance: 38.2 mL/min (by C-G formula based on SCr of 0.99 mg/dL). Liver Function Tests: Recent Labs  Lab 12/05/20 1205 12/09/20 0854  AST 22 19  ALT 15 17  ALKPHOS 59 54  BILITOT 1.1 1.1  PROT 7.1 6.0*  ALBUMIN 3.9 3.3*   No results for input(s): LIPASE, AMYLASE in the last 168 hours. No results for input(s): AMMONIA in the last 168 hours. Coagulation Profile: Recent Labs  Lab 12/05/20 1205  INR 1.0   Cardiac Enzymes: No results for input(s):  CKTOTAL, CKMB, CKMBINDEX, TROPONINI in the last 168 hours. BNP (last 3 results) No results for input(s): PROBNP in the last 8760 hours. HbA1C: No results for input(s): HGBA1C in the last 72 hours. CBG: No results for input(s): GLUCAP in the last 168 hours. Lipid Profile: No results for input(s): CHOL, HDL, LDLCALC, TRIG, CHOLHDL, LDLDIRECT in the last 72 hours. Thyroid Function Tests: No results for input(s): TSH, T4TOTAL, FREET4, T3FREE, THYROIDAB in the last 72 hours. Anemia Panel: No results for input(s): VITAMINB12, FOLATE, FERRITIN, TIBC, IRON, RETICCTPCT in the last 72 hours. Sepsis Labs: No results for input(s): PROCALCITON, LATICACIDVEN in the last 168 hours.  Recent Results (from the past 240 hour(s))  Resp Panel by RT-PCR (Flu A&B, Covid) Nasopharyngeal Swab     Status: None   Collection Time: 12/05/20  6:59 PM   Specimen: Nasopharyngeal Swab; Nasopharyngeal(NP) swabs in vial transport medium  Result Value Ref Range Status   SARS Coronavirus 2 by RT PCR NEGATIVE NEGATIVE Final    Comment: (NOTE) SARS-CoV-2 target nucleic acids are NOT DETECTED.  The SARS-CoV-2 RNA is generally detectable in upper respiratory specimens during the acute phase of infection. The lowest concentration of  SARS-CoV-2 viral copies this assay can detect is 138 copies/mL. A negative result does not preclude SARS-Cov-2 infection and should not be used as the sole basis for treatment or other patient management decisions. A negative result may occur with  improper specimen collection/handling, submission of specimen other than nasopharyngeal swab, presence of viral mutation(s) within the areas targeted by this assay, and inadequate number of viral copies(<138 copies/mL). A negative result must be combined with clinical observations, patient history, and epidemiological information. The expected result is Negative.  Fact Sheet for Patients:  EntrepreneurPulse.com.au  Fact Sheet for Healthcare Providers:  IncredibleEmployment.be  This test is no t yet approved or cleared by the [place] FDA and  has been authorized for detection and/or diagnosis of SARS-CoV-2 by FDA under an Emergency Use Authorization (EUA). This EUA will remain  in effect (meaning this test can be used) for the duration of the COVID-19 declaration under Section 564(b)(1) of the Act, 21 U.S.C.section 360bbb-3(b)(1), unless the authorization is terminated  or revoked sooner.       Influenza A by PCR NEGATIVE NEGATIVE Final   Influenza B by PCR NEGATIVE NEGATIVE Final    Comment: (NOTE) The Xpert Xpress SARS-CoV-2/FLU/RSV plus assay is intended as an aid in the diagnosis of influenza from Nasopharyngeal swab specimens and should not be used as a sole basis for treatment. Nasal washings and aspirates are unacceptable for Xpert Xpress SARS-CoV-2/FLU/RSV testing.  Fact Sheet for Patients: EntrepreneurPulse.com.au  Fact Sheet for Healthcare Providers: IncredibleEmployment.be  This test is not yet approved or cleared by the [place] FDA and has been authorized for detection and/or diagnosis of SARS-CoV-2 by FDA under an Emergency Use  Authorization (EUA). This EUA will remain in effect (meaning this test can be used) for the duration of the COVID-19 declaration under Section 564(b)(1) of the Act, 21 U.S.C. section 360bbb-3(b)(1), unless the authorization is terminated or revoked.  Performed at [facility], [address] [place],  [address]      Radiology Studies: No results found.  Scheduled Meds: . amLODipine  10 mg Oral Daily  . Chlorhexidine Gluconate Cloth  6 each Topical Daily  . feeding supplement  237 mL Oral BID BM  . ferrous sulfate  325 mg Oral Q M,W,F  . Ipratropium-Albuterol  1 puff Inhalation QID  .  isosorbide dinitrate  30 mg Oral BID  . lisinopril  10 mg Oral Daily  . metoprolol succinate  75 mg Oral Daily  . multivitamin with minerals  1 tablet Oral Daily  . pantoprazole  40 mg Oral BID  . polyethylene glycol  17 g Oral Daily  . QUEtiapine  25 mg Oral QHS  . simvastatin  40 mg Oral QHS  . sodium chloride flush  10-40 mL Intracatheter Q12H  . vitamin B-12  1,000 mcg Oral Daily   Continuous Infusions:    LOS: 4 days   Time spent: 25 minutes.  Lorella Nimrod, MD [facility]  If 7PM-7AM, please contact night-coverage [email or web address]  12/10/2020, 1:39 PM   This record has been created using Systems analyst. Errors have been sought and corrected,but may not always be located. Such creation errors do not reflect on the standard of care.

## 2020-12-11 DIAGNOSIS — E871 Hypo-osmolality and hyponatremia: Secondary | ICD-10-CM | POA: Diagnosis not present

## 2020-12-11 DIAGNOSIS — I2510 Atherosclerotic heart disease of native coronary artery without angina pectoris: Secondary | ICD-10-CM

## 2020-12-11 DIAGNOSIS — I5032 Chronic diastolic (congestive) heart failure: Secondary | ICD-10-CM

## 2020-12-11 DIAGNOSIS — F05 Delirium due to known physiological condition: Secondary | ICD-10-CM

## 2020-12-11 DIAGNOSIS — W19XXXA Unspecified fall, initial encounter: Secondary | ICD-10-CM | POA: Diagnosis not present

## 2020-12-11 DIAGNOSIS — I609 Nontraumatic subarachnoid hemorrhage, unspecified: Secondary | ICD-10-CM

## 2020-12-11 DIAGNOSIS — S066X0A Traumatic subarachnoid hemorrhage without loss of consciousness, initial encounter: Secondary | ICD-10-CM

## 2020-12-11 DIAGNOSIS — R531 Weakness: Secondary | ICD-10-CM

## 2020-12-11 LAB — POTASSIUM: Potassium: 5.7 mmol/L — ABNORMAL HIGH (ref 3.5–5.1)

## 2020-12-11 LAB — BASIC METABOLIC PANEL
Anion gap: 10 (ref 5–15)
BUN: 44 mg/dL — ABNORMAL HIGH (ref 8–23)
CO2: 23 mmol/L (ref 22–32)
Calcium: 9.5 mg/dL (ref 8.9–10.3)
Chloride: 96 mmol/L — ABNORMAL LOW (ref 98–111)
Creatinine, Ser: 1.29 mg/dL — ABNORMAL HIGH (ref 0.44–1.00)
GFR, Estimated: 40 mL/min — ABNORMAL LOW (ref >60)
Glucose, Bld: 112 mg/dL — ABNORMAL HIGH (ref 70–99)
Potassium: 5.3 mmol/L — ABNORMAL HIGH (ref 3.5–5.1)
Sodium: 129 mmol/L — ABNORMAL LOW (ref 135–145)

## 2020-12-11 LAB — MAGNESIUM: Magnesium: 2.1 mg/dL (ref 1.7–2.4)

## 2020-12-11 MED ORDER — SODIUM POLYSTYRENE SULFONATE 15 GM/60ML PO SUSP
45.0000 g | Freq: Two times a day (BID) | ORAL | Status: DC
  Administered 2020-12-11 – 2020-12-12 (×2): 45 g via ORAL
  Filled 2020-12-11 (×2): qty 180

## 2020-12-11 MED ORDER — SODIUM ZIRCONIUM CYCLOSILICATE 10 G PO PACK
10.0000 g | PACK | Freq: Once | ORAL | Status: AC
  Administered 2020-12-11: 10 g via ORAL
  Filled 2020-12-11 (×2): qty 1

## 2020-12-11 NOTE — Progress Notes (Signed)
Occupational Therapy Treatment Patient Details Name: Ashley [name] MRN: [id number] DOB: May 31, 1931 Today's Date: 12/11/2020    History of present illness Pt is an 87 y/o F admitted on 12/05/20 with c/c of head trauma & LOC after falling. CT head concerning for subarachnoid hemorrhage, repeat CT with stable changes.  Neurosurgery recommending conservative management. Also found to have hyponatremia. PMH: HTN, HLD, CAD, COPD, depression, hx of stroke, anxiety, MI, PVD, GERD   OT comments  Pt seen for OT treatment this date to f/u re: safety with ADLs/ADL mobility. OT engages pt in below-listed exercise which pt tolerates well, but requires significant multi-modal cues to for form/pace/technique. In addition, OT engages pt in washing face and oral care with SETUP to MIN A. Pt requires MOD A for sit to stand with RW and MAX to TOTAL A to stand-pivot to chair with MAX verbal cues for sequence/motor planning. Pt left in chair with chair alarm with all needs in reach. OT starts pt off with feeding tasks with SETUP and she requires MIN A and cues from her son who is present in room throughout, to continue to engage in self-feeding. Continue to recommend STR to f/u after acute stay as pt has decreased strength and activity tolerance.    Follow Up Recommendations  SNF;Supervision/Assistance - 24 hour    Equipment Recommendations  Other (comment) (defer)    Recommendations for Other Services      Precautions / Restrictions Precautions Precautions: Fall Restrictions Weight Bearing Restrictions: No       Mobility Bed Mobility Overal bed mobility: Needs Assistance Bed Mobility: Supine to Sit     Supine to sit: Max assist;HOB elevated     General bed mobility comments: cues to sequence task, use of draw sheet.  Transfers Overall transfer level: Needs assistance Equipment used: Rolling walker (2 wheeled) Transfers: Sit to/from [facility] Sit to Stand: Mod assist Stand  pivot transfers: Max assist;Total assist       General transfer comment: pt with difficulty motor planning/sequencing pivot to chair    Balance Overall balance assessment: Needs assistance Sitting-balance support: Bilateral upper extremity supported;Feet unsupported Sitting balance-Leahy Scale: Fair Sitting balance - Comments: pt requires MIN A initially with progress to SBA for static sitting with UE support.     Standing balance-Leahy Scale: Poor Standing balance comment: MOD/MAX A with RW to sustain static stand. F-/P+ stanfing balance.                           ADL either performed or assessed with clinical judgement   ADL Overall ADL's : Needs assistance/impaired Eating/Feeding: Set up;Cueing for sequencing Eating/Feeding Details (indicate cue type and reason): seated in chair, assist to open packaging and cues to initiate and complete task. Grooming: Wash/dry face;Oral care;Set up;Cueing for sequencing;Sitting;Minimal assistance Grooming Details (indicate cue type and reason): cues for thorough completion and MIN A to apply lotion to face after washing with wash cloth                                     Vision Patient Visual Report: No change from baseline     Perception     Praxis      Cognition Arousal/Alertness: Awake/alert Behavior During Therapy: WFL for tasks assessed/performed Overall Cognitive Status: History of cognitive impairments - at baseline Area of Impairment: Orientation;Memory;Safety/judgement;Following commands;Problem solving  Orientation Level: Disoriented to;Place;Time;Situation   Memory: Decreased short-term memory Following Commands: Follows one step commands inconsistently;Follows one step commands with increased time Safety/Judgement: Decreased awareness of deficits;Decreased awareness of safety   Problem Solving: Slow processing;Difficulty sequencing;Requires verbal cues;Requires tactile  cues General Comments: Pt is pleasant and cooperative throughout, requires slightly increased cueing this date versus initial eval. Somewhat sleepy/drowsy throughout session. Son reports that pt had a rough night.        Exercises General Exercises - Upper Extremity Shoulder Flexion: AROM;10 reps (x2 sets with moderate visual/verbal cues to sequence/attend) Antenatal Exercises Ankle Circles/Pumps: AROM;10 reps (x2 sets in prep for transfers as pt c/o slight dizziness upon initially coming to sitting.) Other Exercises Other Exercises: OT engages pt in seated ankle pumps EOB, FWD punches seated in chair, washing face and oral care with SETUP to MIN A. Pt requires MOD A for sit to stand with RW and MAX to TOTAL A to stand-pivot to chair with MAX verbal cues for sequence/motor planning.   Shoulder Instructions       General Comments      Pertinent Vitals/ Pain       Pain Assessment: Faces Faces Pain Scale: Hurts little more Pain Location: slight grunt/grimmace with mobilizing and c/o stiffness in her LEs and R UE. Pain Descriptors / Indicators: Grimacing;Guarding Pain Intervention(s): Monitored during session;Repositioned  Home Living                                   Additional Comments: lives in [facility] with ramped entrance and her children alone with PCAs rotate care for pt including IADL assist. However, pt is usually able to reasonably care for her basic ADLs until recently which has prompted pt and family to consider STR at discharge      Prior Functioning/Environment              Frequency  Min 1X/week        Progress Toward Goals  OT Goals(current goals can now be found in the care plan section)  Progress towards OT goals: Progressing toward goals  Acute Rehab OT Goals Patient Stated Goal: to go to rehab OT Goal Formulation: With patient/family Time For Goal Achievement: 12/21/20 Potential to Achieve Goals: Good  Plan Discharge plan remains  appropriate    Co-evaluation                 AM-PAC OT "6 Clicks" Daily Activity     Outcome Measure   Help from another person eating meals?: None Help from another person taking care of personal grooming?: A Little Help from another person toileting, which includes using toliet, bedpan, or urinal?: Total Help from another person bathing (including washing, rinsing, drying)?: A Lot Help from another person to put on and taking off regular upper body clothing?: A Little Help from another person to put on and taking off regular lower body clothing?: Total 6 Click Score: 14    End of Session Equipment Utilized During Treatment: Gait belt;Rolling walker  OT Visit Diagnosis: Unsteadiness on feet (R26.81);Muscle weakness (generalized) (M62.81)   Activity Tolerance Patient tolerated treatment well   Patient Left in bed;with call bell/phone within reach;with bed alarm set;with family/visitor present   Nurse Communication Mobility status;Other (comment)        Time: 6213-0865 OT Time Calculation (min): 39 min  Charges: OT General Charges $OT Visit: 1 Visit OT Treatments $Self Care/Home Management : 8-22  mins $Therapeutic Activity: 8-22 mins $Therapeutic Exercise: 8-22 mins  Gerrianne Scale, MS, OTR/L ascom [phone] 12/11/20, 9:50 AM

## 2020-12-11 NOTE — Progress Notes (Signed)
PROGRESS NOTE    Ashley [name]  RKY:[id number] DOB: 02-19-1931 DOA: 12/05/2020 PCP: Albina Billet, MD     Brief Narrative:  87 y.o.WF PMHx Depression, HTN, HLD, CAD, COPD,Hx  stroke   Presents to the ED for chief concern offalling with head trauma and loss of consciousness.  She reports that a doorbell ring as she was getting up from her chair to answer the door and the next thing she remembers she woke up and she was laying on her back on the floor. She denies presyncopal feelings of chest pain, shortness of breath, dizziness.  She endorses that in the last couple of months she has been feeling very weak. She notes that Zoloft was started a few months ago and since then she has felt very lethargic and weak.  CT head concerning for subarachnoid hemorrhage, repeat CT with stable changes.  Neurosurgery recommending conservative management. Also found to have hyponatremia.  Repeated another CT when she was becoming more agitated and it shows resolution of prior findings and no new abnormality.  Persistent hyponatremia, nephrology was consulted and she received 1 dose of tolvaptan yesterday, another repeat today.   Subjective: Afebrile overnight    Assessment & Plan: Covid vaccination;   Active Problems:   Coronary artery disease   Stroke (cerebrum) (HCC)   Subarachnoid hematoma (HCC)   SAH (subarachnoid hemorrhage) (HCC)   Fall   Acute hyponatremia   Pressure injury of skin   Chronic diastolic CHF (congestive heart failure) (HCC)   Sundowning   Generalized weakness   Subarachnoid hemorrhage.  -Neurosurgery was consulted and they are suspecting secondary to trauma and not due to aneurysmal rupture. Repeat CT head was negative for any progression. No focal deficit. Per neurosurgery there is no need for any further follow-up. Repeat CT head today when she become more agitated was negative for any new abnormality, shows rather some resolution of prior  bleeding. -Keep holding aspirin for another 2 days, can resume on 12/12/2020.  Hyponatremia.  -Patient started Zoloft ~2 months ago per son and experiencing generalized weakness since then. -Zoloft discontinued -Also endorsed low-sodium diet. -Hyponatremia labs with low serum osmolality at 268, normal urine osmolality and urinary sodium of 27.  Most likely secondary to Zoloft. -Uric acid within normal limit -Spironolactone was also discontinued after discussing with cardiology with no change in her sodium. -Nephrology was consulted. -Sodium started improving with Tolvaptan, Lab Results  Component Value Date   NA 129 (L) 12/11/2020   NA 126 (L) 12/10/2020   NA 124 (L) 12/10/2020   NA 120 (L) 12/09/2020   NA 120 (L) 12/09/2020  -Slowly improving  Generalized weakness.  -Multifactorial stroke in September 2021, Hyponatremia. -PT/OT evaluation-recommending SNF placement, family is in agreeable. -TOC consult for placement  Chronic Diastolic CHF  -Strict in and out -Daily weight -Amlodipine 10 mg daily -2/16 isosorbide dinitrate 30 mg BID (hold) secondary to hypotension -2/16 DC lisinopril -Toprol 75 mg daily -Echocardiogram done in September 2021 with grade 1 diastolic dysfunction. -Continue home dose of lisinopril, metoprolol.  Essential hypertension.  -See CHF   HX of anxiety/depression.  -No acute concern -Continue home dose of alprazolam as needed.  Will attempt to titrate patient off of this medication, and if needed place on a longer acting agent given that SSRI has caused hyponatremia  Sundowning.   -Patient is exhibiting signs of sundowning which is very common in this age group.  Some improvement after adding Seroquel. -Continue low-dose Seroquel at night  Hyperkalemia -  Lokelma 10 g x 1 -2/16 repeat K/Mg @1600  -2/16 K continues to be elevated.  Kayexalate 45 g BID    DVT prophylaxis: SCD Code Status: DNR Family Communication: 2/16 son at bedside for  discussion of plan of care answered all questions Status is: Inpatient    Dispo: The patient is from: Home              Anticipated d/c is to: SNF              Anticipated d/c date is: Tomorrow?              Patient currently unstable      Consultants:  Neurosurgery  Procedures/Significant Events:    I have personally reviewed and interpreted all radiology studies and my findings are as above.  VENTILATOR SETTINGS:    Cultures   Antimicrobials:    Devices    LINES / TUBES:      Continuous Infusions:   Objective: Vitals:   12/11/20 1127 12/11/20 1326 12/11/20 1554 12/11/20 1935  BP: (!) 86/55 (!) 108/95 97/64 133/60  Pulse: 85  75 78  Resp: 15  18 18   Temp: 97.7 F (36.5 C)  97.7 F (36.5 C) 97.9 F (36.6 C)  TempSrc: Oral  Oral Oral  SpO2: 95%  97% 98%  Weight:      Height:        Intake/Output Summary (Last 24 hours) at 12/11/2020 2118 Last data filed at 12/11/2020 2057 Gross per 24 hour  Intake 720 ml  Output 1800 ml  Net -1080 ml   Filed Weights   12/08/20 0409 12/09/20 0507 12/09/20 1900  Weight: 75.4 kg 78.6 kg 76.2 kg    Examination:  General: A/O x2 (does not know where, why) pleasant follows all commands, No acute respiratory distress Eyes: negative scleral hemorrhage, negative anisocoria, negative icterus ENT: Negative Runny nose, negative gingival bleeding, Neck:  Negative scars, masses, torticollis, lymphadenopathy, JVD Lungs: Clear to auscultation bilaterally without wheezes or crackles Cardiovascular: Regular rate and rhythm without murmur gallop or rub normal S1 and S2 Abdomen: negative abdominal pain, nondistended, positive soft, bowel sounds, no rebound, no ascites, no appreciable mass Extremities: No significant cyanosis, clubbing, or edema bilateral lower extremities Skin: Negative rashes, lesions, ulcers Psychiatric:  Negative depression, negative anxiety, negative fatigue, negative mania  Central nervous system:   Cranial nerves II through XII intact, tongue/uvula midline, all extremities muscle strength 5/5, sensation intact throughout,negative dysarthria, negative expressive aphasia, negative receptive aphasia.  .     Data Reviewed: Care during the described time interval was provided by me .  I have reviewed this patient's available data, including medical history, events of note, physical examination, and all test results as part of my evaluation.  CBC: Recent Labs  Lab 12/05/20 1205 12/06/20 0338 12/07/20 0511 12/10/20 0310  WBC 13.1* 8.3 8.0 6.2  NEUTROABS 11.4*  --   --  3.8  HGB 15.0 15.0 15.7* 13.9  HCT 45.6 45.0 45.9 41.4  MCV 86.7 86.4 85.5 86.4  PLT 231 226 238 867   Basic Metabolic Panel: Recent Labs  Lab 12/08/20 0714 12/08/20 1636 12/09/20 0854 12/09/20 1920 12/10/20 0310 12/10/20 0830 12/11/20 0437 12/11/20 1536  NA 123* 120* 120* 120* 124* 126* 129*  --   K 4.1 4.7 4.6  --   --  4.9 5.3* 5.7*  CL 92* 90* 90*  --   --  94* 96*  --   CO2 20* 23 21*  --   --  25 23  --   GLUCOSE 123* 108* 103*  --   --  108* 112*  --   BUN 15 22 24*  --   --  31* 44*  --   CREATININE 0.50 0.45 0.63  --   --  0.99 1.29*  --   CALCIUM 9.3 8.8* 8.9  --   --  9.4 9.5  --   MG  --   --   --   --   --   --   --  2.1   GFR: Estimated Creatinine Clearance: 29.3 mL/min (A) (by C-G formula based on SCr of 1.29 mg/dL (H)). Liver Function Tests: Recent Labs  Lab 12/05/20 1205 12/09/20 0854  AST 22 19  ALT 15 17  ALKPHOS 59 54  BILITOT 1.1 1.1  PROT 7.1 6.0*  ALBUMIN 3.9 3.3*   No results for input(s): LIPASE, AMYLASE in the last 168 hours. No results for input(s): AMMONIA in the last 168 hours. Coagulation Profile: Recent Labs  Lab 12/05/20 1205  INR 1.0   Cardiac Enzymes: No results for input(s): CKTOTAL, CKMB, CKMBINDEX, TROPONINI in the last 168 hours. BNP (last 3 results) No results for input(s): PROBNP in the last 8760 hours. HbA1C: No results for input(s): HGBA1C in  the last 72 hours. CBG: No results for input(s): GLUCAP in the last 168 hours. Lipid Profile: No results for input(s): CHOL, HDL, LDLCALC, TRIG, CHOLHDL, LDLDIRECT in the last 72 hours. Thyroid Function Tests: No results for input(s): TSH, T4TOTAL, FREET4, T3FREE, THYROIDAB in the last 72 hours. Anemia Panel: No results for input(s): VITAMINB12, FOLATE, FERRITIN, TIBC, IRON, RETICCTPCT in the last 72 hours. Sepsis Labs: No results for input(s): PROCALCITON, LATICACIDVEN in the last 168 hours.  Recent Results (from the past 240 hour(s))  Resp Panel by RT-PCR (Flu A&B, Covid) Nasopharyngeal Swab     Status: None   Collection Time: 12/05/20  6:59 PM   Specimen: Nasopharyngeal Swab; Nasopharyngeal(NP) swabs in vial transport medium  Result Value Ref Range Status   SARS Coronavirus 2 by RT PCR NEGATIVE NEGATIVE Final    Comment: (NOTE) SARS-CoV-2 target nucleic acids are NOT DETECTED.  The SARS-CoV-2 RNA is generally detectable in upper respiratory specimens during the acute phase of infection. The lowest concentration of SARS-CoV-2 viral copies this assay can detect is 138 copies/mL. A negative result does not preclude SARS-Cov-2 infection and should not be used as the sole basis for treatment or other patient management decisions. A negative result may occur with  improper specimen collection/handling, submission of specimen other than nasopharyngeal swab, presence of viral mutation(s) within the areas targeted by this assay, and inadequate number of viral copies(<138 copies/mL). A negative result must be combined with clinical observations, patient history, and epidemiological information. The expected result is Negative.  Fact Sheet for Patients:  EntrepreneurPulse.com.au  Fact Sheet for Healthcare Providers:  IncredibleEmployment.be  This test is no t yet approved or cleared by the [place] FDA and  has been authorized for detection  and/or diagnosis of SARS-CoV-2 by FDA under an Emergency Use Authorization (EUA). This EUA will remain  in effect (meaning this test can be used) for the duration of the COVID-19 declaration under Section 564(b)(1) of the Act, 21 U.S.C.section 360bbb-3(b)(1), unless the authorization is terminated  or revoked sooner.       Influenza A by PCR NEGATIVE NEGATIVE Final   Influenza B by PCR NEGATIVE NEGATIVE Final    Comment: (NOTE) The Xpert Xpress SARS-CoV-2/FLU/RSV plus  assay is intended as an aid in the diagnosis of influenza from Nasopharyngeal swab specimens and should not be used as a sole basis for treatment. Nasal washings and aspirates are unacceptable for Xpert Xpress SARS-CoV-2/FLU/RSV testing.  Fact Sheet for Patients: EntrepreneurPulse.com.au  Fact Sheet for Healthcare Providers: IncredibleEmployment.be  This test is not yet approved or cleared by the [place] FDA and has been authorized for detection and/or diagnosis of SARS-CoV-2 by FDA under an Emergency Use Authorization (EUA). This EUA will remain in effect (meaning this test can be used) for the duration of the COVID-19 declaration under Section 564(b)(1) of the Act, 21 U.S.C. section 360bbb-3(b)(1), unless the authorization is terminated or revoked.  Performed at [facility], [address], [place], [place] [address]          Radiology Studies: No results found.      Scheduled Meds: . amLODipine  10 mg Oral Daily  . Chlorhexidine Gluconate Cloth  6 each Topical Daily  . feeding supplement  237 mL Oral BID BM  . ferrous sulfate  325 mg Oral Q M,W,F  . Ipratropium-Albuterol  1 puff Inhalation QID  . metoprolol succinate  75 mg Oral Daily  . multivitamin with minerals  1 tablet Oral Daily  . pantoprazole  40 mg Oral BID  . polyethylene glycol  17 g Oral Daily  . QUEtiapine  25 mg Oral QHS  . simvastatin  40 mg Oral QHS  . sodium chloride flush   10-40 mL Intracatheter Q12H  . sodium polystyrene  45 g Oral BID  . vitamin B-12  1,000 mcg Oral Daily   Continuous Infusions:   LOS: 5 days    Time spent:40 min    WOODS, Geraldo Docker, MD [facility]   If 7PM-7AM, please contact night-coverage 12/11/2020, 9:18 PM

## 2020-12-11 NOTE — Progress Notes (Signed)
[place], [place] 12/11/20  Subjective:   Ashley [name] is a 87 y.o. female with PMHX of HTN, hyperlipidemia, CAD, COPD, depression and stroke. She presented to the ED after falling, hitting her head and LOC. She reports getting up to answer the door and only remembers waking up on the floor. She reports starting Zoloft about 2 months ago and feeling week and lethargic since that time.   She was admitted with a subarachnoid hematoma.   Patient was seen while sitting in chair She says she feels better today She is able to eat without nausea Denies shortness of breath and chest pain UOP-1.550L  Objective:  Vital signs in last 24 hours:  Temp:  [97.5 F (36.4 C)-98.3 F (36.8 C)] 97.7 F (36.5 C) (02/16 1127) Pulse Rate:  [73-94] 85 (02/16 1127) Resp:  [15-24] 15 (02/16 1127) BP: (86-126)/(55-68) 86/55 (02/16 1127) SpO2:  [95 %-97 %] 95 % (02/16 1127)  Weight change:  Filed Weights   12/08/20 0409 12/09/20 0507 12/09/20 1900  Weight: 75.4 kg 78.6 kg 76.2 kg    Intake/Output:    Intake/Output Summary (Last 24 hours) at 12/11/2020 1135 Last data filed at 12/11/2020 1014 Gross per 24 hour  Intake 360 ml  Output 800 ml  Net -440 ml   Physical Exam: General: NAD, sitting in chair  Head: Normocephalic, atraumatic. dry oral mucosal membranes  Eyes: Anicteric  Neck: Supple, trachea midline  Lungs:  Clear to auscultation  Heart: Regular rate and rhythm  Abdomen:  Soft, nontender,   Extremities:  no peripheral edema.  Neurologic: Alert and oriented  Skin: No lesions    Basic Metabolic Panel:  Recent Labs  Lab 12/08/20 0714 12/08/20 1636 12/09/20 0854 12/09/20 1920 12/10/20 0310 12/10/20 0830 12/11/20 0437  NA 123* 120* 120* 120* 124* 126* 129*  K 4.1 4.7 4.6  --   --  4.9 5.3*  CL 92* 90* 90*  --   --  94* 96*  CO2 20* 23 21*  --   --  25 23  GLUCOSE 123* 108* 103*  --   --  108* 112*  BUN 15 22 24*  --   --  31* 44*  CREATININE  0.50 0.45 0.63  --   --  0.99 1.29*  CALCIUM 9.3 8.8* 8.9  --   --  9.4 9.5     CBC: Recent Labs  Lab 12/05/20 1205 12/06/20 0338 12/07/20 0511 12/10/20 0310  WBC 13.1* 8.3 8.0 6.2  NEUTROABS 11.4*  --   --  3.8  HGB 15.0 15.0 15.7* 13.9  HCT 45.6 45.0 45.9 41.4  MCV 86.7 86.4 85.5 86.4  PLT 231 226 238 200     No results found for: HEPBSAG, HEPBSAB, HEPBIGM    Microbiology:  Recent Results (from the past 240 hour(s))  Resp Panel by RT-PCR (Flu A&B, Covid) Nasopharyngeal Swab     Status: None   Collection Time: 12/05/20  6:59 PM   Specimen: Nasopharyngeal Swab; Nasopharyngeal(NP) swabs in vial transport medium  Result Value Ref Range Status   SARS Coronavirus 2 by RT PCR NEGATIVE NEGATIVE Final    Comment: (NOTE) SARS-CoV-2 target nucleic acids are NOT DETECTED.  The SARS-CoV-2 RNA is generally detectable in upper respiratory specimens during the acute phase of infection. The lowest concentration of SARS-CoV-2 viral copies this assay can detect is 138 copies/mL. A negative result does not preclude SARS-Cov-2 infection and should not be used as the sole basis for treatment  or other patient management decisions. A negative result may occur with  improper specimen collection/handling, submission of specimen other than nasopharyngeal swab, presence of viral mutation(s) within the areas targeted by this assay, and inadequate number of viral copies(<138 copies/mL). A negative result must be combined with clinical observations, patient history, and epidemiological information. The expected result is Negative.  Fact Sheet for Patients:  EntrepreneurPulse.com.au  Fact Sheet for Healthcare Providers:  IncredibleEmployment.be  This test is no t yet approved or cleared by the [place] FDA and  has been authorized for detection and/or diagnosis of SARS-CoV-2 by FDA under an Emergency Use Authorization (EUA). This EUA will remain  in  effect (meaning this test can be used) for the duration of the COVID-19 declaration under Section 564(b)(1) of the Act, 21 U.S.C.section 360bbb-3(b)(1), unless the authorization is terminated  or revoked sooner.       Influenza A by PCR NEGATIVE NEGATIVE Final   Influenza B by PCR NEGATIVE NEGATIVE Final    Comment: (NOTE) The Xpert Xpress SARS-CoV-2/FLU/RSV plus assay is intended as an aid in the diagnosis of influenza from Nasopharyngeal swab specimens and should not be used as a sole basis for treatment. Nasal washings and aspirates are unacceptable for Xpert Xpress SARS-CoV-2/FLU/RSV testing.  Fact Sheet for Patients: EntrepreneurPulse.com.au  Fact Sheet for Healthcare Providers: IncredibleEmployment.be  This test is not yet approved or cleared by the [place] FDA and has been authorized for detection and/or diagnosis of SARS-CoV-2 by FDA under an Emergency Use Authorization (EUA). This EUA will remain in effect (meaning this test can be used) for the duration of the COVID-19 declaration under Section 564(b)(1) of the Act, 21 U.S.C. section 360bbb-3(b)(1), unless the authorization is terminated or revoked.  Performed at [facility], [address] [place], [place] [address]     Coagulation Studies: No results for input(s): LABPROT, INR in the last 72 hours.  Urinalysis: No results for input(s): COLORURINE, LABSPEC, PHURINE, GLUCOSEU, HGBUR, BILIRUBINUR, KETONESUR, PROTEINUR, UROBILINOGEN, NITRITE, LEUKOCYTESUR in the last 72 hours.  Invalid input(s): APPERANCEUR    Imaging: No results found.   Medications:    . amLODipine  10 mg Oral Daily  . Chlorhexidine Gluconate Cloth  6 each Topical Daily  . feeding supplement  237 mL Oral BID BM  . ferrous sulfate  325 mg Oral Q M,W,F  . Ipratropium-Albuterol  1 puff Inhalation QID  . isosorbide dinitrate  30 mg Oral BID  . lisinopril  10 mg Oral Daily  .  metoprolol succinate  75 mg Oral Daily  . multivitamin with minerals  1 tablet Oral Daily  . pantoprazole  40 mg Oral BID  . polyethylene glycol  17 g Oral Daily  . QUEtiapine  25 mg Oral QHS  . simvastatin  40 mg Oral QHS  . sodium chloride flush  10-40 mL Intracatheter Q12H  . sodium zirconium cyclosilicate  10 g Oral Once  . vitamin B-12  1,000 mcg Oral Daily   acetaminophen, ALPRAZolam, metoprolol tartrate, ondansetron **OR** ondansetron (ZOFRAN) IV, polyvinyl alcohol, sodium chloride flush  Assessment/ Plan:  87 y.o. female with  PMHX of HTN, hyperlipidemia, CAD, COPD, depression and stroke was admitted on 12/05/2020  Active Problems:   Subarachnoid hematoma (HCC)   SAH (subarachnoid hemorrhage) ([place])   Fall   Acute hyponatremia   Pressure injury of skin  Acute hyponatremia [E87.1] SAH (subarachnoid hemorrhage) ([place]) [I60.9] Subarachnoid hematoma ([place]) [S06.6X9A] Fall, initial encounter [W19.XXXA] Subarachnoid hemorrhage ([place]) [I60.9]  #. Hyponatremia -improved  today to 129 -Tolvaptan 15mg  given once during admission  #. Hypertension -improved BP control -Currently prescribed Amlodipine, lisinopril, and metoprolol   #. Diastolic CHF Not acute exacerbation  Primary team will manage   #. Diabetes type 2 with CKD Hgb A1c MFr Bld (%)  Date Value  07/13/2020 5.2  Acceptable glucose levels    LOS: 5 Roque Cash 2/16/202211:35 AM  Temperance Orcutt, [place]

## 2020-12-12 DIAGNOSIS — I5032 Chronic diastolic (congestive) heart failure: Secondary | ICD-10-CM | POA: Diagnosis not present

## 2020-12-12 DIAGNOSIS — W19XXXA Unspecified fall, initial encounter: Secondary | ICD-10-CM | POA: Diagnosis not present

## 2020-12-12 DIAGNOSIS — I2510 Atherosclerotic heart disease of native coronary artery without angina pectoris: Secondary | ICD-10-CM | POA: Diagnosis not present

## 2020-12-12 DIAGNOSIS — E871 Hypo-osmolality and hyponatremia: Secondary | ICD-10-CM | POA: Diagnosis not present

## 2020-12-12 LAB — SARS CORONAVIRUS 2 (TAT 6-24 HRS): SARS Coronavirus 2: NEGATIVE

## 2020-12-12 LAB — CBC
HCT: 48.2 % — ABNORMAL HIGH (ref 36.0–46.0)
Hemoglobin: 15.5 g/dL — ABNORMAL HIGH (ref 12.0–15.0)
MCH: 29.2 pg (ref 26.0–34.0)
MCHC: 32.2 g/dL (ref 30.0–36.0)
MCV: 90.8 fL (ref 80.0–100.0)
Platelets: 161 10*3/uL (ref 150–400)
RBC: 5.31 MIL/uL — ABNORMAL HIGH (ref 3.87–5.11)
RDW: 13.8 % (ref 11.5–15.5)
WBC: 7.7 10*3/uL (ref 4.0–10.5)
nRBC: 0 % (ref 0.0–0.2)

## 2020-12-12 LAB — COMPREHENSIVE METABOLIC PANEL
ALT: 17 U/L (ref 0–44)
AST: 23 U/L (ref 15–41)
Albumin: 3.1 g/dL — ABNORMAL LOW (ref 3.5–5.0)
Alkaline Phosphatase: 60 U/L (ref 38–126)
Anion gap: 9 (ref 5–15)
BUN: 37 mg/dL — ABNORMAL HIGH (ref 8–23)
CO2: 26 mmol/L (ref 22–32)
Calcium: 9.1 mg/dL (ref 8.9–10.3)
Chloride: 97 mmol/L — ABNORMAL LOW (ref 98–111)
Creatinine, Ser: 0.95 mg/dL (ref 0.44–1.00)
GFR, Estimated: 58 mL/min — ABNORMAL LOW (ref >60)
Glucose, Bld: 93 mg/dL (ref 70–99)
Potassium: 4.2 mmol/L (ref 3.5–5.1)
Sodium: 132 mmol/L — ABNORMAL LOW (ref 135–145)
Total Bilirubin: 1 mg/dL (ref 0.3–1.2)
Total Protein: 6 g/dL — ABNORMAL LOW (ref 6.5–8.1)

## 2020-12-12 LAB — POTASSIUM: Potassium: 4.9 mmol/L (ref 3.5–5.1)

## 2020-12-12 LAB — PHOSPHORUS: Phosphorus: 4.4 mg/dL (ref 2.5–4.6)

## 2020-12-12 LAB — MAGNESIUM
Magnesium: 2 mg/dL (ref 1.7–2.4)
Magnesium: 2 mg/dL (ref 1.7–2.4)

## 2020-12-12 MED ORDER — ASPIRIN EC 81 MG PO TBEC
81.0000 mg | DELAYED_RELEASE_TABLET | Freq: Every day | ORAL | Status: DC
  Administered 2020-12-12 – 2020-12-13 (×2): 81 mg via ORAL
  Filled 2020-12-12 (×2): qty 1

## 2020-12-12 MED ORDER — SODIUM ZIRCONIUM CYCLOSILICATE 10 G PO PACK
10.0000 g | PACK | Freq: Two times a day (BID) | ORAL | Status: AC
  Administered 2020-12-12 (×2): 10 g via ORAL
  Filled 2020-12-12 (×2): qty 1

## 2020-12-12 NOTE — Progress Notes (Signed)
Pt had a large loose bowel movement after kayexalate dose given at 2200.

## 2020-12-12 NOTE — Progress Notes (Signed)
PROGRESS NOTE    Ashley [name]  ZOX:[id number] DOB: 23-Mar-1931 DOA: 12/05/2020 PCP: Jaclyn Shaggy, MD     Brief Narrative:  87 y.o.WF PMHx Depression, HTN, HLD, CAD, COPD,Hx  stroke   Presents to the ED for chief concern offalling with head trauma and loss of consciousness.  She reports that a doorbell ring as she was getting up from her chair to answer the door and the next thing she remembers she woke up and she was laying on her back on the floor. She denies presyncopal feelings of chest pain, shortness of breath, dizziness.  She endorses that in the last couple of months she has been feeling very weak. She notes that Zoloft was started a few months ago and since then she has felt very lethargic and weak.  CT head concerning for subarachnoid hemorrhage, repeat CT with stable changes.  Neurosurgery recommending conservative management. Also found to have hyponatremia.  Repeated another CT when she was becoming more agitated and it shows resolution of prior findings and no new abnormality.  Persistent hyponatremia, nephrology was consulted and she received 1 dose of tolvaptan yesterday, another repeat today.   Subjective: 2/17 afebrile overnight alert, oriented some dementia but pleasant and cooperative    Assessment & Plan: Covid vaccination;   Active Problems:   Coronary artery disease   Stroke (cerebrum) (HCC)   Subarachnoid hematoma (HCC)   SAH (subarachnoid hemorrhage) (HCC)   Fall   Acute hyponatremia   Pressure injury of skin   Chronic diastolic CHF (congestive heart failure) (HCC)   Sundowning   Generalized weakness   Subarachnoid hemorrhage.  -Neurosurgery was consulted and they are suspecting secondary to trauma and not due to aneurysmal rupture. Repeat CT head was negative for any progression. No focal deficit. Per neurosurgery there is no need for any further follow-up. Repeat CT head today when she become more agitated was negative for any  new abnormality, shows rather some resolution of prior bleeding. -Keep holding aspirin for another 2 days, can resume on 12/12/2020.  Hyponatremia.  -Patient started Zoloft ~2 months ago per son and experiencing generalized weakness since then. -Zoloft discontinued -Also endorsed low-sodium diet. -Hyponatremia labs with low serum osmolality at 268, normal urine osmolality and urinary sodium of 27.  Most likely secondary to Zoloft. -Uric acid within normal limit -Spironolactone was also discontinued after discussing with cardiology with no change in her sodium. -Nephrology was consulted. -Sodium started improving with Tolvaptan, Lab Results  Component Value Date   NA 129 (L) 12/11/2020   NA 126 (L) 12/10/2020   NA 124 (L) 12/10/2020   NA 120 (L) 12/09/2020   NA 120 (L) 12/09/2020  -Slowly improving  Generalized weakness.  -Multifactorial stroke in September 2021, Hyponatremia. -PT/OT evaluation-recommending SNF placement, family is in agreeable. -TOC consult for placement  Chronic Diastolic CHF  -Strict in and out -5.8 L -Daily weight Filed Weights   12/08/20 0409 12/09/20 0507 12/09/20 1900  Weight: 75.4 kg 78.6 kg 76.2 kg  -Amlodipine 10 mg daily -2/16 isosorbide dinitrate 30 mg BID (hold) secondary to hypotension.  If patient's BP begins to spike at SNF would restart this medication first -2/16 DC lisinopril -Toprol 75 mg daily -Echocardiogram done in September 2021 with grade 1 diastolic dysfunction.  Essential hypertension.  -See CHF   HX of anxiety/depression.  -No acute concern -Continue home dose of alprazolam as needed.  Will attempt to titrate patient off of this medication, and if needed place on a longer acting  agent given that SSRI has caused hyponatremia  Sundowning.   -Patient is exhibiting signs of sundowning which is very common in this age group.  Some improvement after adding Seroquel. -Continue low-dose Seroquel at night  Hyperkalemia -Lokelma  10 g x 1 -2/16 repeat K/Mg @1600  -2/16 K continues to be elevated.  Kayexalate 45 g BID -2/17 Lokelma 10 g BID -2/17 repeat K//Mg @1600 ; within goal   DVT prophylaxis: SCD Code Status: DNR Family Communication: 2/17 son at bedside for discussion of plan of care answered all questions Status is: Inpatient    Dispo: The patient is from: Home              Anticipated d/c is to: SNF              Anticipated d/c date is: 2/18              Patient currently unstable      Consultants:  Neurosurgery  Procedures/Significant Events:    I have personally reviewed and interpreted all radiology studies and my findings are as above.  VENTILATOR SETTINGS: Room air 2/17 SPO2 95%   Cultures   Antimicrobials:    Devices    LINES / TUBES:      Continuous Infusions:   Objective: Vitals:   12/11/20 1935 12/12/20 0012 12/12/20 0347 12/12/20 0740  BP: 133/60 111/68 (!) 102/52 (!) 143/60  Pulse: 78 66 60 70  Resp: 18 20 18 14   Temp: 97.9 F (36.6 C) (!) 97.5 F (36.4 C) 98.2 F (36.8 C) 97.9 F (36.6 C)  TempSrc: Oral Oral  Oral  SpO2: 98% 99% 95% 99%  Weight:      Height:        Intake/Output Summary (Last 24 hours) at 12/12/2020 0747 Last data filed at 12/12/2020 8756 Gross per 24 hour  Intake 720 ml  Output 1701 ml  Net -981 ml   Filed Weights   12/08/20 0409 12/09/20 0507 12/09/20 1900  Weight: 75.4 kg 78.6 kg 76.2 kg    Examination:  General: A/O x2 (does not know where, why) pleasant follows all commands, No acute respiratory distress Eyes: negative scleral hemorrhage, negative anisocoria, negative icterus ENT: Negative Runny nose, negative gingival bleeding, Neck:  Negative scars, masses, torticollis, lymphadenopathy, JVD Lungs: Clear to auscultation bilaterally without wheezes or crackles Cardiovascular: Regular rate and rhythm without murmur gallop or rub normal S1 and S2 Abdomen: negative abdominal pain, nondistended, positive soft, bowel  sounds, no rebound, no ascites, no appreciable mass Extremities: No significant cyanosis, clubbing, or edema bilateral lower extremities Skin: Negative rashes, lesions, ulcers Psychiatric:  Negative depression, negative anxiety, negative fatigue, negative mania  Central nervous system:  Cranial nerves II through XII intact, tongue/uvula midline, all extremities muscle strength 5/5, sensation intact throughout,negative dysarthria, negative expressive aphasia, negative receptive aphasia.  .     Data Reviewed: Care during the described time interval was provided by me .  I have reviewed this patient's available data, including medical history, events of note, physical examination, and all test results as part of my evaluation.  CBC: Recent Labs  Lab 12/05/20 1205 12/06/20 0338 12/07/20 0511 12/10/20 0310  WBC 13.1* 8.3 8.0 6.2  NEUTROABS 11.4*  --   --  3.8  HGB 15.0 15.0 15.7* 13.9  HCT 45.6 45.0 45.9 41.4  MCV 86.7 86.4 85.5 86.4  PLT 231 226 238 200   Basic Metabolic Panel: Recent Labs  Lab 12/08/20 0714 12/08/20 1636 12/09/20 0854 12/09/20 1920 12/10/20  0310 12/10/20 0830 12/11/20 0437 12/11/20 1536  NA 123* 120* 120* 120* 124* 126* 129*  --   K 4.1 4.7 4.6  --   --  4.9 5.3* 5.7*  CL 92* 90* 90*  --   --  94* 96*  --   CO2 20* 23 21*  --   --  25 23  --   GLUCOSE 123* 108* 103*  --   --  108* 112*  --   BUN 15 22 24*  --   --  31* 44*  --   CREATININE 0.50 0.45 0.63  --   --  0.99 1.29*  --   CALCIUM 9.3 8.8* 8.9  --   --  9.4 9.5  --   MG  --   --   --   --   --   --   --  2.1   GFR: Estimated Creatinine Clearance: 29.3 mL/min (A) (by C-G formula based on SCr of 1.29 mg/dL (H)). Liver Function Tests: Recent Labs  Lab 12/05/20 1205 12/09/20 0854  AST 22 19  ALT 15 17  ALKPHOS 59 54  BILITOT 1.1 1.1  PROT 7.1 6.0*  ALBUMIN 3.9 3.3*   No results for input(s): LIPASE, AMYLASE in the last 168 hours. No results for input(s): AMMONIA in the last 168  hours. Coagulation Profile: Recent Labs  Lab 12/05/20 1205  INR 1.0   Cardiac Enzymes: No results for input(s): CKTOTAL, CKMB, CKMBINDEX, TROPONINI in the last 168 hours. BNP (last 3 results) No results for input(s): PROBNP in the last 8760 hours. HbA1C: No results for input(s): HGBA1C in the last 72 hours. CBG: No results for input(s): GLUCAP in the last 168 hours. Lipid Profile: No results for input(s): CHOL, HDL, LDLCALC, TRIG, CHOLHDL, LDLDIRECT in the last 72 hours. Thyroid Function Tests: No results for input(s): TSH, T4TOTAL, FREET4, T3FREE, THYROIDAB in the last 72 hours. Anemia Panel: No results for input(s): VITAMINB12, FOLATE, FERRITIN, TIBC, IRON, RETICCTPCT in the last 72 hours. Sepsis Labs: No results for input(s): PROCALCITON, LATICACIDVEN in the last 168 hours.  Recent Results (from the past 240 hour(s))  Resp Panel by RT-PCR (Flu A&B, Covid) Nasopharyngeal Swab     Status: None   Collection Time: 12/05/20  6:59 PM   Specimen: Nasopharyngeal Swab; Nasopharyngeal(NP) swabs in vial transport medium  Result Value Ref Range Status   SARS Coronavirus 2 by RT PCR NEGATIVE NEGATIVE Final    Comment: (NOTE) SARS-CoV-2 target nucleic acids are NOT DETECTED.  The SARS-CoV-2 RNA is generally detectable in upper respiratory specimens during the acute phase of infection. The lowest concentration of SARS-CoV-2 viral copies this assay can detect is 138 copies/mL. A negative result does not preclude SARS-Cov-2 infection and should not be used as the sole basis for treatment or other patient management decisions. A negative result may occur with  improper specimen collection/handling, submission of specimen other than nasopharyngeal swab, presence of viral mutation(s) within the areas targeted by this assay, and inadequate number of viral copies(<138 copies/mL). A negative result must be combined with clinical observations, patient history, and epidemiological information.  The expected result is Negative.  Fact Sheet for Patients:  BloggerCourse.com  Fact Sheet for Healthcare Providers:  SeriousBroker.it  This test is no t yet approved or cleared by the [place] FDA and  has been authorized for detection and/or diagnosis of SARS-CoV-2 by FDA under an Emergency Use Authorization (EUA). This EUA will remain  in effect (meaning this test  can be used) for the duration of the COVID-19 declaration under Section 564(b)(1) of the Act, 21 U.S.C.section 360bbb-3(b)(1), unless the authorization is terminated  or revoked sooner.       Influenza A by PCR NEGATIVE NEGATIVE Final   Influenza B by PCR NEGATIVE NEGATIVE Final    Comment: (NOTE) The Xpert Xpress SARS-CoV-2/FLU/RSV plus assay is intended as an aid in the diagnosis of influenza from Nasopharyngeal swab specimens and should not be used as a sole basis for treatment. Nasal washings and aspirates are unacceptable for Xpert Xpress SARS-CoV-2/FLU/RSV testing.  Fact Sheet for Patients: BloggerCourse.com  Fact Sheet for Healthcare Providers: SeriousBroker.it  This test is not yet approved or cleared by the [place] FDA and has been authorized for detection and/or diagnosis of SARS-CoV-2 by FDA under an Emergency Use Authorization (EUA). This EUA will remain in effect (meaning this test can be used) for the duration of the COVID-19 declaration under Section 564(b)(1) of the Act, 21 U.S.C. section 360bbb-3(b)(1), unless the authorization is terminated or revoked.  Performed at [facility], [address], [place], [place] [address]          Radiology Studies: No results found.      Scheduled Meds: . amLODipine  10 mg Oral Daily  . Chlorhexidine Gluconate Cloth  6 each Topical Daily  . feeding supplement  237 mL Oral BID BM  . ferrous sulfate  325 mg Oral Q M,W,F  .  Ipratropium-Albuterol  1 puff Inhalation QID  . metoprolol succinate  75 mg Oral Daily  . multivitamin with minerals  1 tablet Oral Daily  . pantoprazole  40 mg Oral BID  . polyethylene glycol  17 g Oral Daily  . QUEtiapine  25 mg Oral QHS  . simvastatin  40 mg Oral QHS  . sodium chloride flush  10-40 mL Intracatheter Q12H  . sodium polystyrene  45 g Oral BID  . vitamin B-12  1,000 mcg Oral Daily   Continuous Infusions:   LOS: 6 days    Time spent:40 min    Previn Jian, Roselind Messier, MD [facility]   If 7PM-7AM, please contact night-coverage 12/12/2020, 7:47 AM

## 2020-12-12 NOTE — Progress Notes (Signed)
OT Cancellation Note  Patient Details Name: Ashley [name] MRN: [id number] DOB: Sep 18, 1931   Cancelled Treatment:    Reason Eval/Treat Not Completed: Medical issues which prohibited therapy  Pt with elevated K+ at this time. Per RN, pt has received medication, but has yet to have BM. In addition, pt with low BP last night and this AM. Will f/u for OT treatment at later date/time as appropriate. Thank you.  Gerrianne Scale, [place], OTR/L ascom [phone] 12/12/20, 10:49 AM

## 2020-12-12 NOTE — Progress Notes (Signed)
[place], [place] 12/12/20  Subjective:   Ashley [name] is a 87 y.o. female with PMHX of HTN, hyperlipidemia, CAD, COPD, depression and stroke. She presented to the ED after falling, hitting her head and LOC. She reports getting up to answer the door and only remembers waking up on the floor. She reports starting Zoloft about 2 months ago and feeling week and lethargic since that time.   She was admitted with a subarachnoid hematoma.   Patient was seen while resting in bed She says she feels a lot better today She is able to eat without nausea Denies shortness of breath and chest pain  UOP-1.7L  Objective:  Vital signs in last 24 hours:  Temp:  [97.5 F (36.4 C)-98.2 F (36.8 C)] 98 F (36.7 C) (02/17 1100) Pulse Rate:  [60-78] 77 (02/17 1100) Resp:  [14-20] 16 (02/17 1100) BP: (97-143)/(52-68) 126/68 (02/17 1100) SpO2:  [95 %-99 %] 98 % (02/17 1100)  Weight change:  Filed Weights   12/08/20 0409 12/09/20 0507 12/09/20 1900  Weight: 75.4 kg 78.6 kg 76.2 kg    Intake/Output:    Intake/Output Summary (Last 24 hours) at 12/12/2020 1438 Last data filed at 12/12/2020 1333 Gross per 24 hour  Intake 600 ml  Output 1701 ml  Net -1101 ml   Physical Exam: General: NAD, laying in bed  Head: Normocephalic, atraumatic. dry oral mucosal membranes  Eyes: Anicteric  Neck: Supple, trachea midline  Lungs:  Clear to auscultation  Heart: Regular rate and rhythm  Abdomen:  Soft, nontender,   Extremities:  no peripheral edema.  Neurologic: Alert and oriented  Skin: No lesions    Basic Metabolic Panel:  Recent Labs  Lab 12/08/20 1636 12/09/20 0854 12/09/20 1920 12/10/20 0310 12/10/20 0830 12/11/20 0437 12/11/20 1536 12/12/20 1043  NA 120* 120* 120* 124* 126* 129*  --  132*  K 4.7 4.6  --   --  4.9 5.3* 5.7* 4.2  CL 90* 90*  --   --  94* 96*  --  97*  CO2 23 21*  --   --  25 23  --  26  GLUCOSE 108* 103*  --   --  108* 112*  --  93  BUN 22  24*  --   --  31* 44*  --  37*  CREATININE 0.45 0.63  --   --  0.99 1.29*  --  0.95  CALCIUM 8.8* 8.9  --   --  9.4 9.5  --  9.1  MG  --   --   --   --   --   --  2.1 2.0  PHOS  --   --   --   --   --   --   --  4.4     CBC: Recent Labs  Lab 12/06/20 0338 12/07/20 0511 12/10/20 0310 12/12/20 1121  WBC 8.3 8.0 6.2 7.7  NEUTROABS  --   --  3.8  --   HGB 15.0 15.7* 13.9 15.5*  HCT 45.0 45.9 41.4 48.2*  MCV 86.4 85.5 86.4 90.8  PLT 226 238 200 161     No results found for: HEPBSAG, HEPBSAB, HEPBIGM    Microbiology:  Recent Results (from the past 240 hour(s))  Resp Panel by RT-PCR (Flu A&B, Covid) Nasopharyngeal Swab     Status: None   Collection Time: 12/05/20  6:59 PM   Specimen: Nasopharyngeal Swab; Nasopharyngeal(NP) swabs in vial transport medium  Result Value Ref  Range Status   SARS Coronavirus 2 by RT PCR NEGATIVE NEGATIVE Final    Comment: (NOTE) SARS-CoV-2 target nucleic acids are NOT DETECTED.  The SARS-CoV-2 RNA is generally detectable in upper respiratory specimens during the acute phase of infection. The lowest concentration of SARS-CoV-2 viral copies this assay can detect is 138 copies/mL. A negative result does not preclude SARS-Cov-2 infection and should not be used as the sole basis for treatment or other patient management decisions. A negative result may occur with  improper specimen collection/handling, submission of specimen other than nasopharyngeal swab, presence of viral mutation(s) within the areas targeted by this assay, and inadequate number of viral copies(<138 copies/mL). A negative result must be combined with clinical observations, patient history, and epidemiological information. The expected result is Negative.  Fact Sheet for Patients:  EntrepreneurPulse.com.au  Fact Sheet for Healthcare Providers:  IncredibleEmployment.be  This test is no t yet approved or cleared by the [place] FDA and   has been authorized for detection and/or diagnosis of SARS-CoV-2 by FDA under an Emergency Use Authorization (EUA). This EUA will remain  in effect (meaning this test can be used) for the duration of the COVID-19 declaration under Section 564(b)(1) of the Act, 21 U.S.C.section 360bbb-3(b)(1), unless the authorization is terminated  or revoked sooner.       Influenza A by PCR NEGATIVE NEGATIVE Final   Influenza B by PCR NEGATIVE NEGATIVE Final    Comment: (NOTE) The Xpert Xpress SARS-CoV-2/FLU/RSV plus assay is intended as an aid in the diagnosis of influenza from Nasopharyngeal swab specimens and should not be used as a sole basis for treatment. Nasal washings and aspirates are unacceptable for Xpert Xpress SARS-CoV-2/FLU/RSV testing.  Fact Sheet for Patients: EntrepreneurPulse.com.au  Fact Sheet for Healthcare Providers: IncredibleEmployment.be  This test is not yet approved or cleared by the [place] FDA and has been authorized for detection and/or diagnosis of SARS-CoV-2 by FDA under an Emergency Use Authorization (EUA). This EUA will remain in effect (meaning this test can be used) for the duration of the COVID-19 declaration under Section 564(b)(1) of the Act, 21 U.S.C. section 360bbb-3(b)(1), unless the authorization is terminated or revoked.  Performed at [facility], [address] [place], [place] [address]     Coagulation Studies: No results for input(s): LABPROT, INR in the last 72 hours.  Urinalysis: No results for input(s): COLORURINE, LABSPEC, PHURINE, GLUCOSEU, HGBUR, BILIRUBINUR, KETONESUR, PROTEINUR, UROBILINOGEN, NITRITE, LEUKOCYTESUR in the last 72 hours.  Invalid input(s): APPERANCEUR    Imaging: No results found.   Medications:    . amLODipine  10 mg Oral Daily  . Chlorhexidine Gluconate Cloth  6 each Topical Daily  . feeding supplement  237 mL Oral BID BM  . ferrous sulfate  325 mg Oral  Q M,W,F  . Ipratropium-Albuterol  1 puff Inhalation QID  . metoprolol succinate  75 mg Oral Daily  . multivitamin with minerals  1 tablet Oral Daily  . pantoprazole  40 mg Oral BID  . polyethylene glycol  17 g Oral Daily  . QUEtiapine  25 mg Oral QHS  . simvastatin  40 mg Oral QHS  . sodium chloride flush  10-40 mL Intracatheter Q12H  . sodium zirconium cyclosilicate  10 g Oral BID  . vitamin B-12  1,000 mcg Oral Daily   acetaminophen, ALPRAZolam, metoprolol tartrate, ondansetron **OR** ondansetron (ZOFRAN) IV, polyvinyl alcohol, sodium chloride flush  Assessment/ Plan:  87 y.o. female with  PMHX of HTN, hyperlipidemia, CAD, COPD, depression  and stroke was admitted on 12/05/2020  Active Problems:   Coronary artery disease   Stroke (cerebrum) (HCC)   Subarachnoid hematoma (HCC)   SAH (subarachnoid hemorrhage) (HCC)   Fall   Acute hyponatremia   Pressure injury of skin   Chronic diastolic CHF (congestive heart failure) (HCC)   Sundowning   Generalized weakness  Acute hyponatremia [E87.1] SAH (subarachnoid hemorrhage) ([place]) [I60.9] Subarachnoid hematoma ([place]) [S06.6X9A] Fall, initial encounter [W19.XXXA] Subarachnoid hemorrhage ([place]) [I60.9]  #. Hyponatremia -improved today to 132 -Tolvaptan 15mg  given once during admission  #. Hypertension -improved BP control -Currently prescribed Amlodipine, lisinopril, and metoprolol   #. Diastolic CHF Not acute exacerbation  # Hyperkalemia -Current level-4.2 -Will start a Renal diet with 1200 ml fluid restriction   #. Diabetes type 2 with CKD Hgb A1c MFr Bld (%)  Date Value  07/13/2020 5.2  Acceptable glucose levels    LOS: 6  ,NP 2/17/20222:38 PM  [facility] [place], [place]

## 2020-12-12 NOTE — TOC Progression Note (Signed)
Transition of Care [facility]) - Progression Note    Patient Details  Name: Ashley [name] MRN: [id number] Date of Birth: [birth year]  Transition of Care [facility]) CM/SW Contact  Shelbie Hutching, RN Phone Number: 12/12/2020, 8:26 AM  Clinical Narrative:    RNCM spoke with patient's son at the bedside yesterday about bed offers.  Family would like to accept bed offer from [facility].     Expected Discharge Plan: [place] Barriers to Discharge: No Barriers Identified  Expected Discharge Plan and Services Expected Discharge Plan: [place] In-house Referral: Clinical Social Work   Post Acute Care Choice: [place] Living arrangements for the past 2 months: Single Family Home                                       Social Determinants of Health (SDOH) Interventions    Readmission Risk Interventions No flowsheet data found.

## 2020-12-13 DIAGNOSIS — E871 Hypo-osmolality and hyponatremia: Secondary | ICD-10-CM | POA: Diagnosis not present

## 2020-12-13 DIAGNOSIS — W19XXXA Unspecified fall, initial encounter: Secondary | ICD-10-CM | POA: Diagnosis not present

## 2020-12-13 DIAGNOSIS — I2510 Atherosclerotic heart disease of native coronary artery without angina pectoris: Secondary | ICD-10-CM | POA: Diagnosis not present

## 2020-12-13 DIAGNOSIS — I5032 Chronic diastolic (congestive) heart failure: Secondary | ICD-10-CM | POA: Diagnosis not present

## 2020-12-13 LAB — COMPREHENSIVE METABOLIC PANEL
ALT: 16 U/L (ref 0–44)
AST: 18 U/L (ref 15–41)
Albumin: 2.9 g/dL — ABNORMAL LOW (ref 3.5–5.0)
Alkaline Phosphatase: 58 U/L (ref 38–126)
Anion gap: 9 (ref 5–15)
BUN: 39 mg/dL — ABNORMAL HIGH (ref 8–23)
CO2: 27 mmol/L (ref 22–32)
Calcium: 9.1 mg/dL (ref 8.9–10.3)
Chloride: 94 mmol/L — ABNORMAL LOW (ref 98–111)
Creatinine, Ser: 0.84 mg/dL (ref 0.44–1.00)
GFR, Estimated: >60 mL/min (ref >60)
Glucose, Bld: 97 mg/dL (ref 70–99)
Potassium: 3.7 mmol/L (ref 3.5–5.1)
Sodium: 130 mmol/L — ABNORMAL LOW (ref 135–145)
Total Bilirubin: 0.7 mg/dL (ref 0.3–1.2)
Total Protein: 5.6 g/dL — ABNORMAL LOW (ref 6.5–8.1)

## 2020-12-13 LAB — PHOSPHORUS: Phosphorus: 4 mg/dL (ref 2.5–4.6)

## 2020-12-13 LAB — GLUCOSE, CAPILLARY: Glucose-Capillary: 109 mg/dL — ABNORMAL HIGH (ref 70–99)

## 2020-12-13 LAB — CBC
HCT: 42.1 % (ref 36.0–46.0)
Hemoglobin: 13.9 g/dL (ref 12.0–15.0)
MCH: 29.4 pg (ref 26.0–34.0)
MCHC: 33 g/dL (ref 30.0–36.0)
MCV: 89 fL (ref 80.0–100.0)
Platelets: 183 10*3/uL (ref 150–400)
RBC: 4.73 MIL/uL (ref 3.87–5.11)
RDW: 13.6 % (ref 11.5–15.5)
WBC: 7 10*3/uL (ref 4.0–10.5)
nRBC: 0 % (ref 0.0–0.2)

## 2020-12-13 LAB — MAGNESIUM: Magnesium: 2 mg/dL (ref 1.7–2.4)

## 2020-12-13 MED ORDER — ENSURE ENLIVE PO LIQD: 237.0000 mL | Freq: Two times a day (BID) | ORAL | 12 refills | Status: AC

## 2020-12-13 MED ORDER — ADULT MULTIVITAMIN W/MINERALS CH: 1.0000 | ORAL_TABLET | Freq: Every day | ORAL | 0 refills | Status: AC

## 2020-12-13 MED ORDER — ACETAMINOPHEN 500 MG PO TABS
1000.0000 mg | ORAL_TABLET | Freq: Four times a day (QID) | ORAL | 0 refills | Status: AC | PRN
Start: 2020-12-13 — End: ?

## 2020-12-13 MED ORDER — QUETIAPINE FUMARATE 25 MG PO TABS: 25.0000 mg | ORAL_TABLET | Freq: Every day | ORAL | 0 refills | Status: AC

## 2020-12-13 MED ORDER — ASPIRIN 81 MG PO TBEC: 81.0000 mg | DELAYED_RELEASE_TABLET | Freq: Every day | ORAL | 0 refills | Status: AC

## 2020-12-13 MED ORDER — POLYVINYL ALCOHOL 1.4 % OP SOLN: 1.0000 [drp] | OPHTHALMIC | 0 refills | Status: AC | PRN

## 2020-12-13 MED ORDER — ONDANSETRON HCL 4 MG PO TABS
4.0000 mg | ORAL_TABLET | Freq: Four times a day (QID) | ORAL | 0 refills | Status: AC | PRN
Start: 2020-12-13 — End: ?

## 2020-12-13 MED ORDER — POLYETHYLENE GLYCOL 3350 17 G PO PACK: 17.0000 g | PACK | Freq: Every day | ORAL | 0 refills | Status: AC

## 2020-12-13 NOTE — TOC Transition Note (Signed)
Transition of Care [facility]) - CM/SW Discharge Note   Patient Details  Name: Ashley [name] MRN: [id number] Date of Birth: Sep 12, 1931  Transition of Care [facility]) CM/SW Contact:  Shelbie Hutching, RN Phone Number: 12/13/2020, 10:23 AM   Clinical Narrative:    Patient is medically cleared for discharge to [facility] today.  Patient's son, Ashley [name] at the bedside and updated on discharge plans for today.  Patient will go to room 504, bedside RN to call report.  RNCM has arranged EMS transport.  Patient is 3rd on the list for pickup.    Final next level of care: Skilled Nursing Facility Barriers to Discharge: No Barriers Identified   Patient Goals and CMS Choice Patient states their goals for this hospitalization and ongoing recovery are:: paitent asleep during visit, met with patient's son at bedside CMS Medicare.gov Compare Post Acute Care list provided to:: Patient Represenative (must comment) Choice offered to / list presented to : Adult Children  Discharge Placement              Patient chooses bed at: [facility] Patient to be transferred to facility by: [facility] Name of family member notified: Ashley [name] Patient and family notified of of transfer: 12/13/20  Discharge Plan and Services In-house Referral: Clinical Social Work   Post Acute Care Choice: [place]          DME Arranged: N/A DME Agency: NA       HH Arranged: NA          Social Determinants of Health (SDOH) Interventions     Readmission Risk Interventions No flowsheet data found.

## 2020-12-13 NOTE — Discharge Summary (Signed)
Physician Discharge Summary  Ashley [name] XBM:[id number] DOB: 1930-11-15 DOA: 12/05/2020  PCP: Jaclyn Shaggy, MD  Admit date: 12/05/2020 Discharge date: 12/13/2020  Time spent: 30 minutes  Recommendations for Outpatient Follow-up:  Subarachnoid hemorrhage. -Neurosurgery was consulted and they are suspecting secondary to trauma and not due to aneurysmal rupture. Repeat CT head was negative for any progression. No focal deficit. Per neurosurgery there is no need for any further follow-up. Repeat CT head today when she become more agitated was negative for any new abnormality, shows rather some resolution of prior bleeding. -Keep holding aspirin for another2days, can resume on 12/12/2020.  Hyponatremia. -Patient started Zoloft ~2 months ago per son and experiencing generalized weakness since then. -Zoloft discontinued -Hyponatremia labs with low serum osmolality at 268, normal urine osmolality and urinary sodium of 27. Most likely secondary to Zoloft. -Uric acid within normal limit -Spironolactone was also discontinued after discussing with cardiology with no change in her sodium. -Nephrology was consulted. -Sodium started improving with Tolvaptan, Lab Results  Component Value Date   NA 130 (L) 12/13/2020   NA 132 (L) 12/12/2020   NA 129 (L) 12/11/2020   NA 126 (L) 12/10/2020   NA 124 (L) 12/10/2020  -Appears stable will need to be monitored closely by PCP  Generalized weakness. -Multifactorial stroke in September 2021, Hyponatremia.  Chronic Diastolic CHF -Strict in and out  -6.8 L -Daily weight Filed Weights   12/09/20 0507 12/09/20 1900 12/13/20 0511  Weight: 78.6 kg 76.2 kg 79.3 kg   -Amlodipine 10 mg daily -2/16 isosorbide dinitrate 30 mg BID (hold) secondary to hypotension.  If patient's BP begins to spike at SNF would restart this medication first -2/16 DC lisinopril -Toprol 75 mg daily -Echocardiogram done in September 2021 with grade 1 diastolic  dysfunction.  Essential hypertension. -See CHF   HX of anxiety/depression. -No acute concern -Continue home dose of alprazolam as needed.  Will attempt to titrate patient off of this medication, and if needed place on a longer acting agent given that SSRI has caused hyponatremia.  This will need to be accomplished at SNF given that patient has been on medication for significant time per son  Sundowning. -Patient is exhibiting signs of sundowning which is very common in this age group.Some improvement after adding Seroquel. -Continuelow-dose Seroquel at night  Hyperkalemia -Resolved   Discharge Diagnoses:  Active Problems:   Coronary artery disease   Stroke (cerebrum) (HCC)   Subarachnoid hematoma (HCC)   SAH (subarachnoid hemorrhage) (HCC)   Fall   Acute hyponatremia   Pressure injury of skin   Chronic diastolic CHF (congestive heart failure) (HCC)   Sundowning   Generalized weakness   Discharge Condition: Stable  Diet recommendation: Renal diet fluid restriction  Filed Weights   12/09/20 0507 12/09/20 1900 12/13/20 0511  Weight: 78.6 kg 76.2 kg 79.3 kg    History of present illness:  87 y.o.WF PMHx Depression, HTN, HLD, CAD, COPD,Hx  stroke   Presents to the ED for chief concern offalling with head trauma and loss of consciousness.  She reports that a doorbell ring as she was getting up from her chair to answer the door and the next thing she remembers she woke up and she was laying on her back on the floor. She denies presyncopal feelings of chest pain, shortness of breath, dizziness.  She endorses that in the last couple of months she has been feeling very weak. She notes that Zoloft was started a few months ago and  since then she has felt very lethargic and weak.  CT head concerning for subarachnoid hemorrhage, repeat CT with stable changes. Neurosurgery recommending conservative management. Also found to have  hyponatremia.  Repeated another CT when she was becoming more agitated and it shows resolution of prior findings and no new abnormality.  Persistent hyponatremia,nephrology was consulted and she received 1 dose of tolvaptan yesterday,another repeat today.  Hospital Course:  See above   Consultations: Neurosurgery  Cultures   2/17 SARS coronavirus negative  Antibiotics Anti-infectives (From admission, onward)   None       Discharge Exam: Vitals:   12/12/20 2034 12/13/20 0001 12/13/20 0511 12/13/20 0731  BP: (!) 128/57 124/67 (!) 117/55 125/65  Pulse: (!) 103 92 75 (!) 103  Resp: 18 20 17 16   Temp: 98.4 F (36.9 C) 98.8 F (37.1 C) 98.7 F (37.1 C) 97.6 F (36.4 C)  TempSrc:      SpO2: 94% 95% 95% 97%  Weight:   79.3 kg   Height:        General: A/O x2 (does not know where, why) pleasant follows all commands, No acute respiratory distress Eyes: negative scleral hemorrhage, negative anisocoria, negative icterus ENT: Negative Runny nose, negative gingival bleeding, Neck:  Negative scars, masses, torticollis, lymphadenopathy, JVD Lungs: Clear to auscultation bilaterally without wheezes or crackles Cardiovascular: Regular rate and rhythm without murmur gallop or rub normal S1 and S2   Discharge Instructions   Allergies as of 12/13/2020   No Known Allergies     Medication List    STOP taking these medications   acetaminophen 650 MG CR tablet Commonly known as: TYLENOL Replaced by: acetaminophen 500 MG tablet   isosorbide dinitrate 30 MG tablet Commonly known as: ISORDIL   lisinopril 10 MG tablet Commonly known as: ZESTRIL   sertraline 25 MG tablet Commonly known as: ZOLOFT   spironolactone 25 MG tablet Commonly known as: ALDACTONE     TAKE these medications   acetaminophen 500 MG tablet Commonly known as: TYLENOL Take 2 tablets (1,000 mg total) by mouth every 6 (six) hours as needed for mild pain, headache or fever. Replaces: acetaminophen  650 MG CR tablet   ALPRAZolam 0.25 MG tablet Commonly known as: XANAX Take 0.25 mg by mouth 2 (two) times daily as needed for anxiety.   amLODipine 10 MG tablet Commonly known as: NORVASC Take 10 mg by mouth daily.   aspirin 81 MG EC tablet Take 1 tablet (81 mg total) by mouth daily. Swallow whole.   feeding supplement Liqd Take 237 mLs by mouth 2 (two) times daily between meals.   Ipratropium-Albuterol 20-100 MCG/ACT Aers respimat Commonly known as: COMBIVENT Inhale 1 puff into the lungs in the morning, at noon, in the evening, and at bedtime.   Iron 325 (65 Fe) MG Tabs Take 325 mg by mouth every Monday, Wednesday, and Friday.   metoprolol succinate 50 MG 24 hr tablet Commonly known as: TOPROL-XL Take 75 mg by mouth daily.   multivitamin with minerals Tabs tablet Take 1 tablet by mouth daily.   ondansetron 4 MG tablet Commonly known as: ZOFRAN Take 1 tablet (4 mg total) by mouth every 6 (six) hours as needed for nausea.   pantoprazole 40 MG tablet Commonly known as: PROTONIX Take 40 mg by mouth 2 (two) times daily.   polyethylene glycol 17 g packet Commonly known as: MIRALAX / GLYCOLAX Take 17 g by mouth daily.   polyvinyl alcohol 1.4 % ophthalmic solution Commonly known as: LIQUIFILM  TEARS Place 1 drop into both eyes as needed for dry eyes.   QUEtiapine 25 MG tablet Commonly known as: SEROQUEL Take 1 tablet (25 mg total) by mouth at bedtime.   simvastatin 40 MG tablet Commonly known as: ZOCOR Take 40 mg by mouth daily.   vitamin B-12 1000 MCG tablet Commonly known as: CYANOCOBALAMIN Take 1,000 mcg by mouth daily.      No Known Allergies  Contact information for after-discharge care    Destination    [facility] .   Service: Skilled Nursing Contact information: [address] [place] [address] [phone]                   The results of  significant diagnostics from this hospitalization (including imaging, microbiology, ancillary and laboratory) are listed below for reference.    Significant Diagnostic Studies: CT HEAD WO CONTRAST  Result Date: 12/07/2020 CLINICAL DATA:  87 year old female status post fall with small volume subarachnoid hemorrhage on 12/05/2020. Subsequent encounter. EXAM: CT HEAD WITHOUT CONTRAST TECHNIQUE: Contiguous axial images were obtained from the base of the skull through the vertex without intravenous contrast. COMPARISON:  Head CT 12/05/2020.  Brain MRI 07/13/2020. FINDINGS: Brain: Right PCA territory encephalomalacia from the September infarct. Small volume subarachnoid hemorrhage over the anterior superior frontal convexities and at the vertex has regressed, with trace residual (series 3, image 20). No intraventricular hemorrhage or ventriculomegaly. No new intracranial blood products. Stable gray-white matter differentiation throughout the brain. No cortically based acute infarct identified. Vascular: Calcified atherosclerosis at the skull base. No suspicious intracranial vascular hyperdensity. Skull: Stable, intact. Sinuses/Orbits: Trace fluid in the left sphenoid appears stable and inflammatory. Other Visualized paranasal sinuses and mastoids are stable and well pneumatized. Other: No acute orbit or scalp soft tissue injury. IMPRESSION: 1. Regressed small volume posttraumatic subarachnoid hemorrhage. Trace residual. 2. No new intracranial abnormality. Right PCA territory encephalomalacia from the September infarct. Electronically Signed   By: Odessa Fleming M.D.   On: 12/07/2020 10:25   CT Head Wo Contrast  Result Date: 12/05/2020 CLINICAL DATA:  Head trauma.  6 hour repeat study. EXAM: CT HEAD WITHOUT CONTRAST TECHNIQUE: Contiguous axial images were obtained from the base of the skull through the vertex without intravenous contrast. COMPARISON:  CT head from the same day. FINDINGS: Brain: Similar bifrontal  subarachnoid hemorrhage. Slight increase in volume/conspicuity of subarachnoid hemorrhage along the left posterior frontal convexity (series 2, image 22). Similar possible small hypodense subdural collections, as described on the prior. No evidence of acute large vascular territory infarct. Similar remote right occipital lobe infarct with encephalomalacia. Similar patchy white matter hypoattenuation, likely related to chronic microvascular ischemic change. Similar 6 mm calcified mass along the inner table of the right frontal bone, likely representing a meningioma without substantial mass effect. Basal cisterns are patent. Vascular: Calcific atherosclerosis. Skull: No acute fracture.  Left posterior scalp contusion. Sinuses/Orbits: Visualized sinuses are clear.  Unremarkable orbits. Other: No mastoid effusions. IMPRESSION: 1. Similar bifrontal subarachnoid hemorrhage. Slight increase in volume/conspicuity of subarachnoid hemorrhage along the left posterior frontal convexity, which could represent redistribution of hemorrhage or a small volume of new hemorrhage. No substantial mass effect. 2. Similar suspected low density subdural collections, as described on the prior. 3. Remote right occipital infarct and moderate chronic microvascular ischemic change. Electronically Signed   By: Feliberto Harts MD   On: 12/05/2020 17:04   CT Head Wo Contrast  Result Date:  12/05/2020 CLINICAL DATA:  Head trauma Found on floor this morning EXAM: CT HEAD WITHOUT CONTRAST TECHNIQUE: Contiguous axial images were obtained from the base of the skull through the vertex without intravenous contrast. COMPARISON:  07/14/2020 FINDINGS: Brain: Diffuse cortical atrophy. Chronic encephalomalacia of the right occipital lobe consistent with remote infarct. Confluent periventricular white matter hypodensity consistent with chronic ischemic small vessel disease. Subarachnoid hemorrhage noted in the frontal lobes bilaterally. Multiple foci of  acute subarachnoid hemorrhage noted overlying the left frontal and parietal lobes. Findings are suspicious for bilateral hypodense subdural fluid collections which have increased since the prior examination. This may be due to chronic subdural hematomas or hygromas. No midline shift. 6 mm calcified mass originating from the inner table of the right frontal bone is unchanged from prior examination and likely related to a meningioma. No acute infarct. Vascular: No hyperdense vessel or unexpected calcification. Skull: Normal. Negative for fracture or focal lesion. Sinuses/Orbits: No acute finding. Other: Mild hematoma of the left posterior scalp. IMPRESSION: 1. Acute subarachnoid hemorrhage involving the anterior frontal lobes bilaterally. 2. Acute subarachnoid hematoma overlying the left frontal and parietal lobes. 3. Interval increase of low-density subdural collections overlying both cerebral hemispheres suspicious for chronic subdural hematomas or hygromas. These results were called by telephone at the time of interpretation on 12/05/2020 at 10:52 am to provider Willy Eddy , who verbally acknowledged these results. Electronically Signed   By: Acquanetta Belling M.D.   On: 12/05/2020 10:52   CT Cervical Spine Wo Contrast  Result Date: 12/05/2020 CLINICAL DATA:  Neck trauma.  Fall. EXAM: CT CERVICAL SPINE WITHOUT CONTRAST TECHNIQUE: Multidetector CT imaging of the cervical spine was performed without intravenous contrast. Multiplanar CT image reconstructions were also generated. COMPARISON:  None. FINDINGS: Alignment: Within normal limits Skull base and vertebrae: No acute fracture. No primary bone lesion or focal pathologic process. Soft tissues and spinal canal: No prevertebral fluid or swelling. No visible canal hematoma. Disc levels: No significant disc height loss. Vertebral body heights are maintained. Mild degenerative changes seen throughout the cervical spine. Upper chest: Negative. Other: None.  IMPRESSION: No acute abnormality of the cervical or visualized upper thoracic spine. Electronically Signed   By: Acquanetta Belling M.D.   On: 12/05/2020 10:59   DG Chest Portable 1 View  Result Date: 12/05/2020 CLINICAL DATA:  Weakness.  Unwitnessed fall EXAM: PORTABLE CHEST 1 VIEW COMPARISON:  July 24, 2020 FINDINGS: There is cardiomegaly with pulmonary vascularity within normal limits. No edema or airspace opacity. There is an apparent hiatal hernia. There is aortic atherosclerosis. No adenopathy. No bone lesions. IMPRESSION: No edema or airspace opacity. Cardiomegaly. Apparent hiatal hernia. Aortic Atherosclerosis (ICD10-I70.0). Electronically Signed   By: Bretta Bang III M.D.   On: 12/05/2020 10:30    Microbiology: Recent Results (from the past 240 hour(s))  Resp Panel by RT-PCR (Flu A&B, Covid) Nasopharyngeal Swab     Status: None   Collection Time: 12/05/20  6:59 PM   Specimen: Nasopharyngeal Swab; Nasopharyngeal(NP) swabs in vial transport medium  Result Value Ref Range Status   SARS Coronavirus 2 by RT PCR NEGATIVE NEGATIVE Final    Comment: (NOTE) SARS-CoV-2 target nucleic acids are NOT DETECTED.  The SARS-CoV-2 RNA is generally detectable in upper respiratory specimens during the acute phase of infection. The lowest concentration of SARS-CoV-2 viral copies this assay can detect is 138 copies/mL. A negative result does not preclude SARS-Cov-2 infection and should not be used as the sole basis for treatment or other patient management  decisions. A negative result may occur with  improper specimen collection/handling, submission of specimen other than nasopharyngeal swab, presence of viral mutation(s) within the areas targeted by this assay, and inadequate number of viral copies(<138 copies/mL). A negative result must be combined with clinical observations, patient history, and epidemiological information. The expected result is Negative.  Fact Sheet for Patients:   BloggerCourse.com  Fact Sheet for Healthcare Providers:  SeriousBroker.it  This test is no t yet approved or cleared by the [place] FDA and  has been authorized for detection and/or diagnosis of SARS-CoV-2 by FDA under an Emergency Use Authorization (EUA). This EUA will remain  in effect (meaning this test can be used) for the duration of the COVID-19 declaration under Section 564(b)(1) of the Act, 21 U.S.C.section 360bbb-3(b)(1), unless the authorization is terminated  or revoked sooner.       Influenza A by PCR NEGATIVE NEGATIVE Final   Influenza B by PCR NEGATIVE NEGATIVE Final    Comment: (NOTE) The Xpert Xpress SARS-CoV-2/FLU/RSV plus assay is intended as an aid in the diagnosis of influenza from Nasopharyngeal swab specimens and should not be used as a sole basis for treatment. Nasal washings and aspirates are unacceptable for Xpert Xpress SARS-CoV-2/FLU/RSV testing.  Fact Sheet for Patients: BloggerCourse.com  Fact Sheet for Healthcare Providers: SeriousBroker.it  This test is not yet approved or cleared by the [place] FDA and has been authorized for detection and/or diagnosis of SARS-CoV-2 by FDA under an Emergency Use Authorization (EUA). This EUA will remain in effect (meaning this test can be used) for the duration of the COVID-19 declaration under Section 564(b)(1) of the Act, 21 U.S.C. section 360bbb-3(b)(1), unless the authorization is terminated or revoked.  Performed at [facility], [address] [place], [place] [address]   SARS CORONAVIRUS 2 (TAT 6-24 HRS) Nasopharyngeal Nasopharyngeal Swab     Status: None   Collection Time: 12/12/20 10:27 AM   Specimen: Nasopharyngeal Swab  Result Value Ref Range Status   SARS Coronavirus 2 NEGATIVE NEGATIVE Final    Comment: (NOTE) SARS-CoV-2 target nucleic acids are NOT DETECTED.  The  SARS-CoV-2 RNA is generally detectable in upper and lower respiratory specimens during the acute phase of infection. Negative results do not preclude SARS-CoV-2 infection, do not rule out co-infections with other pathogens, and should not be used as the sole basis for treatment or other patient management decisions. Negative results must be combined with clinical observations, patient history, and epidemiological information. The expected result is Negative.  Fact Sheet for Patients: HairSlick.no  Fact Sheet for Healthcare Providers: quierodirigir.com  This test is not yet approved or cleared by the [place] FDA and  has been authorized for detection and/or diagnosis of SARS-CoV-2 by FDA under an Emergency Use Authorization (EUA). This EUA will remain  in effect (meaning this test can be used) for the duration of the COVID-19 declaration under Se ction 564(b)(1) of the Act, 21 U.S.C. section 360bbb-3(b)(1), unless the authorization is terminated or revoked sooner.  Performed at [facility], [address] [place], [place] [address]      Labs: Basic Metabolic Panel: Recent Labs  Lab 12/09/20 0854 12/09/20 1920 12/10/20 0310 12/10/20 0830 12/11/20 0437 12/11/20 1536 12/12/20 1043 12/12/20 1606 12/13/20 0440  NA 120*   < > 124* 126* 129*  --  132*  --  130*  K 4.6  --   --  4.9 5.3* 5.7* 4.2 4.9 3.7  CL 90*  --   --  94* 96*  --  97*  --  94*  CO2 21*  --   --  25 23  --  26  --  27  GLUCOSE 103*  --   --  108* 112*  --  93  --  97  BUN 24*  --   --  31* 44*  --  37*  --  39*  CREATININE 0.63  --   --  0.99 1.29*  --  0.95  --  0.84  CALCIUM 8.9  --   --  9.4 9.5  --  9.1  --  9.1  MG  --   --   --   --   --  2.1 2.0 2.0 2.0  PHOS  --   --   --   --   --   --  4.4  --  4.0   < > = values in this interval not displayed.   Liver Function Tests: Recent Labs  Lab 12/09/20 0854 12/12/20 1043  12/13/20 0440  AST 19 23 18   ALT 17 17 16   ALKPHOS 54 60 58  BILITOT 1.1 1.0 0.7  PROT 6.0* 6.0* 5.6*  ALBUMIN 3.3* 3.1* 2.9*   No results for input(s): LIPASE, AMYLASE in the last 168 hours. No results for input(s): AMMONIA in the last 168 hours. CBC: Recent Labs  Lab 12/07/20 0511 12/10/20 0310 12/12/20 1121 12/13/20 0440  WBC 8.0 6.2 7.7 7.0  NEUTROABS  --  3.8  --   --   HGB 15.7* 13.9 15.5* 13.9  HCT 45.9 41.4 48.2* 42.1  MCV 85.5 86.4 90.8 89.0  PLT 238 200 161 183   Cardiac Enzymes: No results for input(s): CKTOTAL, CKMB, CKMBINDEX, TROPONINI in the last 168 hours. BNP: BNP (last 3 results) Recent Labs    07/18/20 0443  BNP 339.1*    ProBNP (last 3 results) No results for input(s): PROBNP in the last 8760 hours.  CBG: Recent Labs  Lab 12/13/20 0000  GLUCAP 109*       Signed:  Carolyne Littles, MD [facility]

## 2020-12-13 NOTE — Progress Notes (Signed)
Physical Therapy Treatment Patient Details Name: Ashley [name] MRN: [id number] DOB: 02-Feb-1931 Today's Date: 12/13/2020    History of Present Illness Pt is an 87 y/o F admitted on 12/05/20 with c/c of head trauma & LOC after falling. CT head concerning for subarachnoid hemorrhage, repeat CT with stable changes.  Neurosurgery recommending conservative management. Also found to have hyponatremia. PMH: HTN, HLD, CAD, COPD, depression, hx of stroke, anxiety, MI, PVD, GERD    PT Comments    Pt continues to require heavy assistance for bed mobility but is able to complete transfers from recliner & elevated EOB with little assistance on this date, even progressing to ambulating short distances in room with RW! Pt requires heavy cuing/assist for RW management & is limited by impaired cognition & HOH but is pleasant & motivated to participate. Pt's son present & very pleased with pt's progress during session. Continue to recommend STR upon d/c to maximize independence with functional mobility & reduce fall risk prior to return home.    Follow Up Recommendations  SNF     Equipment Recommendations  None recommended by PT    Recommendations for Other Services       Precautions / Restrictions Precautions Precautions: Fall Restrictions Weight Bearing Restrictions: No    Mobility  Bed Mobility Overal bed mobility: Needs Assistance Bed Mobility: Supine to Sit     Supine to sit: Max assist;HOB elevated     General bed mobility comments: max cuing for hand placement, pt able to hold to bed rail with cuing    Transfers Overall transfer level: Needs assistance   Transfers: Sit to/from Stand;Stand Pivot Transfers Sit to Stand: Min assist;From elevated surface Stand pivot transfers: Min assist       General transfer comment: assistance with RW management  Ambulation/Gait Ambulation/Gait assistance: Min assist Gait Distance (Feet): 15 Feet Assistive device: Rolling walker (2  wheeled) Gait Pattern/deviations: Decreased step length - right;Decreased step length - left;Decreased stride length;Decreased dorsiflexion - right;Decreased dorsiflexion - left Gait velocity: decreased   General Gait Details: pt able to ambulate forwards & backwards short distance in room x 2 before requiring seated rest break, cuing & assistance with managing RW & turning   Stairs             Wheelchair Mobility    Modified Rankin (Stroke Patients Only)       Balance Overall balance assessment: Needs assistance Sitting-balance support: Bilateral upper extremity supported;Feet unsupported;Feet supported Sitting balance-Leahy Scale: Good Sitting balance - Comments: supervision sitting EOB with no LOB noted, pt fearful of falling when PT assists pt (max assist) with scooting to EOB   Standing balance support: Bilateral upper extremity supported;During functional activity Standing balance-Leahy Scale: Poor Standing balance comment: BUE support on RW with min assist                            Cognition Arousal/Alertness: Awake/alert Behavior During Therapy: WFL for tasks assessed/performed Overall Cognitive Status: History of cognitive impairments - at baseline Area of Impairment: Orientation;Memory;Safety/judgement;Following commands;Problem solving                 Orientation Level: Disoriented to;Place;Time;Situation   Memory: Decreased short-term memory Following Commands: Follows one step commands inconsistently;Follows one step commands with increased time Safety/Judgement: Decreased awareness of deficits;Decreased awareness of safety   Problem Solving: Slow processing;Difficulty sequencing;Requires verbal cues;Requires tactile cues General Comments: Pt pleasant & cooperative throughout, somewhat limited by [facility], initiates sit>stand  from EOB before PT is ready, requires assistance for managing RW in room, max cuing for step by step (ex: turn to sit,  reach back, sit down)      Exercises      General Comments General comments (skin integrity, edema, etc.): vitals at beginning of session: BP 115/56 mmHg (RUE), HR 95 bpm, SpO2 97% on room air      Pertinent Vitals/Pain Pain Assessment: Faces Faces Pain Scale: Hurts a little bit Pain Location: RUE Pain Descriptors / Indicators: Aching;Sore Pain Intervention(s): Monitored during session;Patient requesting pain meds-RN notified;RN gave pain meds during session    Home Living                      Prior Function            PT Goals (current goals can now be found in the care plan section) Acute Rehab PT Goals Patient Stated Goal: to go to rehab PT Goal Formulation: With family Time For Goal Achievement: 12/21/20 Potential to Achieve Goals: Good Progress towards PT goals: Progressing toward goals    Frequency    Min 2X/week      PT Plan Current plan remains appropriate    Co-evaluation              AM-PAC PT "6 Clicks" Mobility   Outcome Measure  Help needed turning from your back to your side while in a flat bed without using bedrails?: Total Help needed moving from lying on your back to sitting on the side of a flat bed without using bedrails?: Total Help needed moving to and from a bed to a chair (including a wheelchair)?: A Lot Help needed standing up from a chair using your arms (e.g., wheelchair or bedside chair)?: A Little Help needed to walk in hospital room?: A Lot Help needed climbing 3-5 steps with a railing? : Total 6 Click Score: 10    End of Session Equipment Utilized During Treatment: Gait belt Activity Tolerance: Patient tolerated treatment well Patient left: in chair;with call bell/phone within reach;with chair alarm set;with SCD's reapplied;with family/visitor present Nurse Communication: Mobility status PT Visit Diagnosis: Unsteadiness on feet (R26.81);Muscle weakness (generalized) (M62.81);Difficulty in walking, not elsewhere  classified (R26.2)     Time: 7425-9563 PT Time Calculation (min) (ACUTE ONLY): 26 min  Charges:  $Therapeutic Activity: 23-37 mins                     Lavone Nian, PT, DPT 12/13/20, 10:31 AM    Waunita Schooner 12/13/2020, 10:29 AM

## 2020-12-13 NOTE — Care Management Important Message (Signed)
Important Message  Patient Details  Name: JAQUANNA BALLENTINE MRN: [id number] Date of Birth: [birth year]   Medicare Important Message Given:  Yes     Juliann Pulse A Syre Knerr 12/13/2020, 10:03 AM

## 2020-12-13 NOTE — Progress Notes (Signed)
[place], [place] 12/13/20  Subjective:   Ashley [name] is a 87 y.o. female with PMHX of HTN, hyperlipidemia, CAD, COPD, depression and stroke. She presented to the ED after falling, hitting her head and LOC. She reports getting up to answer the door and only remembers waking up on the floor. She reports starting Zoloft about 2 months ago and feeling week and lethargic since that time.   She was admitted with a subarachnoid hematoma.   Patient resting comfortable She is alert and able to eat meals. No complaints of shortness of breath or chest pain  Foley-1L  Objective:  Vital signs in last 24 hours:  Temp:  [97.5 F (36.4 C)-98.8 F (37.1 C)] 97.6 F (36.4 C) (02/18 0731) Pulse Rate:  [75-103] 103 (02/18 0731) Resp:  [16-20] 16 (02/18 0731) BP: (117-128)/(55-68) 125/65 (02/18 0731) SpO2:  [94 %-98 %] 97 % (02/18 0731) Weight:  [79.3 kg] 79.3 kg (02/18 0511)  Weight change:  Filed Weights   12/09/20 0507 12/09/20 1900 12/13/20 0511  Weight: 78.6 kg 76.2 kg 79.3 kg    Intake/Output:    Intake/Output Summary (Last 24 hours) at 12/13/2020 1011 Last data filed at 12/13/2020 0867 Gross per 24 hour  Intake 240 ml  Output 1200 ml  Net -960 ml   Physical Exam: General: NAD, laying in bed  Head: Normocephalic, atraumatic. dry oral mucosal membranes  Eyes: Anicteric  Neck: Supple, trachea midline  Lungs:  Clear to auscultation  Heart: Regular rate and rhythm  Abdomen:  Soft, nontender,   Extremities:  no peripheral edema.  Neurologic: Alert and oriented, confused at times  Skin: No lesions    Basic Metabolic Panel:  Recent Labs  Lab 12/09/20 0854 12/09/20 1920 12/10/20 0310 12/10/20 0830 12/11/20 0437 12/11/20 1536 12/12/20 1043 12/12/20 1606 12/13/20 0440  NA 120*   < > 124* 126* 129*  --  132*  --  130*  K 4.6  --   --  4.9 5.3* 5.7* 4.2 4.9 3.7  CL 90*  --   --  94* 96*  --  97*  --  94*  CO2 21*  --   --  25 23  --  26  --   27  GLUCOSE 103*  --   --  108* 112*  --  93  --  97  BUN 24*  --   --  31* 44*  --  37*  --  39*  CREATININE 0.63  --   --  0.99 1.29*  --  0.95  --  0.84  CALCIUM 8.9  --   --  9.4 9.5  --  9.1  --  9.1  MG  --   --   --   --   --  2.1 2.0 2.0 2.0  PHOS  --   --   --   --   --   --  4.4  --  4.0   < > = values in this interval not displayed.     CBC: Recent Labs  Lab 12/07/20 0511 12/10/20 0310 12/12/20 1121 12/13/20 0440  WBC 8.0 6.2 7.7 7.0  NEUTROABS  --  3.8  --   --   HGB 15.7* 13.9 15.5* 13.9  HCT 45.9 41.4 48.2* 42.1  MCV 85.5 86.4 90.8 89.0  PLT 238 200 161 183     No results found for: HEPBSAG, HEPBSAB, HEPBIGM    Microbiology:  Recent Results (from the past 240  hour(s))  Resp Panel by RT-PCR (Flu A&B, Covid) Nasopharyngeal Swab     Status: None   Collection Time: 12/05/20  6:59 PM   Specimen: Nasopharyngeal Swab; Nasopharyngeal(NP) swabs in vial transport medium  Result Value Ref Range Status   SARS Coronavirus 2 by RT PCR NEGATIVE NEGATIVE Final    Comment: (NOTE) SARS-CoV-2 target nucleic acids are NOT DETECTED.  The SARS-CoV-2 RNA is generally detectable in upper respiratory specimens during the acute phase of infection. The lowest concentration of SARS-CoV-2 viral copies this assay can detect is 138 copies/mL. A negative result does not preclude SARS-Cov-2 infection and should not be used as the sole basis for treatment or other patient management decisions. A negative result may occur with  improper specimen collection/handling, submission of specimen other than nasopharyngeal swab, presence of viral mutation(s) within the areas targeted by this assay, and inadequate number of viral copies(<138 copies/mL). A negative result must be combined with clinical observations, patient history, and epidemiological information. The expected result is Negative.  Fact Sheet for Patients:  EntrepreneurPulse.com.au  Fact Sheet for Healthcare  Providers:  IncredibleEmployment.be  This test is no t yet approved or cleared by the [place] FDA and  has been authorized for detection and/or diagnosis of SARS-CoV-2 by FDA under an Emergency Use Authorization (EUA). This EUA will remain  in effect (meaning this test can be used) for the duration of the COVID-19 declaration under Section 564(b)(1) of the Act, 21 U.S.C.section 360bbb-3(b)(1), unless the authorization is terminated  or revoked sooner.       Influenza A by PCR NEGATIVE NEGATIVE Final   Influenza B by PCR NEGATIVE NEGATIVE Final    Comment: (NOTE) The Xpert Xpress SARS-CoV-2/FLU/RSV plus assay is intended as an aid in the diagnosis of influenza from Nasopharyngeal swab specimens and should not be used as a sole basis for treatment. Nasal washings and aspirates are unacceptable for Xpert Xpress SARS-CoV-2/FLU/RSV testing.  Fact Sheet for Patients: EntrepreneurPulse.com.au  Fact Sheet for Healthcare Providers: IncredibleEmployment.be  This test is not yet approved or cleared by the [place] FDA and has been authorized for detection and/or diagnosis of SARS-CoV-2 by FDA under an Emergency Use Authorization (EUA). This EUA will remain in effect (meaning this test can be used) for the duration of the COVID-19 declaration under Section 564(b)(1) of the Act, 21 U.S.C. section 360bbb-3(b)(1), unless the authorization is terminated or revoked.  Performed at [facility], [facility], [facility], [place], [place] [address]   SARS CORONAVIRUS 2 (TAT 6-24 HRS) Nasopharyngeal Nasopharyngeal Swab     Status: None   Collection Time: 12/12/20 10:27 AM   Specimen: Nasopharyngeal Swab  Result Value Ref Range Status   SARS Coronavirus 2 NEGATIVE NEGATIVE Final    Comment: (NOTE) SARS-CoV-2 target nucleic acids are NOT DETECTED.  The SARS-CoV-2 RNA is generally detectable in upper and lower respiratory  specimens during the acute phase of infection. Negative results do not preclude SARS-CoV-2 infection, do not rule out co-infections with other pathogens, and should not be used as the sole basis for treatment or other patient management decisions. Negative results must be combined with clinical observations, patient history, and epidemiological information. The expected result is Negative.  Fact Sheet for Patients: SugarRoll.be  Fact Sheet for Healthcare Providers: https://www.woods-mathews.com/  This test is not yet approved or cleared by the [place] FDA and  has been authorized for detection and/or diagnosis of SARS-CoV-2 by FDA under an Emergency Use Authorization (EUA). This EUA will remain  in effect (meaning this test can be used) for the duration of the COVID-19 declaration under Se ction 564(b)(1) of the Act, 21 U.S.C. section 360bbb-3(b)(1), unless the authorization is terminated or revoked sooner.  Performed at [facility], [place] [address], [place], [place] [address]     Coagulation Studies: No results for input(s): LABPROT, INR in the last 72 hours.  Urinalysis: No results for input(s): COLORURINE, LABSPEC, PHURINE, GLUCOSEU, HGBUR, BILIRUBINUR, KETONESUR, PROTEINUR, UROBILINOGEN, NITRITE, LEUKOCYTESUR in the last 72 hours.  Invalid input(s): APPERANCEUR    Imaging: No results found.   Medications:    . amLODipine  10 mg Oral Daily  . aspirin EC  81 mg Oral Daily  . Chlorhexidine Gluconate Cloth  6 each Topical Daily  . feeding supplement  237 mL Oral BID BM  . ferrous sulfate  325 mg Oral Q M,W,F  . Ipratropium-Albuterol  1 puff Inhalation QID  . metoprolol succinate  75 mg Oral Daily  . multivitamin with minerals  1 tablet Oral Daily  . pantoprazole  40 mg Oral BID  . polyethylene glycol  17 g Oral Daily  . QUEtiapine  25 mg Oral QHS  . simvastatin  40 mg Oral QHS  . sodium chloride flush  10-40 mL  Intracatheter Q12H  . vitamin B-12  1,000 mcg Oral Daily   acetaminophen, ALPRAZolam, metoprolol tartrate, ondansetron **OR** ondansetron (ZOFRAN) IV, polyvinyl alcohol, sodium chloride flush  Assessment/ Plan:  87 y.o. female with  PMHX of HTN, hyperlipidemia, CAD, COPD, depression and stroke was admitted on 12/05/2020  Active Problems:   Coronary artery disease   Stroke (cerebrum) ([place])   Subarachnoid hematoma (H[facility])   SAH (subarachnoid hemorrhage) ([place])   Fall   Acute hyponatremia   Pressure injury of skin   Chronic diastolic CHF (congestive heart failure) (H[facility])   Sundowning   Generalized weakness  Acute hyponatremia [E87.1] SAH (subarachnoid hemorrhage) ([place]) [I60.9] Subarachnoid hematoma ([place]) [S06.6X9A] Fall, initial encounter [W19.XXXA] Subarachnoid hemorrhage ([place]) [I60.9]  #. Hyponatremia -current level-130 -Tolvaptan 15mg  given once during admission -Fluid Restriction 1269ml  #. Hypertension -BP controlled -Currently prescribed Amlodipine, lisinopril, and metoprolol   #. Diastolic CHF Not acute exacerbation  # Hyperkalemia -Current level-3.7 -Renal diet with 1200 ml fluid restriction   #. Diabetes type 2 with CKD Hgb A1c MFr Bld (%)  Date Value  07/13/2020 5.2  Acceptable glucose levels    LOS: 7 Aerilynn Goin,NP 2/18/202210:11 [place], [place]

## 2020-12-13 NOTE — Progress Notes (Incomplete)
Nutrition Follow-up  DOCUMENTATION CODES:   Obesity unspecified  INTERVENTION:  ***  -continue MVI with mineral, daily  -   NUTRITION DIAGNOSIS:   Increased nutrient needs related to catabolic illness (COPD) as evidenced by estimated needs.  ***  GOAL:   Patient will meet greater than or equal to 90% of their needs  ***  MONITOR:   PO intake,Supplement acceptance,Labs,Weight trends,Skin,I & O's  REASON FOR ASSESSMENT:   Malnutrition Screening Tool    ASSESSMENT:   87 y.o. female with medical history significant for hypertension, hyperlipidemia, CAD, COPD, depression and stroke who is admitted with SAH. diastolic CHF, hyponatremia  ***   Pt has been consuming an average of 60% since 02/15.  Pt's weights reviewed and no significant weight changes noted.   Meds Reviewed: Ensure Enlive (BID), ferrous sulfat (325 mg, MWF), MVI, Miralax (17 g, daily), Cyanocobalamin (1000 mcg, daily)     Labs Reviewed: Sodium (130 mmol/L), Chloride (94 mmol/L), BUN (mg/dL), Glucose (97 mg/dL)  Diet Order:   Diet Order            Diet renal with fluid restriction Fluid restriction: 1200 mL Fluid; Room service appropriate? Yes with Assist; Fluid consistency: Thin  Diet effective now                 EDUCATION NEEDS:   Education needs have been addressed  Skin:  Skin Assessment: Skin Integrity Issues: Skin Integrity Issues:: Stage II Stage II: Sacrum  Last BM:  12/12/20 (type 7)  Height:   Ht Readings from Last 1 Encounters:  12/09/20 5\' 2"  (1.575 m)    Weight:   Wt Readings from Last 1 Encounters:  12/13/20 79.3 kg    Ideal Body Weight:  54.5 kg  BMI:  Body mass index is 31.99 kg/m.  Estimated Nutritional Needs:   Kcal:  1600-1800kcal/day  Protein:  80-90g/day  Fluid:  1.6L/day    ***

## 2020-12-13 NOTE — Plan of Care (Signed)
  Problem: Education: Goal: Knowledge of General Education information will improve Description: Including pain rating scale, medication(s)/side effects and non-pharmacologic comfort measures Outcome: Adequate for Discharge   Problem: Clinical Measurements: Goal: Ability to maintain clinical measurements within normal limits will improve Outcome: Adequate for Discharge Goal: Will remain free from infection Outcome: Adequate for Discharge Goal: Diagnostic test results will improve Outcome: Adequate for Discharge Goal: Respiratory complications will improve Outcome: Adequate for Discharge Goal: Cardiovascular complication will be avoided Outcome: Adequate for Discharge   Problem: Activity: Goal: Risk for activity intolerance will decrease Outcome: Adequate for Discharge

## 2020-12-16 DIAGNOSIS — F411 Generalized anxiety disorder: Secondary | ICD-10-CM | POA: Insufficient documentation

## 2021-02-04 ENCOUNTER — Other Ambulatory Visit: Payer: Self-pay

## 2021-02-04 ENCOUNTER — Other Ambulatory Visit: Payer: Medicare Other | Admitting: Primary Care

## 2021-02-04 DIAGNOSIS — E871 Hypo-osmolality and hyponatremia: Secondary | ICD-10-CM

## 2021-02-04 DIAGNOSIS — F05 Delirium due to known physiological condition: Secondary | ICD-10-CM

## 2021-02-04 DIAGNOSIS — J449 Chronic obstructive pulmonary disease, unspecified: Secondary | ICD-10-CM

## 2021-02-04 DIAGNOSIS — Z515 Encounter for palliative care: Secondary | ICD-10-CM

## 2021-02-04 NOTE — Progress Notes (Signed)
[facility] Consult Note Telephone: [phone]  Fax: [phone]    Date of encounter: 02/04/21 PATIENT NAME: Ashley [name] [address] [place] [address]   [phone] (home)  DOB: 1931-05-30 MRN: [id number] PRIMARY CARE PROVIDER:    Albina Billet, MD,  [address]   [place] [address] [phone]  REFERRING PROVIDER:   Albina Billet, MD [address]   [place],  [place] [address] [phone]  RESPONSIBLE PARTY:    Contact Information    Name Relation Home Work Mobile   Kemaya, Dorner [phone]     Steele,Cheryl Daughter [phone]  [phone]   Altheia, Shafran [name] Son   [phone]      I met face to face with patient and caregiver in  home. Palliative Care was asked to follow this patient by consultation request of  Albina Billet, MD to address advance care planning and complex medical decision making. This is a follow up.     ASSESSMENT AND PLAN / RECOMMENDATIONS:   Advance Care Planning/Goals of Care: Goals include to maximize quality of life and symptom management. Our advance care planning conversation included a discussion about:     The value and importance of advance care planning   Experiences with loved ones who have been seriously ill or have died   Exploration of personal, cultural or spiritual beliefs that might influence medical decisions   Exploration of goals of care in the event of a sudden injury or illness  Review  of an  advance directive document .  CODE STATUS: DNR  I discussed advanced care planning today with caregiver and patient. Caregiver states that her six children request that she make as many of her health decisions as possible. During our discussion about various treatment modalities patient became confused and unable to decide choices, which she stated. We talked a little about her husband's death which had been at home and patient stated she would  like to pass away at home as well. While the patient should absolutely be involved in this discussion I do feel some input from her power of attorney would be warranted as she does have some mild cognitive impairment and anxiety about the topic. I left the MOST form for family to review. Has DNR in place with [facility] in home.  I spent 20 minutes providing this consultation. More than 50% of the time in this consultation was spent in counseling and care coordination.  --------------------------------------------------------------------------------------------------------------------------------------------------------------------------------------------------------------  Symptom Management/Plan:  Fall prevention s/p hyponatremia; Has had fall and injury since our last meeting, due to hyponatremia. Some med adjustments made and PCP following. We discussed drinking water and monitoring bp. She has a call button but is alone some hours / day. She does have gait abnormality and has a high fall risk. CT from fall in 2/22 showed no new damage but sodium had fallen to 124 on ED presentation.  Depression/anxiety in context of dementia; Pt voices anxiety at being along over night and in afternoon. Some reports of sundowning, which may be more this anxiety process. Family has transitioned off xanax which is wise given fall risk. She was also d/c from sertraline due to hyponatremia. Seroquel appears to be working well in the evening per caregiver.Continue this and monitor for effect and se, and effectiveness.   LE Edema in context of CHF: recent d/c of lasix and toprol adjusted to 25 mg from 50 mg. Appears to be  clinically tolerating. Has 1-2+ Le edema, which would benefit from light compression stockings. Lungs clear today but pt has increased a pound on morning weightsRecommended OTC from [place], with zippers, 10-15 mmhg to control edema and monitoring for chf exacerbation.  Nutrition: States eats well but  albumin is low, 2.6. Recommend attending to intake and dietary supplements.  Follow up Palliative Care Visit: Palliative care will continue to follow for complex medical decision making, advance care planning, and clarification of goals. Return 6-8 weeks or prn.  PPS: 40%  HOSPICE ELIGIBILITY/DIAGNOSIS: TBD  Chief Complaint: Dementia, anxiety  HISTORY OF PRESENT ILLNESS:  Ashley [name] is a 87 y.o. year old female  with recent hospital stay for fall, hyponatremia, anemia, hyperkalemia. These were resolved with treatment and med changes, and pt went to snf for rehab. Back in her home with caregiver. Today presents with some depression and anxiety in context of dementia and chronic illness. She voices fear of bing alone at times. Xanax was stopped and pt has generally been doing better with anxiety control. Her Zoloft had to be stopped d/t low sodium. She has mildly progressive dementia with caregivers part of the day but is alone for 4 hrs in the afternoon, and over night. Caregiver reports some sundowning/ evening anxiety. She has started seroquel with some successful modification of sx.   History obtained from review of EMR, discussion with primary team, and interview with family, facility staff/caregiver and/or Ashley [name].  I reviewed available labs, medications, imaging, studies and related documents from the EMR.  Records reviewed and summarized above.   ROS  General: NAD ENMT: denies dysphagia Cardiovascular: denies chest pain, endorses DOE Pulmonary: endorses cough, denies increased SOB at rest Abdomen: endorses good appetite, denies constipation, endorses continence of bowel GU: denies dysuria, endorses continence of urine MSK: endorses mild weakness,  + Feb  falls reported, none since Skin: denies rashes or wounds Neurological: denies pain, endorses  insomnia Psych: Endorses down mood Heme/lymph/immuno: denies bruises, abnormal bleeding  Physical Exam: Current and past  weights: taking daily, fluctuating around 168 lbs. Constitutional: NAD General: frail appearing, /obese  CV:  1-2+ LE edema, RRR Pulmonary: audible vocal wheezing, LCTA all fields, no increased work of breathing, no cough, room air Abdomen: intake 100%,  no ascites MSK: mild  sarcopenia, moves all extremities, ambulatory with walker Skin: warm and dry, no rashes or wounds on visible skin Neuro:  + generalized weakness,  Moderate to severe cognitive impairment Psych: anxious affect, A and O x 1-2 Hem/lymph/immuno: no widespread bruising  This visit was coded based on medical decision making (MDM).  Thank you for the opportunity to participate in the care of Ashley [name].  The palliative care team will continue to follow. Please call our office at [phone] if we can be of additional assistance.   Jason Coop, NP , DNP, MPH, AGPCNP-BC, ACHPN  COVID-19 PATIENT SCREENING TOOL Asked and negative response unless otherwise noted:   Have you had symptoms of covid, tested positive or been in contact with someone with symptoms/positive test in the past 5-10 days?

## 2021-06-12 ENCOUNTER — Telehealth: Payer: Self-pay | Admitting: Primary Care

## 2021-06-12 NOTE — Telephone Encounter (Signed)
T/c to daughter to book home visit. Appt made.

## 2021-07-01 ENCOUNTER — Other Ambulatory Visit: Payer: Medicare Other | Admitting: Primary Care

## 2021-07-01 ENCOUNTER — Other Ambulatory Visit: Payer: Self-pay

## 2021-07-01 DIAGNOSIS — Z515 Encounter for palliative care: Secondary | ICD-10-CM

## 2021-07-01 DIAGNOSIS — E871 Hypo-osmolality and hyponatremia: Secondary | ICD-10-CM

## 2021-07-01 DIAGNOSIS — J449 Chronic obstructive pulmonary disease, unspecified: Secondary | ICD-10-CM

## 2021-07-01 NOTE — Progress Notes (Signed)
[facility] Consult Note Telephone: [phone]  Fax: [phone]    Date of encounter: 07/01/21 10:23 AM PATIENT NAME: Ashley [name] [address] [place] [address]   [phone] (home)  DOB: Mar 15, 1931 MRN: [id number] PRIMARY CARE PROVIDER:    Albina Billet, MD,  [address]   [place] [address] [phone]  REFERRING PROVIDER:   Albina Billet, MD [address]   [place],   [address] [phone]  RESPONSIBLE PARTY:    Contact Information     Name Relation Home Work Mobile   Ashley [name], Ashley [name] [phone]     Ashley [name],Ashley [name] Daughter [phone]  [phone]   Ashley [name], Ashley [name] Son   [phone]       I met face to face with patient and family in  home. Palliative Care was asked to follow this patient by consultation request of  Albina Billet, MD to address advance care planning and complex medical decision making. This is a follow up visit.                      ASSESSMENT AND PLAN / RECOMMENDATIONS:   Advance Care Planning/Goals of Care: Goals include to maximize quality of life and symptom management. Our advance care planning conversation included a discussion about:    The value and importance of advance care planning  Exploration of personal, cultural or spiritual beliefs that might influence medical decisions  Exploration of goals of care in the event of a sudden injury or illness  Identification  of a healthcare agent - Son Creation of an  advance directive document - MOST with DNR, Limited scope, use of abx, use of iv and limited use of feeding tube. Discussed choices with son and patient in her home. CODE STATUS: DNR I spent 20 minutes providing this consultation. More than 50% of the time in this consultation was spent in counseling and care coordination.  ------------------------------------------------------------------------------------------------------------  Symptom  Management/Plan:  Insomnia: Reports anxiety at hs, often calls son at 1-2 am anxious. Discussed increasing seroquel. Had melatonin in the past but she seems to have had hypotension with it. She may benefit from increase to 37.5 mg at hs, or if sundowning, 12.5 mg around middle of the afternoon.   Care giver burden: Discussed 4 of 6 children who oversee care.  Discussed her anxiety and frequent calls to family. Discussed ALF level care, and family is looking at finances to accommodate her care needs. Discussed [facility] once medicaid is needed, but they will continue as present.  Nutrition: Improved, has gained 10 lbs. Albumin was 4.1. Family is mindful of sodium limitations when able to do so.  Anxiety: May benefit from elavil or buspar. Reports at hs. Discussed modalities for coping.  Was not able to stay on SSRI due to hyponatremia. Na now 142, and has been stable. She endorses not liking to be alone, although caregivers are there most of the day. She has a button to call for help if needed.   Follow up Palliative Care Visit: Palliative care will continue to follow for complex medical decision making, advance care planning, and clarification of goals. Return 8 weeks or prn.  This visit was coded based on medical decision making (MDM).  PPS: 50%  HOSPICE ELIGIBILITY/DIAGNOSIS: TBD  Chief Complaint: anxiety, fall risk  HISTORY OF PRESENT ILLNESS:  BERNARDINA CACHO is a 87 y.o. year old female  with cognitive impairment, fall risk,  COPD.  She endorses increased  but variable anxiety in the context of cognitive decline. Family reports she was not able to tolerate SSRI and has been doing well on seroquel some nights but others she is up and very anxious.   History obtained from review of EMR, discussion with primary team, and interview with family, facility staff/caregiver and/or Ms. Jock.  I reviewed available labs, medications, imaging, studies and related documents from the EMR.  Records reviewed  and summarized above.   ROS  General: NAD EYES: denies vision changes ENMT: denies dysphagia Cardiovascular: denies chest pain, denies DOE Pulmonary: denies cough, denies increased SOB Abdomen: endorses good appetite, denies constipation, endorses continence of bowel, endorses hernia GU: denies dysuria, endorses continence of urine MSK:  denies weakness,  no falls reported Skin: denies rashes or wounds Neurological: denies pain, endorses insomnia Psych: Endorses positive mood, endorses anxiety at hs Heme/lymph/immuno: denies bruises, abnormal bleeding  Physical Exam: Current and past weights: 10 lb gain, 170 lbs Constitutional: NAD General: frail appearing EYES: anicteric sclera, lids intact, no discharge  ENMT: intact hearing, oral mucous membranes moist, dentition intact CV: 1+ LE edema Pulmonary: no increased work of breathing, no cough, room air Abdomen: intake 100%, no ascites MSK: no sarcopenia, moves all extremities, ambulatory with walker Skin: warm and dry, no rashes or wounds on visible skin Neuro:  min generalized weakness,  mild cognitive impairment Psych: anxious affect, A and O x 3 Hem/lymph/immuno: no widespread bruising   Thank you for the opportunity to participate in the care of Ms. Klepacki.  The palliative care team will continue to follow. Please call our office at [phone] if we can be of additional assistance.   Jason Coop, NP   COVID-19 PATIENT SCREENING TOOL Asked and negative response unless otherwise noted:   Have you had symptoms of covid, tested positive or been in contact with someone with symptoms/positive test in the past 5-10 days?

## 2021-09-09 ENCOUNTER — Other Ambulatory Visit: Payer: Self-pay

## 2021-09-09 ENCOUNTER — Other Ambulatory Visit: Payer: Medicare Other | Admitting: Primary Care

## 2021-09-09 DIAGNOSIS — F411 Generalized anxiety disorder: Secondary | ICD-10-CM

## 2021-09-09 DIAGNOSIS — I5032 Chronic diastolic (congestive) heart failure: Secondary | ICD-10-CM

## 2021-09-09 DIAGNOSIS — R600 Localized edema: Secondary | ICD-10-CM | POA: Insufficient documentation

## 2021-09-09 DIAGNOSIS — Z515 Encounter for palliative care: Secondary | ICD-10-CM

## 2021-09-09 NOTE — Progress Notes (Signed)
[facility] Consult Note Telephone: [phone]  Fax: [phone]    Date of encounter: 09/09/21 10:48 AM PATIENT NAME: Ashley [name] [address] [place] [address]   [phone] (home)  DOB: 1931/01/24 MRN: [id number] PRIMARY CARE PROVIDER:    Albina Billet, MD,  [address]   [place] [address] [phone]  REFERRING PROVIDER:   Albina Billet, MD [address]   [place],  [place] [address] [phone]  RESPONSIBLE PARTY:    Contact Information     Name Relation Home Work Mobile   Ashley [name] [phone]     Ashley [name] Daughter [phone]  [phone]   Ashley [name] Son   [phone]        I met face to face with patient and family in  home. Palliative Care was asked to follow this patient by consultation request of  Albina Billet, MD to address advance care planning and complex medical decision making. This is a follow up visit.                                   ASSESSMENT AND PLAN / RECOMMENDATIONS:   Advance Care Planning/Goals of Care: Goals include to maximize quality of life and symptom management. Our advance care planning conversation included a discussion about:    The value and importance of advance care planning  Exploration of personal, cultural or spiritual beliefs that might influence medical decisions  Exploration of goals of care in the event of a sudden injury or illness  CODE STATUS: DNR  I reviewed  a MOST form today. The patient and family has outlined their wishes for the following treatment decisions:  Cardiopulmonary Resuscitation: Do Not Attempt Resuscitation (DNR/No CPR)  Medical Interventions: Limited Additional Interventions: Use medical treatment, IV fluids and cardiac monitoring as indicated, DO NOT USE intubation or mechanical ventilation. May consider use of less invasive airway support such as BiPAP or CPAP. Also provide comfort measures.  Transfer to the hospital if indicated. Avoid intensive care.   Antibiotics: Antibiotics if indicated  IV Fluids: IV fluids if indicated  Feeding Tube: Feeding tube for a defined trial period    Symptom Management/Plan:  Anxiety: Continues to be very impacted by ongoing anxiety. Recommend to address this again, failed sertraline with hyponatremia. Discussed with son Ashley [name] who is reticent to start medications that might have untoward effects.   Insomnia: Awakens 3-4 times a night and gets up often anxious at being alone.  Sleeps during day some in the recliner eg has poor sleep hygiene.  Edema: Has 3+ LE edema to knees and  sob/doe.Endorses low energy.  LCTA. Consulted with cardiology and will send in lasix 20 mg with instructions to take daily for a week. I will do a home  visit to assess edema and weights in a week. No recent labs visible to me, has PCP labs 12/5.   Caregiving: Has new caregiver and other one retired. Discussed having night time care givers if we do any sleep medications.  Discussed needing more care as she ages and what that might look like.  Follow up Palliative Care Visit: Palliative care will continue to follow for complex medical decision making, advance care planning, and clarification of goals. Return 1 weeks or prn.  I spent 60 minutes providing this consultation. More than 50% of the time in this consultation  was spent in counseling and care coordination.  PPS: 50%  HOSPICE ELIGIBILITY/DIAGNOSIS: TBD  Chief Complaint: LE edema, anxiety  HISTORY OF PRESENT ILLNESS:  Ashley [name] is a 87 y.o. year old female  with CHF, fall risk, anxiety, s/p CVA, debility Today has 3+ LE edema. Has been on diuretics in the past but not currently. Endorses fatigue and DOE .   History obtained from review of EMR, discussion with primary team, and interview with family, facility staff/caregiver and/or Ashley [name].  I reviewed available labs, medications, imaging, studies and  related documents from the EMR.  Records reviewed and summarized above.   ROS  General: NAD ENMT: denies dysphagia Cardiovascular: denies chest pain, endorses DOE Pulmonary: occ  cough, endorses  increased SOB Abdomen: endorses good appetite, denies constipation, endorses continence of bowel GU: denies dysuria, endorses continence of urine MSK:  denies increased weakness, no  falls reported, uses walker Skin: denies rashes or wounds Neurological: denies pain, endorses insomnia Psych: Endorses anxious  mood Heme/lymph/immuno: denies bruises, abnormal bleeding  Physical Exam: Current and past weights: reports > 170 lbs, mostly edema gain. To begin daily weights Constitutional: NAD,137/74  - 85 -18 General: frail appearing EYES: anicteric sclera, lids intact, no discharge  ENMT: intact hearing, oral mucous membranes moist, dentition intact CV: S1S2, RRR, 3+ LE edema Pulmonary: LCTA, +increased work of breathing, no cough, + wheezes, room air Abdomen: intake 100%, no ascites MSK: + sarcopenia, moves all extremities, ambulatory with walker  Skin: warm and dry, no rashes or wounds on visible skin Neuro:  + generalized weakness,  mild cognitive impairment/ anxiety Psych: anxious affect, A and O x 3 Hem/lymph/immuno: no widespread bruising  Thank you for the opportunity to participate in the care of Ashley [name].  The palliative care team will continue to follow. Please call our office at [phone] if we can be of additional assistance.   Jason Coop, NP DNP, AGPCNP-BC  COVID-19 PATIENT SCREENING TOOL Asked and negative response unless otherwise noted:   Have you had symptoms of covid, tested positive or been in contact with someone with symptoms/positive test in the past 5-10 days?

## 2021-09-13 IMAGING — CT CT HEAD W/O CM
3 series · 15 of 47 positions shown, 18 images · non-contrast
Comparison: CT head from the same day.

CLINICAL DATA: Head trauma.  6 hour repeat study.

EXAM:
CT HEAD WITHOUT CONTRAST
TECHNIQUE: Contiguous axial images were obtained from the base of the skull
through the vertex without intravenous contrast.

[Series 2: head wo · axial · 0.44mm/px · z∈[-160,-35]mm · 9 of 31 slices shown, 12 images]
[im 3/31  brain]
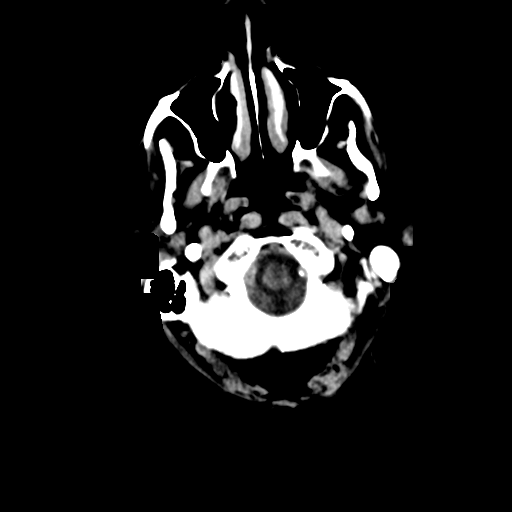
[im 3/31  bone]
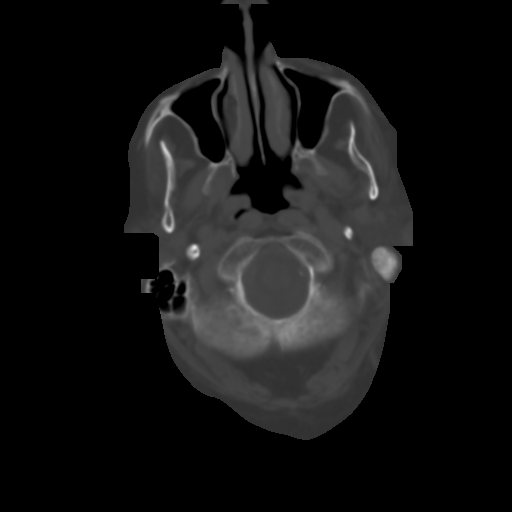
[im 6/31  brain]
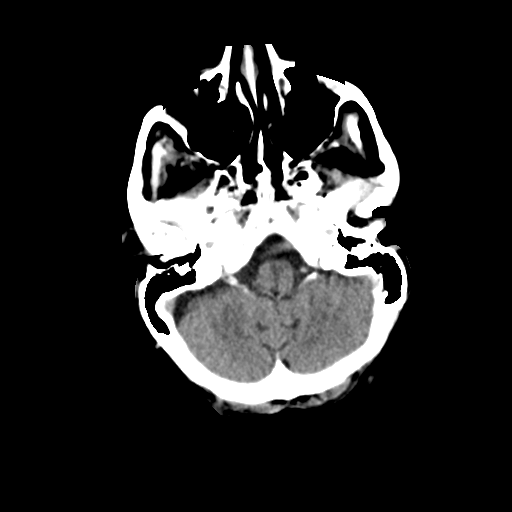
[im 9/31  brain]
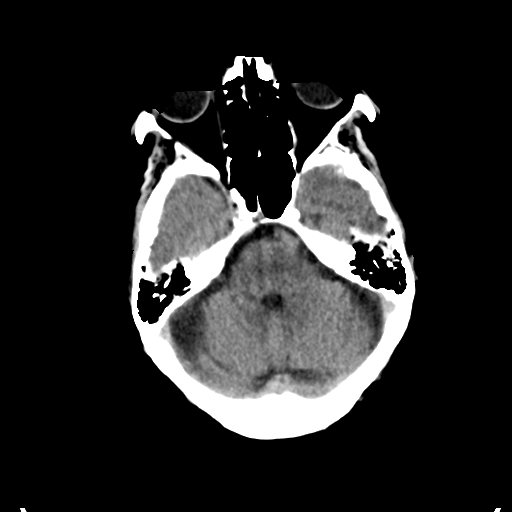
[im 12/31  brain]
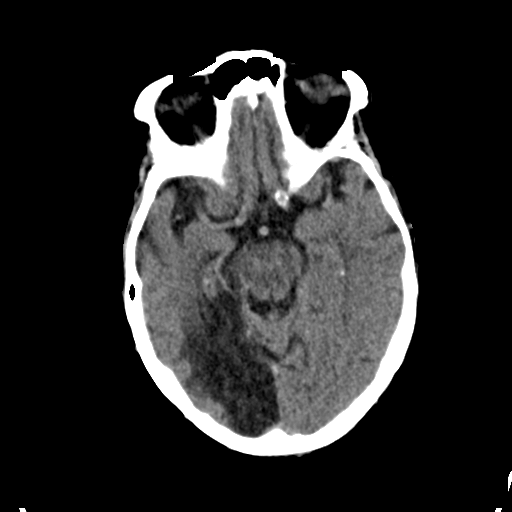
[im 16/31  brain]
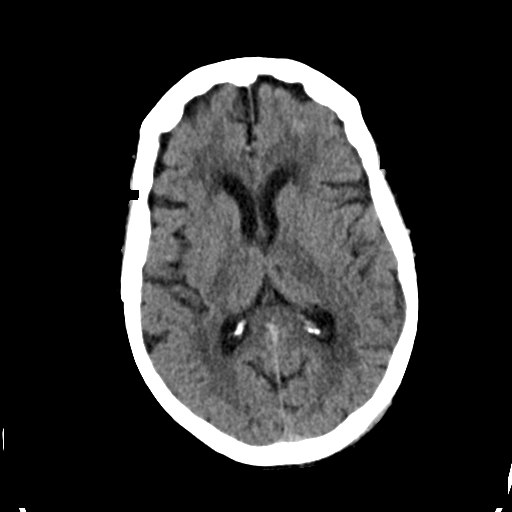
[im 16/31  bone]
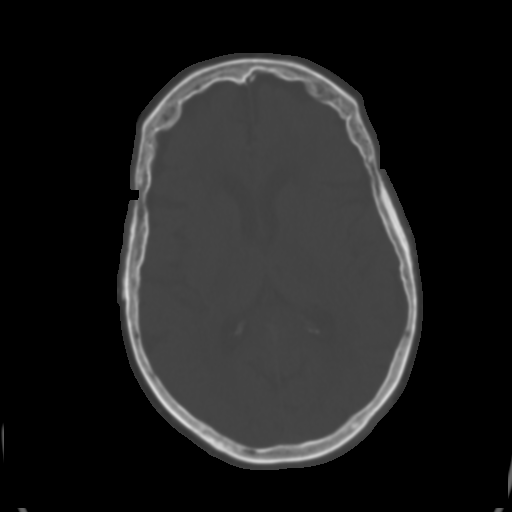
[im 19/31  brain]
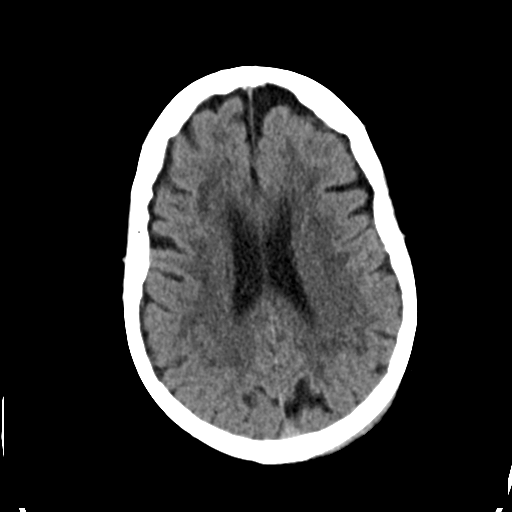
[im 22/31  brain]
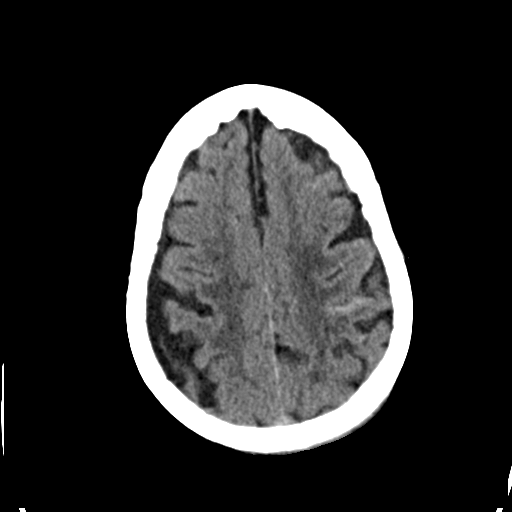
[im 25/31  brain]
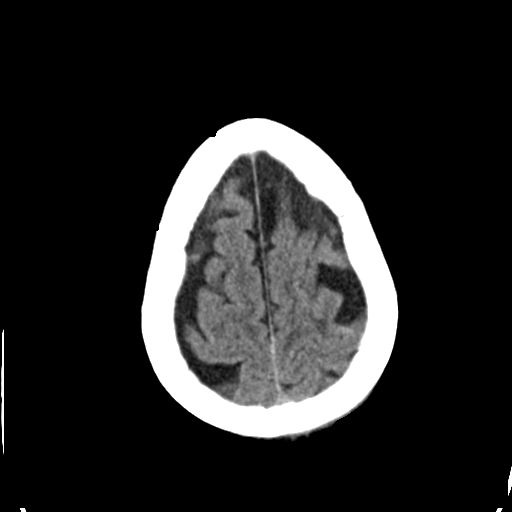
[im 28/31  brain]
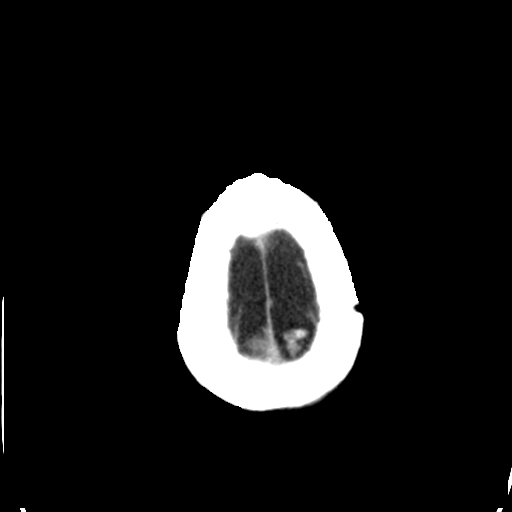
[im 28/31  bone]
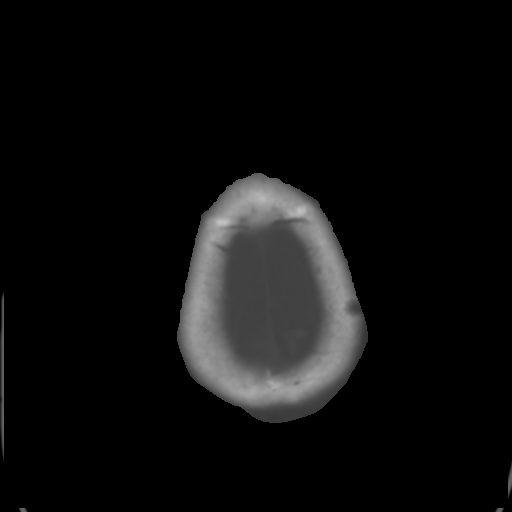

[Series 4: coronal soft tissue · coronal · 0.29mm/px · 3 of 68 slices shown]
[im 24/68  brain]
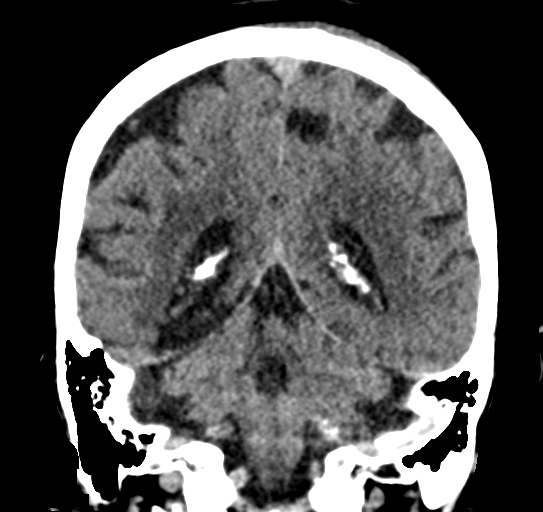
[im 31/68  brain]
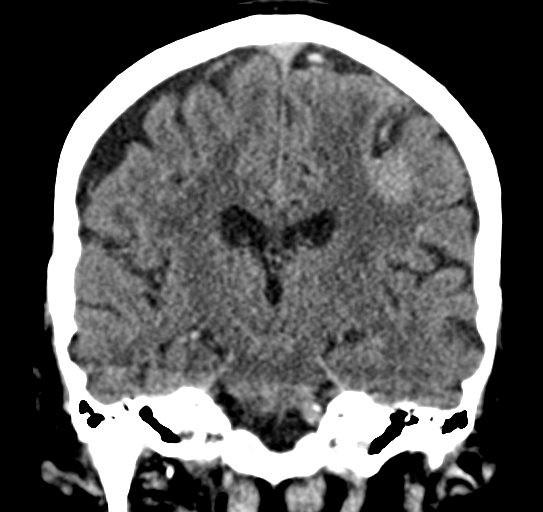
[im 37/68  brain]
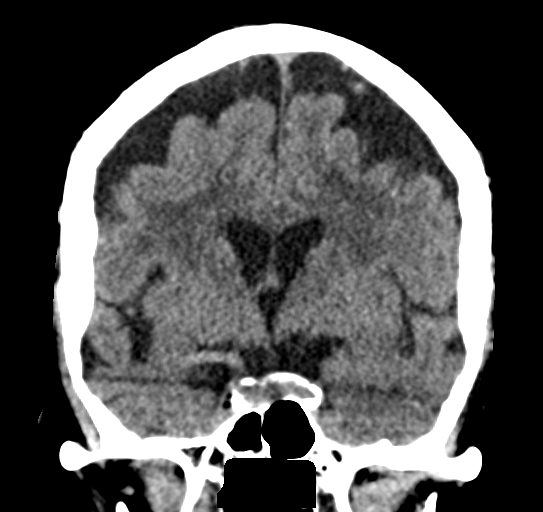

[Series 5: sagittal soft tissue · sagittal · 0.29mm/px · 3 of 53 slices shown]
[im 18/53  brain]
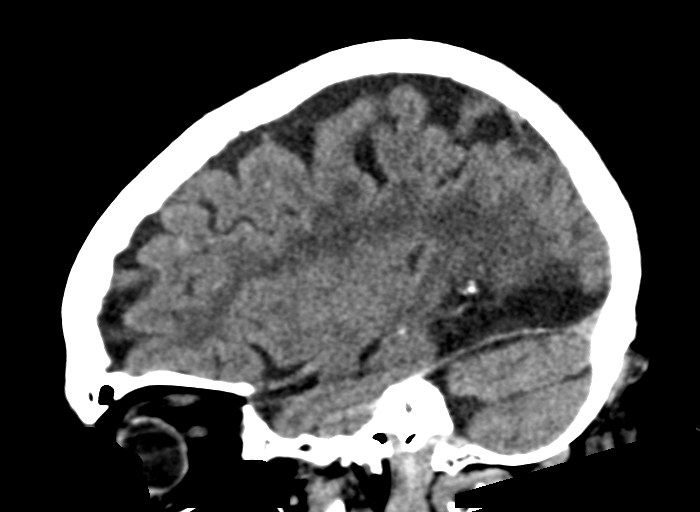
[im 27/53  brain]
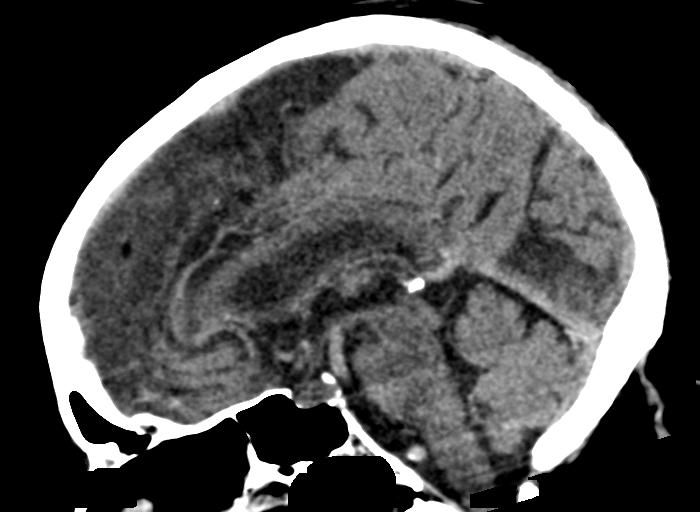
[im 35/53  brain]
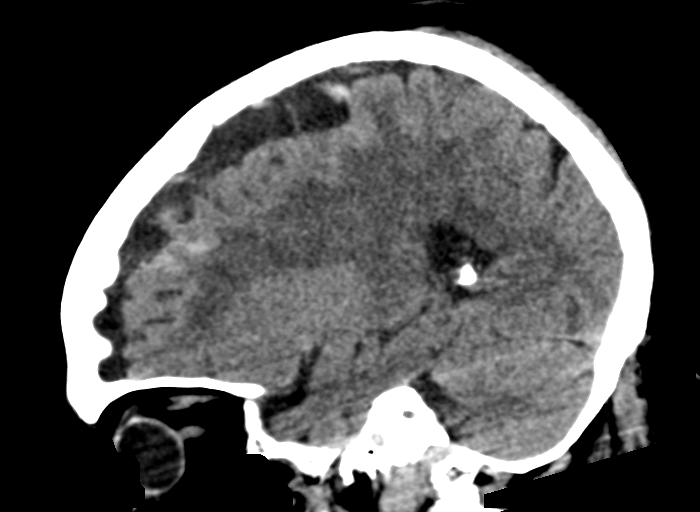

[15 of 47 positions shown; findings below may reference images not displayed]

FINDINGS: Brain: Similar bifrontal subarachnoid hemorrhage. Slight increase in
volume/conspicuity of subarachnoid hemorrhage along the left
posterior frontal convexity (series 2, image 22). Similar possible
small hypodense subdural collections, as described on the prior. No
evidence of acute large vascular territory infarct. Similar remote
right occipital lobe infarct with encephalomalacia. Similar patchy
white matter hypoattenuation, likely related to chronic
microvascular ischemic change. Similar 6 mm calcified mass along the
inner table of the right frontal bone, likely representing a
meningioma without substantial mass effect. Basal cisterns are
patent.

Vascular: Calcific atherosclerosis.

Skull: No acute fracture.  Left posterior scalp contusion.

Sinuses/Orbits: Visualized sinuses are clear.  Unremarkable orbits.

Other: No mastoid effusions.
IMPRESSION: 1. Similar bifrontal subarachnoid hemorrhage. Slight increase in
volume/conspicuity of subarachnoid hemorrhage along the left
posterior frontal convexity, which could represent redistribution of
hemorrhage or a small volume of new hemorrhage. No substantial mass
effect.
2. Similar suspected low density subdural collections, as described
on the prior.
3. Remote right occipital infarct and moderate chronic microvascular
ischemic change.

## 2021-09-13 IMAGING — CT CT CERVICAL SPINE W/O CM
3 of 4 series · 12 of 33 positions shown, 14 images · non-contrast
Comparison: None.

CLINICAL DATA: Neck trauma.  Fall.

EXAM:
CT CERVICAL SPINE WITHOUT CONTRAST
TECHNIQUE: Multidetector CT imaging of the cervical spine was performed without
intravenous contrast. Multiplanar CT image reconstructions were also
generated.

[Series 4: sagittal bone · sagittal · 0.29mm/px · 5 of 77 slices shown, 6 images]
[im 26/77  bone]
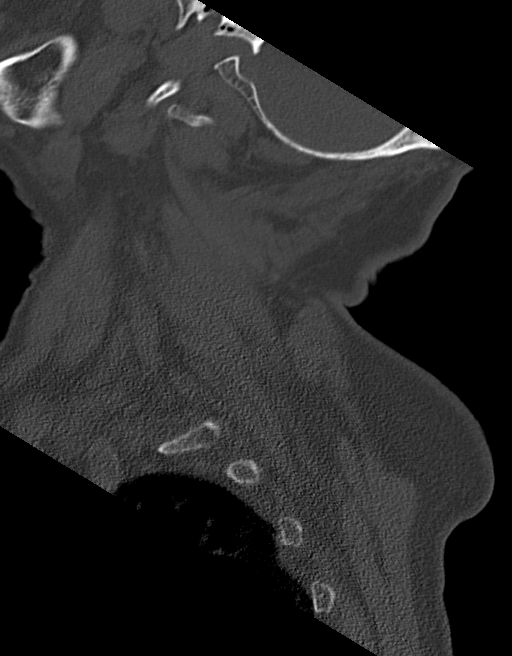
[im 32/77  bone]
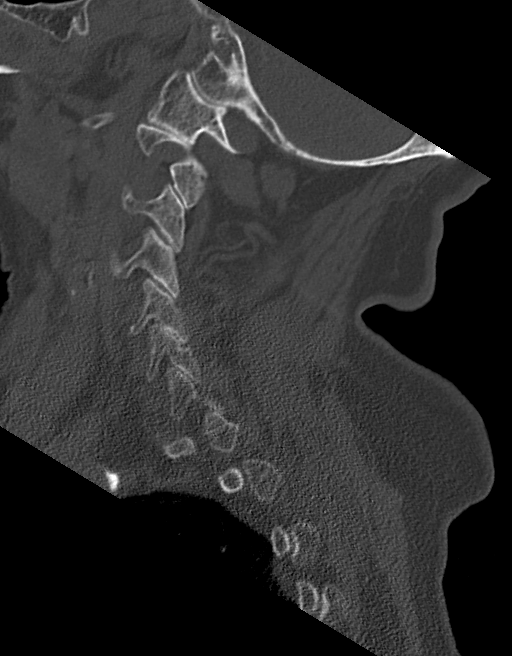
[im 39/77  soft-tissue]
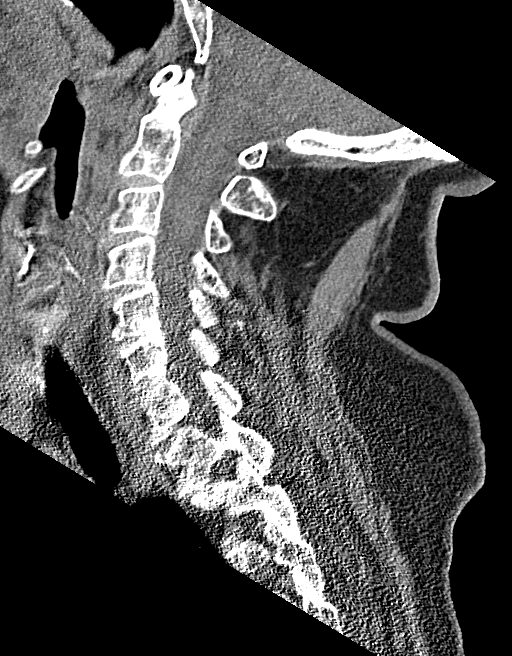
[im 39/77  bone]
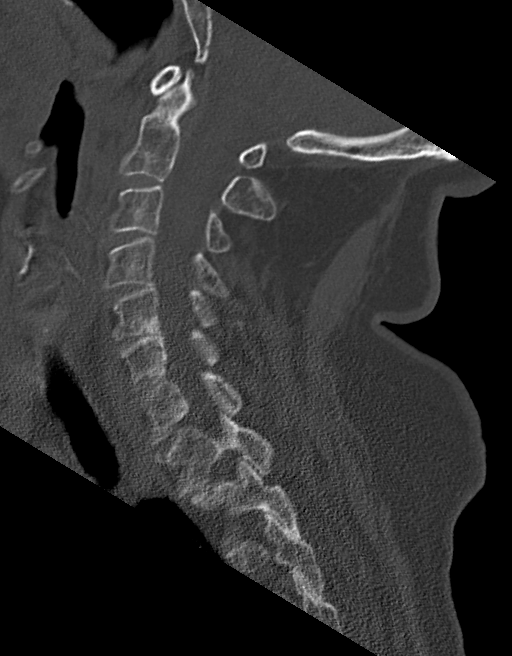
[im 45/77  bone]
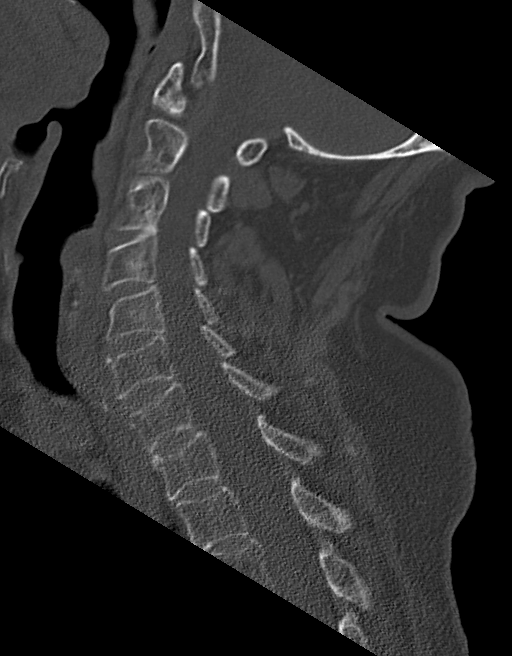
[im 51/77  bone]
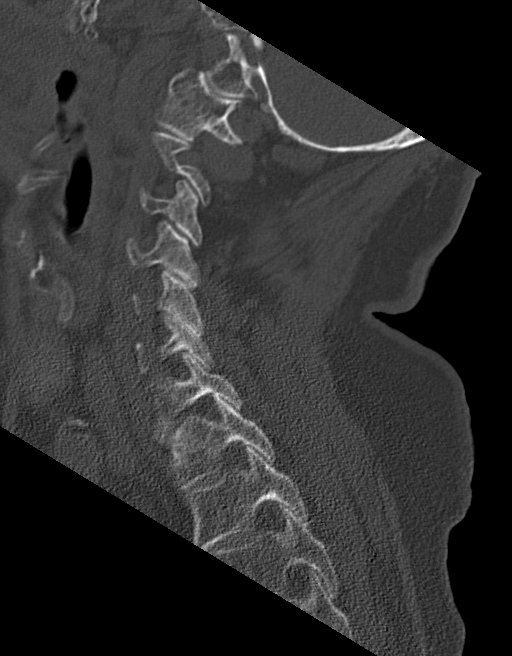

[Series 5: coronal bone · coronal · 0.22mm/px · 3 of 75 slices shown]
[im 25/75  bone]
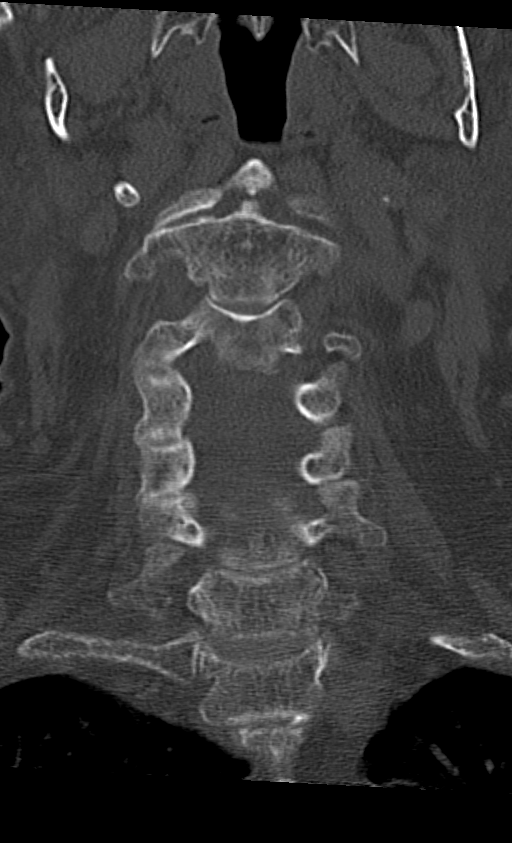
[im 33/75  bone]
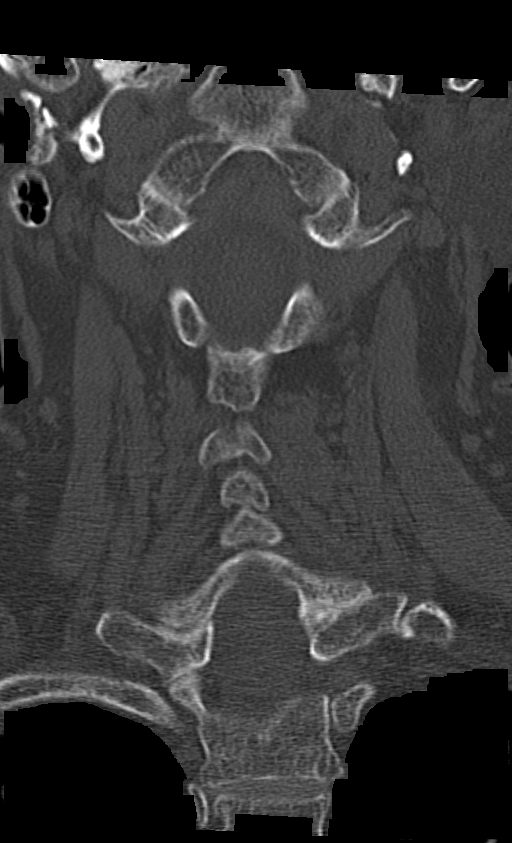
[im 42/75  bone]
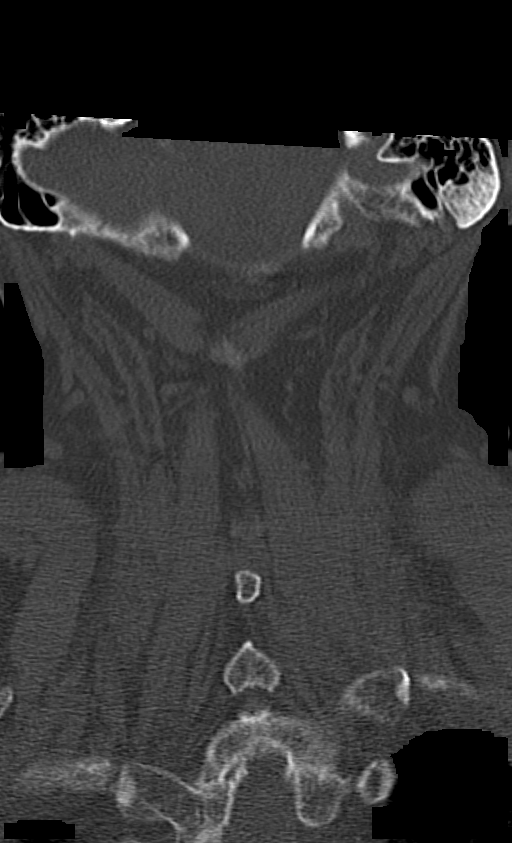

[Series 8: orthogonal bone · axial · 0.28mm/px · z∈[+39,+154]mm · 4 of 95 slices shown, 5 images]
[im 14/95  soft-tissue]
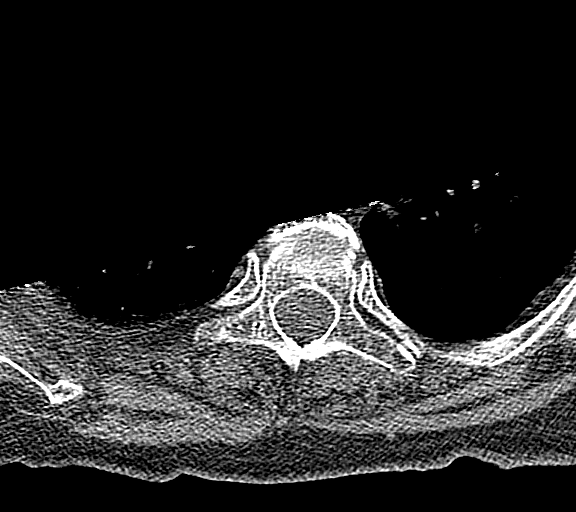
[im 14/95  bone]
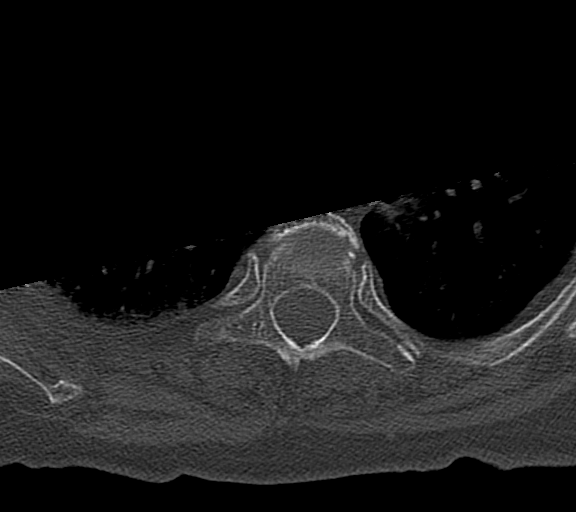
[im 41/95  bone]
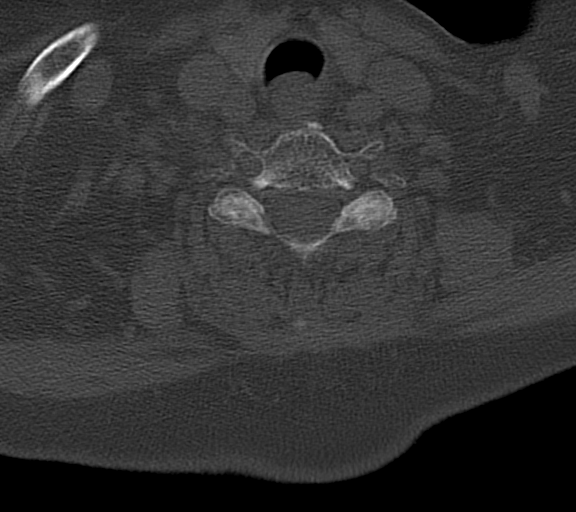
[im 54/95  bone]
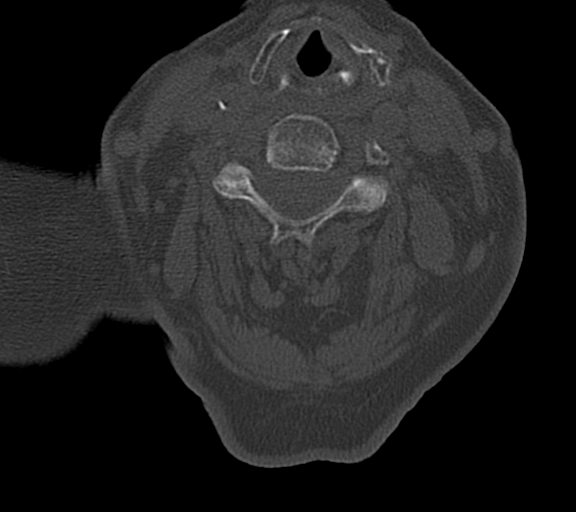
[im 81/95  bone]
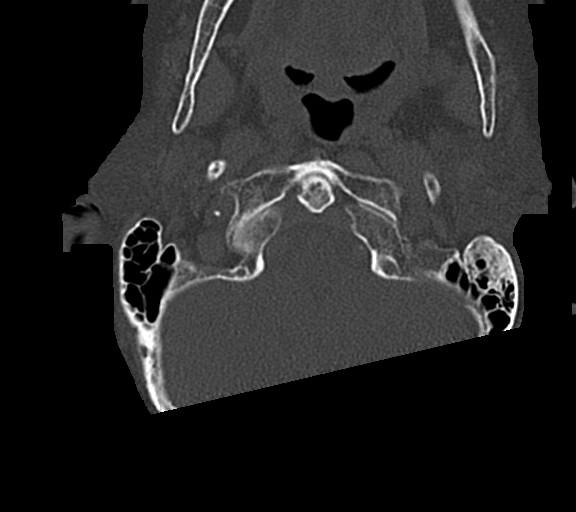

[12 of 33 positions shown; findings below may reference images not displayed]

FINDINGS: Alignment: Within normal limits

Skull base and vertebrae: No acute fracture. No primary bone lesion
or focal pathologic process.

Soft tissues and spinal canal: No prevertebral fluid or swelling. No
visible canal hematoma.

Disc levels: No significant disc height loss. Vertebral body heights
are maintained. Mild degenerative changes seen throughout the
cervical spine.

Upper chest: Negative.

Other: None.
IMPRESSION: No acute abnormality of the cervical or visualized upper thoracic
spine.

## 2021-09-16 ENCOUNTER — Other Ambulatory Visit: Payer: Self-pay

## 2021-09-16 ENCOUNTER — Other Ambulatory Visit: Payer: Medicare Other | Admitting: Primary Care

## 2021-09-16 DIAGNOSIS — I5032 Chronic diastolic (congestive) heart failure: Secondary | ICD-10-CM

## 2021-09-16 DIAGNOSIS — F411 Generalized anxiety disorder: Secondary | ICD-10-CM

## 2021-09-16 DIAGNOSIS — R600 Localized edema: Secondary | ICD-10-CM

## 2021-09-16 DIAGNOSIS — Z515 Encounter for palliative care: Secondary | ICD-10-CM

## 2021-09-16 NOTE — Progress Notes (Signed)
[facility] Consult Note Telephone: [phone]  Fax: [phone]    Date of encounter: 09/16/21 7:00 PM PATIENT NAME: Ashley [name] 87 [address] [place] [address]   [phone] (home)  DOB: [birth year] MRN: [id number] PRIMARY CARE PROVIDER:    Albina Billet, MD,  [address]   [place] [address] [phone]  REFERRING PROVIDER:   Albina Billet, MD [address]   [place],  [place] [address] [phone]  RESPONSIBLE PARTY:    Contact Information     Name Relation Home Work Mobile   Jessi, Pitstick [phone]     Steele,Cheryl Daughter [phone]  [phone]   Infantof, Villagomez [name] Son   [phone]       I met face to face with patient and family in  home/facility. Palliative Care was asked to follow this patient by consultation request of  Albina Billet, MD to address advance care planning and complex medical decision making. This is a follow up visit.                                   ASSESSMENT AND PLAN / RECOMMENDATIONS:   Advance Care Planning/Goals of Care: Goals include to maximize quality of life and symptom management.   Symptom Management/Plan:  Patient has lost 3 lbs and had decrease in LE edema after taking lasix 20 mg x 5 days. She would benefit from ongoing diuretic management. She endorses no increased dizziness and feels her breathing, which had been short, is improved.   We discussed ongoing diuretic management if cardiology feels this is wise and I defer to their office for intervention. I would suggest a small maintenance dose so she keeps a lower level of edema and SOB. Lungs today were clear with a few fine rales, and LE edema was R 1-2+ and none on L.  She should continue to weigh daily . She has f/u with pcp for labs and visit in 2 weeks.  Follow up Palliative Care Visit: Palliative care will continue to follow for complex medical decision making, advance care planning,  and clarification of goals. Return 4 weeks or prn.  I spent 40 minutes providing this consultation. More than 50% of the time in this consultation was spent in counseling and care coordination.  PPS: 40%  HOSPICE ELIGIBILITY/DIAGNOSIS: TBD  Chief Complaint: sob  HISTORY OF PRESENT ILLNESS:  JI [name] is a 87 y.o. year old female  with h/o cva, memory loss, CHF, recent increase in edema, SOB and weight. .   History obtained from review of EMR, discussion with primary team, and interview with family, facility staff/caregiver and/or Ashley [name].  I reviewed available labs, medications, imaging, studies and related documents from the EMR.  Records reviewed and summarized above.   ROS  General: NAD EYES: denies vision changes ENMT: denies dysphagia Cardiovascular: denies chest pain, endorses DOE Pulmonary: denies cough, denies increased SOB Abdomen: endorses good appetite, denies constipation, endorses continence of bowel GU: denies dysuria, endorses continence of urine MSK:  denies weakness,  no falls reported Skin: denies rashes or wounds Neurological: denies pain, denies insomnia Psych: Endorses positive mood, anxious at times Heme/lymph/immuno: denies bruises, abnormal bleeding  Physical Exam: Current and past weights: 182 lbs, down from 185 lbs after lasix Constitutional: NAD General: frail appearing, obese EYES: anicteric sclera, lids intact, no discharge  ENMT: intact hearing,  oral mucous membranes moist CV: S1S2, RRR, 1-2+ LE edema Pulmonary: lungs with fine rales in bases, mild  increased work of breathing, no cough, room air Abdomen: intake 100%, normo-active BS + 4 quadrants, soft and non tender, no ascites GU: deferred MSK:+ sarcopenia, moves all extremities, ambulatory with walker Skin: warm and dry, no rashes or wounds on visible skin Neuro:  mild  generalized weakness,  mild cognitive impairment Psych: anxious affect, A and O x 3 Hem/lymph/immuno: no  widespread bruising   Thank you for the opportunity to participate in the care of Ashley [name].  The palliative care team will continue to follow. Please call our office at [phone] if we can be of additional assistance.   Jason Coop, NP DNP, AGPCNP-BC  COVID-19 PATIENT SCREENING TOOL Asked and negative response unless otherwise noted:   Have you had symptoms of covid, tested positive or been in contact with someone with symptoms/positive test in the past 5-10 days?

## 2021-11-18 ENCOUNTER — Other Ambulatory Visit: Payer: Self-pay

## 2021-11-18 ENCOUNTER — Other Ambulatory Visit: Payer: Medicare Other | Admitting: Primary Care

## 2021-11-18 DIAGNOSIS — E871 Hypo-osmolality and hyponatremia: Secondary | ICD-10-CM

## 2021-11-18 DIAGNOSIS — W19XXXS Unspecified fall, sequela: Secondary | ICD-10-CM

## 2021-11-18 DIAGNOSIS — Z515 Encounter for palliative care: Secondary | ICD-10-CM

## 2021-11-18 DIAGNOSIS — I5032 Chronic diastolic (congestive) heart failure: Secondary | ICD-10-CM

## 2021-11-18 NOTE — Progress Notes (Signed)
[facility] Consult Note Telephone: [phone]  Fax: [phone]    Date of encounter: 11/18/21 11:27 AM PATIENT NAME: Ashley [name] [address] [place] [address]   [phone] (home)  DOB: 1931/01/13 MRN: [id number] PRIMARY CARE PROVIDER:    Albina Billet, MD,  [address]   [place] [address] [phone]  REFERRING PROVIDER:   Albina Billet, MD [address]   [place],  [place] [address] [phone]  RESPONSIBLE PARTY:    Contact Information     Name Relation Home Work Mobile   Sible, Straley [phone]     Steele,Cheryl Daughter [phone]  [phone]   Tenelle, Andreason Son   [phone]        I met face to face with patient and family in  home. Palliative Care was asked to follow this patient by consultation request of  Albina Billet, MD to address advance care planning and complex medical decision making. This is a follow up visit.                                   ASSESSMENT AND PLAN / RECOMMENDATIONS:   Advance Care Planning/Goals of Care: Goals include to maximize quality of life and symptom management. Patient/health care surrogate gave his/her permission to discuss.Our advance care planning conversation included a discussion about:    The value and importance of advance care planning  Experiences with loved ones who have been seriously ill or have died  Exploration of personal, cultural or spiritual beliefs that might influence medical decisions  Exploration of goals of care in the event of a sudden injury or illness  Identification of a healthcare agent - sons Review of an  advance directive document  CODE STATUS: DNR  Symptom Management/Plan:  Intermittent  Insomnia: Pay be related to being alone at hs, she reports anxiety.Recommend 3 mg melatonin nightly.  Mobility: has a new lift chair, more comfortable. Using walker, denies falls.  Anxiety: Endorses related to  cognitive impairment. Endorses memory loss s/p cva and the frustration . I suggested music therapy from younger days which has been shown to  help improve speech.  Nutrition: Getting MOW. Able to do some light prep if needed. Appetite is good.  Edema; Has stopped daily weights, I asked her to re initiate. She has slight pitting le edema and clear lungs today. Current diuretic seems to be keeping edema controlled. PCP has started 20 mg lasix 3 times/ week prn. This seems to be treating her issues.  Follow up Palliative Care Visit: Palliative care will continue to follow for complex medical decision making, advance care planning, and clarification of goals. Return 8 weeks or prn.  This visit was coded based on medical decision making (MDM).  PPS: 50%  HOSPICE ELIGIBILITY/DIAGNOSIS: TBD  Chief Complaint: chf, anxiety, insomnia  HISTORY OF PRESENT ILLNESS:  DABNEY DEVER is a 88 y.o. year old female  with h/o chf, cva, anxiety. Reports longstanding anxiety and insomnia .   History obtained from review of EMR, discussion with primary team, and interview with family, facility staff/caregiver and/or Ms. Bartolo.  I reviewed available labs, medications, imaging, studies and related documents from the EMR.  Records reviewed and summarized above. I reviewed recent notes from PCP.  ROS   General: NAD ENMT: denies dysphagia Cardiovascular: denies chest pain, denies DOE Pulmonary: denies cough,  denies increased SOB Abdomen: endorses good appetite, denies constipation, endorses continence of bowel GU: denies dysuria, endorses continence of urine MSK:  denies  increased weakness,  no falls reported Skin: denies rashes or wounds Neurological: denies pain, endorse insomnia Psych: Endorses positive mood, endorses hs anxiety Heme/lymph/immuno: denies bruises, abnormal bleeding  Physical Exam: Current and past weights: will begin to weigh daily Constitutional: NAD General: frail appearing EYES:  anicteric sclera, lids intact, no discharge  ENMT: hard of  hearing, oral mucous membranes moist CV: S1S2, RRR, slight bil  LE edema Pulmonary: LCTA, no increased work of breathing, no cough, room air Abdomen: intake 75%, no ascites GU: deferred MSK: + sarcopenia, moves all extremities, ambulatory with walker Skin: warm and dry, no rashes or wounds on visible skin Neuro:  + generalized weakness,  moderate cognitive impairment Psych: anxious affect, A and O x 2-3 Hem/lymph/immuno: no widespread bruising   Thank you for the opportunity to participate in the care of Ashley [name].  The palliative care team will continue to follow. Please call our office at [phone] if we can be of additional assistance.   Jason Coop, NP DNP, AGPCNP-BC  COVID-19 PATIENT SCREENING TOOL Asked and negative response unless otherwise noted:   Have you had symptoms of covid, tested positive or been in contact with someone with symptoms/positive test in the past 5-10 days?

## 2021-11-18 NOTE — Telephone Encounter (Signed)
na

## 2022-02-11 ENCOUNTER — Telehealth: Payer: Self-pay

## 2022-02-11 NOTE — Telephone Encounter (Signed)
850 am.  Phone call made to patient and she requested I speak with her caregiver.  Follow up visit scheduled form 5/18 @ 9 am.  ?

## 2022-03-12 ENCOUNTER — Other Ambulatory Visit: Payer: Medicare Other | Admitting: Primary Care

## 2022-03-12 VITALS — BP 120/86 | HR 93 | Temp 97.4°F | Wt 197.4 lb

## 2022-03-12 DIAGNOSIS — J449 Chronic obstructive pulmonary disease, unspecified: Secondary | ICD-10-CM

## 2022-03-12 DIAGNOSIS — R600 Localized edema: Secondary | ICD-10-CM

## 2022-03-12 DIAGNOSIS — I5032 Chronic diastolic (congestive) heart failure: Secondary | ICD-10-CM

## 2022-03-12 DIAGNOSIS — Z515 Encounter for palliative care: Secondary | ICD-10-CM

## 2022-03-12 NOTE — Progress Notes (Signed)
[facility] Consult Note Telephone: [phone]  Fax: [phone]    Date of encounter: 03/12/22 9:31 AM PATIENT NAME: Ashley [name] [address] [place] [address]   [phone] (home)  DOB: September 23, 1931 MRN: [id number] PRIMARY CARE PROVIDER:    Albina Billet, MD,  [address]   [place] [address] [phone]  REFERRING PROVIDER:   Albina Billet, MD [address]   [place],  [place] [address] [phone]  RESPONSIBLE PARTY:    Contact Information     Name Relation Home Work Mobile   Ashley [name], Jepsen [phone]     Ashley [name],Cheryl Daughter [phone]  [phone]   [name], Ashley Son   [phone]      I connected with  Ashley [name] on 03/12/22 by a video enabled telemedicine application and verified that I am speaking with the correct person using two identifiers.   I discussed the limitations of evaluation and management by telemedicine. The patient expressed understanding and agreed to proceed.   I met face to face with patient and family in the home connecting virtually with Ashley Gather, RN.   Palliative Care was asked to follow this patient by consultation request of  Ashley Billet, MD to address advance care planning and complex medical decision making. This is a follow up visit.                                   ASSESSMENT AND PLAN / RECOMMENDATIONS:   Advance Care Planning/Goals of Care: Goals include to maximize quality of life and symptom management. Patient/health care surrogate gave his/her permission to discuss. Our advance care planning conversation included a discussion about:    The value and importance of advance care planning.  Identification of a healthcare agent-Ashley [name] Decision not to resuscitate already in place.  CODE STATUS:  DNR  Symptom Management/Plan:  Edema:  Daily weights are being completed by aides and documented on a calendar.   2+ bilateral  pitting edema present mid-calf.  Weights have been trending upward this month.  See below.  Spoke with son Ashley [name] who manages medications.  He confirms Lasix 20 mg is being administered daily. Sleeping in a lift, recliner chair for at least 2 years now.  Has a hospital but patient feels she can't get out of the bed quick enough.  Discussed notifying MD of weight gain 3 lbs in 24 hours or 5 lbs in 1 week.  Patient will see PCP on Monday and take weight calendar for review. May need more lasix or switch to torsemide.  02/26/22 Weight 193.2 lbs 03/09/22 Weight 199.4 lbs 03/11/22 Weight 199.2 lbs 03/12/22 Weight 197.4 lbs  Intermittent Insomnia:  Having some issues with nightmares.  No melatonin is being used.  Family was concerned about reaction given cognitive impairment.   Nutrition: No longer receiving MOW.  This stopped about 3-4 weeks ago but uncertain the reason why.  I have sent a request to [place], SW to follow up.  Currently caregivers are assisting with meal preparations.  Skin:  Yeast infection present under bilateral breast about 1 week ago. Left side healed and right side much improved.  Treatment of Lotrisone and miconazole powder is being applied daily.  Instructed to continue for an additional 2 weeks.  Interdry product recommended and son Ashley [name] aware. Have been using cotton undershirt which will  not wick.  Phone call made to Dr. Juanell Fairly office to request medication list and last office visit note.  Patient has not been seen in over 3 months.  Staff will fax notes and med list over after Monday's visit.    Follow up Palliative Care Visit: Palliative care will continue to follow for complex medical decision making, advance care planning, and clarification of goals. Return 6-8 weeks or prn.  This visit was coded based on medical decision making (MDM).  PPS: 50%  HOSPICE ELIGIBILITY/DIAGNOSIS: no  Chief Complaint: DOE, dementia  HISTORY OF PRESENT ILLNESS:  Ashley [name] is a 88  y.o. year old female  with h/o chf, cva, anxiety.Marland Kitchen edema  Reports longstanding anxiety and insomnia .Patient seen today to review palliative care needs to include medical decision making and advance care planning as appropriate.   History obtained from review of EMR, discussion with primary team, and interview with family, facility staff/caregiver and/or Ashley [name].  I reviewed available labs, medications, imaging, studies and related documents from the EMR.  Records reviewed and summarized above.   ROS  General: NAD EYES: denies vision changes ENMT: denies dysphagia Cardiovascular: denies chest pain, + DOE Pulmonary: + cough, denies increased SOB Abdomen: endorses good appetite, denies constipation, endorses continence of bowel GU: denies dysuria, endorses continence of urine (stress incontinence) MSK:  denies increased weakness,  no falls reported Skin: denies rashes or wounds Neurological: denies pain, denies insomnia Psych: Endorses positive mood Heme/lymph/immuno: denies bruises, abnormal bleeding  Physical Exam: Current and past weights:  See above Constitutional: NAD EYES: anicteric sclera, lids intact, no discharge  ENMT: intact hearing, oral mucous membranes moist, dentition intact CV: S1S2, RRR, bil 2+ pre tibial  LE edema Pulmonary: LCTA, + increased work of breathing, + cough, room air Abdomen: intake 100%, normo-active BS + 4 quadrants, soft and non tender, no ascites GU: deferred MSK: mild sarcopenia, moves all extremities, ambulatory-with rolling walker Skin: warm and dry, no rashes or wounds on visible skin Neuro:  baseline  generalized weakness,  + cognitive impairment Psych: non-anxious affect, A and O x 2 Hem/lymph/immuno: no widespread bruising   Thank you for the opportunity to participate in the care of Ashley [name].  The palliative care team will continue to follow. Please call our office at [phone] if we can be of additional assistance.   Ashley Burton, RN  Ashley Coop DNP, MPH, AGPCNP-BC, [facility]   COVID-19 PATIENT SCREENING TOOL Asked and negative response unless otherwise noted:   Have you had symptoms of covid, tested positive or been in contact with someone with symptoms/positive test in the past 5-10 days?

## 2022-03-12 NOTE — Progress Notes (Signed)
[facility] and received the following feedback about services for patient from Lincoln Maxin  "I spoke with her son.  She was going to the senior center congregate meal site before [place].  The meal sites opened a couple of weeks ago.  While they were closed, they were sending out boxes of frozen meals.  Since Ashley [name] isn't able to go to the site anymore, I put her on our waiting list.  This past week a space opened up and I am scheduled to see her on Tuesday, May 23rd at approximately 10:30am."

## 2022-03-31 ENCOUNTER — Other Ambulatory Visit: Payer: Self-pay

## 2022-03-31 ENCOUNTER — Emergency Department: Payer: Medicare Other

## 2022-03-31 ENCOUNTER — Inpatient Hospital Stay
Admit: 2022-03-31 | Discharge: 2022-03-31 | Disposition: A | Discharge status: Home / Self Care | Payer: Medicare Other | Attending: Internal Medicine | Admitting: Internal Medicine

## 2022-03-31 ENCOUNTER — Inpatient Hospital Stay
Admission: EM | Admit: 2022-03-31 | Discharge: 2022-04-25 | DRG: 871 | Disposition: E | Payer: Medicare Other | Attending: Internal Medicine | Admitting: Internal Medicine

## 2022-03-31 ENCOUNTER — Inpatient Hospital Stay: Payer: Medicare Other

## 2022-03-31 DIAGNOSIS — G9341 Metabolic encephalopathy: Secondary | ICD-10-CM | POA: Diagnosis present

## 2022-03-31 DIAGNOSIS — F32A Depression, unspecified: Secondary | ICD-10-CM | POA: Diagnosis present

## 2022-03-31 DIAGNOSIS — Z955 Presence of coronary angioplasty implant and graft: Secondary | ICD-10-CM

## 2022-03-31 DIAGNOSIS — I2510 Atherosclerotic heart disease of native coronary artery without angina pectoris: Secondary | ICD-10-CM | POA: Diagnosis present

## 2022-03-31 DIAGNOSIS — N179 Acute kidney failure, unspecified: Secondary | ICD-10-CM | POA: Diagnosis present

## 2022-03-31 DIAGNOSIS — J155 Pneumonia due to Escherichia coli: Secondary | ICD-10-CM | POA: Diagnosis present

## 2022-03-31 DIAGNOSIS — Z79899 Other long term (current) drug therapy: Secondary | ICD-10-CM

## 2022-03-31 DIAGNOSIS — Z8719 Personal history of other diseases of the digestive system: Secondary | ICD-10-CM

## 2022-03-31 DIAGNOSIS — A419 Sepsis, unspecified organism: Secondary | ICD-10-CM

## 2022-03-31 DIAGNOSIS — R6520 Severe sepsis without septic shock: Secondary | ICD-10-CM | POA: Diagnosis present

## 2022-03-31 DIAGNOSIS — Z90710 Acquired absence of both cervix and uterus: Secondary | ICD-10-CM

## 2022-03-31 DIAGNOSIS — K219 Gastro-esophageal reflux disease without esophagitis: Secondary | ICD-10-CM | POA: Diagnosis present

## 2022-03-31 DIAGNOSIS — Z823 Family history of stroke: Secondary | ICD-10-CM

## 2022-03-31 DIAGNOSIS — I5032 Chronic diastolic (congestive) heart failure: Secondary | ICD-10-CM | POA: Diagnosis not present

## 2022-03-31 DIAGNOSIS — I714 Abdominal aortic aneurysm, without rupture, unspecified: Secondary | ICD-10-CM | POA: Diagnosis present

## 2022-03-31 DIAGNOSIS — J9621 Acute and chronic respiratory failure with hypoxia: Secondary | ICD-10-CM | POA: Diagnosis present

## 2022-03-31 DIAGNOSIS — W19XXXA Unspecified fall, initial encounter: Principal | ICD-10-CM

## 2022-03-31 DIAGNOSIS — I10 Essential (primary) hypertension: Secondary | ICD-10-CM | POA: Diagnosis present

## 2022-03-31 DIAGNOSIS — J189 Pneumonia, unspecified organism: Secondary | ICD-10-CM | POA: Diagnosis not present

## 2022-03-31 DIAGNOSIS — Z961 Presence of intraocular lens: Secondary | ICD-10-CM | POA: Diagnosis present

## 2022-03-31 DIAGNOSIS — Z87891 Personal history of nicotine dependence: Secondary | ICD-10-CM

## 2022-03-31 DIAGNOSIS — F418 Other specified anxiety disorders: Secondary | ICD-10-CM | POA: Diagnosis not present

## 2022-03-31 DIAGNOSIS — R600 Localized edema: Secondary | ICD-10-CM | POA: Diagnosis present

## 2022-03-31 DIAGNOSIS — J439 Emphysema, unspecified: Secondary | ICD-10-CM | POA: Diagnosis present

## 2022-03-31 DIAGNOSIS — I5033 Acute on chronic diastolic (congestive) heart failure: Secondary | ICD-10-CM | POA: Diagnosis present

## 2022-03-31 DIAGNOSIS — Z66 Do not resuscitate: Secondary | ICD-10-CM | POA: Diagnosis present

## 2022-03-31 DIAGNOSIS — F419 Anxiety disorder, unspecified: Secondary | ICD-10-CM | POA: Diagnosis present

## 2022-03-31 DIAGNOSIS — I248 Other forms of acute ischemic heart disease: Secondary | ICD-10-CM | POA: Diagnosis present

## 2022-03-31 DIAGNOSIS — E785 Hyperlipidemia, unspecified: Secondary | ICD-10-CM | POA: Diagnosis present

## 2022-03-31 DIAGNOSIS — J441 Chronic obstructive pulmonary disease with (acute) exacerbation: Secondary | ICD-10-CM

## 2022-03-31 DIAGNOSIS — I130 Hypertensive heart and chronic kidney disease with heart failure and stage 1 through stage 4 chronic kidney disease, or unspecified chronic kidney disease: Secondary | ICD-10-CM | POA: Diagnosis present

## 2022-03-31 DIAGNOSIS — N1831 Chronic kidney disease, stage 3a: Secondary | ICD-10-CM | POA: Diagnosis present

## 2022-03-31 DIAGNOSIS — Z96641 Presence of right artificial hip joint: Secondary | ICD-10-CM | POA: Diagnosis present

## 2022-03-31 DIAGNOSIS — M1990 Unspecified osteoarthritis, unspecified site: Secondary | ICD-10-CM | POA: Diagnosis present

## 2022-03-31 DIAGNOSIS — F0390 Unspecified dementia without behavioral disturbance: Secondary | ICD-10-CM | POA: Diagnosis present

## 2022-03-31 DIAGNOSIS — R0602 Shortness of breath: Secondary | ICD-10-CM | POA: Diagnosis present

## 2022-03-31 DIAGNOSIS — I252 Old myocardial infarction: Secondary | ICD-10-CM

## 2022-03-31 DIAGNOSIS — Z6836 Body mass index (BMI) 36.0-36.9, adult: Secondary | ICD-10-CM

## 2022-03-31 DIAGNOSIS — Z20822 Contact with and (suspected) exposure to covid-19: Secondary | ICD-10-CM | POA: Diagnosis present

## 2022-03-31 DIAGNOSIS — Z515 Encounter for palliative care: Secondary | ICD-10-CM | POA: Diagnosis not present

## 2022-03-31 DIAGNOSIS — I639 Cerebral infarction, unspecified: Secondary | ICD-10-CM | POA: Diagnosis not present

## 2022-03-31 DIAGNOSIS — Z8673 Personal history of transient ischemic attack (TIA), and cerebral infarction without residual deficits: Secondary | ICD-10-CM | POA: Diagnosis not present

## 2022-03-31 DIAGNOSIS — Z825 Family history of asthma and other chronic lower respiratory diseases: Secondary | ICD-10-CM

## 2022-03-31 DIAGNOSIS — E669 Obesity, unspecified: Secondary | ICD-10-CM | POA: Diagnosis present

## 2022-03-31 DIAGNOSIS — A4151 Sepsis due to Escherichia coli [E. coli]: Principal | ICD-10-CM | POA: Diagnosis present

## 2022-03-31 DIAGNOSIS — I7143 Infrarenal abdominal aortic aneurysm, without rupture: Secondary | ICD-10-CM | POA: Diagnosis present

## 2022-03-31 DIAGNOSIS — J188 Other pneumonia, unspecified organism: Secondary | ICD-10-CM | POA: Diagnosis present

## 2022-03-31 DIAGNOSIS — Z9842 Cataract extraction status, left eye: Secondary | ICD-10-CM

## 2022-03-31 DIAGNOSIS — M545 Low back pain, unspecified: Secondary | ICD-10-CM | POA: Diagnosis not present

## 2022-03-31 DIAGNOSIS — Z9841 Cataract extraction status, right eye: Secondary | ICD-10-CM

## 2022-03-31 DIAGNOSIS — F05 Delirium due to known physiological condition: Secondary | ICD-10-CM | POA: Diagnosis present

## 2022-03-31 DIAGNOSIS — J9601 Acute respiratory failure with hypoxia: Secondary | ICD-10-CM | POA: Diagnosis present

## 2022-03-31 DIAGNOSIS — R778 Other specified abnormalities of plasma proteins: Secondary | ICD-10-CM | POA: Diagnosis not present

## 2022-03-31 DIAGNOSIS — Z7982 Long term (current) use of aspirin: Secondary | ICD-10-CM

## 2022-03-31 DIAGNOSIS — Z8249 Family history of ischemic heart disease and other diseases of the circulatory system: Secondary | ICD-10-CM

## 2022-03-31 DIAGNOSIS — Z9049 Acquired absence of other specified parts of digestive tract: Secondary | ICD-10-CM

## 2022-03-31 DIAGNOSIS — Z8679 Personal history of other diseases of the circulatory system: Secondary | ICD-10-CM

## 2022-03-31 DIAGNOSIS — I739 Peripheral vascular disease, unspecified: Secondary | ICD-10-CM | POA: Diagnosis present

## 2022-03-31 LAB — BLOOD CULTURE ID PANEL (REFLEXED) - BCID2

## 2022-03-31 LAB — LACTIC ACID, PLASMA
Lactic Acid, Venous: 2.7 mmol/L (ref 0.5–1.9)
Lactic Acid, Venous: 2.1 mmol/L (ref 0.5–1.9)
Lactic Acid, Venous: 1.8 mmol/L (ref 0.5–1.9)

## 2022-03-31 LAB — COMPREHENSIVE METABOLIC PANEL
ALT: 18 U/L (ref 0–44)
AST: 29 U/L (ref 15–41)
Albumin: 3.4 g/dL — ABNORMAL LOW (ref 3.5–5.0)
Alkaline Phosphatase: 58 U/L (ref 38–126)
Anion gap: 10 (ref 5–15)
BUN: 37 mg/dL — ABNORMAL HIGH (ref 8–23)
CO2: 27 mmol/L (ref 22–32)
Calcium: 9.5 mg/dL (ref 8.9–10.3)
Chloride: 105 mmol/L (ref 98–111)
Creatinine, Ser: 1.15 mg/dL — ABNORMAL HIGH (ref 0.44–1.00)
GFR, Estimated: 46 mL/min — ABNORMAL LOW (ref >60)
Glucose, Bld: 97 mg/dL (ref 70–99)
Potassium: 4 mmol/L (ref 3.5–5.1)
Sodium: 142 mmol/L (ref 135–145)
Total Bilirubin: 1.1 mg/dL (ref 0.3–1.2)
Total Protein: 6.8 g/dL (ref 6.5–8.1)

## 2022-03-31 LAB — URINALYSIS, COMPLETE (UACMP) WITH MICROSCOPIC
Bacteria, UA: NONE SEEN
Bilirubin Urine: NEGATIVE
Glucose, UA: NEGATIVE mg/dL
Ketones, ur: NEGATIVE mg/dL
Leukocytes,Ua: NEGATIVE
Nitrite: NEGATIVE
Protein, ur: NEGATIVE mg/dL
Specific Gravity, Urine: 1.029 (ref 1.005–1.030)
Squamous Epithelial / HPF: NONE SEEN (ref 0–5)
pH: 5 (ref 5.0–8.0)

## 2022-03-31 LAB — BLOOD GAS, VENOUS
Acid-Base Excess: 3.4 mmol/L — ABNORMAL HIGH (ref 0.0–2.0)
Bicarbonate: 30.4 mmol/L — ABNORMAL HIGH (ref 20.0–28.0)
O2 Content: 7 L/min
O2 Saturation: 52.9 %
Patient temperature: 37
pCO2, Ven: 55 mmHg (ref 44–60)
pH, Ven: 7.35 (ref 7.25–7.43)
pO2, Ven: 31 mmHg — CL (ref 32–45)

## 2022-03-31 LAB — CBC WITH DIFFERENTIAL/PLATELET
Abs Immature Granulocytes: 0.01 10*3/uL (ref 0.00–0.07)
Basophils Absolute: 0.1 10*3/uL (ref 0.0–0.1)
Basophils Relative: 1 %
Eosinophils Absolute: 0 10*3/uL (ref 0.0–0.5)
Eosinophils Relative: 0 %
HCT: 50.2 % — ABNORMAL HIGH (ref 36.0–46.0)
Hemoglobin: 15.3 g/dL — ABNORMAL HIGH (ref 12.0–15.0)
Immature Granulocytes: 0 %
Lymphocytes Relative: 6 %
Lymphs Abs: 0.3 10*3/uL — ABNORMAL LOW (ref 0.7–4.0)
MCH: 28.4 pg (ref 26.0–34.0)
MCHC: 30.5 g/dL (ref 30.0–36.0)
MCV: 93.1 fL (ref 80.0–100.0)
Monocytes Absolute: 0.1 10*3/uL (ref 0.1–1.0)
Monocytes Relative: 2 %
Neutro Abs: 4.6 10*3/uL (ref 1.7–7.7)
Neutrophils Relative %: 91 %
Platelets: 197 10*3/uL (ref 150–400)
RBC: 5.39 MIL/uL — ABNORMAL HIGH (ref 3.87–5.11)
RDW: 14.4 % (ref 11.5–15.5)
Smear Review: NORMAL
WBC: 5.1 10*3/uL (ref 4.0–10.5)
nRBC: 0 % (ref 0.0–0.2)

## 2022-03-31 LAB — TROPONIN I (HIGH SENSITIVITY)
Troponin I (High Sensitivity): 18 ng/L — ABNORMAL HIGH (ref <18)
Troponin I (High Sensitivity): 18 ng/L — ABNORMAL HIGH (ref <18)

## 2022-03-31 LAB — D-DIMER, QUANTITATIVE: D-Dimer, Quant: 2.91 ug/mL-FEU — ABNORMAL HIGH (ref 0.00–0.50)

## 2022-03-31 LAB — BRAIN NATRIURETIC PEPTIDE: B Natriuretic Peptide: 354.4 pg/mL — ABNORMAL HIGH (ref 0.0–100.0)

## 2022-03-31 LAB — STREP PNEUMONIAE URINARY ANTIGEN: Strep Pneumo Urinary Antigen: NEGATIVE

## 2022-03-31 LAB — PROCALCITONIN: Procalcitonin: 22.35 ng/mL

## 2022-03-31 LAB — SARS CORONAVIRUS 2 BY RT PCR: SARS Coronavirus 2 by RT PCR: NEGATIVE

## 2022-03-31 MED ORDER — POLYVINYL ALCOHOL 1.4 % OP SOLN
1.0000 [drp] | OPHTHALMIC | Status: DC | PRN
Start: 2022-03-31 — End: 2022-04-08
  Administered 2022-04-01 – 2022-04-06 (×3): 1 [drp] via OPHTHALMIC
  Filled 2022-03-31: qty 15

## 2022-03-31 MED ORDER — ONDANSETRON HCL 4 MG/2ML IJ SOLN: 4.0000 mg | Freq: Three times a day (TID) | INTRAMUSCULAR | Status: DC | PRN

## 2022-03-31 MED ORDER — SODIUM CHLORIDE 0.9 % IV SOLN
2.0000 g | Freq: Once | INTRAVENOUS | Status: AC
  Administered 2022-03-31: 2 g via INTRAVENOUS
  Filled 2022-03-31: qty 20

## 2022-03-31 MED ORDER — LORAZEPAM 2 MG/ML IJ SOLN
0.5000 mg | Freq: Once | INTRAMUSCULAR | Status: AC
  Administered 2022-03-31: 0.5 mg via INTRAVENOUS
  Filled 2022-03-31: qty 1

## 2022-03-31 MED ORDER — SODIUM CHLORIDE 0.9 % IV SOLN
500.0000 mg | INTRAVENOUS | Status: AC
  Administered 2022-04-01 – 2022-04-04 (×4): 500 mg via INTRAVENOUS
  Filled 2022-03-31 (×4): qty 5

## 2022-03-31 MED ORDER — ALBUTEROL SULFATE (2.5 MG/3ML) 0.083% IN NEBU: 2.5000 mg | INHALATION_SOLUTION | RESPIRATORY_TRACT | Status: DC | PRN

## 2022-03-31 MED ORDER — METHYLPREDNISOLONE SODIUM SUCC 125 MG IJ SOLR
125.0000 mg | Freq: Once | INTRAMUSCULAR | Status: AC
  Administered 2022-03-31: 125 mg via INTRAVENOUS
  Filled 2022-03-31: qty 2

## 2022-03-31 MED ORDER — AMLODIPINE BESYLATE 10 MG PO TABS
10.0000 mg | ORAL_TABLET | Freq: Every day | ORAL | Status: DC
  Administered 2022-04-01 – 2022-04-06 (×5): 10 mg via ORAL
  Filled 2022-03-31 (×5): qty 1
  Filled 2022-03-31: qty 2
  Filled 2022-03-31: qty 1

## 2022-03-31 MED ORDER — SODIUM CHLORIDE 0.9 % IV SOLN
500.0000 mg | Freq: Once | INTRAVENOUS | Status: AC
  Administered 2022-03-31: 500 mg via INTRAVENOUS
  Filled 2022-03-31: qty 5

## 2022-03-31 MED ORDER — ACETAMINOPHEN 325 MG PO TABS
650.0000 mg | ORAL_TABLET | Freq: Four times a day (QID) | ORAL | Status: DC | PRN
  Administered 2022-04-05: 650 mg via ORAL
  Filled 2022-03-31: qty 2

## 2022-03-31 MED ORDER — METOPROLOL SUCCINATE ER 25 MG PO TB24
25.0000 mg | ORAL_TABLET | Freq: Every day | ORAL | Status: DC
  Administered 2022-04-01 – 2022-04-06 (×6): 25 mg via ORAL
  Filled 2022-03-31 (×8): qty 1

## 2022-03-31 MED ORDER — VITAMIN B-12 1000 MCG PO TABS
1000.0000 ug | ORAL_TABLET | Freq: Every day | ORAL | Status: DC
  Administered 2022-04-01 – 2022-04-06 (×5): 1000 ug via ORAL
  Filled 2022-03-31 (×8): qty 1

## 2022-03-31 MED ORDER — POLYETHYLENE GLYCOL 3350 17 G PO PACK
17.0000 g | PACK | Freq: Every day | ORAL | Status: DC | PRN
  Administered 2022-04-03 – 2022-04-04 (×2): 17 g via ORAL
  Filled 2022-03-31 (×2): qty 1

## 2022-03-31 MED ORDER — ASPIRIN 81 MG PO TBEC
81.0000 mg | DELAYED_RELEASE_TABLET | Freq: Every day | ORAL | Status: DC
  Administered 2022-04-01 – 2022-04-06 (×5): 81 mg via ORAL
  Filled 2022-03-31 (×7): qty 1

## 2022-03-31 MED ORDER — IPRATROPIUM-ALBUTEROL 0.5-2.5 (3) MG/3ML IN SOLN: 9.0000 mL | Freq: Once | RESPIRATORY_TRACT | Status: AC

## 2022-03-31 MED ORDER — SODIUM CHLORIDE 0.9 % IV SOLN
2.0000 g | INTRAVENOUS | Status: DC
  Administered 2022-04-01 – 2022-04-06 (×6): 2 g via INTRAVENOUS
  Filled 2022-03-31 (×3): qty 20
  Filled 2022-03-31: qty 2
  Filled 2022-03-31 (×2): qty 20

## 2022-03-31 MED ORDER — METHYLPREDNISOLONE SODIUM SUCC 40 MG IJ SOLR
40.0000 mg | Freq: Two times a day (BID) | INTRAMUSCULAR | Status: AC
  Administered 2022-03-31 – 2022-04-03 (×7): 40 mg via INTRAVENOUS
  Filled 2022-03-31 (×7): qty 1

## 2022-03-31 MED ORDER — PANTOPRAZOLE SODIUM 40 MG IV SOLR
40.0000 mg | INTRAVENOUS | Status: DC
  Administered 2022-04-01 – 2022-04-06 (×7): 40 mg via INTRAVENOUS
  Filled 2022-03-31 (×7): qty 10

## 2022-03-31 MED ORDER — SIMVASTATIN 10 MG PO TABS: 40.0000 mg | ORAL_TABLET | Freq: Every day | ORAL | Status: DC

## 2022-03-31 MED ORDER — IOHEXOL 350 MG/ML SOLN
100.0000 mL | Freq: Once | INTRAVENOUS | Status: AC | PRN
Start: 2022-03-31 — End: 2022-03-31
  Administered 2022-03-31: 100 mL via INTRAVENOUS

## 2022-03-31 MED ORDER — PANTOPRAZOLE SODIUM 40 MG PO TBEC: 40.0000 mg | DELAYED_RELEASE_TABLET | Freq: Two times a day (BID) | ORAL | Status: DC

## 2022-03-31 MED ORDER — IPRATROPIUM-ALBUTEROL 0.5-2.5 (3) MG/3ML IN SOLN
RESPIRATORY_TRACT | Status: AC
  Administered 2022-03-31: 9 mL via RESPIRATORY_TRACT
  Filled 2022-03-31: qty 9

## 2022-03-31 MED ORDER — IPRATROPIUM-ALBUTEROL 0.5-2.5 (3) MG/3ML IN SOLN
3.0000 mL | RESPIRATORY_TRACT | Status: DC
  Administered 2022-03-31 – 2022-04-03 (×18): 3 mL via RESPIRATORY_TRACT
  Filled 2022-03-31 (×20): qty 3

## 2022-03-31 MED ORDER — HYDRALAZINE HCL 20 MG/ML IJ SOLN: 5.0000 mg | INTRAMUSCULAR | Status: DC | PRN

## 2022-03-31 MED ORDER — FUROSEMIDE 10 MG/ML IJ SOLN
60.0000 mg | Freq: Two times a day (BID) | INTRAMUSCULAR | Status: DC
  Administered 2022-03-31: 60 mg via INTRAVENOUS
  Filled 2022-03-31: qty 8

## 2022-03-31 MED ORDER — FUROSEMIDE 10 MG/ML IJ SOLN
20.0000 mg | Freq: Two times a day (BID) | INTRAMUSCULAR | Status: DC
  Administered 2022-04-01 – 2022-04-03 (×6): 20 mg via INTRAVENOUS
  Filled 2022-03-31: qty 2
  Filled 2022-03-31: qty 4
  Filled 2022-03-31 (×4): qty 2
  Filled 2022-03-31: qty 4

## 2022-03-31 MED ORDER — LACTATED RINGERS IV BOLUS
500.0000 mL | Freq: Once | INTRAVENOUS | Status: AC
  Administered 2022-03-31: 500 mL via INTRAVENOUS

## 2022-03-31 MED ORDER — QUETIAPINE FUMARATE 25 MG PO TABS
25.0000 mg | ORAL_TABLET | Freq: Every day | ORAL | Status: DC
  Administered 2022-04-01 – 2022-04-05 (×4): 25 mg via ORAL
  Filled 2022-03-31 (×4): qty 1

## 2022-03-31 MED ORDER — DM-GUAIFENESIN ER 30-600 MG PO TB12: 1.0000 | ORAL_TABLET | Freq: Two times a day (BID) | ORAL | Status: DC | PRN

## 2022-03-31 MED ORDER — LACTATED RINGERS IV SOLN: INTRAVENOUS | Status: DC

## 2022-03-31 MED ORDER — ADULT MULTIVITAMIN W/MINERALS CH
1.0000 | ORAL_TABLET | Freq: Every day | ORAL | Status: DC
Start: 2022-03-31 — End: 2022-04-01
  Administered 2022-04-01: 1 via ORAL
  Filled 2022-03-31: qty 1

## 2022-03-31 MED ORDER — ENOXAPARIN SODIUM 40 MG/0.4ML IJ SOSY
40.0000 mg | PREFILLED_SYRINGE | INTRAMUSCULAR | Status: DC
  Administered 2022-03-31 – 2022-04-06 (×7): 40 mg via SUBCUTANEOUS
  Filled 2022-03-31 (×7): qty 0.4

## 2022-03-31 NOTE — Progress Notes (Signed)
PT placed on BIPAP 15/8 80% due to respiratory distress, Duoneb given x3 inline with BIPAP. VBG obtained and resulted with abnormal results read back over the phone to MD.

## 2022-03-31 NOTE — Assessment & Plan Note (Signed)
-   Aspirin, Zocor

## 2022-03-31 NOTE — Assessment & Plan Note (Signed)
-   IV Rocephin and azithromycin - Incentive spirometry - Solu-Medrol 40 mg twice daily - Mucinex for cough  - Bronchodilators - Urine legionella and S. pneumococcal antigen - Follow up blood culture x2, sputum culture

## 2022-03-31 NOTE — Assessment & Plan Note (Signed)
-   Zocor 

## 2022-03-31 NOTE — ED Notes (Addendum)
RN contacted provider about holding pt PO meds due to great need of bipap for pt with resp support and due to her rate. MD agreed. PO meds hold until pt is able to urinate fluid off with lasixs that were given.

## 2022-03-31 NOTE — Assessment & Plan Note (Signed)
Incidental findings by CT scan -Follow-up with PCP

## 2022-03-31 NOTE — Progress Notes (Signed)
PHARMACY - PHYSICIAN COMMUNICATION CRITICAL VALUE ALERT - BLOOD CULTURE IDENTIFICATION (BCID)  Ashley [name] is an 88 y.o. female w/ h/o dCHF, HTN, HLD, PVD, CAD, stroke, SAH, GI bleeding, COPD, GERD, MDD/anxiety, CKD-3A, possible early stage of dementia per her son, who presents with shortness of breath and a fall c/f multifocal pneumonia.  Assessment:  Bcx with 1 of 4 vials (anaerobic) GNR>E.Coli (no resistances detected).  Name of physician (or Provider) Contacted: Dr. Blaine Hamper  Current antibiotics: Receiving Rocephin 2g IV q24h and Azithromycin '500mg'$  IV q24h.   Changes to prescribed antibiotics recommended:  Rocephin appropriate coverage until full susceptibilities are available. No change needed at this time.   Results for orders placed or performed during the hospital encounter of 03/30/2022  Blood Culture ID Panel (Reflexed) (Collected: 04/07/2022  6:40 AM)  Result Value Ref Range   Enterococcus faecalis NOT DETECTED NOT DETECTED   Enterococcus Faecium NOT DETECTED NOT DETECTED   Listeria monocytogenes NOT DETECTED NOT DETECTED   Staphylococcus species NOT DETECTED NOT DETECTED   Staphylococcus aureus (BCID) NOT DETECTED NOT DETECTED   Staphylococcus epidermidis NOT DETECTED NOT DETECTED   Staphylococcus lugdunensis NOT DETECTED NOT DETECTED   Streptococcus species NOT DETECTED NOT DETECTED   Streptococcus agalactiae NOT DETECTED NOT DETECTED   Streptococcus pneumoniae NOT DETECTED NOT DETECTED   Streptococcus pyogenes NOT DETECTED NOT DETECTED   A.calcoaceticus-baumannii NOT DETECTED NOT DETECTED   Bacteroides fragilis NOT DETECTED NOT DETECTED   Enterobacterales DETECTED (A) NOT DETECTED   Enterobacter cloacae complex NOT DETECTED NOT DETECTED   Escherichia coli DETECTED (A) NOT DETECTED   Klebsiella aerogenes NOT DETECTED NOT DETECTED   Klebsiella oxytoca NOT DETECTED NOT DETECTED   Klebsiella pneumoniae NOT DETECTED NOT DETECTED   Proteus species NOT DETECTED NOT DETECTED    Salmonella species NOT DETECTED NOT DETECTED   Serratia marcescens NOT DETECTED NOT DETECTED   Haemophilus influenzae NOT DETECTED NOT DETECTED   Neisseria meningitidis NOT DETECTED NOT DETECTED   Pseudomonas aeruginosa NOT DETECTED NOT DETECTED   Stenotrophomonas maltophilia NOT DETECTED NOT DETECTED   Candida albicans NOT DETECTED NOT DETECTED   Candida auris NOT DETECTED NOT DETECTED   Candida glabrata NOT DETECTED NOT DETECTED   Candida krusei NOT DETECTED NOT DETECTED   Candida parapsilosis NOT DETECTED NOT DETECTED   Candida tropicalis NOT DETECTED NOT DETECTED   Cryptococcus neoformans/gattii NOT DETECTED NOT DETECTED   CTX-M ESBL NOT DETECTED NOT DETECTED   Carbapenem resistance IMP NOT DETECTED NOT DETECTED   Carbapenem resistance KPC NOT DETECTED NOT DETECTED   Carbapenem resistance NDM NOT DETECTED NOT DETECTED   Carbapenem resist OXA 48 LIKE NOT DETECTED NOT DETECTED   Carbapenem resistance VIM NOT DETECTED NOT DETECTED    Lorna Dibble 04/20/2022  7:29 PM

## 2022-03-31 NOTE — Progress Notes (Signed)
Patient transported to CT from the ED without complication. ?

## 2022-03-31 NOTE — Assessment & Plan Note (Signed)
-   Continue home medications 

## 2022-03-31 NOTE — Plan of Care (Signed)
Floor coverage note  Called to bedside by nurse because of persistent tachypnea in spite of BiPAP times several hours  88 y.o. female with medical history significant of hypertension, hyperlipidemia, COPD, stroke, GERD, depression with anxiety, PVD, CAD, GI bleeding, SAH, dCHF, CKD-3A, possible early stage of dementia , admitted earlier with acute on chronic respiratory failure of multifactorial etiology including diastolic CHF exacerbation, multifocal pneumonia and COPD exacerbation as well as severe sepsis.  Patient has been persistently tachypneic, mildly tachycardic, currently on BiPAP 16/8 and saturating at around 92%.  Has been mentating at baseline though somewhat confused as the evening went by.  2 sons at the bedside  Physical Exam Constitutional:      General: She is awake.     Appearance: She is obese.     Comments: BiPAP on  Cardiovascular:     Rate and Rhythm: Tachycardia present.  Pulmonary:     Effort: Tachypnea present.     Breath sounds: Examination of the right-lower field reveals rales. Examination of the left-lower field reveals rales. Decreased breath sounds and rales present.  Abdominal:     Tenderness: There is no abdominal tenderness.  Neurological:     General: No focal deficit present.   ABG    Component Value Date/Time   PHART 7.39 04/24/2022 2219   PCO2ART 53 (H) 04/18/2022 2219   PO2ART 76 (L) 04/19/2022 2219   HCO3 32.1 (H) 04/14/2022 2219   O2SAT 97.6 03/26/2022 2219    Acute respiratory failure with hypoxia Compensated respiratory acidosis - Multifactorial: CHF and COPD exacerbation and multifocal pneumonia - H&P reviewed, prior imaging including CTA chest reviewed - ABG ordered and results noted above  -Discussed with respiratory therapist, will give patient breaks from [place] on highflow -Patient became more tachypneic and tachycardic with foods break off BiPAP so we will keep on BiPAP for the night, trial on high flow in the a.m. - Meds  transitioned to IV  CRITICAL CARE Performed by: Athena Masse   Total critical care time: 35 minutes  Critical care time was exclusive of separately billable procedures and treating other patients.  Critical care was necessary to treat or prevent imminent or life-threatening deterioration.  Critical care was time spent personally by me on the following activities: development of treatment plan with patient and/or surrogate as well as nursing, discussions with consultants, evaluation of patient's response to treatment, examination of patient, obtaining history from patient or surrogate, ordering and performing treatments and interventions, ordering and review of laboratory studies, ordering and review of radiographic studies, pulse oximetry and re-evaluation of patient's condition.

## 2022-03-31 NOTE — Assessment & Plan Note (Signed)
2D echo 07/13/2020 showed EF of 55-60% with grade 1 diastolic dysfunction.  Now patient has CHF exacerbation. -Lasix 60 mg bid by IV -2d echo -Daily weights -strict I/O's -Low salt diet -Fluid restriction

## 2022-03-31 NOTE — Consult Note (Signed)
CODE SEPSIS - PHARMACY COMMUNICATION  **Broad Spectrum Antibiotics should be administered within 1 hour of Sepsis diagnosis**  Time Code Sepsis Called/Page Received: 0732  Antibiotics Ordered: Ceftriaxone and azithromycin  Time of 1st antibiotic administration: 0717  Additional action taken by pharmacy: none  If necessary, Name of Provider/Nurse Contacted: n/a   Preston Weill Rodriguez-Guzman PharmD, BCPS 04/05/2022 7:58 AM

## 2022-03-31 NOTE — Assessment & Plan Note (Signed)
Likely multifactorial etiology, including acute on chronic diastolic CHF exacerbation, COPD exacerbation and possible multifocal pneumonia.  Patient has 2+ leg edema, crackles on auscultation, positive JVD, clinically consistent with CHF exacerbation.  Patient has wheezing, indicating COPD exacerbation.  Both chest x-ray and CTA showed multifocal infiltration, this could be due to pulmonary edema, but patient has significantly elevated procalcitonin 22.35, cannot completely rule out multifocal pneumonia.  Will treat patient as CHF exacerbation, COPD exacerbation and possible multifocal pneumonia now.  -Admitted to PCU as inpatient -Continue BiPAP -Bronchodilators -IV Lasix

## 2022-03-31 NOTE — ED Notes (Signed)
Pt was transported to [place] with RN. Pt incontinent of stool. Pericare performed and purewick placed.

## 2022-03-31 NOTE — H&P (Signed)
History and Physical    Ashley [name] XTK:[id number] DOB: 1931-08-22 DOA: 04/01/2022  Referring MD/NP/PA:   PCP: Albina Billet, MD   Patient coming from:  The patient is coming from home.     Chief Complaint: SOB and fall  HPI: Ashley [name] is a 88 y.o. female with medical history significant of hypertension, hyperlipidemia, COPD, stroke, GERD, depression with anxiety, PVD, CAD, GI bleeding, SAH, dCHF, CKD-3A, possible early stage of dementia per her son, who presents with shortness of breath and a fall.  Per his son at the bedside, patient was found on the floor with suspected fall at about 4:30 AM.  Patient does not seem to have significant injury.  Patient was found to have acute respiratory distress.  Patient is normally not using oxygen.  She was found to have oxygen desaturating to 80% on room air, using accessory muscle for breathing, BiPAP was started in ED.  Patient has audible wheezing and cough with little mucus production.  Denies chest pain.  No fever or chills.  Patient does not have nausea vomiting, diarrhea or abdominal pain.  No symptoms of UTI.  Patient has bilateral lower leg edema.  Per her son, mental status is at baseline.  Patient is DNR per her son.  Data Reviewed and ED Course: pt was found to have BNP 354, procalcitonin 22.35, troponin level 18, 18, lactic acid 2.7, 2.1, WBC 5.1, negative COVID PCR, D-dimer +2.91, worsening renal function, temperature normal, blood pressure 136/75, heart rate 109, RR 48, 32.  CT of head is negative for acute intracranial abnormalities.  CT abdomen is negative for acute intra-abdominal issues, but showed 3.3 cm AAA.  Chest x-ray showed bilateral hazy opacity.  CT of T-spine and L-spine are negative for acute injury.  CTA negative for PE, but showed multifocal infiltration.  CT of C-spine is negative for acute injury, but showed dense consolidation versus hemorrhage in the posterior right lung apex. CTA only showed bilateral airspace  consolidation. LE doppler negative for DVT.  VBG with pH 7.35, CO2 55 and O2 31. Patient is admitted to PCU as inpt.   CTA 1. No evidence for acute pulmonary embolus. 2. Extensive bilateral airspace consolidation compatible with multifocal pneumonia. 3. No acute findings within the abdomen or pelvis. 4. Infrarenal abdominal aortic aneurysm measures 3.3 cm. Recommend follow-up ultrasound every 3 years. This recommendation follows ACR consensus guidelines: White Paper of the ACR Incidental Findings Committee II on Vascular Findings. J Am Coll Radiol 2013; 99:242-683. 5. Umbilical hernia contains nonobstructed loops of small bowel. 6. Hiatal hernia. 7. Aortic Atherosclerosis (ICD10-I70.0) and Emphysema (ICD10-J43.9).   EKG: I have personally reviewed.  Sinus rhythm, QTc 447, low voltage, nonspecific T wave change   Review of Systems:   General: no fevers, chills, no body weight gain,  has fatigue HEENT: no blurry vision, hearing changes or sore throat Respiratory: has dyspnea, coughing, wheezing CV: no chest pain, no palpitations GI: no nausea, vomiting, abdominal pain, diarrhea, constipation GU: no dysuria, burning on urination, increased urinary frequency, hematuria  Ext: has leg edema Neuro: no unilateral weakness, numbness, or tingling, no vision change or hearing loss. Has fall Skin: no rash, no skin tear. MSK: No muscle spasm, no deformity, no limitation of range of movement in spin Heme: No easy bruising.  Travel history: No recent long distant travel.   Allergy: No Known Allergies  Past Medical History:  Diagnosis Date   Anemia    Anxiety    Arthritis  COPD (chronic obstructive pulmonary disease) (HCC)    Coronary artery disease    Edema    feet/ankles   wears support hose   GERD (gastroesophageal reflux disease)    GI (gastrointestinal bleed)    history   Hypertension    Myocardial infarction ([place])    Peripheral vascular disease (HCC)    Shortness of  breath dyspnea     Past Surgical History:  Procedure Laterality Date   ABDOMINAL HYSTERECTOMY     ABLATION     right leg less than 1 year ago   CATARACT EXTRACTION W/ INTRAOCULAR LENS  IMPLANT, BILATERAL Bilateral    CHOLECYSTECTOMY N/A 07/18/2018   Procedure: LAPAROSCOPIC CHOLECYSTECTOMY;  Surgeon: Jules Husbands, MD;  Location: [facility] ORS;  Service: General;  Laterality: N/A;   CORONARY ANGIOPLASTY     stents   ENDOSCOPIC RETROGRADE CHOLANGIOPANCREATOGRAPHY (ERCP) WITH PROPOFOL N/A 07/15/2018   Procedure: ENDOSCOPIC RETROGRADE CHOLANGIOPANCREATOGRAPHY (ERCP) WITH PROPOFOL;  Surgeon: [name], MD;  Location: [facility] END[facility];  Service: Endoscopy;  Laterality: N/A;   EYE SURGERY     FRACTURE SURGERY     right wrist   TOTAL HIP ARTHROPLASTY Right 02/20/2016   Procedure: TOTAL HIP ARTHROPLASTY ANTERIOR APPROACH;  Surgeon: Hessie Knows, MD;  Location: [facility] ORS;  Service: Orthopedics;  Laterality: Right;    Social History:  reports that she quit smoking about 25 years ago. She has never used smokeless tobacco. She reports that she does not drink alcohol and does not use drugs.  Family History:  Family History  Problem Relation Age of Onset   Heart attack Father    Stroke Sister    Testicular cancer Brother    COPD Brother      Prior to Admission medications   Medication Sig Start Date End Date Taking? Authorizing Provider  acetaminophen (TYLENOL) 500 MG tablet Take 2 tablets (1,000 mg total) by mouth every 6 (six) hours as needed for mild pain, headache or fever. 12/13/20  Yes Allie Bossier, MD  amLODipine (NORVASC) 10 MG tablet Take 10 mg by mouth daily.   Yes [provider]  aspirin EC 81 MG EC tablet Take 1 tablet (81 mg total) by mouth daily. Swallow whole. 12/13/20  Yes Allie Bossier, MD  Ferrous Sulfate (IRON) 325 (65 Fe) MG TABS Take 325 mg by mouth once a week.   Yes [provider]  Ipratropium-Albuterol (COMBIVENT) 20-100 MCG/ACT AERS respimat Inhale 1  puff into the lungs in the morning, at noon, in the evening, and at bedtime.   Yes [provider]  metoprolol succinate (TOPROL-XL) 25 MG 24 hr tablet Take 25 mg by mouth daily.   Yes [provider]  Multiple Vitamin (MULTIVITAMIN WITH MINERALS) TABS tablet Take 1 tablet by mouth daily. 12/13/20  Yes Allie Bossier, MD  pantoprazole (PROTONIX) 40 MG tablet Take 40 mg by mouth 2 (two) times daily.   Yes [provider]  polyethylene glycol (MIRALAX / GLYCOLAX) 17 g packet Take 17 g by mouth daily. 12/13/20  Yes Allie Bossier, MD  polyvinyl alcohol (LIQUIFILM TEARS) 1.4 % ophthalmic solution Place 1 drop into both eyes as needed for dry eyes. 12/13/20  Yes Allie Bossier, MD  QUEtiapine (SEROQUEL) 25 MG tablet Take 1 tablet (25 mg total) by mouth at bedtime. 12/13/20  Yes Allie Bossier, MD  simvastatin (ZOCOR) 40 MG tablet Take 40 mg by mouth daily.   Yes [provider]  vitamin B-12 (CYANOCOBALAMIN) 1000 MCG tablet  Take 1,000 mcg by mouth daily.   Yes [provider]  feeding supplement (ENSURE ENLIVE / ENSURE PLUS) LIQD Take 237 mLs by mouth 2 (two) times daily between meals. Patient not taking: Reported on 07/01/2021 12/13/20   Allie Bossier, MD  furosemide (LASIX) 20 MG tablet Take 1-2 tablets by mouth daily. 03/16/22   [provider]  ondansetron (ZOFRAN) 4 MG tablet Take 1 tablet (4 mg total) by mouth every 6 (six) hours as needed for nausea. Patient not taking: Reported on 03/28/2022 12/13/20   Allie Bossier, MD    Physical Exam: Vitals:   04/21/2022 0930 03/29/2022 1000 04/13/2022 1030 04/10/2022 1100  BP: 134/74 136/75 (!) 147/77 (!) 154/81  Pulse: (!) 102 (!) 102 (!) 109 (!) 112  Resp: 20 (!) 32 (!) 37 (!) 34  Temp:      TempSrc:      SpO2: 96% 93% 93% 93%  Weight:      Height:       General: Not in acute distress HEENT:       Eyes: PERRL, EOMI, no scleral icterus.       ENT: No discharge from the ears and nose, no pharynx  injection, no tonsillar enlargement.        Neck: has positive JVD, no bruit, no mass felt. Heme: No neck lymph node enlargement. Cardiac: S1/S2, RRR, No murmurs, No gallops or rubs. Respiratory: Has wheezing and crackles bilaterally GI: Soft, nondistended, nontender, no rebound pain, no organomegaly, BS present. GU: No hematuria Ext: 2+ pitting leg edema bilaterally. 1+DP/PT pulse bilaterally. Musculoskeletal: No joint deformities, No joint redness or warmth, no limitation of ROM in spin. Skin: No rashes.  Neuro: Alert, oriented X3, cranial nerves II-XII grossly intact, moves all extremities normally. Psych: Patient is not psychotic, no suicidal or hemocidal ideation.  Labs on Admission: I have personally reviewed following labs and imaging studies  CBC: Recent Labs  Lab 04/07/2022 0636  WBC 5.1  NEUTROABS 4.6  HGB 15.3*  HCT 50.2*  MCV 93.1  PLT 053   Basic Metabolic Panel: Recent Labs  Lab 04/11/2022 0636  NA 142  K 4.0  CL 105  CO2 27  GLUCOSE 97  BUN 37*  CREATININE 1.15*  CALCIUM 9.5   GFR: Estimated Creatinine Clearance: 35.2 mL/min (A) (by C-G formula based on SCr of 1.15 mg/dL (H)). Liver Function Tests: Recent Labs  Lab 04/17/2022 0636  AST 29  ALT 18  ALKPHOS 58  BILITOT 1.1  PROT 6.8  ALBUMIN 3.4*   No results for input(s): LIPASE, AMYLASE in the last 168 hours. No results for input(s): AMMONIA in the last 168 hours. Coagulation Profile: No results for input(s): INR, PROTIME in the last 168 hours. Cardiac Enzymes: No results for input(s): CKTOTAL, CKMB, CKMBINDEX, TROPONINI in the last 168 hours. BNP (last 3 results) No results for input(s): PROBNP in the last 8760 hours. HbA1C: No results for input(s): HGBA1C in the last 72 hours. CBG: No results for input(s): GLUCAP in the last 168 hours. Lipid Profile: No results for input(s): CHOL, HDL, LDLCALC, TRIG, CHOLHDL, LDLDIRECT in the last 72 hours. Thyroid Function Tests: No results for input(s):  TSH, T4TOTAL, FREET4, T3FREE, THYROIDAB in the last 72 hours. Anemia Panel: No results for input(s): VITAMINB12, FOLATE, FERRITIN, TIBC, IRON, RETICCTPCT in the last 72 hours. Urine analysis:    Component Value Date/Time   COLORURINE YELLOW (A) 12/06/2020 1340   APPEARANCEUR HAZY (A) 12/06/2020 1340   LABSPEC 1.019 12/06/2020  [place] 5.0 12/06/2020 1340   GLUCOSEU NEGATIVE 12/06/2020 1340   HGBUR SMALL (A) 12/06/2020 1340   BILIRUBINUR NEGATIVE 12/06/2020 1340   KETONESUR 5 (A) 12/06/2020 1340   PROTEINUR NEGATIVE 12/06/2020 1340   NITRITE NEGATIVE 12/06/2020 1340   LEUKOCYTESUR NEGATIVE 12/06/2020 1340   Sepsis Labs: '@LABRCNTIP'$ (procalcitonin:4,lacticidven:4) ) Recent Results (from the past 240 hour(s))  SARS Coronavirus 2 by RT PCR (hospital order, performed in [facility] hospital lab) *cepheid single result test* Anterior Nasal Swab     Status: None   Collection Time: 04/16/2022  6:36 AM   Specimen: Anterior Nasal Swab  Result Value Ref Range Status   SARS Coronavirus 2 by RT PCR NEGATIVE NEGATIVE Final    Comment: Performed at [facility], [address] [place], [place] [address]  Blood culture (routine x 2)     Status: None (Preliminary result)   Collection Time: 04/06/2022  6:40 AM   Specimen: BLOOD  Result Value Ref Range Status   Specimen Description BLOOD LEFT ANTECUBITAL  Final   Special Requests   Final    BOTTLES DRAWN AEROBIC AND ANAEROBIC Blood Culture adequate volume   Culture   Final    NO GROWTH <12 HOURS Performed at [facility], [address] [place], [place] [address]    Report Status PENDING  Incomplete  Blood culture (routine x 2)     Status: None (Preliminary result)   Collection Time: 04/07/2022  6:40 AM   Specimen: BLOOD  Result Value Ref Range Status   Specimen Description BLOOD BLOOD RIGHT HAND  Final   Special Requests   Final    BOTTLES DRAWN AEROBIC AND ANAEROBIC Blood Culture results may not be optimal due to an  inadequate volume of blood received in culture bottles   Culture   Final    NO GROWTH <12 HOURS Performed at [facility], [address] [place],  [address]    Report Status PENDING  Incomplete     Radiological Exams on Admission: [place] (5MM)  Result Date: 04/12/2022 CLINICAL DATA:  Head trauma. EXAM: CT HEAD WITHOUT CONTRAST TECHNIQUE: Contiguous axial images were obtained from the base of the skull through the vertex without intravenous contrast. RADIATION DOSE REDUCTION: This exam was performed according to the departmental dose-optimization program which includes automated exposure control, adjustment of the mA and/or kV according to patient size and/or use of iterative reconstruction technique. COMPARISON:  December 07, 2020 FINDINGS: Brain: No evidence of acute infarction, hemorrhage, hydrocephalus, extra-axial collection or mass lesion/mass effect. Stable encephalomalacia from right PCA territory infarction. Advanced deep white matter microangiopathy. Vascular: No hyperdense vessel or unexpected calcification. Skull: Normal. Negative for fracture or focal lesion. Sinuses/Orbits: No acute finding. Other: None. IMPRESSION: 1. No acute intracranial abnormality. 2. Stable encephalomalacia from right PCA territory infarction. 3. Advanced deep white matter microangiopathy. Electronically Signed   By: Fidela Salisbury M.D.   On: 04/12/2022 10:05   CT Angio Chest PE W and/or Wo Contrast  Result Date: 04/09/2022 CLINICAL DATA:  Pulmonary embolism suspected. Chronic lower extremity edema. Abdominal pain. EXAM: CT ANGIOGRAPHY CHEST CT ABDOMEN AND PELVIS WITH CONTRAST TECHNIQUE: Multidetector CT imaging of the chest was performed using the standard protocol during bolus administration of intravenous contrast. Multiplanar CT image reconstructions and MIPs were obtained to evaluate the vascular anatomy. Multidetector CT imaging of the abdomen and pelvis was performed using  the standard protocol during bolus administration of intravenous contrast. RADIATION DOSE REDUCTION: This  exam was performed according to the departmental dose-optimization program which includes automated exposure control, adjustment of the mA and/or kV according to patient size and/or use of iterative reconstruction technique. CONTRAST:  120m OMNIPAQUE IOHEXOL 350 MG/ML SOLN COMPARISON:  CT AP 07/15/2018 CT chest 06/02/2011 FINDINGS: CTA CHEST FINDINGS Cardiovascular: Satisfactory opacification of the pulmonary arteries to the segmental level. No evidence of pulmonary embolism. Cardiac enlargement. No pericardial effusion. Aortic atherosclerosis and coronary artery calcifications. Mediastinum/Nodes: Prominent mediastinal and hilar lymph nodes are favored to represent reactive change. No adenopathy identified. Thyroid gland, trachea, and esophagus demonstrate no significant findings. Lungs/Pleura: Dense airspace consolidation is identified within bilateral lower lobes and right middle lobe concerning for multifocal pneumonia. Posterior right upper lobe ground-glass and airspace opacification is also noted concerning for pneumonia. No signs of pleural effusion or interstitial edema. Pneumatocele identified within the left upper lobe measuring 1.8 cm, image 48/6. Mild paraseptal emphysema. Musculoskeletal: Mild scoliosis of the thoracic spine is convex towards the left. Thoracic ankylosis identified. Remote deformity involving the sternal manubrium identified, likely posttraumatic. Review of the MIP images confirms the above findings. CT ABDOMEN and PELVIS FINDINGS Hepatobiliary: No focal liver abnormality. Previous cholecystectomy. Mild fusiform dilatation of the CBD measures scratch set fusiform dilatation of the common bile duct measures up to 1 cm, image 46/5. Pancreas: Unremarkable. No pancreatic ductal dilatation or surrounding inflammatory changes. Spleen: Normal in size without focal abnormality.  Adrenals/Urinary Tract: Bilateral Bosniak class 1 and 2 kidney cysts are identified. The largest arises off the lateral cortex of the upper pole of right kidney measuring 5.1 cm, image 27/2. No follow-up recommended. No nephrolithiasis or hydronephrosis identified bilaterally. Urinary bladder is partially obscured by streak artifact from patient's right hip arthroplasty. No acute abnormality noted. Stomach/Bowel: Large hiatal hernia. The appendix is not confidently identified. Sigmoid diverticulosis without signs of acute diverticulitis. There is a periumbilical hernia which contains nonobstructed loops of small bowel, image 89/6. Vascular/Lymphatic: Extensive aortic atherosclerosis. Infrarenal abdominal aortic aneurysm measures 3.3 cm, image 38/2. No abdominopelvic adenopathy. Reproductive: Status post hysterectomy. No adnexal masses. Other: No free fluid or fluid collections. No signs of pneumoperitoneum. Musculoskeletal: Right hip arthroplasty. No acute or suspicious osseous findings. Review of the MIP images confirms the above findings. IMPRESSION: 1. No evidence for acute pulmonary embolus. 2. Extensive bilateral airspace consolidation compatible with multifocal pneumonia. 3. No acute findings within the abdomen or pelvis. 4. Infrarenal abdominal aortic aneurysm measures 3.3 cm. Recommend follow-up ultrasound every 3 years. This recommendation follows ACR consensus guidelines: White Paper of the ACR Incidental Findings Committee II on Vascular Findings. J Am Coll Radiol 2013; 116:109-604 5. Umbilical hernia contains nonobstructed loops of small bowel. 6. Hiatal hernia. 7. Aortic Atherosclerosis (ICD10-I70.0) and Emphysema (ICD10-J43.9). Electronically Signed   By: TKerby MoorsM.D.   On: 04/14/2022 10:14   CT Cervical Spine Wo Contrast  Result Date: 04/16/2022 CLINICAL DATA:  Status post neck trauma. EXAM: CT CERVICAL SPINE WITHOUT CONTRAST TECHNIQUE: Multidetector CT imaging of the cervical spine was  performed without intravenous contrast. Multiplanar CT image reconstructions were also generated. RADIATION DOSE REDUCTION: This exam was performed according to the departmental dose-optimization program which includes automated exposure control, adjustment of the mA and/or kV according to patient size and/or use of iterative reconstruction technique. COMPARISON:  None Available. FINDINGS: Alignment: Normal. Skull base and vertebrae: No acute fracture. No primary bone lesion or focal pathologic process. Soft tissues and spinal canal: No prevertebral fluid or swelling. No visible canal hematoma. Disc levels: Multilevel osteoarthritic  changes of the cervical spine. Upper chest: Dense consolidation versus hemorrhage in the posterior right lung apex, partially visualized. Other: None. IMPRESSION: 1. No acute fracture or subluxation of the cervical spine. 2. Multilevel osteoarthritic changes of the cervical spine. 3. Dense consolidation versus hemorrhage in the posterior right lung apex, partially visualized. Electronically Signed   By: Fidela Salisbury M.D.   On: 04/15/2022 10:09   CT Abdomen Pelvis W Contrast  Result Date: 04/06/2022 CLINICAL DATA:  Pulmonary embolism suspected. Chronic lower extremity edema. Abdominal pain. EXAM: CT ANGIOGRAPHY CHEST CT ABDOMEN AND PELVIS WITH CONTRAST TECHNIQUE: Multidetector CT imaging of the chest was performed using the standard protocol during bolus administration of intravenous contrast. Multiplanar CT image reconstructions and MIPs were obtained to evaluate the vascular anatomy. Multidetector CT imaging of the abdomen and pelvis was performed using the standard protocol during bolus administration of intravenous contrast. RADIATION DOSE REDUCTION: This exam was performed according to the departmental dose-optimization program which includes automated exposure control, adjustment of the mA and/or kV according to patient size and/or use of iterative reconstruction  technique. CONTRAST:  185m OMNIPAQUE IOHEXOL 350 MG/ML SOLN COMPARISON:  CT AP 07/15/2018 CT chest 06/02/2011 FINDINGS: CTA CHEST FINDINGS Cardiovascular: Satisfactory opacification of the pulmonary arteries to the segmental level. No evidence of pulmonary embolism. Cardiac enlargement. No pericardial effusion. Aortic atherosclerosis and coronary artery calcifications. Mediastinum/Nodes: Prominent mediastinal and hilar lymph nodes are favored to represent reactive change. No adenopathy identified. Thyroid gland, trachea, and esophagus demonstrate no significant findings. Lungs/Pleura: Dense airspace consolidation is identified within bilateral lower lobes and right middle lobe concerning for multifocal pneumonia. Posterior right upper lobe ground-glass and airspace opacification is also noted concerning for pneumonia. No signs of pleural effusion or interstitial edema. Pneumatocele identified within the left upper lobe measuring 1.8 cm, image 48/6. Mild paraseptal emphysema. Musculoskeletal: Mild scoliosis of the thoracic spine is convex towards the left. Thoracic ankylosis identified. Remote deformity involving the sternal manubrium identified, likely posttraumatic. Review of the MIP images confirms the above findings. CT ABDOMEN and PELVIS FINDINGS Hepatobiliary: No focal liver abnormality. Previous cholecystectomy. Mild fusiform dilatation of the CBD measures scratch set fusiform dilatation of the common bile duct measures up to 1 cm, image 46/5. Pancreas: Unremarkable. No pancreatic ductal dilatation or surrounding inflammatory changes. Spleen: Normal in size without focal abnormality. Adrenals/Urinary Tract: Bilateral Bosniak class 1 and 2 kidney cysts are identified. The largest arises off the lateral cortex of the upper pole of right kidney measuring 5.1 cm, image 27/2. No follow-up recommended. No nephrolithiasis or hydronephrosis identified bilaterally. Urinary bladder is partially obscured by streak  artifact from patient's right hip arthroplasty. No acute abnormality noted. Stomach/Bowel: Large hiatal hernia. The appendix is not confidently identified. Sigmoid diverticulosis without signs of acute diverticulitis. There is a periumbilical hernia which contains nonobstructed loops of small bowel, image 89/6. Vascular/Lymphatic: Extensive aortic atherosclerosis. Infrarenal abdominal aortic aneurysm measures 3.3 cm, image 38/2. No abdominopelvic adenopathy. Reproductive: Status post hysterectomy. No adnexal masses. Other: No free fluid or fluid collections. No signs of pneumoperitoneum. Musculoskeletal: Right hip arthroplasty. No acute or suspicious osseous findings. Review of the MIP images confirms the above findings. IMPRESSION: 1. No evidence for acute pulmonary embolus. 2. Extensive bilateral airspace consolidation compatible with multifocal pneumonia. 3. No acute findings within the abdomen or pelvis. 4. Infrarenal abdominal aortic aneurysm measures 3.3 cm. Recommend follow-up ultrasound every 3 years. This recommendation follows ACR consensus guidelines: White Paper of the ACR Incidental Findings Committee II on Vascular Findings. J  Am Coll Radiol 2013; 50:932-671. 5. Umbilical hernia contains nonobstructed loops of small bowel. 6. Hiatal hernia. 7. Aortic Atherosclerosis (ICD10-I70.0) and Emphysema (ICD10-J43.9). Electronically Signed   By: Kerby Moors M.D.   On: 04/19/2022 10:14   US Venous Img Lower Bilateral (DVT)  Result Date: 04/02/2022 CLINICAL DATA:  Positive D-dimer EXAM: BILATERAL LOWER EXTREMITY VENOUS DOPPLER ULTRASOUND TECHNIQUE: Gray-scale sonography with graded compression, as well as color Doppler and duplex ultrasound were performed to evaluate the lower extremity deep venous systems from the level of the common femoral vein and including the common femoral, femoral, profunda femoral, popliteal and calf veins including the posterior tibial, peroneal and gastrocnemius veins when  visible. The superficial great saphenous vein was also interrogated. Spectral Doppler was utilized to evaluate flow at rest and with distal augmentation maneuvers in the common femoral, femoral and popliteal veins. COMPARISON:  None Available. FINDINGS: RIGHT LOWER EXTREMITY Common Femoral Vein: No evidence of thrombus. Normal compressibility, respiratory phasicity and response to augmentation. Saphenofemoral Junction: No evidence of thrombus. Normal compressibility and flow on color Doppler imaging. Profunda Femoral Vein: No evidence of thrombus. Normal compressibility and flow on color Doppler imaging. Femoral Vein: No evidence of thrombus. Normal compressibility, respiratory phasicity and response to augmentation. Popliteal Vein: No evidence of thrombus. Normal compressibility, respiratory phasicity and response to augmentation. Calf Veins: No evidence of thrombus. Normal compressibility and flow on color Doppler imaging. Superficial Great Saphenous Vein: No evidence of thrombus. Normal compressibility. Venous Reflux:  None. Other Findings:  None. LEFT LOWER EXTREMITY Common Femoral Vein: No evidence of thrombus. Normal compressibility, respiratory phasicity and response to augmentation. Saphenofemoral Junction: No evidence of thrombus. Normal compressibility and flow on color Doppler imaging. Profunda Femoral Vein: No evidence of thrombus. Normal compressibility and flow on color Doppler imaging. Femoral Vein: No evidence of thrombus. Normal compressibility, respiratory phasicity and response to augmentation. Popliteal Vein: No evidence of thrombus. Normal compressibility, respiratory phasicity and response to augmentation. Calf Veins: No evidence of thrombus. Normal compressibility and flow on color Doppler imaging. Superficial Great Saphenous Vein: No evidence of thrombus. Normal compressibility. Venous Reflux:  None. Other Findings:  None. IMPRESSION: No evidence of deep venous thrombosis in either lower  extremity. Electronically Signed   By: Jacqulynn Cadet M.D.   On: 03/26/2022 12:19   CT T-SPINE NO CHARGE  Result Date: 04/22/2022 CLINICAL DATA:  Unwitnessed fall EXAM: CT THORACIC SPINE WITHOUT CONTRAST TECHNIQUE: Multidetector CT images of the thoracic were obtained using the standard protocol without intravenous contrast. RADIATION DOSE REDUCTION: This exam was performed according to the departmental dose-optimization program which includes automated exposure control, adjustment of the mA and/or kV according to patient size and/or use of iterative reconstruction technique. COMPARISON:  Cervical spine CT 12/05/2020. FINDINGS: Alignment: Exaggerated thoracic kyphosis. No static listhesis. Mild S shaped scoliotic curvature. Vertebrae: Chronic slight superior endplate depression at T1 is unchanged from 12/05/2020. No evidence of acute fracture of the thoracic spine. There is ankylosis of the T3 through T8 vertebral bodies. No suspicious lytic or sclerotic bone lesion is seen. Paraspinal and other soft tissues: Please refer to dedicated CT chest, abdomen, pelvis CT for characterization of the intrathoracic and intra-abdominal findings. No posterior paraspinal abnormality. Disc levels: Intervertebral disc heights are preserved. Minimal facet arthropathy within the thoracic spine. IMPRESSION: 1. No acute fracture or traumatic listhesis of the thoracic spine. 2. Chronic slight superior endplate depression at T1 is unchanged from 12/05/2020. Electronically Signed   By: Davina Poke D.O.   On: 04/20/2022 10:05  CT L-SPINE NO CHARGE  Result Date: 04/22/2022 CLINICAL DATA:  Unwitnessed fall EXAM: CT LUMBAR SPINE WITHOUT CONTRAST TECHNIQUE: Multidetector CT imaging of the lumbar spine was performed without intravenous contrast administration. Multiplanar CT image reconstructions were also generated. RADIATION DOSE REDUCTION: This exam was performed according to the departmental dose-optimization program which  includes automated exposure control, adjustment of the mA and/or kV according to patient size and/or use of iterative reconstruction technique. COMPARISON:  CT 07/13/2020 FINDINGS: Segmentation: 5 lumbar type vertebrae. Alignment: Normal. Vertebrae: Lumbar vertebral body heights are maintained without evidence of fracture. No suspicious lytic or sclerotic bone lesion. Paraspinal and other soft tissues: Please refer to dedicated CT of the abdomen and pelvis for full characterization of the abdominopelvic findings. Disc levels: Intervertebral disc heights are preserved. Mild lower lumbar facet arthropathy. IMPRESSION: No evidence of acute fracture or traumatic malalignment of the lumbar spine. Electronically Signed   By: Davina Poke D.O.   On: 04/14/2022 10:08   DG Chest Port 1 View  Result Date: 04/04/2022 CLINICAL DATA:  Shortness of breath EXAM: PORTABLE CHEST 1 VIEW COMPARISON:  12/05/20 FINDINGS: Cardiomegaly and vascular pedicle widening, chronic. Hazy airspace type density on the right more than left. No visible effusion or pneumothorax. Artifact from EKG leads. IMPRESSION: Hazy opacification of the chest greater on the right, asymmetric edema versus pneumonia. Electronically Signed   By: Jorje Guild M.D.   On: 04/22/2022 06:55      Assessment/Plan Principal Problem:   Acute on chronic respiratory failure with hypoxia (HCC) Active Problems:   Acute on chronic diastolic CHF (congestive heart failure) (HCC)   Multifocal pneumonia   COPD with acute exacerbation (HCC)   Severe sepsis (HCC)   Coronary artery disease   Elevated troponin   Stroke (cerebrum) (HCC)   Hypertension   HLD (hyperlipidemia)   Depression with anxiety   AAA (abdominal aortic aneurysm) (HCC)   Chronic kidney disease, stage 3a (HCC)    Assessment and Plan: * Acute on chronic respiratory failure with hypoxia (HCC) Likely multifactorial etiology, including acute on chronic diastolic CHF exacerbation, COPD  exacerbation and possible multifocal pneumonia.  Patient has 2+ leg edema, crackles on auscultation, positive JVD, clinically consistent with CHF exacerbation.  Patient has wheezing, indicating COPD exacerbation.  Both chest x-ray and CTA showed multifocal infiltration, this could be due to pulmonary edema, but patient has significantly elevated procalcitonin 22.35, cannot completely rule out multifocal pneumonia.  Will treat patient as CHF exacerbation, COPD exacerbation and possible multifocal pneumonia now.  -Admitted to PCU as inpatient -Continue BiPAP -Bronchodilators -IV Lasix    Acute on chronic diastolic CHF (congestive heart failure) ([place]) 2D echo 07/13/2020 showed EF of 55-60% with grade 1 diastolic dysfunction.  Now patient has CHF exacerbation. -Lasix 60 mg bid by IV -2d echo -Daily weights -strict I/O's -Low salt diet -Fluid restriction   Multifocal pneumonia - IV Rocephin and azithromycin - Incentive spirometry - Solu-Medrol 40 mg twice daily - Mucinex for cough  - Bronchodilators - Urine legionella and S. pneumococcal antigen - Follow up blood culture x2, sputum culture   COPD with acute exacerbation ([place]) -see above  Severe sepsis (HCC) Severe sepsis due to COPD exacerbation and possible multifocal pneumonia: Patient meets criteria for severe sepsis with heart rate 109, RR up to 48, lactic acid 2.7, 2.1. -IV antibiotics as above -Check procalcitonin, and trend lactic acid level -IV fluid: Patient received 500 cc normal saline in ED, will not continue IV fluid due to CHF exacerbation.  Elevated troponin - See below  Coronary artery disease CAD and elevated troponin: Troponin is minimally elevated 18, 18.  No chest pain, most likely due to demand ischemia -Aspirin, Zocor, metoprolol  Stroke (cerebrum) (HCC) - Aspirin, Zocor  Hypertension - IV hydralazine as needed -Amlodipine, metoprolol -Patient is also on IV Lasix  Depression with anxiety -  Continue home medications  HLD (hyperlipidemia) - Zocor  AAA (abdominal aortic aneurysm) ([place]) Incidental findings by CT scan -Follow-up with PCP  Chronic kidney disease, stage 3a ([place]) Baseline creatinine 0.84 on 12/13/2020.  Her creatinine is 1.15, slightly worsening. -Follow-up renal function by BMP             DVT ppx: SQ Lovenox  Code Status: DNR per pt and her son  Family Communication:   Yes, patient's  son at bed side.     Disposition Plan:  Anticipate discharge back to previous environment  Consults called:  none  Admission status and Level of care: Progressive:     SDU/inpation         Severity of Illness:  The appropriate patient status for this patient is INPATIENT. Inpatient status is judged to be reasonable and necessary in order to provide the required intensity of service to ensure the patient's safety. The patient's presenting symptoms, physical exam findings, and initial radiographic and laboratory data in the context of their chronic comorbidities is felt to place them at high risk for further clinical deterioration. Furthermore, it is not anticipated that the patient will be medically stable for discharge from the hospital within 2 midnights of admission.   * I certify that at the point of admission it is my clinical judgment that the patient will require inpatient hospital care spanning beyond 2 midnights from the point of admission due to high intensity of service, high risk for further deterioration and high frequency of surveillance required.*       Date of Service 04/24/2022    Ivor [facility]   If 7PM-7AM, please contact night-coverage [email or web address] 03/29/2022, 2:10 PM

## 2022-03-31 NOTE — ED Notes (Signed)
RT at beside at this time

## 2022-03-31 NOTE — ED Provider Notes (Signed)
[facility] Provider Note    Event Date/Time   First MD Initiated Contact with Patient 04/07/2022 [phone]     (approximate)   History   Shortness of Breath   HPI  Ashley [name] is a 88 y.o. female with past medical history of asthma, COPD, CAD, CHF, CVA HTN, PVD, GERD, anemia, anxiety, arthritis, chronic lower extremity edema previous GI bleed presents via EMS for evaluation after patient was reportedly found on the floor of her home by her son following a suspected fall with patient noted by EMS to be in very mild respiratory distress and hypoxic with audible wheezing.  She was transported on 6 L nasal cannula and home albuterol was given.  History is fairly limited from the patient arrival secondary to significant respiratory distress although she states she does not think she was any more short of breath than usual before her fall.  Her son who arrived shortly after patient to the ED was able provide some additional history at bedside.  He confirms that she is DNR.  I also confirmed this with patient.  He states that she seemed to be in her usual state of health when he was on the phone with her yesterday and that she has some chronic shortness of breath with exertion.  She was not complaining of anything else last time he spoke with her yesterday.    Past Medical History:  Diagnosis Date   Anemia    Anxiety    Arthritis    COPD (chronic obstructive pulmonary disease) (HCC)    Coronary artery disease    Edema    feet/ankles   wears support hose   GERD (gastroesophageal reflux disease)    GI (gastrointestinal bleed)    history   Hypertension    Myocardial infarction (HCC)    Peripheral vascular disease (HCC)    Shortness of breath dyspnea      Physical Exam  Triage Vital Signs: ED Triage Vitals  Enc Vitals Group     BP      Pulse      Resp      Temp      Temp src      SpO2      Weight      Height      Head Circumference      Peak Flow       Pain Score      Pain Loc      Pain Edu?      Excl. in [place]?     Most recent vital signs: Vitals:   04/23/2022 0638 03/26/2022 0700  BP:  120/60  Pulse:  (!) 101  Resp:  (!) 39  Temp:    SpO2: 94% 98%    General: Awake, ill-appearing in significant respiratory distress. CV:  Good peripheral perfusion.  Tachycardic.  No audible murmur at this time. Resp:  Significant increased work of breathing with accessory muscle use and bilateral inspiratory and extra wheezing and tachypnea. Abd:  No distention.  Soft throughout.  Distally reducible umbilical hernia. Other:  2+ bilateral radial and PT pulses.  Patient has symmetric grip strength.  He she is oriented to year and month.  Cranial nerves II through XII are grossly intact.  No obvious trauma to the face, head or neck.  There are some tenderness over the T and L-spine but none over the C-spine.  Patient states she has pain in her hips when she flexes the bilateral legs  also seems fairly globally weak does seem symmetric and there is no leg shortening or rotation.  There is no other obvious trauma to the knees or ankles.  Bilateral lower extremity edema.   ED Results / Procedures / Treatments  Labs (all labs ordered are listed, but only abnormal results are displayed) Labs Reviewed  CBC WITH DIFFERENTIAL/PLATELET - Abnormal; Notable for the following components:      Result Value   RBC 5.39 (*)    Hemoglobin 15.3 (*)    HCT 50.2 (*)    All other components within normal limits  BRAIN NATRIURETIC PEPTIDE - Abnormal; Notable for the following components:   B Natriuretic Peptide 354.4 (*)    All other components within normal limits  BLOOD GAS, VENOUS - Abnormal; Notable for the following components:   pO2, Ven 31 (*)    Bicarbonate 30.4 (*)    Acid-Base Excess 3.4 (*)    All other components within normal limits  D-DIMER, QUANTITATIVE - Abnormal; Notable for the following components:   D-Dimer, Quant 2.91 (*)    All other components  within normal limits  LACTIC ACID, PLASMA - Abnormal; Notable for the following components:   Lactic Acid, Venous 2.7 (*)    All other components within normal limits  COMPREHENSIVE METABOLIC PANEL - Abnormal; Notable for the following components:   BUN 37 (*)    Creatinine, Ser 1.15 (*)    Albumin 3.4 (*)    GFR, Estimated 46 (*)    All other components within normal limits  TROPONIN I (HIGH SENSITIVITY) - Abnormal; Notable for the following components:   Troponin I (High Sensitivity) 18 (*)    All other components within normal limits  SARS CORONAVIRUS 2 BY RT PCR  CULTURE, BLOOD (ROUTINE X 2)  CULTURE, BLOOD (ROUTINE X 2)  PROCALCITONIN  URINALYSIS, COMPLETE (UACMP) WITH MICROSCOPIC  LACTIC ACID, PLASMA     EKG  ECG is remarkable for sinus tachycardia with some artifact in lead II and lead III with otherwise unremarkable intervals, normal axis without other clear evidence of acute ischemia or significant arrhythmia.   RADIOLOGY  Chest x-ray my interpretation shows some right-sided mid and lower lobe opacification do not see any rib fractures, pneumothorax or left-sided opacifications.  Also reviewed radiology interpretation negative findings of the proximal region on the right chest edema versus pneumonia.   PROCEDURES:  Critical Care performed: Yes, see critical care procedure note(s)  .Critical Care Performed by: Lucrezia Starch, MD Authorized by: Lucrezia Starch, MD   Critical care provider statement:    Critical care time (minutes):  30   Critical care was necessary to treat or prevent imminent or life-threatening deterioration of the following conditions:  Respiratory failure   Critical care was time spent personally by me on the following activities:  Development of treatment plan with patient or surrogate, discussions with consultants, evaluation of patient's response to treatment, examination of patient, ordering and review of laboratory studies, ordering and  review of radiographic studies, ordering and performing treatments and interventions, pulse oximetry, re-evaluation of patient's condition and review of old charts .1-3 Lead EKG Interpretation Performed by: Lucrezia Starch, MD Authorized by: Lucrezia Starch, MD     Interpretation: non-specific     ECG rate assessment: tachycardic     Rhythm: sinus rhythm     Ectopy: none     Conduction: normal    The patient is on the cardiac monitor to evaluate for evidence of arrhythmia and/or  significant heart rate changes.   MEDICATIONS ORDERED IN ED: Medications  cefTRIAXone (ROCEPHIN) 2 g in sodium chloride 0.9 % 100 mL IVPB (2 g Intravenous New Bag/Given 03/28/2022 0717)  azithromycin (ZITHROMAX) 500 mg in sodium chloride 0.9 % 250 mL IVPB (500 mg Intravenous New Bag/Given 03/27/2022 0720)  lactated ringers infusion (has no administration in time range)  ipratropium-albuterol (DUONEB) 0.5-2.5 (3) MG/3ML nebulizer solution 9 mL (9 mLs Nebulization Given 04/10/2022 0641)  methylPREDNISolone sodium succinate (SOLU-MEDROL) 125 mg/2 mL injection 125 mg (125 mg Intravenous Given 04/02/2022 0653)     IMPRESSION / MDM / ASSESSMENT AND PLAN / ED COURSE  I reviewed the triage vital signs and the nursing notes. Patient's presentation is most consistent with acute presentation with potential threat to life or bodily function.                               Differential diagnosis includes, but is not limited to injuries from her fall including possible intracranial hemorrhage, occult C-spine injury, T or L-spine fracture versus contusion.  Given she reports some burning with urination obtain a UA to assess for possible cystitis.  Patient is hypoxic tachycardic and tachypneic on arrival with audible wheezing on 6 L concerning for acute obstructive airway disease exacerbation.  Certainly possible is precipitated by trauma, rib fracture, pneumonia, CHF, arrhythmia, anemia, metabolic derangements and a PE.  Patient immediately  started on DuoNebs and Solu-Medrol as ordered.  Patient placed on BiPAP with significant treatment and work of breathing resolution of her hypoxia.  VBG with a pH of 7.35 with a PCO2 of 55 and bicarb of 30.4.  Chest x-ray my interpretation shows some right-sided mid and lower lobe opacification do not see any rib fractures, pneumothorax or left-sided opacifications.  Also reviewed radiology interpretation negative findings of the proximal region on the right chest edema versus pneumonia.  CBC shows no leukocytosis and hemoglobin of 15.3 compared to 13.9 a year ago.  Dimer is elevated 2.91.  I will order a CTA to assess for possible PE.  CMP is remarkable for creatinine of 1.15 compared to 0.841-year ago without other significant electrolyte or metabolic derangements.  Lactic acid 2.7.  Troponin elevated at 18.  Procalcitonin elevated at 22.35.  BNP is 354 compared to 339 1 year ago.  Care patient signed over to assuming provider at approximately 0 700.  Plan is to follow-up remaining imaging and labs and admit for respiratory failure.  Given tachycardia and tachypnea with findings of chest x-ray concerning for possible pneumonia and elevated lactic acid and procalcitonin and concern for possible sepsis despite absence of fever and leukocytosis.  Blood cultures ordered and patient started on broad-spectrum antibiotics.  We will plan to gently hydrat given patient is not hypotensive and her heart rate is improved dramatically with BiPAP.  Care patient signed over to assuming provider at approximately 0 700 with plan to follow-up remaining imaging as well as patient's COVID test admit to medicine service.     FINAL CLINICAL IMPRESSION(S) / ED DIAGNOSES   Final diagnoses:  Fall, initial encounter  Acute respiratory failure with hypoxia (HCC)  COPD exacerbation (HCC)  Low back pain, unspecified back pain laterality, unspecified chronicity, unspecified whether sciatica present  Sepsis, due to  unspecified organism, unspecified whether acute organ dysfunction present [facility])  Pneumonia of right lower lobe due to infectious organism     Rx / DC Orders   ED Discharge Orders  None        Note:  This document was prepared using Dragon voice recognition software and may include unintentional dictation errors.   Lucrezia Starch, MD 04/15/2022 [phone]

## 2022-03-31 NOTE — Assessment & Plan Note (Signed)
Baseline creatinine 0.84 on 12/13/2020.  Her creatinine is 1.15, slightly worsening. -Follow-up renal function by BMP

## 2022-03-31 NOTE — ED Notes (Signed)
Pt trialing off bipap at this time. RN provided oral care. Pt is more tachy than previous. Pt o2 sat ranging from 87%-92% on 15l/min via high flow [place]. RN to notify provider.

## 2022-03-31 NOTE — Assessment & Plan Note (Signed)
-   IV hydralazine as needed -Amlodipine, metoprolol -Patient is also on IV Lasix

## 2022-03-31 NOTE — Assessment & Plan Note (Addendum)
See below

## 2022-03-31 NOTE — Assessment & Plan Note (Signed)
-  see above 

## 2022-03-31 NOTE — IPAL (Signed)
   Interdisciplinary Goals of Care Family Meeting   Date carried out: 03/28/2022  Location of the meeting: Bedside  Member's involved: Physician, Bedside Registered Nurse, and Family Member or next of kin  Durable Power of Attorney or acting medical decision maker: son  Discussion: We discussed goals of care for [employer] .   Called to bedside by nurse due to concerns of patient being on BiPAP for going on 12 hours.  Continues to be tachypneic and does not appear to be clinically improving.  She also appears a bit confused.  She is DNR/DNI. Spoke with 2 sons at bedside extensively about outlook if patient does not turnaround with current measures and BiPAP.  They made it clear that she does not want to be resuscitated.  Discussed the plan going forward, starting with ABG to assess her response to treatment thus far.  Discussed alternate oxygen delivery methods and comfort care with morphine.  They stated that they will be here throughout the night.  If she deteriorates they will make a decision on comfort care at that time   Code status: Full DNR  Disposition: Continue current acute care  Time spent for the meeting: 30 minutes    Athena Masse, MD  04/07/2022, 10:34 PM

## 2022-03-31 NOTE — Assessment & Plan Note (Signed)
CAD and elevated troponin: Troponin is minimally elevated 18, 18.  No chest pain, most likely due to demand ischemia -Aspirin, Zocor, metoprolol

## 2022-03-31 NOTE — ED Notes (Signed)
RN notified provider of pt continued condition of tachypnea with no improvement on the Bipap. Pt family notified RN that she is having bright red blood in sputum. RN notified provider of findings. Provider going to consult with respiratory.

## 2022-03-31 NOTE — Assessment & Plan Note (Signed)
Severe sepsis due to COPD exacerbation and possible multifocal pneumonia: Patient meets criteria for severe sepsis with heart rate 109, RR up to 48, lactic acid 2.7, 2.1. -IV antibiotics as above -Check procalcitonin, and trend lactic acid level -IV fluid: Patient received 500 cc normal saline in ED, will not continue IV fluid due to CHF exacerbation.

## 2022-03-31 NOTE — Sepsis Progress Note (Signed)
Code Sepsis protocol being monitored by e[employer]. 

## 2022-03-31 NOTE — ED Triage Notes (Signed)
Pt arrives via [facility] from home. Pt had fallen at sometime during the night, found her face down at home. While getting pt to stretcher,she appeared to be SOB,  she used her own inhaler without relief. RA saturations 80%. 1 albuterol treatment en route. Saturations improved to 93%. 117 hr, 118/64, cbg 92. Hx of asthma

## 2022-03-31 NOTE — ED Provider Notes (Signed)
-----------------------------------------   7:07 AM on 04/17/2022 -----------------------------------------  Blood pressure 108/63, pulse (!) 109, temperature 98.2 F (36.8 C), temperature source Axillary, resp. rate (!) 51, SpO2 94 %.  Assuming care from Dr. Tamala Julian.  In short, Ashley [name] is a 88 y.o. female with a chief complaint of Shortness of Breath .  Refer to the original H&P for additional details.  The current plan of care is to follow-up images for traumatic injury, anticipate admission for respiratory failure.  ----------------------------------------- 10:18 AM on 04/13/2022 ----------------------------------------- CT imaging of head, cervical spine, thoracic spine, and lumbar spine shows no evidence of traumatic injury.  CTA of chest is negative for PE but does show significant multifocal pneumonia.  Patient was started on Rocephin and azithromycin for community-acquired pneumonia, work of breathing appears to be gradually improving with improving respiratory rate, patient maintaining O2 sats on BiPAP and appears to be breathing more comfortably on reassessment.  Patient meets sepsis criteria based off of heart rate and respiratory rate, lactic acid downtrending on recheck.  Case discussed with hospitalist for admission.    Blake Divine, MD 04/11/2022 1020

## 2022-03-31 NOTE — ED Notes (Signed)
Pt placed back on bipap

## 2022-04-01 ENCOUNTER — Encounter: Payer: Self-pay | Admitting: Internal Medicine

## 2022-04-01 DIAGNOSIS — J9621 Acute and chronic respiratory failure with hypoxia: Secondary | ICD-10-CM | POA: Diagnosis not present

## 2022-04-01 DIAGNOSIS — J9601 Acute respiratory failure with hypoxia: Secondary | ICD-10-CM | POA: Diagnosis not present

## 2022-04-01 LAB — ECHOCARDIOGRAM COMPLETE
Area-P 1/2: 3.99 cm2
Height: 62 in
S' Lateral: 2.63 cm
Weight: 3156.99 oz

## 2022-04-01 LAB — CBC
HCT: 43.7 % (ref 36.0–46.0)
Hemoglobin: 13.3 g/dL (ref 12.0–15.0)
MCH: 28.7 pg (ref 26.0–34.0)
MCHC: 30.4 g/dL (ref 30.0–36.0)
MCV: 94.2 fL (ref 80.0–100.0)
Platelets: 173 10*3/uL (ref 150–400)
RBC: 4.64 MIL/uL (ref 3.87–5.11)
RDW: 14.5 % (ref 11.5–15.5)
WBC: 10.6 10*3/uL — ABNORMAL HIGH (ref 4.0–10.5)
nRBC: 0 % (ref 0.0–0.2)

## 2022-04-01 LAB — CBG MONITORING, ED: Glucose-Capillary: 103 mg/dL — ABNORMAL HIGH (ref 70–99)

## 2022-04-01 LAB — BASIC METABOLIC PANEL
Anion gap: 8 (ref 5–15)
BUN: 48 mg/dL — ABNORMAL HIGH (ref 8–23)
CO2: 27 mmol/L (ref 22–32)
Calcium: 8.5 mg/dL — ABNORMAL LOW (ref 8.9–10.3)
Chloride: 108 mmol/L (ref 98–111)
Creatinine, Ser: 1.26 mg/dL — ABNORMAL HIGH (ref 0.44–1.00)
GFR, Estimated: 41 mL/min — ABNORMAL LOW (ref >60)
Glucose, Bld: 95 mg/dL (ref 70–99)
Potassium: 3.7 mmol/L (ref 3.5–5.1)
Sodium: 143 mmol/L (ref 135–145)

## 2022-04-01 LAB — LACTIC ACID, PLASMA: Lactic Acid, Venous: 1.6 mmol/L (ref 0.5–1.9)

## 2022-04-01 LAB — MAGNESIUM: Magnesium: 2.1 mg/dL (ref 1.7–2.4)

## 2022-04-01 MED ORDER — MORPHINE 100MG IN NS 100ML (1MG/ML) PREMIX INFUSION
2.0000 mg/h | INTRAVENOUS | Status: DC
  Administered 2022-04-01 – 2022-04-02 (×2): 0.5 mg/h via INTRAVENOUS
  Administered 2022-04-07: 2 mg/h via INTRAVENOUS
  Filled 2022-04-01 (×3): qty 100

## 2022-04-01 MED ORDER — MORPHINE SULFATE (PF) 2 MG/ML IV SOLN
1.0000 mg | INTRAVENOUS | Status: DC | PRN
  Administered 2022-04-06 – 2022-04-07 (×4): 2 mg via INTRAVENOUS
  Filled 2022-04-01 (×4): qty 1

## 2022-04-01 MED ORDER — ATORVASTATIN CALCIUM 20 MG PO TABS: 20.0000 mg | ORAL_TABLET | Freq: Every day | ORAL | Status: DC

## 2022-04-01 NOTE — Progress Notes (Signed)
Admission profile updated. ?

## 2022-04-01 NOTE — Progress Notes (Signed)
Ashley [name]  IWP:[id number] DOB: 03/12/1931 DOA: 04/15/2022 PCP: Albina Billet, MD    Brief Narrative:  88 year old with a history of HTN, HLD, COPD, stroke, GERD, depression/anxiety, PVD, CAD, GIB, SAH, chronic diastolic congestive heart failure, CKD stage IIIa, and possible early stage dementia who presented to the ED with shortness of breath and a fall.  She was found on the floor at 4:30 AM by her son with no clear significant injury.  She was in respiratory distress/clearly short of breath.  Upon ER arrival she was found to be desaturating to 80% on room air and using accessory muscles for breathing.  BiPAP was initiated.  CT head was without acute findings.  CT of thoracic and lumbar spine were without acute findings.  CTa of the chest was without evidence of PE but did reveal multifocal infiltrates.  Consultants:  None  Goals of Care:  Code Status: DNR   DVT prophylaxis: Lovenox  Interim Hx: Has been tolerating high flow nasal cannula instead of BiPAP since approximately 5:30 this morning.  Remains tachycardic and tachypneic.  Saturations in the low 90s despite 15 L high flow.  Is alert and conversant at time my exam but appears uncomfortable due to tachypnea and admits to being short of breath.  Assessment & Plan:  Acute hypoxic respiratory failure Multifactorial due to pneumonia as well as CHF exacerbation as well as bronchospasm/COPD exacerbation  Multifocal pneumonia Continue empiric antibiotic therapy -given severity and concomitant COPD exacerbation steroids are being dosed  Acute exacerbation chronic diastolic CHF 2+ bilateral lower extremity edema noted at time of admission with JVD -EF 55-60% with grade 1 DD per TTE 2021 -diurese and follow  Acute bronchospastic exacerbation COPD Wheezing appreciated at time of presentation -continue steroid dosing  Severe sepsis meets criteria for severe sepsis with heart rate 109, RR up to 48, lactic acid 2.7  Goals of care I  met with the patient and 3 sons at the bedside at length today -I discussed the serious nature of her illness and the fact that she may very well not survive -DNR/no CODE BLUE status was reconfirmed -given the patient's discomfort I am adding a low-dose continuous morphine drip, but we are not yet transitioning to full comfort care only as we wish to give her another 24/48 hours to see if she will improve -if she does not show significant improvement the patient and family understand that transitioning to comfort focused care would be our next step -there is still room for upward titration of morphine even if ongoing active treatment is to continue if the patient remains severely dyspneic  Coronary artery disease - elevated troponin Troponin is minimally elevated at 18, most likely due to demand ischemia -continue aspirin, Zocor, metoprolol  History of stroke  Continue aspirin, Zocor  Hypertension Blood pressure well controlled at present  Depression with anxiety Continue home medications  HLD (hyperlipidemia) Continue usual dose of Zocor  AAA (abdominal aortic aneurysm)  Incidental findings by CT scan  Chronic kidney disease, stage 3a  Baseline creatinine 0.84 on 12/13/2020 -creatinine 1.15 at presentation and presently worsening  Family Communication:  Disposition:     Objective: Blood pressure 135/77, pulse (!) 111, temperature 99 F (37.2 C), temperature source Axillary, resp. rate (!) 34, height 5' 2" (1.575 m), weight 89.5 kg, SpO2 91 %.  Intake/Output Summary (Last 24 hours) at 04/01/2022 0952 Last data filed at 04/01/2022 0701 Gross per 24 hour  Intake 100 ml  Output 800 ml  Net -700 ml   Filed Weights   03/30/2022 0800  Weight: 89.5 kg    Examination: General: Tachypneic and clearly dyspneic Lungs: Diffuse expiratory wheezing with scattered crackles and all fields Cardiovascular: Tachycardic but regular Abdomen: Nontender, nondistended, soft, bowel sounds positive, no  rebound, no ascites, no appreciable mass Extremities: 1+ edema bilateral lower extremities  CBC: Recent Labs  Lab 04/18/2022 0636 04/01/22 0307  WBC 5.1 10.6*  NEUTROABS 4.6  --   HGB 15.3* 13.3  HCT 50.2* 43.7  MCV 93.1 94.2  PLT 197 570   Basic Metabolic Panel: Recent Labs  Lab 04/01/2022 0636 04/01/22 0307  NA 142 143  K 4.0 3.7  CL 105 108  CO2 27 27  GLUCOSE 97 95  BUN 37* 48*  CREATININE 1.15* 1.26*  CALCIUM 9.5 8.5*  MG  --  2.1   GFR: Estimated Creatinine Clearance: 32.1 mL/min (A) (by C-G formula based on SCr of 1.26 mg/dL (H)).  Liver Function Tests: Recent Labs  Lab 04/14/2022 0636  AST 29  ALT 18  ALKPHOS 58  BILITOT 1.1  PROT 6.8  ALBUMIN 3.4*    Scheduled Meds:  amLODipine  10 mg Oral Daily   aspirin EC  81 mg Oral Daily   enoxaparin (LOVENOX) injection  40 mg Subcutaneous Q24H   furosemide  20 mg Intravenous Q12H   ipratropium-albuterol  3 mL Nebulization Q4H   methylPREDNISolone (SOLU-MEDROL) injection  40 mg Intravenous Q12H   metoprolol succinate  25 mg Oral Daily   multivitamin with minerals  1 tablet Oral Daily   pantoprazole (PROTONIX) IV  40 mg Intravenous Q24H   QUEtiapine  25 mg Oral QHS   simvastatin  40 mg Oral QHS   vitamin B-12  1,000 mcg Oral Daily   Continuous Infusions:  azithromycin (ZITHROMAX) 500 MG IVPB (Vial-Mate [employer]) Stopped (04/01/22 0732)   cefTRIAXone (ROCEPHIN)  IV Stopped (04/01/22 0701)     LOS: 1 day   Cherene Altes, MD [facility] Office  [phone] Pager - Text Page per Shea Evans  If 7PM-7AM, please contact night-coverage per Amion 04/01/2022, 9:52 AM

## 2022-04-01 NOTE — ED Notes (Addendum)
Pt  tolerated sips of water well without couging/ clearing throat at this time

## 2022-04-01 NOTE — ED Notes (Signed)
Informed RN bed assigned 

## 2022-04-01 NOTE — ED Notes (Addendum)
With RT at bedside at this time,  Pt off bipap hooked to high flow O2 at 15lpm/[place].  Oral care done for this  Pt

## 2022-04-01 NOTE — Progress Notes (Signed)
PT placed on 15L HFNC, attempting to assess patients tolerance off BIPAP. RN at bedside and will monitor, RT will be notified of any changes in status.

## 2022-04-01 NOTE — Progress Notes (Signed)
ABG was obtained and resulted, PH 7.39, CO2 53, PO2 76, HCO3 32, SO2 97% on BIPAP 16/8 65%. BIPAP IPAP AND EPAP were titrated for comfort. New settings are 14/7 60% PT tolerating well, with SpO2 of 91-92%.  PT was given a short break off BIPAP to 15L HFNC, maintaining SpO2 between 90-92%, but did not tolerate for long and was put back on BIPAP due to de-saturating to 86%.  PT has significant Hx of COPD/CHF. MD and RN were made aware, given results and changes made.  Heated High Flow O2 Therapy (Optiflow) was suggested for use during the day beginning tomorrow, with BIPAP QHS and PRN as tolerated.

## 2022-04-01 NOTE — Progress Notes (Signed)
Late entry: Called to patient's room approx 30 minutes ago to assess patient secondary to decrease in patient's saturation. Patient was on 15L HFNC, unable to maintain adequate oxygenation with increased wob. Placed patient on bipap. Patient is tolerating well at this time. Will continue to monitor.

## 2022-04-02 DIAGNOSIS — J9621 Acute and chronic respiratory failure with hypoxia: Secondary | ICD-10-CM | POA: Diagnosis not present

## 2022-04-02 LAB — BLOOD GAS, ARTERIAL
Acid-Base Excess: 5.7 mmol/L — ABNORMAL HIGH (ref 0.0–2.0)
Bicarbonate: 32.1 mmol/L — ABNORMAL HIGH (ref 20.0–28.0)
Delivery systems: POSITIVE
Expiratory PAP: 8 cmH2O
FIO2: 65 %
Inspiratory PAP: 16 cmH2O
O2 Saturation: 97.6 %
Patient temperature: 37
pCO2 arterial: 53 mmHg — ABNORMAL HIGH (ref 32–48)
pH, Arterial: 7.39 (ref 7.35–7.45)
pO2, Arterial: 76 mmHg — ABNORMAL LOW (ref 83–108)

## 2022-04-02 LAB — COMPREHENSIVE METABOLIC PANEL
ALT: 17 U/L (ref 0–44)
AST: 15 U/L (ref 15–41)
Albumin: 2.9 g/dL — ABNORMAL LOW (ref 3.5–5.0)
Alkaline Phosphatase: 46 U/L (ref 38–126)
Anion gap: 8 (ref 5–15)
BUN: 72 mg/dL — ABNORMAL HIGH (ref 8–23)
CO2: 29 mmol/L (ref 22–32)
Calcium: 8.8 mg/dL — ABNORMAL LOW (ref 8.9–10.3)
Chloride: 106 mmol/L (ref 98–111)
Creatinine, Ser: 1.28 mg/dL — ABNORMAL HIGH (ref 0.44–1.00)
GFR, Estimated: 40 mL/min — ABNORMAL LOW (ref >60)
Glucose, Bld: 149 mg/dL — ABNORMAL HIGH (ref 70–99)
Potassium: 3.4 mmol/L — ABNORMAL LOW (ref 3.5–5.1)
Sodium: 143 mmol/L (ref 135–145)
Total Bilirubin: 0.5 mg/dL (ref 0.3–1.2)
Total Protein: 6.4 g/dL — ABNORMAL LOW (ref 6.5–8.1)

## 2022-04-02 LAB — CBC
HCT: 41.6 % (ref 36.0–46.0)
Hemoglobin: 12.6 g/dL (ref 12.0–15.0)
MCH: 28.1 pg (ref 26.0–34.0)
MCHC: 30.3 g/dL (ref 30.0–36.0)
MCV: 92.7 fL (ref 80.0–100.0)
Platelets: 160 10*3/uL (ref 150–400)
RBC: 4.49 MIL/uL (ref 3.87–5.11)
RDW: 14.3 % (ref 11.5–15.5)
WBC: 12.4 10*3/uL — ABNORMAL HIGH (ref 4.0–10.5)
nRBC: 0 % (ref 0.0–0.2)

## 2022-04-02 LAB — LEGIONELLA PNEUMOPHILA SEROGP 1 UR AG: L. pneumophila Serogp 1 Ur Ag: NEGATIVE

## 2022-04-02 LAB — MAGNESIUM: Magnesium: 2.3 mg/dL (ref 1.7–2.4)

## 2022-04-02 MED ORDER — ALBUMIN HUMAN 25 % IV SOLN
25.0000 g | Freq: Once | INTRAVENOUS | Status: AC
  Administered 2022-04-02: 25 g via INTRAVENOUS
  Filled 2022-04-02: qty 100

## 2022-04-02 MED ORDER — SALINE SPRAY 0.65 % NA SOLN
1.0000 | NASAL | Status: DC | PRN
  Administered 2022-04-03 – 2022-04-06 (×2): 1 via NASAL
  Filled 2022-04-02: qty 44

## 2022-04-02 MED ORDER — POTASSIUM CHLORIDE CRYS ER 20 MEQ PO TBCR
40.0000 meq | EXTENDED_RELEASE_TABLET | Freq: Once | ORAL | Status: AC
  Administered 2022-04-02: 40 meq via ORAL
  Filled 2022-04-02: qty 2

## 2022-04-02 NOTE — Plan of Care (Signed)
?  Problem: Education: ?Goal: Ability to demonstrate management of disease process will improve ?Outcome: Progressing ?Goal: Ability to verbalize understanding of medication therapies will improve ?Outcome: Progressing ?Goal: Individualized Educational Video(s) ?Outcome: Progressing ?  ?Problem: Activity: ?Goal: Capacity to carry out activities will improve ?Outcome: Progressing ?  ?Problem: Cardiac: ?Goal: Ability to achieve and maintain adequate cardiopulmonary perfusion will improve ?Outcome: Progressing ?  ?Problem: Education: ?Goal: Knowledge of disease or condition will improve ?Outcome: Progressing ?Goal: Knowledge of the prescribed therapeutic regimen will improve ?Outcome: Progressing ?Goal: Individualized Educational Video(s) ?Outcome: Progressing ?  ?Problem: Activity: ?Goal: Ability to tolerate increased activity will improve ?Outcome: Progressing ?Goal: Will verbalize the importance of balancing activity with adequate rest periods ?Outcome: Progressing ?  ?Problem: Respiratory: ?Goal: Ability to maintain a clear airway will improve ?Outcome: Progressing ?Goal: Levels of oxygenation will improve ?Outcome: Progressing ?Goal: Ability to maintain adequate ventilation will improve ?Outcome: Progressing ?  ?Problem: Activity: ?Goal: Ability to tolerate increased activity will improve ?Outcome: Progressing ?  ?Problem: Clinical Measurements: ?Goal: Ability to maintain a body temperature in the normal range will improve ?Outcome: Progressing ?  ?Problem: Respiratory: ?Goal: Ability to maintain adequate ventilation will improve ?Outcome: Progressing ?Goal: Ability to maintain a clear airway will improve ?Outcome: Progressing ?  ?Problem: Education: ?Goal: Knowledge of General Education information will improve ?Description: Including pain rating scale, medication(s)/side effects and non-pharmacologic comfort measures ?Outcome: Progressing ?  ?Problem: Health Behavior/Discharge Planning: ?Goal: Ability to manage  health-related needs will improve ?Outcome: Progressing ?  ?Problem: Clinical Measurements: ?Goal: Ability to maintain clinical measurements within normal limits will improve ?Outcome: Progressing ?Goal: Will remain free from infection ?Outcome: Progressing ?Goal: Diagnostic test results will improve ?Outcome: Progressing ?Goal: Respiratory complications will improve ?Outcome: Progressing ?Goal: Cardiovascular complication will be avoided ?Outcome: Progressing ?  ?Problem: Activity: ?Goal: Risk for activity intolerance will decrease ?Outcome: Progressing ?  ?Problem: Nutrition: ?Goal: Adequate nutrition will be maintained ?Outcome: Progressing ?  ?Problem: Coping: ?Goal: Level of anxiety will decrease ?Outcome: Progressing ?  ?Problem: Elimination: ?Goal: Will not experience complications related to bowel motility ?Outcome: Progressing ?Goal: Will not experience complications related to urinary retention ?Outcome: Progressing ?  ?Problem: Pain Managment: ?Goal: General experience of comfort will improve ?Outcome: Progressing ?  ?Problem: Safety: ?Goal: Ability to remain free from injury will improve ?Outcome: Progressing ?  ?Problem: Skin Integrity: ?Goal: Risk for impaired skin integrity will decrease ?Outcome: Progressing ?  ?

## 2022-04-02 NOTE — Progress Notes (Signed)
REMY DIA  [name] DOB: 1931/08/25 DOA: 04/17/2022 PCP: Albina Billet, MD    Brief Narrative:  88 year old with a history of HTN, HLD, COPD, stroke, GERD, depression/anxiety, PVD, CAD, GIB, SAH, chronic diastolic congestive heart failure, CKD stage IIIa, and possible early stage dementia who presented to the ED with shortness of breath and a fall.  She was found on the floor at 4:30 AM by her son with no clear significant injury.  She was in respiratory distress/clearly short of breath.  Upon ER arrival she was found to be desaturating to 80% on room air and using accessory muscles for breathing.  BiPAP was initiated.  CT head was without acute findings.  CT of thoracic and lumbar spine were without acute findings.  CTa of the chest was without evidence of PE but did reveal multifocal infiltrates.  Consultants:  None  Goals of Care:  Code Status: DNR   DVT prophylaxis: Lovenox  Interim Hx: The patient required a return to BiPAP last night due to persisting desaturations.  Presently afebrile.  Continues to require BiPAP support.  No current tachycardia or tachypnea.  Appears comfortable on [place] high flow nasal cannula at time of my exam.  Is conversant and pleasant though confused.  Assessment & Plan:  Acute hypoxic respiratory failure Multifactorial due to pneumonia as well as CHF exacerbation as well as bronchospasm/COPD exacerbation  Multifocal pneumonia Continue empiric antibiotic therapy -given severity and concomitant COPD exacerbation steroids are being dosed -appears to be holding stable for now  Acute exacerbation chronic diastolic CHF 2+ bilateral lower extremity edema noted at time of admission with JVD -EF 55-60% with grade 1 DD per TTE 2021 -IV Lasix being utilized -net negative approximately 150 cc since admission -continue diuresis without change today  Acute bronchospastic exacerbation COPD Wheezing appreciated at time of presentation -continue steroid dosing  -no wheezing at time of exam today  Severe sepsis meets criteria for severe sepsis with heart rate 109, RR up to 48, lactic acid 2.7  Goals of care I met with the patient and 3 sons at the bedside at length today -I discussed the serious nature of her illness and the fact that she may very well not survive -DNR/no CODE BLUE status was reconfirmed -given the patient's discomfort I am adding a low-dose continuous morphine drip, but we are not yet transitioning to full comfort care only as we wish to give her another 24/48 hours to see if she will improve -if she does not show significant improvement the patient and family understand that transitioning to comfort focused care would be our next step -there is still room for upward titration of morphine even if ongoing active treatment is to continue if the patient remains severely dyspneic  Coronary artery disease - elevated troponin Troponin is minimally elevated at 18, most likely due to demand ischemia -continue aspirin, Zocor, metoprolol  History of stroke  Continue aspirin, Zocor  Hypertension Blood pressure well controlled at present  Depression with anxiety Continue home medications  HLD (hyperlipidemia) Continue usual dose of Zocor  AAA (abdominal aortic aneurysm)  Incidental findings by CT scan  Chronic kidney disease, stage 3a  Baseline creatinine 0.84 on 12/13/2020 -creatinine 1.15 at presentation and presently worsening  Family Communication: I spoke with 2 of her sons at bedside Disposition: Continue supportive care -expect patient to declare herself in regards to her ability to recover within the next 24-48 hours   Objective: Blood pressure 112/61, pulse 87, temperature 98.2 F (  36.8 C), temperature source Axillary, resp. rate 14, height _0  (1.575 m), weight 89.5 kg, SpO2 94 %.  Intake/Output Summary (Last 24 hours) at 04/02/2022 0940 Last data filed at 04/02/2022 2081 Gross per 24 hour  Intake 497.52 ml  Output 300  ml  Net 197.52 ml    Filed Weights   04/06/2022 0800  Weight: 89.5 kg    Examination: General: Tachypnea improved with no outward evidence of dyspnea Lungs: Fine diffuse crackles with no wheezing today Cardiovascular: Tachycardic but regular Abdomen: Nontender, nondistended, soft, bowel sounds positive, no rebound, no ascites, no appreciable mass Extremities: Trace edema bilateral lower extremities  CBC: Recent Labs  Lab 04/16/2022 0636 04/01/22 0307 04/02/22 0818  WBC 5.1 10.6* 12.4*  NEUTROABS 4.6  --   --   HGB 15.3* 13.3 12.6  HCT 50.2* 43.7 41.6  MCV 93.1 94.2 92.7  PLT 197 173 388    Basic Metabolic Panel: Recent Labs  Lab 04/12/2022 0636 04/01/22 0307 04/02/22 0818  NA 142 143 143  K 4.0 3.7 3.4*  CL 105 108 106  CO2 _1 GLUCOSE 97 95 149*  BUN 37* 48* 72*  CREATININE 1.15* 1.26* 1.28*  CALCIUM 9.5 8.5* 8.8*  MG  --  2.1 2.3    GFR: Estimated Creatinine Clearance: 31.6 mL/min (A) (by C-G formula based on SCr of 1.28 mg/dL (H)).  Liver Function Tests: Recent Labs  Lab 04/03/2022 0636 04/02/22 0818  AST 29 15  ALT 18 17  ALKPHOS 58 46  BILITOT 1.1 0.5  PROT 6.8 6.4*  ALBUMIN 3.4* 2.9*     Scheduled Meds:  amLODipine  10 mg Oral Daily   aspirin EC  81 mg Oral Daily   enoxaparin (LOVENOX) injection  40 mg Subcutaneous Q24H   furosemide  20 mg Intravenous Q12H   ipratropium-albuterol  3 mL Nebulization Q4H   methylPREDNISolone (SOLU-MEDROL) injection  40 mg Intravenous Q12H   metoprolol succinate  25 mg Oral Daily   pantoprazole (PROTONIX) IV  40 mg Intravenous Q24H   QUEtiapine  25 mg Oral QHS   vitamin B-12  1,000 mcg Oral Daily   Continuous Infusions:  azithromycin (ZITHROMAX) 500 mg in sodium chloride 0.9 % 250 mL IVPB 500 mg (04/02/22 0647)   cefTRIAXone (ROCEPHIN)  IV 2 g (04/02/22 7195)   morphine 0.5 mg/hr (04/02/22 0200)     LOS: 2 days   Cherene Altes, MD [facility] Office  [phone] Pager - Text Page per  Shea Evans  If 7PM-7AM, please contact night-coverage per Amion 04/02/2022, 9:40 AM

## 2022-04-02 NOTE — Progress Notes (Signed)
Late entry due to downtime;  confirmed with RN that pt, doesn't need USPIV as for the moment, pt. Is not on any vaso pressor and currently using present PIV for other meds. Will consult IV Team once needed.

## 2022-04-02 NOTE — Progress Notes (Signed)
   04/01/22 2000  Assess: MEWS Score  Temp 98.7 F (37.1 C)  BP 112/73  MAP (mmHg) 84  Pulse Rate (!) 103  ECG Heart Rate (!) 103  Resp (!) 33  SpO2 91 %  O2 Device HFNC  O2 Flow Rate (L/min) 10 L/min  Assess: MEWS Score  MEWS Temp 0  MEWS Systolic 0  MEWS Pulse 1  MEWS RR 2  MEWS LOC 0  MEWS Score 3  MEWS Score Color Yellow  Assess: if the MEWS score is Yellow or Red  Were vital signs taken at a resting state? Yes  Focused Assessment No change from prior assessment  Does the patient meet 2 or more of the SIRS criteria? No  MEWS guidelines implemented *See Row Information* Yes  Treat  MEWS Interventions Administered scheduled meds/treatments;Administered prn meds/treatments;Consulted Respiratory Therapy;Escalated (See documentation below)  Pain Scale 0-10  Pain Score 0  Take Vital Signs  Increase Vital Sign Frequency  Yellow: Q 2hr X 2 then Q 4hr X 2, if remains yellow, continue Q 4hrs  Escalate  MEWS: Escalate Yellow: discuss with charge nurse/RN and consider discussing with provider and RRT  Notify: Charge Nurse/RN  Name of Charge Nurse/RN Notified Felicia  Date Charge Nurse/RN Notified 04/01/22  Time Charge Nurse/RN Notified 70  Notify: Provider  Provider Name/Title Sharion Settler, NP  Date Provider Notified 04/01/22  Time Provider Notified 2158  Method of Notification Page  Notification Reason Change in status  Provider response At bedside  Date of Provider Response 04/01/22  Time of Provider Response 2200  Document  Patient Outcome Stabilized after interventions  Progress note created (see row info) Yes  Assess: SIRS CRITERIA  SIRS Temperature  0  SIRS Pulse 1  SIRS Respirations  1  SIRS WBC 0  SIRS Score Sum  2

## 2022-04-03 DIAGNOSIS — J9621 Acute and chronic respiratory failure with hypoxia: Secondary | ICD-10-CM | POA: Diagnosis not present

## 2022-04-03 LAB — BASIC METABOLIC PANEL
Anion gap: 7 (ref 5–15)
BUN: 80 mg/dL — ABNORMAL HIGH (ref 8–23)
CO2: 29 mmol/L (ref 22–32)
Calcium: 9.1 mg/dL (ref 8.9–10.3)
Chloride: 108 mmol/L (ref 98–111)
Creatinine, Ser: 1.15 mg/dL — ABNORMAL HIGH (ref 0.44–1.00)
GFR, Estimated: 46 mL/min — ABNORMAL LOW (ref >60)
Glucose, Bld: 170 mg/dL — ABNORMAL HIGH (ref 70–99)
Potassium: 4 mmol/L (ref 3.5–5.1)
Sodium: 144 mmol/L (ref 135–145)

## 2022-04-03 LAB — CULTURE, BLOOD (ROUTINE X 2): Special Requests: ADEQUATE

## 2022-04-03 LAB — CBC
HCT: 41.1 % (ref 36.0–46.0)
Hemoglobin: 12.4 g/dL (ref 12.0–15.0)
MCH: 28.1 pg (ref 26.0–34.0)
MCHC: 30.2 g/dL (ref 30.0–36.0)
MCV: 93 fL (ref 80.0–100.0)
Platelets: 159 10*3/uL (ref 150–400)
RBC: 4.42 MIL/uL (ref 3.87–5.11)
RDW: 13.9 % (ref 11.5–15.5)
WBC: 11.2 10*3/uL — ABNORMAL HIGH (ref 4.0–10.5)
nRBC: 0.2 % (ref 0.0–0.2)

## 2022-04-03 MED ORDER — IPRATROPIUM-ALBUTEROL 0.5-2.5 (3) MG/3ML IN SOLN
3.0000 mL | Freq: Four times a day (QID) | RESPIRATORY_TRACT | Status: DC
  Administered 2022-04-03 – 2022-04-07 (×17): 3 mL via RESPIRATORY_TRACT
  Filled 2022-04-03 (×18): qty 3

## 2022-04-03 MED ORDER — FUROSEMIDE 10 MG/ML IJ SOLN: 40.0000 mg | Freq: Two times a day (BID) | INTRAMUSCULAR | Status: DC

## 2022-04-03 MED ORDER — FUROSEMIDE 10 MG/ML IJ SOLN
40.0000 mg | Freq: Two times a day (BID) | INTRAMUSCULAR | Status: DC
  Administered 2022-04-03 – 2022-04-06 (×7): 40 mg via INTRAVENOUS
  Filled 2022-04-03 (×7): qty 4

## 2022-04-03 NOTE — Progress Notes (Addendum)
Ashley [name]  ZOX:[id number] DOB: 1930/10/31 DOA: 04/01/2022 PCP: Albina Billet, MD    Brief Narrative:  88 year old with a history of HTN, HLD, COPD, stroke, GERD, depression/anxiety, PVD, CAD, GIB, SAH, chronic diastolic congestive heart failure, CKD stage IIIa, and possible early stage dementia who presented to the ED with shortness of breath and a fall.  She was found on the floor at 4:30 AM by her son with no clear significant injury.  She was in respiratory distress/clearly short of breath.  Upon ER arrival she was found to be desaturating to 80% on room air and using accessory muscles for breathing.  BiPAP was initiated.  CT head was without acute findings.  CT of thoracic and lumbar spine were without acute findings.  CTa of the chest was without evidence of PE but did reveal multifocal infiltrates.  Consultants:  None  Goals of Care:  Code Status: DNR   DVT prophylaxis: Lovenox  Interim Hx: Continues to require frequent titration of oxygen delivery devices to assure control of her dyspnea.  BiPAP utilized last night, and being utilized at the time of my visit to allow patient to rest.  She has been able to tolerate prolonged periods on high flow nasal cannula today.  Remarkably she remains alert quite pleasant and conversant.  The family feels she is comfortable and not in severe distress.  She denies specific complaints.  Assessment & Plan:  Acute hypoxic respiratory failure Multifactorial due to pneumonia as well as CHF exacerbation as well as bronchospasm/COPD exacerbation -continues to require high level oxygen support but consistently at the same level of support for the past 48 hours -it appears the patient is "holding steady" for now -I have spoken to the family about my concern that should she not mount significant improvement over the next 24-36 hours that I suspect she will begin to tire and we will begin to see precipitous decline -they understand that should this occur a  transition to full comfort focused care would be appropriate  Multifocal pneumonia Continue empiric antibiotic therapy -given severity and concomitant COPD exacerbation steroids are being dosed -appears to be holding stable for now - stop steroids after 72hrs of tx and monitor   Acute exacerbation chronic diastolic CHF 2+ bilateral lower extremity edema noted at time of admission with JVD -EF 55-60% with grade 1 DD per TTE 2021 -IV Lasix being utilized -net negative approximately 100 cc since admission -increase diuretic dose today and continue to monitor  Acute bronchospastic exacerbation COPD Wheezing appreciated at time of presentation -wheezing has now resolved -stop steroid after last dose today and monitor  Severe sepsis meets criteria for severe sepsis with heart rate 109, RR up to 48, lactic acid 2.7 in setting of bacterial pneumonia  E. coli bacteremia 1 of 2 blood cultures positive -source unclear -certainly a possibility this represents E. coli pneumonia -UA not consistent with UTI -no focal abdominal symptoms to suggest intra-abdominal infection -continue empiric antibiotic  Goals of care I have met with the patient and her sons multiple times during this admission -we have discussed the serious nature of her illness and the fact that she may very well not survive - DNR/NO CODE BLUE status has been confirmed - we have not yet transitioned to full comfort care as she appears to be showing some signs of stabilization and even slight improvement -she is being dosed with very low-dose continuous morphine to combat dyspnea and hypoxia related anxiety - the patient and family  understand that transitioning to comfort focused care would be our next step if she begins to do show signs of precipitous decline -we would certainly not escalate therapy beyond our current levels and she should not be transferred to the stepdown unit or ICU  Coronary artery disease - elevated troponin Troponin was  minimally elevated at 18, most likely due to demand ischemia -continue aspirin, Zocor, metoprolol  History of stroke  Continue aspirin, Zocor for now   Hypertension Blood pressure has been stable  Depression with anxiety Continue home medications -low-dose morphine drip being utilized to manage dyspnea driven anxiety  HLD (hyperlipidemia) Holding medical therapy for now  AAA (abdominal aortic aneurysm)  Incidental findings by CT scan  AKI on Chronic kidney disease Stage 3a  Baseline creatinine 0.84 on 12/13/2020 - creatinine 1.15 at time of admit, and has increased further to 1.24 during this admit (representing an increase in crt >0.3 in a 48hr period from her baseline of 0.84)  Obesity - Body mass index is 36.09 kg/m.   Family Communication: I spoke with 2 of her sons at bedside again today Disposition: Continue supportive care -expect patient to declare herself in regards to her ability to recover within the next 24-48 hours   Objective: Blood pressure 125/67, pulse 98, temperature 98.8 F (37.1 C), temperature source Axillary, resp. rate 16, height 5' 2"  (1.575 m), weight 89.5 kg, SpO2 94 %.  Intake/Output Summary (Last 24 hours) at 04/03/2022 0934 Last data filed at 04/03/2022 0659 Gross per 24 hour  Intake 750.2 ml  Output 1200 ml  Net -449.8 ml    Filed Weights   04/03/2022 0800  Weight: 89.5 kg    Examination: General: Resting comfortably on BiPAP at the time of my visit with no respiratory distress evident Lungs: Fine diffuse crackles persist with no wheezing Cardiovascular: RRR without murmur Abdomen: NT/ND, soft, BS positive, no rebound Extremities: Trace edema bilateral lower extremities  CBC: Recent Labs  Lab 04/24/2022 0636 04/01/22 0307 04/02/22 0818 04/03/22 0501  WBC 5.1 10.6* 12.4* 11.2*  NEUTROABS 4.6  --   --   --   HGB 15.3* 13.3 12.6 12.4  HCT 50.2* 43.7 41.6 41.1  MCV 93.1 94.2 92.7 93.0  PLT 197 173 160 381    Basic Metabolic  Panel: Recent Labs  Lab 04/01/22 0307 04/02/22 0818 04/03/22 0501  NA 143 143 144  K 3.7 3.4* 4.0  CL 108 106 108  CO2 27 29 29   GLUCOSE 95 149* 170*  BUN 48* 72* 80*  CREATININE 1.26* 1.28* 1.15*  CALCIUM 8.5* 8.8* 9.1  MG 2.1 2.3  --     GFR: Estimated Creatinine Clearance: 35.2 mL/min (A) (by C-G formula based on SCr of 1.15 mg/dL (H)).  Liver Function Tests: Recent Labs  Lab 04/18/2022 0636 04/02/22 0818  AST 29 15  ALT 18 17  ALKPHOS 58 46  BILITOT 1.1 0.5  PROT 6.8 6.4*  ALBUMIN 3.4* 2.9*     Scheduled Meds:  amLODipine  10 mg Oral Daily   aspirin EC  81 mg Oral Daily   enoxaparin (LOVENOX) injection  40 mg Subcutaneous Q24H   furosemide  20 mg Intravenous Q12H   ipratropium-albuterol  3 mL Nebulization Q4H   methylPREDNISolone (SOLU-MEDROL) injection  40 mg Intravenous Q12H   metoprolol succinate  25 mg Oral Daily   pantoprazole (PROTONIX) IV  40 mg Intravenous Q24H   QUEtiapine  25 mg Oral QHS   vitamin B-12  1,000 mcg Oral  Daily   Continuous Infusions:  azithromycin (ZITHROMAX) 500 mg in sodium chloride 0.9 % 250 mL IVPB 250 mL/hr at 04/03/22 0659   cefTRIAXone (ROCEPHIN)  IV 200 mL/hr at 04/03/22 0659   morphine 0.5 mg/hr (04/03/22 0659)     LOS: 3 days   Cherene Altes, MD [facility] Office  [phone] Pager - Text Page per Shea Evans  If 7PM-7AM, please contact night-coverage per Amion 04/03/2022, 9:34 AM

## 2022-04-03 NOTE — Plan of Care (Signed)
?  Problem: Education: ?Goal: Ability to demonstrate management of disease process will improve ?Outcome: Progressing ?Goal: Ability to verbalize understanding of medication therapies will improve ?Outcome: Progressing ?Goal: Individualized Educational Video(s) ?Outcome: Progressing ?  ?Problem: Activity: ?Goal: Capacity to carry out activities will improve ?Outcome: Progressing ?  ?Problem: Cardiac: ?Goal: Ability to achieve and maintain adequate cardiopulmonary perfusion will improve ?Outcome: Progressing ?  ?Problem: Education: ?Goal: Knowledge of disease or condition will improve ?Outcome: Progressing ?Goal: Knowledge of the prescribed therapeutic regimen will improve ?Outcome: Progressing ?Goal: Individualized Educational Video(s) ?Outcome: Progressing ?  ?Problem: Activity: ?Goal: Ability to tolerate increased activity will improve ?Outcome: Progressing ?Goal: Will verbalize the importance of balancing activity with adequate rest periods ?Outcome: Progressing ?  ?Problem: Respiratory: ?Goal: Ability to maintain a clear airway will improve ?Outcome: Progressing ?Goal: Levels of oxygenation will improve ?Outcome: Progressing ?Goal: Ability to maintain adequate ventilation will improve ?Outcome: Progressing ?  ?Problem: Activity: ?Goal: Ability to tolerate increased activity will improve ?Outcome: Progressing ?  ?Problem: Clinical Measurements: ?Goal: Ability to maintain a body temperature in the normal range will improve ?Outcome: Progressing ?  ?Problem: Respiratory: ?Goal: Ability to maintain adequate ventilation will improve ?Outcome: Progressing ?Goal: Ability to maintain a clear airway will improve ?Outcome: Progressing ?  ?Problem: Education: ?Goal: Knowledge of General Education information will improve ?Description: Including pain rating scale, medication(s)/side effects and non-pharmacologic comfort measures ?Outcome: Progressing ?  ?Problem: Health Behavior/Discharge Planning: ?Goal: Ability to manage  health-related needs will improve ?Outcome: Progressing ?  ?Problem: Clinical Measurements: ?Goal: Ability to maintain clinical measurements within normal limits will improve ?Outcome: Progressing ?Goal: Will remain free from infection ?Outcome: Progressing ?Goal: Diagnostic test results will improve ?Outcome: Progressing ?Goal: Respiratory complications will improve ?Outcome: Progressing ?Goal: Cardiovascular complication will be avoided ?Outcome: Progressing ?  ?Problem: Activity: ?Goal: Risk for activity intolerance will decrease ?Outcome: Progressing ?  ?Problem: Nutrition: ?Goal: Adequate nutrition will be maintained ?Outcome: Progressing ?  ?Problem: Coping: ?Goal: Level of anxiety will decrease ?Outcome: Progressing ?  ?Problem: Elimination: ?Goal: Will not experience complications related to bowel motility ?Outcome: Progressing ?Goal: Will not experience complications related to urinary retention ?Outcome: Progressing ?  ?Problem: Pain Managment: ?Goal: General experience of comfort will improve ?Outcome: Progressing ?  ?Problem: Safety: ?Goal: Ability to remain free from injury will improve ?Outcome: Progressing ?  ?Problem: Skin Integrity: ?Goal: Risk for impaired skin integrity will decrease ?Outcome: Progressing ?  ?

## 2022-04-03 NOTE — Care Management Important Message (Signed)
Important Message  Patient Details  Name: Ashley [name] MRN: [id number] Date of Birth: 1931-09-26   Medicare Important Message Given:  Yes     Dannette Barbara 04/03/2022, 11:44 AM

## 2022-04-04 DIAGNOSIS — J9621 Acute and chronic respiratory failure with hypoxia: Secondary | ICD-10-CM | POA: Diagnosis not present

## 2022-04-04 NOTE — Progress Notes (Signed)
Ashley [name]  IOX:[id number] DOB: 02/08/1931 DOA: 04/16/2022 PCP: Albina Billet, MD    Brief Narrative:  88 year old with a history of HTN, HLD, COPD, stroke, GERD, depression/anxiety, PVD, CAD, GIB, SAH, chronic diastolic congestive heart failure, CKD stage IIIa, and possible early stage dementia who presented to the ED with shortness of breath and a fall.  She was found on the floor at 4:30 AM by her son with no clear significant injury.  She was in respiratory distress/clearly short of breath.  Upon ER arrival she was found to be desaturating to 80% on room air and using accessory muscles for breathing.  BiPAP was initiated.  CT head was without acute findings.  CT of thoracic and lumbar spine were without acute findings.  CTa of the chest was without evidence of PE but did reveal multifocal infiltrates.  Consultants:  None  Goals of Care:  Code Status: DNR   DVT prophylaxis: Lovenox  Interim Hx: Continues to require frequent titration of oxygen delivery devices to assure control of her dyspnea.  BiPAP utilized last night, and being utilized at the time of my visit to allow patient to rest.  She has been able to tolerate prolonged periods on high flow nasal cannula today.  Remarkably she remains alert quite pleasant and conversant.  The family feels she is comfortable and not in severe distress.  She denies specific complaints.  Assessment & Plan:  Acute hypoxic respiratory failure Multifactorial due to pneumonia as well as CHF exacerbation as well as bronchospasm/COPD exacerbation -continues to require high level oxygen support but consistently at the same level of support for the past 48 hours -it appears the patient is "holding steady" for now -I have spoken to the family about my concern that should she not mount significant improvement over the next 24-36 hours that I suspect she will begin to tire and we will begin to see precipitous decline -they understand that should this occur a  transition to full comfort focused care would be appropriate  Sepsis, multifactorial, POA Secondary to multifocal pneumonia Rule out Ecoli bacteremia (prelim cultures positive) Continue empiric antibiotic therapy with ceftriaxone Questionable COPD exacerbation  - steroids completed  Acute exacerbation chronic diastolic CHF 2+ bilateral lower extremity edema noted at time of admission with JVD -EF 55-60% with grade 1 DD per TTE 2021 -IV Lasix being utilized -net negative approximately 100 cc since admission -increase diuretic dose today and continue to monitor  Acute bronchospastic exacerbation COPD Wheezing appreciated at time of presentation -wheezing has now resolved -stop steroid after last dose today and monitor  Severe sepsis, POA meets criteria for severe sepsis with heart rate 109, RR up to 48, lactic acid 2.7 in setting of bacterial pneumonia  E. coli bacteremia 1 of 2 blood cultures positive -source unclear -certainly a possibility this represents E. coli pneumonia -UA not consistent with UTI -no focal abdominal symptoms to suggest intra-abdominal infection -continue empiric antibiotic  Goals of care I have met with the patient and her sons multiple times during this admission -we have discussed the serious nature of her illness and the fact that she may very well not survive - DNR/NO CODE BLUE status has been confirmed - we have not yet transitioned to full comfort care as she appears to be showing some signs of stabilization and even slight improvement -she is being dosed with very low-dose continuous morphine to combat dyspnea and hypoxia related anxiety - the patient and family understand that transitioning to comfort  focused care would be our next step if she begins to do show signs of precipitous decline -we would certainly not escalate therapy beyond our current levels and she should not be transferred to the stepdown unit or ICU  Coronary artery disease - with mild acutely  elevated troponin Troponin was minimally elevated at 18, most likely due to demand ischemia secondary to hypoxia/sepsis as above -continue aspirin, Zocor, metoprolol  History of stroke  Continue aspirin, Zocor for now   Hypertension Blood pressure has been stable  Depression with anxiety Continue home medications -low-dose morphine drip being utilized to manage dyspnea driven anxiety  HLD (hyperlipidemia) Holding medical therapy for now  AAA (abdominal aortic aneurysm)  Incidental findings by CT scan  AKI on Chronic kidney disease Stage 3a  Baseline creatinine 0.84 on 12/13/2020 - creatinine 1.15 at time of admit, and has increased further to 1.24 during this admit (representing an increase in crt >0.3 in a 48hr period from her baseline of 0.84)  Obesity - Body mass index is 36.09 kg/m.   Family Communication: I spoke with 2 of her sons at bedside again today Disposition: Continue supportive care -expect patient to declare herself in regards to her ability to recover within the next 24-48 hours   Objective: Blood pressure 120/70, pulse 87, temperature 99.1 F (37.3 C), temperature source Axillary, resp. rate 17, height _0  (1.575 m), weight 89.5 kg, SpO2 93 %.  Intake/Output Summary (Last 24 hours) at 04/04/2022 0745 Last data filed at 04/04/2022 0962 Gross per 24 hour  Intake 637.05 ml  Output 1450 ml  Net -812.95 ml    Filed Weights   03/30/2022 0800  Weight: 89.5 kg    Examination: General: Resting comfortably on HF nasal cannula Lungs: Fine diffuse crackles persist with no wheezing Cardiovascular: RRR without murmur Abdomen: NT/ND, soft, BS positive, no rebound Extremities: Trace edema bilateral lower extremities  CBC: Recent Labs  Lab 04/02/2022 0636 04/01/22 0307 04/02/22 0818 04/03/22 0501  WBC 5.1 10.6* 12.4* 11.2*  NEUTROABS 4.6  --   --   --   HGB 15.3* 13.3 12.6 12.4  HCT 50.2* 43.7 41.6 41.1  MCV 93.1 94.2 92.7 93.0  PLT 197 173 160 159     Basic Metabolic Panel: Recent Labs  Lab 04/01/22 0307 04/02/22 0818 04/03/22 0501  NA 143 143 144  K 3.7 3.4* 4.0  CL 108 106 108  CO2 _1 GLUCOSE 95 149* 170*  BUN 48* 72* 80*  CREATININE 1.26* 1.28* 1.15*  CALCIUM 8.5* 8.8* 9.1  MG 2.1 2.3  --     GFR: Estimated Creatinine Clearance: 35.2 mL/min (A) (by C-G formula based on SCr of 1.15 mg/dL (H)).  Liver Function Tests: Recent Labs  Lab 04/05/2022 0636 04/02/22 0818  AST 29 15  ALT 18 17  ALKPHOS 58 46  BILITOT 1.1 0.5  PROT 6.8 6.4*  ALBUMIN 3.4* 2.9*     Scheduled Meds:  amLODipine  10 mg Oral Daily   aspirin EC  81 mg Oral Daily   enoxaparin (LOVENOX) injection  40 mg Subcutaneous Q24H   furosemide  40 mg Intravenous Q12H   ipratropium-albuterol  3 mL Nebulization Q6H   metoprolol succinate  25 mg Oral Daily   pantoprazole (PROTONIX) IV  40 mg Intravenous Q24H   QUEtiapine  25 mg Oral QHS   vitamin B-12  1,000 mcg Oral Daily   Continuous Infusions:  cefTRIAXone (ROCEPHIN)  IV 2 g (04/04/22 0630)  morphine 0.5 mg/hr (04/04/22 0400)     LOS: 4 days   Holli Humbles DO  If 7PM-7AM, please contact night-coverage per Amion 04/04/2022, 7:45 AM

## 2022-04-04 NOTE — Evaluation (Signed)
Physical Therapy Evaluation Patient Details Name: Ashley [name] MRN: [id number] DOB: [birth year] Today's Date: 04/04/2022  History of Present Illness  88 year old with a history of HTN, HLD, COPD, stroke, GERD, depression/anxiety, PVD, CAD, GIB, SAH, chronic diastolic congestive heart failure, CKD stage IIIa, and possible early stage dementia who presented to the ED with shortness of breath and a fall.  She was found on the floor at 4:30 AM by her son with no clear significant injury.  She was in respiratory distress/clearly short of breath.  Upon ER arrival she was found to be desaturating to 80% on room air and using accessory muscles for breathing.  BiPAP was initiated.  CT head was without acute findings.  CT of thoracic and lumbar spine were without acute findings.  CTa of the chest was without evidence of PE but did reveal multifocal infiltrates.  Clinical Impression  Pt received in bed surrounded by family. Pt agreed to participate in therapy. AS per nurse, pt needs to be evaluated in bed and to keep the activities at bed level only Pt on 13L o2 via [place]. Pt demonstrates edema in BLE and BUE. SPO2 remained in low 90's and 88 % with turning and AROM. Pt and fam educated to perform  PLB.  Pt needed verbal arousal to remain participative. Pt fatigued easilty with minimal movements. Pt needed total care for rolling. Pt not appropriate for complete Bed mob or sitting or transfers at this point. Will benefit form 1 x week PT while in acute care. Discharge recommendation TBD as pt progresses.      Recommendations for follow up therapy are one component of a multi-disciplinary discharge planning process, led by the attending physician.  Recommendations may be updated based on patient status, additional functional criteria and insurance authorization.  Follow Up Recommendations Other (comment) (TBD)    Assistance Recommended at Discharge Frequent or constant Supervision/Assistance  Patient can return  home with the following  Two people to help with walking and/or transfers;Two people to help with bathing/dressing/bathroom;Direct supervision/assist for financial management;Direct supervision/assist for medications management;Help with stairs or ramp for entrance;Assistance with feeding;Assistance with cooking/housework    Equipment Recommendations Rolling walker (2 wheels);BSC/3in1;Hospital bed  Recommendations for Other Services       Functional Status Assessment Patient has had a recent decline in their functional status and demonstrates the ability to make significant improvements in function in a reasonable and predictable amount of time.     Precautions / Restrictions Precautions Precautions: Fall Restrictions Weight Bearing Restrictions: No      Mobility  Bed Mobility Overal bed mobility: Needs Assistance Bed Mobility: Rolling Rolling: Total assist              Transfers                   General transfer comment: Not assessed    Ambulation/Gait                  Stairs            Wheelchair Mobility    Modified Rankin (Stroke Patients Only)       Balance Overall balance assessment: No apparent balance deficits (not formally assessed)                                           Pertinent Vitals/Pain      Home Living  Family/patient expects to be discharged to:: Unsure Living Arrangements: Children                      Prior Function Prior Level of Function : Needs assist       Physical Assist : ADLs (physical) Mobility (physical): Bed mobility;Transfers ADLs (physical): Dressing Mobility Comments: amb with walker in home       Hand Dominance   Dominant Hand: Right    Extremity/Trunk Assessment   Upper Extremity Assessment Upper Extremity Assessment: Generalized weakness    Lower Extremity Assessment Lower Extremity Assessment: Generalized weakness       Communication    Communication: No difficulties  Cognition Arousal/Alertness: Awake/alert Behavior During Therapy: WFL for tasks assessed/performed Overall Cognitive Status: History of cognitive impairments - at baseline Area of Impairment: Orientation, Attention, Awareness, Problem solving, Memory (one step command)                 Orientation Level: Disoriented to, Person, Place, Time, Situation                      General Comments General comments (skin integrity, edema, etc.): Edema in BLE and LUE    Exercises General Exercises - Lower Extremity Ankle Circles/Pumps: 10 reps, AROM, Supine Heel Slides: AAROM, 5 reps, Supine   Assessment/Plan    PT Assessment Patient needs continued PT services  PT Problem List Decreased strength;Decreased activity tolerance;Obesity;Cardiopulmonary status limiting activity       PT Treatment Interventions Therapeutic activities;Therapeutic exercise;Gait training;Balance training;Patient/family education    PT Goals (Current goals can be found in the Care Plan section)  Acute Rehab PT Goals Patient Stated Goal: Unable to formualte a goal PT Goal Formulation: Patient unable to participate in goal setting Time For Goal Achievement: 04/18/22 Potential to Achieve Goals: Fair (guarded)    Frequency Min 1X/week     Co-evaluation               AM-PAC PT "6 Clicks" Mobility  Outcome Measure Help needed turning from your back to your side while in a flat bed without using bedrails?: Total Help needed moving from lying on your back to sitting on the side of a flat bed without using bedrails?: Total Help needed moving to and from a bed to a chair (including a wheelchair)?: Total Help needed standing up from a chair using your arms (e.g., wheelchair or bedside chair)?: Total Help needed to walk in hospital room?: Total Help needed climbing 3-5 steps with a railing? : Total 6 Click Score: 6    End of Session Equipment Utilized During  Treatment: Oxygen Activity Tolerance: Patient limited by fatigue;Treatment limited secondary to medical complications (Comment) Patient left: in bed;with family/visitor present;with call bell/phone within reach Nurse Communication: Mobility status PT Visit Diagnosis: Other abnormalities of gait and mobility (R26.89);Muscle weakness (generalized) (M62.81);History of falling (Z91.81)    Time: 1441-1501 PT Time Calculation (min) (ACUTE ONLY): 20 min   Charges:   PT Evaluation $PT Eval High Complexity: 1 High PT Treatments $Therapeutic Activity: 8-22 mins        Kaylub Detienne PT DPT 3:57 PM,04/04/22   Reyanne Hussar H Nadiya Pieratt 04/04/2022, 3:51 PM

## 2022-04-05 DIAGNOSIS — J9621 Acute and chronic respiratory failure with hypoxia: Secondary | ICD-10-CM | POA: Diagnosis not present

## 2022-04-05 LAB — CBC
HCT: 46.9 % — ABNORMAL HIGH (ref 36.0–46.0)
Hemoglobin: 14.6 g/dL (ref 12.0–15.0)
MCH: 28.1 pg (ref 26.0–34.0)
MCHC: 31.1 g/dL (ref 30.0–36.0)
MCV: 90.4 fL (ref 80.0–100.0)
Platelets: 180 10*3/uL (ref 150–400)
RBC: 5.19 MIL/uL — ABNORMAL HIGH (ref 3.87–5.11)
RDW: 13.6 % (ref 11.5–15.5)
WBC: 10.7 10*3/uL — ABNORMAL HIGH (ref 4.0–10.5)
nRBC: 0 % (ref 0.0–0.2)

## 2022-04-05 LAB — BASIC METABOLIC PANEL
Anion gap: 8 (ref 5–15)
BUN: 65 mg/dL — ABNORMAL HIGH (ref 8–23)
CO2: 33 mmol/L — ABNORMAL HIGH (ref 22–32)
Calcium: 9.5 mg/dL (ref 8.9–10.3)
Chloride: 101 mmol/L (ref 98–111)
Creatinine, Ser: 0.74 mg/dL (ref 0.44–1.00)
GFR, Estimated: >60 mL/min (ref >60)
Glucose, Bld: 115 mg/dL — ABNORMAL HIGH (ref 70–99)
Potassium: 3.6 mmol/L (ref 3.5–5.1)
Sodium: 142 mmol/L (ref 135–145)

## 2022-04-05 LAB — CULTURE, BLOOD (ROUTINE X 2): Culture: NO GROWTH

## 2022-04-05 NOTE — Progress Notes (Addendum)
Patient's MEWs scores ranged from red, yellow and green during my shift due to O2 sat, RR and HR.   Patient has been running sinus tachy all day (100s to 110s).  Patient has a tendency to have increase resp. rate and decrease O2 sats when agitated due BM, uncomfortable position or repositioning.  MD, RN and RT aware of situation.  Will continue to monitor and encourage patient to use IS and take slow deep breaths.

## 2022-04-05 NOTE — Plan of Care (Signed)
  Problem: Education: Goal: Ability to demonstrate management of disease process will improve Outcome: Progressing Goal: Ability to verbalize understanding of medication therapies will improve Outcome: Progressing Goal: Individualized Educational Video(s) Outcome: Progressing   Problem: Activity: Goal: Capacity to carry out activities will improve Outcome: Progressing   Problem: Cardiac: Goal: Ability to achieve and maintain adequate cardiopulmonary perfusion will improve Outcome: Progressing   Problem: Education: Goal: Knowledge of disease or condition will improve Outcome: Progressing Goal: Knowledge of the prescribed therapeutic regimen will improve Outcome: Progressing Goal: Individualized Educational Video(s) Outcome: Progressing   Problem: Respiratory: Goal: Ability to maintain a clear airway will improve Outcome: Progressing Goal: Levels of oxygenation will improve Outcome: Progressing Goal: Ability to maintain adequate ventilation will improve Outcome: Progressing

## 2022-04-05 NOTE — Progress Notes (Signed)
Ashley [name]  KZS:[id number] DOB: [birth year] DOA: 03/26/2022 PCP: Albina Billet, MD    Brief Narrative:  88 year old with a history of HTN, HLD, COPD, stroke, GERD, depression/anxiety, PVD, CAD, GIB, SAH, chronic diastolic congestive heart failure, CKD stage IIIa, and possible early stage dementia who presented to the ED with shortness of breath and a fall.  She was found on the floor at 4:30 AM by her son with no clear significant injury.  She was in respiratory distress/clearly short of breath.  Upon ER arrival she was found to be desaturating to 80% on room air and using accessory muscles for breathing.  BiPAP was initiated.  CT head was without acute findings.  CT of thoracic and lumbar spine were without acute findings.  CTa of the chest was without evidence of PE but did reveal multifocal infiltrates.  Consultants:  None  Goals of Care:  Code Status: DNR   DVT prophylaxis: Lovenox  Interim Hx: Patient slowly improving over the past 48 hours per discussion with family, lengthy discussion, we will discontinue BiPAP as patient is unable to tolerate it without profound anxiety and we discussed this would only be used if patient were to acutely decompensate -patient has already decided that should patient decompensate will transition to comfort measures.  Patient otherwise tolerating p.o. quite well this morning denies nausea vomiting diarrhea constipation headache fevers chills or chest pain.  Shortness of breath weakness and fatigue appear to be ongoing but moderately improving as is her medication over the past few days.  Assessment & Plan:  Acute hypoxic respiratory failure, ongoing - Patient multifactorial due to pneumonia as well as CHF exacerbation vs bronchospasm/COPD exacerbation  - Continues to require high level oxygen support with minimal improvement in oxygen over the past 3 days  - Family understands that should patient decompensate we will transition to comfort  measures. -At this time patient appears to be stabilizing although long-term prognosis and quality of life continues to be a concern with family as patient will likely need to transition to LTAC versus SNF at discharge given her profound hypoxia and ongoing weakness in the setting of above.  Severe sepsis, multifactorial, POA Secondary to multifocal pneumonia Rule out Ecoli bacteremia (prelim cultures positive) Continue empiric antibiotic therapy with ceftriaxone Questionable COPD exacerbation  - steroids completed  Acute exacerbation chronic diastolic CHF -Likely primary contributing factor to above hypoxia  -Profound bilateral lower extremity edema JVD improving over the past 24 to 48 hours with aggressive diuresis  -EF 55-60% with grade 1 DD per TTE 2021 -IV Lasix being utilized -net negative approximately 7L since intake  Acute bronchospastic exacerbation COPD Wheezing appreciated at time of presentation -potentially cardiogenic in nature, continue diuresis, discontinue steroids now that wheezing has discontinued  E. coli bacteremia, POA Potentially secondary to pneumonia versus UTI -pansensitive, continue ceftriaxone - follow clinical course  Goals of care - Lengthy discussion at bedside daily, patient continues to be a DNR  - Remove BiPAP from the room per our discussion today given patient has no longer tolerating it due to anxiety and family would not wish patient to be on BiPAP for any prolonged period of time due to her wishes to avoid ventilation and intubation. -If patient decompensates family is agreeable to transition to comfort measures. -Unclear long-term prognosis given patient's age advanced comorbid conditions and severe findings as above including metabolic encephalopathy, severe sepsis, multiple sources for infection as well as profound heart failure exacerbation it is unclear how much physical  therapy should be able to tolerate in the coming weeks. -Expect patient to  discharge to facility if she continues to improve, if she decompensates as above plan for comfort measures.  Coronary artery disease - with mild acutely elevated troponin - Troponin was minimally elevated at 18, most likely due to demand ischemia secondary to hypoxia/sepsis as above -continue aspirin, Zocor, metoprolol  History of stroke  Continue aspirin, Zocor for now   Hypertension Blood pressure has been stable  Depression with anxiety Continue home medications -low-dose morphine drip being utilized to manage dyspnea driven anxiety  HLD (hyperlipidemia) Holding medical therapy for now  AAA (abdominal aortic aneurysm)  Incidental findings by CT scan  AKI on Chronic kidney disease Stage 3a  Baseline creatinine 0.84 on 12/13/2020 - creatinine 1.15 at time of admit, and has increased further to 1.24 during this admit (representing an increase in crt >0.3 in a 48hr period from her baseline of 0.84)  Obesity - Body mass index is 36.09 kg/m.   Family Communication: At bedside Disposition: Continues to be profoundly hypoxic, disposition likely LTAC versus SNF pending oxygen supplementation/needs  Objective: Blood pressure (!) 156/74, pulse (!) 104, temperature 98.1 F (36.7 C), temperature source Axillary, resp. rate 19, height '5\' 2"'$  (1.575 m), weight 89.5 kg, SpO2 (!) 89 %.  Intake/Output Summary (Last 24 hours) at 04/05/2022 0748 Last data filed at 04/05/2022 0400 Gross per 24 hour  Intake 585.18 ml  Output 2450 ml  Net -1864.82 ml    Filed Weights   04/22/2022 0800  Weight: 89.5 kg    Examination: General: Resting comfortably on HF nasal cannula Lungs: Fine diffuse crackles persist with no wheezing Cardiovascular: RRR without murmur Abdomen: NT/ND, soft, BS positive, no rebound Extremities: Trace edema bilateral lower extremities  CBC: Recent Labs  Lab 04/13/2022 0636 04/01/22 0307 04/02/22 0818 04/03/22 0501 04/05/22 0559  WBC 5.1   < > 12.4* 11.2* 10.7*   NEUTROABS 4.6  --   --   --   --   HGB 15.3*   < > 12.6 12.4 14.6  HCT 50.2*   < > 41.6 41.1 46.9*  MCV 93.1   < > 92.7 93.0 90.4  PLT 197   < > 160 159 180   < > = values in this interval not displayed.    Basic Metabolic Panel: Recent Labs  Lab 04/01/22 0307 04/02/22 0818 04/03/22 0501 04/05/22 0559  NA 143 143 144 142  K 3.7 3.4* 4.0 3.6  CL 108 106 108 101  CO2 '27 29 29 '$ 33*  GLUCOSE 95 149* 170* 115*  BUN 48* 72* 80* 65*  CREATININE 1.26* 1.28* 1.15* 0.74  CALCIUM 8.5* 8.8* 9.1 9.5  MG 2.1 2.3  --   --     GFR: Estimated Creatinine Clearance: 50.6 mL/min (by C-G formula based on SCr of 0.74 mg/dL).  Liver Function Tests: Recent Labs  Lab 04/24/2022 0636 04/02/22 0818  AST 29 15  ALT 18 17  ALKPHOS 58 46  BILITOT 1.1 0.5  PROT 6.8 6.4*  ALBUMIN 3.4* 2.9*     Scheduled Meds:  amLODipine  10 mg Oral Daily   aspirin EC  81 mg Oral Daily   enoxaparin (LOVENOX) injection  40 mg Subcutaneous Q24H   furosemide  40 mg Intravenous Q12H   ipratropium-albuterol  3 mL Nebulization Q6H   metoprolol succinate  25 mg Oral Daily   pantoprazole (PROTONIX) IV  40 mg Intravenous Q24H   QUEtiapine  25 mg Oral  QHS   vitamin B-12  1,000 mcg Oral Daily   Continuous Infusions:  cefTRIAXone (ROCEPHIN)  IV 2 g (04/05/22 9563)   morphine 0.5 mg/hr (04/05/22 0400)     LOS: 5 days   Holli Humbles DO  If 7PM-7AM, please contact night-coverage per Amion 04/05/2022, 7:48 AM

## 2022-04-06 DIAGNOSIS — Z515 Encounter for palliative care: Secondary | ICD-10-CM

## 2022-04-06 DIAGNOSIS — J9601 Acute respiratory failure with hypoxia: Secondary | ICD-10-CM

## 2022-04-06 DIAGNOSIS — I5033 Acute on chronic diastolic (congestive) heart failure: Secondary | ICD-10-CM

## 2022-04-06 DIAGNOSIS — Z66 Do not resuscitate: Secondary | ICD-10-CM

## 2022-04-06 DIAGNOSIS — W19XXXA Unspecified fall, initial encounter: Secondary | ICD-10-CM | POA: Diagnosis not present

## 2022-04-06 DIAGNOSIS — J9621 Acute and chronic respiratory failure with hypoxia: Secondary | ICD-10-CM | POA: Diagnosis not present

## 2022-04-06 MED ORDER — QUETIAPINE FUMARATE 25 MG PO TABS
25.0000 mg | ORAL_TABLET | Freq: Every day | ORAL | Status: DC
  Administered 2022-04-06: 25 mg via ORAL
  Filled 2022-04-06: qty 1

## 2022-04-06 MED ORDER — LORAZEPAM 2 MG/ML IJ SOLN
0.5000 mg | Freq: Four times a day (QID) | INTRAMUSCULAR | Status: DC | PRN
  Administered 2022-04-06 – 2022-04-07 (×2): 0.5 mg via INTRAVENOUS
  Filled 2022-04-06 (×2): qty 1

## 2022-04-06 MED ORDER — ENSURE ENLIVE PO LIQD
237.0000 mL | Freq: Three times a day (TID) | ORAL | Status: DC
  Administered 2022-04-06 (×2): 237 mL via ORAL

## 2022-04-06 MED ORDER — ADULT MULTIVITAMIN W/MINERALS CH
1.0000 | ORAL_TABLET | Freq: Every day | ORAL | Status: DC
  Administered 2022-04-06: 1 via ORAL
  Filled 2022-04-06 (×2): qty 1

## 2022-04-06 MED ORDER — MORPHINE BOLUS VIA INFUSION: 1.0000 mg | INTRAVENOUS | Status: DC | PRN

## 2022-04-06 MED ORDER — SODIUM CHLORIDE 0.9 % IV SOLN
2.0000 g | INTRAVENOUS | Status: DC
  Administered 2022-04-07: 2 g via INTRAVENOUS
  Filled 2022-04-06: qty 20
  Filled 2022-04-06: qty 2

## 2022-04-06 NOTE — Progress Notes (Signed)
Initial Nutrition Assessment  DOCUMENTATION CODES:   Obesity unspecified  INTERVENTION:   -Ensure Enlive po TID, each supplement provides 350 kcal and 20 grams of protein -MVI with minerals daily -Downgrade diet to dysphagia 3 diet (advanced mechanical soft) for ease of intake  NUTRITION DIAGNOSIS:   Inadequate oral intake related to decreased appetite as evidenced by per patient/family report, meal completion < 50%.  GOAL:   Patient will meet greater than or equal to 90% of their needs  MONITOR:   PO intake, Supplement acceptance  REASON FOR ASSESSMENT:   Low Braden    ASSESSMENT:   Pt with history of HTN, HLD, COPD, stroke, GERD, depression/anxiety, PVD, CAD, GIB, SAH, chronic diastolic congestive heart failure, CKD stage IIIa, and possible early stage dementia who presented with shortness of breath and a fall.  Pt admitted with acute hypoxic respiratory failure secondary to pneumonia, CHF, and COPD exacernbation.   Reviewed I/O's: -1.2 L x 24 hours and -4.5 L since admission  UOP: 1.7 L x 24 hours   Spoke with pt and son at bedside. Pt son reports pt with good appetite PTA, usually consuming 3 meals per day. He explains that pt has an aide during the day who help with with meals and he provide her with dinner. Pt consumes a wider variety of foods and meals consist of a meat, starch, and vegetable. Pt shares she also occasionally drinks Ensure at home.   Son shares that intake has decline significantly since admission. Pt has had difficulty with getting dentures to stay in and has been struggling with eating. Pt son chopped up her breakfast this morning and pt seemed to do better with it, however, only consumed about 10% of meal. Documented meal completions 0-60%. Pt is drinking fluids well.  Pt son denies pt has lost weight. Reviewed wt hx; wt has been stable over the past 3 months.  Discussed importance of good meal and supplement intake to promote healing. Pt  amenable to diet downgrade for ease of intake as well as Ensure supplements. RD provided pt with vanilla Ensure per her request.   Palliative care consult pending for goals of care discussions.   Medications reviewed and include vitamin B-12 and lasix.   Labs reviewed: CBGS: 103.   NUTRITION - FOCUSED PHYSICAL EXAM:  Flowsheet Row Most Recent Value  Orbital Region No depletion  Upper Arm Region No depletion  Thoracic and Lumbar Region No depletion  Buccal Region No depletion  Temple Region No depletion  Clavicle Bone Region No depletion  Clavicle and Acromion Bone Region No depletion  Scapular Bone Region No depletion  Dorsal Hand No depletion  Patellar Region No depletion  Anterior Thigh Region No depletion  Posterior Calf Region No depletion  Edema (RD Assessment) Mild  Hair Reviewed  Eyes Reviewed  Mouth Reviewed  Skin Reviewed  Nails Reviewed        Diet Order:   Diet Order             DIET DYS 3 Room service appropriate? Yes; Fluid consistency: Thin  Diet effective now                   EDUCATION NEEDS:   Education needs have been addressed  Skin:  Skin Assessment: Reviewed RN Assessment  Last BM:  04/05/22  Height:   Ht Readings from Last 1 Encounters:  03/27/2022 '5\' 2"'$  (1.575 m)    Weight:   Wt Readings from Last 1 Encounters:  04/10/2022  89.5 kg    Ideal Body Weight:  50 kg  BMI:  Body mass index is 36.09 kg/m.  Estimated Nutritional Needs:   Kcal:  1550-1750  Protein:  85-100 grams  Fluid:  > 1.5 L    Loistine Chance, RD, LDN, [place] Registered Dietitian II Certified Diabetes Care and Education Specialist Please refer to [facility] for RD and/or RD on-call/weekend/after hours pager

## 2022-04-06 NOTE — Consult Note (Signed)
Consultation Note Date: 04/06/2022   Patient Name: Ashley [name]  DOB: [birth year]  MRN: [id number]  Age / Sex: 88 y.o., female  PCP: Ashley Billet, MD Referring Physician: Shelly Coss, MD  Reason for Consultation: Establishing goals of care  HPI/Patient Profile: 88 y.o. female  with past medical history of hypertension, hyperlipidemia, COPD, stroke, GERD,, depression/anxiety, peripheral vascular disease, coronary artery disease, GI bleed, subarachnoid hemorrhage, diastolic congestive heart failure, CKD stage IIIa, possible early dementia who presented to the emergency department with complaint of shortness of breath, fall from home.  On presentation she was in respiratory distress.  Found to be hypoxic on presentation, using accessory muscles.  Patient was put on BiPAP.  CT head, CT thoracic/lumbar spine did not show any acute findings.  CT angiogram of the chest revealed multifocal infiltrates. PMT was consulted given patient's continued decline and need to address [place].   Clinical Assessment and Goals of Care: I have reviewed medical records including EPIC notes, labs and imaging, assessed the patient and then met with patient and her sons Ashley [name] and Ashley [name] to discuss diagnosis prognosis, [facility], EOL wishes, disposition and options.  I introduced Palliative Medicine as specialized medical care for people living with serious illness. It focuses on providing relief from the symptoms and stress of a serious illness. The goal is to improve quality of life for both the patient and the family.  We discussed a brief life review of the patient. Patient was married for over 54 years and is mother of 6 children. Her faith and church is important to her. She says she enjoys sitting on her porch and telling people that pass by "Have a good day. God bless you".   We discussed patient's current illness and what it means in  the larger context of patient's on-going co-morbidities.  Natural disease trajectory and expectations at EOL were discussed. I attempted to elicit values and goals of care important to the patient.  Ashley [name] shares he knows that his mother would not want to live like this.  Ashley [name] shares that he knows his mother is tired.  They both are in agreement to focus on keeping her comfortable and to minimize her pain and suffering.  However, when details of comfort care measures were discussed in detail Ashley [name] shared they are not wanting to stop monitoring, lab work, and treating the treatable.  We reviewed a modified comfort care plan wherein we would treat patient's sundowning, anxiety, breathlessness, increased work of breathing, and pain.  Both Ashley [name] confirmed that patient is a DNR and that if she passes away they would not want to intervene.  Both are in agreement to allow the natural process to occur.    Discussed that use of narcotics and other medications to relieve pain and suffering could likely decrease her heart rate, blood pressure, or respiratory rate -and contribute to her ultimate demise here in the hospital.  Both sons were in agreement that they want to focus on keeping her comfortable.  Neither son  wants to have the cardiac monitoring screen in the room.  We reviewed that comfort measures would allow for the screen to be turned off and we would watch the patient rather than numbers.  However, son shared they want her to continue to be monitored but that they just do no longer want to see this screen.  Ashley [name] said he does not want to see his mother suffer and be in pain the way she was last night.  Ashley [name] stayed with her through the night and endorses she had significant pain with movement, agitation, and sundowning.  Discussed changing some medications to help better manage her pain and sundowning through the night.  Reviewed that these medications will be given as needed and that we  will reevaluate tomorrow morning for consideration of next steps and plan of care moving forward.  After leaving the room, I spoke with patient's nurse Ashley [name] and relayed that adjustments will be made to medications but that comfort measures are not currently in place.  Morphine GTT is to remain in place with ability to bolus from bag.  Seroquel medication time has been changed to earlier in the evening to help address sundowning.  Additionally, Ativan 0.5 mg every 6 hours as needed has been added in case Seroquel and morphine does not adequately address patient's pain and/or agitation.  After conferring with the nurse, I returned to the room to again confirm with patient's sons that we will continue to treat the treatable and attempt to keep patient more comfortable through the night.  Ashley [name] and Ashley [name] are still hoping for a miracle but have a healthy understanding that their mother is not getting better.  Education offered regarding concept specific to human mortality and the limitations of medical interventions to prolong life when the body begins to fail to thrive.   Discussed with patient/family the importance of continued conversation with family and the medical providers regarding overall plan of care and treatment options, ensuring decisions are within the context of the patient's values and GOCs.    Questions and concerns were addressed. The family was encouraged to call with questions or concerns.  I am on service tomorrow and plan to round on the patient then.  Ashley [name] made aware that comfort measures have NOT been initiated but that family is in agreement to be more aggressive with agitation and pain management.  Primary Decision Maker NEXT OF KIN  Code Status/Advance Care Planning: DNR  Prognosis:   < 6 months  Discharge Planning: To Be Determined  Primary Diagnoses: Present on Admission:  Multifocal pneumonia  Coronary artery disease  Stroke (cerebrum) ([place])   Hypertension  HLD (hyperlipidemia)  Depression with anxiety  COPD with acute exacerbation (HCC)  Acute on chronic respiratory failure with hypoxia (HCC)  Severe sepsis (HCC)  AAA (abdominal aortic aneurysm) (HCC)  Chronic kidney disease, stage 3a (HCC)  Elevated troponin  Acute on chronic diastolic CHF (congestive heart failure) ([place])   Physical Exam Vitals and nursing note reviewed.  Constitutional:      General: She is not in acute distress.    Appearance: She is not toxic-appearing.  HENT:     Head: Normocephalic.  Cardiovascular:     Rate and Rhythm: Tachycardia present.  Pulmonary:     Breath sounds: Examination of the left-middle field reveals decreased breath sounds. Examination of the right-lower field reveals decreased breath sounds. Examination of the left-lower field reveals decreased breath sounds. Decreased breath sounds and rhonchi present.  Chest:  Chest wall: No mass.  Abdominal:     Palpations: Abdomen is soft.  Musculoskeletal:     Cervical back: Normal range of motion.     Comments: Generalized weakness  Neurological:     Mental Status: She is alert.     Comments: Oriented to self  Psychiatric:        Mood and Affect: Mood is not anxious.        Behavior: Behavior is not agitated.     Palliative Assessment/Data: 30%     I discussed this patient's plan of care with patient, patient's sons Ashley [name] and Ashley [name], [employer], Ashley [name].  Thank you for this consult. Palliative medicine will continue to follow and assist holistically.   Time Total: 75 minutes Greater than 50%  of this time was spent counseling and coordinating care related to the above assessment and plan.  Signed by: Jordan Hawks, DNP, FNP-BC Palliative Medicine    Please contact Palliative Medicine Team phone at [phone] for questions and concerns.  For individual provider: See Shea Evans

## 2022-04-06 NOTE — Progress Notes (Signed)
PT placed on Optiflo ([place]) for titration of FiO2 and comfort of providing additional flow, without increasing Oxygen.

## 2022-04-06 NOTE — Progress Notes (Signed)
   04/06/22 0742  Assess: MEWS Score  Temp 98 F (36.7 C)  BP 115/62  MAP (mmHg) 77  Pulse Rate (!) 111  Resp (!) 22  SpO2 90 %  O2 Device HFNC (heated)  Assess: MEWS Score  MEWS Temp 0  MEWS Systolic 0  MEWS Pulse 2  MEWS RR 1  MEWS LOC 1  MEWS Score 4  MEWS Score Color Red  Assess: if the MEWS score is Yellow or Red  Were vital signs taken at a resting state? Yes  Focused Assessment No change from prior assessment  Does the patient meet 2 or more of the SIRS criteria? Yes  Does the patient have a confirmed or suspected source of infection? Yes  Provider and Rapid Response Notified? No  MEWS guidelines implemented *See Row Information* Yes  Treat  MEWS Interventions Administered scheduled meds/treatments  Pain Scale Faces  Faces Pain Scale 4  Pain Type Chronic pain  Pain Location Generalized  Pain Orientation Posterior;Anterior  Pain Descriptors / Indicators Aching  Pain Frequency Intermittent (worse with movement)  Pain Onset On-going  Pain Intervention(s) RN made aware  Take Vital Signs  Increase Vital Sign Frequency  Red: Q 1hr X 4 then Q 4hr X 4, if remains red, continue Q 4hrs  Escalate  MEWS: Escalate Red: discuss with charge nurse/RN and provider, consider discussing with RRT  Notify: Charge Nurse/RN  Name of Charge Nurse/RN Notified Linard Millers  Date Charge Nurse/RN Notified 04/06/22  Time Charge Nurse/RN Notified 0800  Document  Patient Outcome Not stable and remains on department  Progress note created (see row info) Yes  Assess: SIRS CRITERIA  SIRS Temperature  0  SIRS Pulse 1  SIRS Respirations  1  SIRS WBC 0  SIRS Score Sum  2

## 2022-04-06 NOTE — Progress Notes (Signed)
PT Cancellation Note  Patient Details Name: Ashley [name] MRN: [id number] DOB: 15-Apr-1931   Cancelled Treatment:    Reason Eval/Treat Not Completed: Medical issues which prohibited therapy;Other (comment). PT has been monitoring vitals via chart review throughout the day. MEWS ranging between 4-5. Pt is now on 30L O2 via [place]; was on 10L yesterday. She is having difficulty maintaining sats >90%. RR and HH also elevated. RN states pt and family have a meeting with palliative care this afternoon. Will hold treatment this date and re-assess for appropriateness at later date.    Patrina Levering PT, DPT

## 2022-04-06 NOTE — Progress Notes (Addendum)
PROGRESS NOTE  Ashley [name]  PJA:[id number] DOB: Mar 12, 1931 DOA: 03/28/2022 PCP: Albina Billet, MD   Brief Narrative: Patient is a 88 year old female with history of hypertension, hyperlipidemia, COPD, stroke, GERD,, depression/anxiety, peripheral vascular disease, coronary artery disease, GI bleed, subarachnoid hemorrhage, diastolic congestive heart failure, CKD stage IIIa, possible early dementia who presented to the emergency department with complaint of shortness of breath, fall from home.  On presentation she was in respiratory distress.  Found to be hypoxic on presentation, using accessory muscles.  Patient was put on BiPAP.  CT head, CT thoracic/lumbar spine did not show any acute findings.  CT angiogram of the chest revealed multifocal infiltrates.  Patient is declining, currently DNR, will consult palliative care.  Assessment & Plan:  Principal Problem:   Acute on chronic respiratory failure with hypoxia (HCC) Active Problems:   Acute on chronic diastolic CHF (congestive heart failure) (HCC)   Multifocal pneumonia   COPD with acute exacerbation (HCC)   Severe sepsis (HCC)   Coronary artery disease   Elevated troponin   Stroke (cerebrum) (HCC)   Hypertension   HLD (hyperlipidemia)   Depression with anxiety   AAA (abdominal aortic aneurysm) (HCC)   Chronic kidney disease, stage 3a ([address])  Acute hypoxic respiratory failure: Presented with a fall, found to be tachypneic, hypoxic on presentation.  CT angiogram showed multifocal infiltrates.  CHF, COPD could also have contributed.  Currently on high flow oxygen.  Severe sepsis/multifocal pneumonia/E. coli bacteremia: Initially started on ceftriaxone, azithromycin.  One of the blood cultures is showing E. coli, pansensitive.  Continue current antibiotics.  Acute on chronic diastolic CHF exacerbation: On presentation ,she was found to have bilateral lower extremity edema, elevated JVD.  Her last echocardiogram March showed EF of 55 to  34%, grade 1 diastolic dysfunction.  Started on IV Lasix which we will continue for now.  COPD exacerbation: Completed course of steroids.  Continue bronchodilators, supplemental oxygen  Coronary artery  disease: Mild elevated troponin with flat trend.  Continue supportive care.  No further work-up.  History of stroke: On aspirin, Zocor  Hypertension: Monitor blood pressure.  Currently stable  Depression/anxiety: Continue home medications  Hyperlipidemia: Takes statin at home.  AKI : Currently kidney function is significantly improved with IV diuresis.  Obesity: BMI 36  Abdominal aortic aneurysm: Incidental finding on CT scan.Infrarenal abdominal aortic aneurysm measures 3.3 cm. Recommend follow-up ultrasound every 3 years  Goals of care: Lengthy discussion held by my colleague with the patient earlier.  CODE STATUS DNR.  BiPAP has been removed and she is currently on high flow oxygen.  If she decompensates, family, patient opted for comfort care.  Patient has poor prognosis.  We will consult palliative care also.           DVT prophylaxis:enoxaparin (LOVENOX) injection 40 mg Start: 03/27/2022 2200     Code Status: DNR  Family Communication: Son at bedside  Patient status:Inpatient  Patient is from :Home  Anticipated discharge to:Not sure   Estimated DC date:Not sure   Consultants: Palliative care  Procedures: None  Antimicrobials:  Anti-infectives (From admission, onward)    Start     Dose/Rate Route Frequency Ordered Stop   04/06/22 1000  cefTRIAXone (ROCEPHIN) 2 g in sodium chloride 0.9 % 100 mL IVPB        2 g 200 mL/hr over 30 Minutes Intravenous Every 24 hours 04/06/22 0940     04/01/22 0700  cefTRIAXone (ROCEPHIN) 2 g in sodium chloride 0.9 % 100  mL IVPB  Status:  Discontinued        2 g 200 mL/hr over 30 Minutes Intravenous Every 24 hours 03/30/2022 1047 04/06/22 0940   04/01/22 0700  azithromycin (ZITHROMAX) 500 mg in sodium chloride 0.9 % 250 mL IVPB         500 mg 250 mL/hr over 60 Minutes Intravenous Every 24 hours 04/11/2022 1047 04/04/22 0734   04/05/2022 0715  cefTRIAXone (ROCEPHIN) 2 g in sodium chloride 0.9 % 100 mL IVPB        2 g 200 mL/hr over 30 Minutes Intravenous  Once 03/27/2022 0709 04/07/2022 0747   04/10/2022 0715  azithromycin (ZITHROMAX) 500 mg in sodium chloride 0.9 % 250 mL IVPB        500 mg 250 mL/hr over 60 Minutes Intravenous  Once 03/27/2022 0709 04/17/2022 0820       Subjective: Patient seen and examined at bedside this morning.  She was saturating in the range of 80s even on high flow.  Not in respiratory distress.  Son at the bedside.  Very deconditioned, ill looking, lying in bed  Objective: Vitals:   04/06/22 0742 04/06/22 0835 04/06/22 0900 04/06/22 1000  BP: 115/62 121/70 131/63 (!) 102/52  Pulse: (!) 111 (!) 104 (!) 114 (!) 111  Resp: (!) 22 (!) 22 (!) 23 (!) 29  Temp: 98 F (36.7 C) 98 F (36.7 C) 98.2 F (36.8 C) 99.1 F (37.3 C)  TempSrc:  Oral Oral Axillary  SpO2: 90% (!) 89% (!) 87% (!) 87%  Weight:      Height:        Intake/Output Summary (Last 24 hours) at 04/06/2022 1107 Last data filed at 04/06/2022 1042 Gross per 24 hour  Intake 240 ml  Output 1700 ml  Net -1460 ml   Filed Weights   03/28/2022 0800  Weight: 89.5 kg    Examination:  General exam: Very deconditioned, pleasant elderly female HEENT: PERRL Respiratory system: Diminished sounds bilaterally  cardiovascular system: S1 & S2 heard, RRR.  Gastrointestinal system: Abdomen is nondistended, soft and nontender. Central nervous system: Alert and awake Extremities: No edema, no clubbing ,no cyanosis Skin: No rashes, no ulcers,no icterus     Data Reviewed: I have personally reviewed following labs and imaging studies  CBC: Recent Labs  Lab 03/29/2022 0636 04/01/22 0307 04/02/22 0818 04/03/22 0501 04/05/22 0559  WBC 5.1 10.6* 12.4* 11.2* 10.7*  NEUTROABS 4.6  --   --   --   --   HGB 15.3* 13.3 12.6 12.4 14.6  HCT 50.2* 43.7  41.6 41.1 46.9*  MCV 93.1 94.2 92.7 93.0 90.4  PLT 197 173 160 159 578   Basic Metabolic Panel: Recent Labs  Lab 04/04/2022 0636 04/01/22 0307 04/02/22 0818 04/03/22 0501 04/05/22 0559  NA 142 143 143 144 142  K 4.0 3.7 3.4* 4.0 3.6  CL 105 108 106 108 101  CO2 '27 27 29 29 '$ 33*  GLUCOSE 97 95 149* 170* 115*  BUN 37* 48* 72* 80* 65*  CREATININE 1.15* 1.26* 1.28* 1.15* 0.74  CALCIUM 9.5 8.5* 8.8* 9.1 9.5  MG  --  2.1 2.3  --   --      Recent Results (from the past 240 hour(s))  SARS Coronavirus 2 by RT PCR (hospital order, performed in [facility] hospital lab) *cepheid single result test* Anterior Nasal Swab     Status: None   Collection Time: 04/07/2022  6:36 AM   Specimen: Anterior Nasal Swab  Result  Value Ref Range Status   SARS Coronavirus 2 by RT PCR NEGATIVE NEGATIVE Final    Comment: Performed at [facility], [address] [place], [place] [address]  Blood culture (routine x 2)     Status: Abnormal   Collection Time: 04/12/2022  6:40 AM   Specimen: BLOOD  Result Value Ref Range Status   Specimen Description   Final    BLOOD LEFT ANTECUBITAL Performed at [facility], [address] [place], [place] [address]    Special Requests   Final    BOTTLES DRAWN AEROBIC AND ANAEROBIC Blood Culture adequate volume Performed at [facility], [address] [place], [place] [address]    Culture  Setup Time   Final    Organism ID to follow GRAM NEGATIVE RODS ANAEROBIC BOTTLE ONLY CRITICAL RESULT CALLED TO, READ BACK BY AND VERIFIED WITH: BRANDON BEERS AT 1917 ON 04/06/2022 BY SS GRAM STAIN REVIEWED-AGREE WITH RESULT Performed at [facility], [place] [address], [place], [place] [address]    Culture ESCHERICHIA COLI (A)  Final   Report Status 04/03/2022 FINAL  Final   Organism ID, Bacteria ESCHERICHIA COLI  Final      Susceptibility   Escherichia coli - MIC*    AMPICILLIN <=2 SENSITIVE Sensitive     CEFAZOLIN <=4 SENSITIVE Sensitive      CEFEPIME <=0.12 SENSITIVE Sensitive     CEFTAZIDIME <=1 SENSITIVE Sensitive     CEFTRIAXONE <=0.25 SENSITIVE Sensitive     CIPROFLOXACIN <=0.25 SENSITIVE Sensitive     GENTAMICIN <=1 SENSITIVE Sensitive     IMIPENEM <=0.25 SENSITIVE Sensitive     TRIMETH/SULFA <=20 SENSITIVE Sensitive     AMPICILLIN/SULBACTAM <=2 SENSITIVE Sensitive     PIP/TAZO <=4 SENSITIVE Sensitive     * ESCHERICHIA COLI  Blood culture (routine x 2)     Status: None   Collection Time: 03/29/2022  6:40 AM   Specimen: BLOOD  Result Value Ref Range Status   Specimen Description BLOOD BLOOD RIGHT HAND  Final   Special Requests   Final    BOTTLES DRAWN AEROBIC AND ANAEROBIC Blood Culture results may not be optimal due to an inadequate volume of blood received in culture bottles   Culture   Final    NO GROWTH 5 DAYS Performed at [facility], [address] [place], [place] [address]    Report Status 04/05/2022 FINAL  Final  Blood Culture ID Panel (Reflexed)     Status: Abnormal   Collection Time: 04/13/2022  6:40 AM  Result Value Ref Range Status   Enterococcus faecalis NOT DETECTED NOT DETECTED Final   Enterococcus Faecium NOT DETECTED NOT DETECTED Final   Listeria monocytogenes NOT DETECTED NOT DETECTED Final   Staphylococcus species NOT DETECTED NOT DETECTED Final   Staphylococcus aureus (BCID) NOT DETECTED NOT DETECTED Final   Staphylococcus epidermidis NOT DETECTED NOT DETECTED Final   Staphylococcus lugdunensis NOT DETECTED NOT DETECTED Final   Streptococcus species NOT DETECTED NOT DETECTED Final   Streptococcus agalactiae NOT DETECTED NOT DETECTED Final   Streptococcus pneumoniae NOT DETECTED NOT DETECTED Final   Streptococcus pyogenes NOT DETECTED NOT DETECTED Final   A.calcoaceticus-baumannii NOT DETECTED NOT DETECTED Final   Bacteroides fragilis NOT DETECTED NOT DETECTED Final   Enterobacterales DETECTED (A) NOT DETECTED Final    Comment: Enterobacterales represent a large order of gram  negative bacteria, not a single organism. CRITICAL RESULT CALLED TO, READ BACK BY AND VERIFIED WITH: BRANDON BEERS AT 1917  ON 03/26/2022 BY SS    Enterobacter cloacae complex NOT DETECTED NOT DETECTED Final   Escherichia coli DETECTED (A) NOT DETECTED Final    Comment: CRITICAL RESULT CALLED TO, READ BACK BY AND VERIFIED WITH: BRANDON BEERS AT 1917 ON 04/10/2022 BY SS    Klebsiella aerogenes NOT DETECTED NOT DETECTED Final   Klebsiella oxytoca NOT DETECTED NOT DETECTED Final   Klebsiella pneumoniae NOT DETECTED NOT DETECTED Final   Proteus species NOT DETECTED NOT DETECTED Final   Salmonella species NOT DETECTED NOT DETECTED Final   Serratia marcescens NOT DETECTED NOT DETECTED Final   Haemophilus influenzae NOT DETECTED NOT DETECTED Final   Neisseria meningitidis NOT DETECTED NOT DETECTED Final   Pseudomonas aeruginosa NOT DETECTED NOT DETECTED Final   Stenotrophomonas maltophilia NOT DETECTED NOT DETECTED Final   Candida albicans NOT DETECTED NOT DETECTED Final   Candida auris NOT DETECTED NOT DETECTED Final   Candida glabrata NOT DETECTED NOT DETECTED Final   Candida krusei NOT DETECTED NOT DETECTED Final   Candida parapsilosis NOT DETECTED NOT DETECTED Final   Candida tropicalis NOT DETECTED NOT DETECTED Final   Cryptococcus neoformans/gattii NOT DETECTED NOT DETECTED Final   CTX-M ESBL NOT DETECTED NOT DETECTED Final   Carbapenem resistance IMP NOT DETECTED NOT DETECTED Final   Carbapenem resistance KPC NOT DETECTED NOT DETECTED Final   Carbapenem resistance NDM NOT DETECTED NOT DETECTED Final   Carbapenem resist OXA 48 LIKE NOT DETECTED NOT DETECTED Final   Carbapenem resistance VIM NOT DETECTED NOT DETECTED Final    Comment: Performed at [facility], [address], [place], [place] [address]     Radiology Studies: No results found.  Scheduled Meds:  amLODipine  10 mg Oral Daily   aspirin EC  81 mg Oral Daily   enoxaparin (LOVENOX) injection  40 mg Subcutaneous Q24H    feeding supplement  237 mL Oral TID BM   furosemide  40 mg Intravenous Q12H   ipratropium-albuterol  3 mL Nebulization Q6H   metoprolol succinate  25 mg Oral Daily   multivitamin with minerals  1 tablet Oral Daily   pantoprazole (PROTONIX) IV  40 mg Intravenous Q24H   QUEtiapine  25 mg Oral QHS   vitamin B-12  1,000 mcg Oral Daily   Continuous Infusions:  cefTRIAXone (ROCEPHIN)  IV     morphine 0.5 mg/hr (04/05/22 0400)     LOS: 6 days   Shelly Coss, MD [facility] P6/09/2022, 11:07 AM

## 2022-04-07 DIAGNOSIS — I5033 Acute on chronic diastolic (congestive) heart failure: Secondary | ICD-10-CM | POA: Diagnosis not present

## 2022-04-07 DIAGNOSIS — J9621 Acute and chronic respiratory failure with hypoxia: Secondary | ICD-10-CM | POA: Diagnosis not present

## 2022-04-07 DIAGNOSIS — N1831 Chronic kidney disease, stage 3a: Secondary | ICD-10-CM | POA: Diagnosis not present

## 2022-04-07 DIAGNOSIS — J441 Chronic obstructive pulmonary disease with (acute) exacerbation: Secondary | ICD-10-CM | POA: Diagnosis not present

## 2022-04-07 DIAGNOSIS — I639 Cerebral infarction, unspecified: Secondary | ICD-10-CM

## 2022-04-07 LAB — CBC
HCT: 39.7 % (ref 36.0–46.0)
Hemoglobin: 12.2 g/dL (ref 12.0–15.0)
MCH: 28 pg (ref 26.0–34.0)
MCHC: 30.7 g/dL (ref 30.0–36.0)
MCV: 91.1 fL (ref 80.0–100.0)
Platelets: 181 10*3/uL (ref 150–400)
RBC: 4.36 MIL/uL (ref 3.87–5.11)
RDW: 14.2 % (ref 11.5–15.5)
WBC: 10.2 10*3/uL (ref 4.0–10.5)
nRBC: 0 % (ref 0.0–0.2)

## 2022-04-07 LAB — BASIC METABOLIC PANEL
Anion gap: 5 (ref 5–15)
BUN: 41 mg/dL — ABNORMAL HIGH (ref 8–23)
CO2: 37 mmol/L — ABNORMAL HIGH (ref 22–32)
Calcium: 8.7 mg/dL — ABNORMAL LOW (ref 8.9–10.3)
Chloride: 103 mmol/L (ref 98–111)
Creatinine, Ser: 0.85 mg/dL (ref 0.44–1.00)
GFR, Estimated: >60 mL/min (ref >60)
Glucose, Bld: 143 mg/dL — ABNORMAL HIGH (ref 70–99)
Potassium: 3.1 mmol/L — ABNORMAL LOW (ref 3.5–5.1)
Sodium: 145 mmol/L (ref 135–145)

## 2022-04-07 LAB — BRAIN NATRIURETIC PEPTIDE: B Natriuretic Peptide: 159.9 pg/mL — ABNORMAL HIGH (ref 0.0–100.0)

## 2022-04-07 MED ORDER — FUROSEMIDE 40 MG PO TABS
40.0000 mg | ORAL_TABLET | Freq: Every day | ORAL | Status: DC
  Filled 2022-04-07: qty 1

## 2022-04-07 MED ORDER — GLYCOPYRROLATE 0.2 MG/ML IJ SOLN
0.2000 mg | INTRAMUSCULAR | Status: DC | PRN
  Filled 2022-04-07 (×2): qty 1

## 2022-04-07 MED ORDER — POTASSIUM CHLORIDE 10 MEQ/100ML IV SOLN
10.0000 meq | INTRAVENOUS | Status: AC
  Administered 2022-04-07 (×5): 10 meq via INTRAVENOUS
  Filled 2022-04-07 (×5): qty 100

## 2022-04-07 MED ORDER — GLYCOPYRROLATE 1 MG PO TABS
1.0000 mg | ORAL_TABLET | ORAL | Status: DC | PRN
  Filled 2022-04-07: qty 1

## 2022-04-07 MED ORDER — HALOPERIDOL LACTATE 5 MG/ML IJ SOLN
0.5000 mg | INTRAMUSCULAR | Status: DC | PRN
  Administered 2022-04-07 – 2022-04-08 (×3): 0.5 mg via INTRAVENOUS
  Filled 2022-04-07 (×3): qty 1

## 2022-04-07 MED ORDER — HALOPERIDOL 0.5 MG PO TABS
0.5000 mg | ORAL_TABLET | ORAL | Status: DC | PRN
  Filled 2022-04-07: qty 1

## 2022-04-07 MED ORDER — HALOPERIDOL LACTATE 2 MG/ML PO CONC
0.5000 mg | ORAL | Status: DC | PRN
  Filled 2022-04-07: qty 5

## 2022-04-07 MED ORDER — ACETAMINOPHEN 325 MG PO TABS: 650.0000 mg | ORAL_TABLET | Freq: Four times a day (QID) | ORAL | Status: DC | PRN

## 2022-04-07 MED ORDER — POTASSIUM CHLORIDE 20 MEQ PO PACK
40.0000 meq | PACK | Freq: Once | ORAL | Status: DC
Start: 2022-04-07 — End: 2022-04-07

## 2022-04-07 MED ORDER — LORAZEPAM 2 MG/ML IJ SOLN
1.0000 mg | INTRAMUSCULAR | Status: DC | PRN
Start: 2022-04-07 — End: 2022-04-08
  Administered 2022-04-07 – 2022-04-08 (×3): 1 mg via INTRAVENOUS
  Filled 2022-04-07 (×3): qty 1

## 2022-04-07 MED ORDER — BIOTENE DRY MOUTH MT LIQD: 15.0000 mL | OROMUCOSAL | Status: DC | PRN

## 2022-04-07 MED ORDER — MORPHINE BOLUS VIA INFUSION
1.0000 mg | INTRAVENOUS | Status: DC | PRN
  Administered 2022-04-07 – 2022-04-08 (×4): 1 mg via INTRAVENOUS

## 2022-04-07 MED ORDER — ACETAMINOPHEN 650 MG RE SUPP: 650.0000 mg | Freq: Four times a day (QID) | RECTAL | Status: DC | PRN

## 2022-04-07 MED ORDER — GLYCOPYRROLATE 0.2 MG/ML IJ SOLN
0.2000 mg | INTRAMUSCULAR | Status: DC | PRN
  Administered 2022-04-07 – 2022-04-08 (×4): 0.2 mg via INTRAVENOUS
  Filled 2022-04-07 (×7): qty 1

## 2022-04-07 NOTE — Progress Notes (Signed)
PT Cancellation Note  Patient Details Name: [name] MRN: [id number] DOB: 1931-07-04   Cancelled Treatment:    Reason Eval/Treat Not Completed: Other (comment): Per conversation with MD PT orders to be completed at this time.  Will reassess pt pending a change in status upon receipt of new PT orders.   Linus Salmons PT, DPT 04/07/22, 9:26 AM

## 2022-04-07 NOTE — Progress Notes (Signed)
PROGRESS NOTE  Ashley [name]  FHQ:[id number] DOB: 12-Oct-1931 DOA: 04/24/2022 PCP: Albina Billet, MD   Brief Narrative: Patient is a 88 year old female with history of hypertension, hyperlipidemia, COPD, stroke, GERD,, depression/anxiety, peripheral vascular disease, coronary artery disease, GI bleed, subarachnoid hemorrhage, diastolic congestive heart failure, CKD stage IIIa, possible early dementia who presented to the emergency department with complaint of shortness of breath, fall from home.  On presentation she was in respiratory distress.  Found to be hypoxic on presentation, using accessory muscles.  Patient was put on BiPAP.  CT head, CT thoracic/lumbar spine did not show any acute findings.  CT angiogram of the chest revealed multifocal infiltrates.  Patient continues to decline requiring high flow oxygen.  Palliative care consulted.  Now started on comfort care  Assessment & Plan:  Principal Problem:   Acute on chronic respiratory failure with hypoxia (HCC) Active Problems:   Acute on chronic diastolic CHF (congestive heart failure) (HCC)   Multifocal pneumonia   COPD with acute exacerbation (HCC)   Severe sepsis (HCC)   Coronary artery disease   Elevated troponin   Stroke (cerebrum) (HCC)   Hypertension   HLD (hyperlipidemia)   Depression with anxiety   AAA (abdominal aortic aneurysm) (HCC)   Chronic kidney disease, stage 3a ([place])  Acute hypoxic respiratory failure: Presented with a fall, found to be tachypneic, hypoxic on presentation.  CT angiogram showed multifocal infiltrates.  CHF, COPD could also have contributed.  Currently on high flow oxygen.  Severe sepsis/multifocal pneumonia/E. coli bacteremia: Initially started on ceftriaxone, azithromycin.  One of the blood cultures is showing E. coli, pansensitive.  Now on comfort care  Acute on chronic diastolic CHF exacerbation: On presentation ,she was found to have bilateral lower extremity edema, elevated JVD.  Her last  echocardiogram March showed EF of 55 to 49%, grade 1 diastolic dysfunction.  She was on IV Lasix, now on comfort care.  COPD exacerbation: Completed course of steroids.    Coronary artery  disease: Mild elevated troponin with flat trend.  Continue supportive care.  No further work-up.  History of stroke: On aspirin, Zocor at home  Hypertension: Currently normotensive  Hyperlipidemia: Takes statin at home.  AKI : Currently kidney function has significantly improved with IV diuresis.  Obesity: BMI 36  Abdominal aortic aneurysm: Incidental finding on CT scan.Infrarenal abdominal aortic aneurysm measures 3.3 cm.  Goals of care: Lengthy discussion held at bedside with the son.  Palliative care consulted.  After extensive discussion, family agreeable for comfort care.  They want to continue supplemental oxygen though.     Nutrition Problem: Inadequate oral intake Etiology: decreased appetite    DVT prophylaxis:enoxaparin (LOVENOX) injection 40 mg Start: 04/13/2022 2200     Code Status: DNR  Family Communication: Son at bedside  Patient status:Inpatient  Patient is from :Home  Anticipated discharge to:Anticipate hospital death    Consultants: Palliative care  Procedures: None  Antimicrobials:  Anti-infectives (From admission, onward)    Start     Dose/Rate Route Frequency Ordered Stop   04/06/22 1000  cefTRIAXone (ROCEPHIN) 2 g in sodium chloride 0.9 % 100 mL IVPB        2 g 200 mL/hr over 30 Minutes Intravenous Every 24 hours 04/06/22 0940     04/01/22 0700  cefTRIAXone (ROCEPHIN) 2 g in sodium chloride 0.9 % 100 mL IVPB  Status:  Discontinued        2 g 200 mL/hr over 30 Minutes Intravenous Every 24 hours 04/16/2022 1047  04/06/22 0940   04/01/22 0700  azithromycin (ZITHROMAX) 500 mg in sodium chloride 0.9 % 250 mL IVPB        500 mg 250 mL/hr over 60 Minutes Intravenous Every 24 hours 04/24/2022 1047 04/04/22 0734   04/11/2022 0715  cefTRIAXone (ROCEPHIN) 2 g in sodium  chloride 0.9 % 100 mL IVPB        2 g 200 mL/hr over 30 Minutes Intravenous  Once 04/02/2022 0709 03/28/2022 0747   04/22/2022 0715  azithromycin (ZITHROMAX) 500 mg in sodium chloride 0.9 % 250 mL IVPB        500 mg 250 mL/hr over 60 Minutes Intravenous  Once 03/29/2022 0709 04/23/2022 0820       Subjective: Patient seen and examined at the bedside this morning.  She was unresponsive during my evaluation today.  She was agitated last night so she was given Ativan, after that she became knocked out.  Family at the bedside.  Had a long discussion again about current situation.  They were leaning towards comfort care.  Objective: Vitals:   04/07/22 0000 04/07/22 0400 04/07/22 0830 04/07/22 1113  BP: (!) 122/50 (!) 98/47 (!) 144/74 138/68  Pulse: (!) 116 93 99 (!) 101  Resp: (!) 30 (!) 28 (!) 23 18  Temp:   97.7 F (36.5 C) (!) 96.3 F (35.7 C)  TempSrc:   Axillary   SpO2: (!) 86% 90% 96% 93%  Weight:      Height:        Intake/Output Summary (Last 24 hours) at 04/07/2022 1252 Last data filed at 04/07/2022 0700 Gross per 24 hour  Intake 265.5 ml  Output 1000 ml  Net -734.5 ml   Filed Weights   04/24/2022 0800  Weight: 89.5 kg    Examination:   General exam: Lying in bed, obtunded Respiratory system: Diminished sounds bilaterally Cardiovascular system: S1 & S2 heard, RRR.  Gastrointestinal system: Abdomen is nondistended, soft and nontender. Central nervous system: Not alert or oriented Extremities: No edema, no clubbing ,no cyanosis Skin: No rashes, no ulcers,no icterus     Data Reviewed: I have personally reviewed following labs and imaging studies  CBC: Recent Labs  Lab 04/01/22 0307 04/02/22 0818 04/03/22 0501 04/05/22 0559 04/07/22 0530  WBC 10.6* 12.4* 11.2* 10.7* 10.2  HGB 13.3 12.6 12.4 14.6 12.2  HCT 43.7 41.6 41.1 46.9* 39.7  MCV 94.2 92.7 93.0 90.4 91.1  PLT 173 160 159 180 948   Basic Metabolic Panel: Recent Labs  Lab 04/01/22 0307 04/02/22 0818  04/03/22 0501 04/05/22 0559 04/07/22 0530  NA 143 143 144 142 145  K 3.7 3.4* 4.0 3.6 3.1*  CL 108 106 108 101 103  CO2 '27 29 29 '$ 33* 37*  GLUCOSE 95 149* 170* 115* 143*  BUN 48* 72* 80* 65* 41*  CREATININE 1.26* 1.28* 1.15* 0.74 0.85  CALCIUM 8.5* 8.8* 9.1 9.5 8.7*  MG 2.1 2.3  --   --   --      Recent Results (from the past 240 hour(s))  SARS Coronavirus 2 by RT PCR (hospital order, performed in [facility] hospital lab) *cepheid single result test* Anterior Nasal Swab     Status: None   Collection Time: 03/30/2022  6:36 AM   Specimen: Anterior Nasal Swab  Result Value Ref Range Status   SARS Coronavirus 2 by RT PCR NEGATIVE NEGATIVE Final    Comment: Performed at [facility], [address] [place], [place] [address]  Blood culture (routine  x 2)     Status: Abnormal   Collection Time: 04/23/2022  6:40 AM   Specimen: BLOOD  Result Value Ref Range Status   Specimen Description   Final    BLOOD LEFT ANTECUBITAL Performed at [facility], [address] [place], [place] [address]    Special Requests   Final    BOTTLES DRAWN AEROBIC AND ANAEROBIC Blood Culture adequate volume Performed at [facility], [address] [place], [place] [address]    Culture  Setup Time   Final    Organism ID to follow GRAM NEGATIVE RODS ANAEROBIC BOTTLE ONLY CRITICAL RESULT CALLED TO, READ BACK BY AND VERIFIED WITH: BRANDON BEERS AT [place] ON 04/04/2022 BY SS GRAM STAIN REVIEWED-AGREE WITH RESULT Performed at [facility], [place] [address] [place], [place] [address]    Culture ESCHERICHIA COLI (A)  Final   Report Status 04/03/2022 FINAL  Final   Organism ID, Bacteria ESCHERICHIA COLI  Final      Susceptibility   Escherichia coli - MIC*    AMPICILLIN <=2 SENSITIVE Sensitive     CEFAZOLIN <=4 SENSITIVE Sensitive     CEFEPIME <=0.12 SENSITIVE Sensitive     CEFTAZIDIME <=1 SENSITIVE Sensitive     CEFTRIAXONE <=0.25 SENSITIVE Sensitive     CIPROFLOXACIN <=0.25  SENSITIVE Sensitive     GENTAMICIN <=1 SENSITIVE Sensitive     IMIPENEM <=0.25 SENSITIVE Sensitive     TRIMETH/SULFA <=20 SENSITIVE Sensitive     AMPICILLIN/SULBACTAM <=2 SENSITIVE Sensitive     PIP/TAZO <=4 SENSITIVE Sensitive     * ESCHERICHIA COLI  Blood culture (routine x 2)     Status: None   Collection Time: 04/15/2022  6:40 AM   Specimen: BLOOD  Result Value Ref Range Status   Specimen Description BLOOD BLOOD RIGHT HAND  Final   Special Requests   Final    BOTTLES DRAWN AEROBIC AND ANAEROBIC Blood Culture results may not be optimal due to an inadequate volume of blood received in culture bottles   Culture   Final    NO GROWTH 5 DAYS Performed at [facility], [address] [place], [place] [address]    Report Status 04/05/2022 FINAL  Final  Blood Culture ID Panel (Reflexed)     Status: Abnormal   Collection Time: 04/07/2022  6:40 AM  Result Value Ref Range Status   Enterococcus faecalis NOT DETECTED NOT DETECTED Final   Enterococcus Faecium NOT DETECTED NOT DETECTED Final   Listeria monocytogenes NOT DETECTED NOT DETECTED Final   Staphylococcus species NOT DETECTED NOT DETECTED Final   Staphylococcus aureus (BCID) NOT DETECTED NOT DETECTED Final   Staphylococcus epidermidis NOT DETECTED NOT DETECTED Final   Staphylococcus lugdunensis NOT DETECTED NOT DETECTED Final   Streptococcus species NOT DETECTED NOT DETECTED Final   Streptococcus agalactiae NOT DETECTED NOT DETECTED Final   Streptococcus pneumoniae NOT DETECTED NOT DETECTED Final   Streptococcus pyogenes NOT DETECTED NOT DETECTED Final   A.calcoaceticus-baumannii NOT DETECTED NOT DETECTED Final   Bacteroides fragilis NOT DETECTED NOT DETECTED Final   Enterobacterales DETECTED (A) NOT DETECTED Final    Comment: Enterobacterales represent a large order of gram negative bacteria, not a single organism. CRITICAL RESULT CALLED TO, READ BACK BY AND VERIFIED WITH: BRANDON BEERS AT 1917 ON 04/16/2022 BY SS     Enterobacter cloacae complex NOT DETECTED NOT DETECTED Final   Escherichia coli DETECTED (A) NOT DETECTED Final    Comment: CRITICAL RESULT CALLED TO, READ BACK BY  AND VERIFIED WITH: BRANDON BEERS AT 1917 ON 04/01/2022 BY SS    Klebsiella aerogenes NOT DETECTED NOT DETECTED Final   Klebsiella oxytoca NOT DETECTED NOT DETECTED Final   Klebsiella pneumoniae NOT DETECTED NOT DETECTED Final   Proteus species NOT DETECTED NOT DETECTED Final   Salmonella species NOT DETECTED NOT DETECTED Final   Serratia marcescens NOT DETECTED NOT DETECTED Final   Haemophilus influenzae NOT DETECTED NOT DETECTED Final   Neisseria meningitidis NOT DETECTED NOT DETECTED Final   Pseudomonas aeruginosa NOT DETECTED NOT DETECTED Final   Stenotrophomonas maltophilia NOT DETECTED NOT DETECTED Final   Candida albicans NOT DETECTED NOT DETECTED Final   Candida auris NOT DETECTED NOT DETECTED Final   Candida glabrata NOT DETECTED NOT DETECTED Final   Candida krusei NOT DETECTED NOT DETECTED Final   Candida parapsilosis NOT DETECTED NOT DETECTED Final   Candida tropicalis NOT DETECTED NOT DETECTED Final   Cryptococcus neoformans/gattii NOT DETECTED NOT DETECTED Final   CTX-M ESBL NOT DETECTED NOT DETECTED Final   Carbapenem resistance IMP NOT DETECTED NOT DETECTED Final   Carbapenem resistance KPC NOT DETECTED NOT DETECTED Final   Carbapenem resistance NDM NOT DETECTED NOT DETECTED Final   Carbapenem resist OXA 48 LIKE NOT DETECTED NOT DETECTED Final   Carbapenem resistance VIM NOT DETECTED NOT DETECTED Final    Comment: Performed at [facility], [address] [place], [place] [address]     Radiology Studies: No results found.  Scheduled Meds:  aspirin EC  81 mg Oral Daily   enoxaparin (LOVENOX) injection  40 mg Subcutaneous Q24H   feeding supplement  237 mL Oral TID BM   furosemide  40 mg Oral Daily   ipratropium-albuterol  3 mL Nebulization Q6H   pantoprazole (PROTONIX) IV  40 mg Intravenous Q24H    Continuous Infusions:  cefTRIAXone (ROCEPHIN)  IV 2 g (04/07/22 1216)   morphine 2 mg/hr (04/07/22 1219)   potassium chloride 10 mEq (04/07/22 1216)     LOS: 7 days   Shelly Coss, MD [facility] P6/13/2023, 12:52 PM

## 2022-04-07 NOTE — Progress Notes (Signed)
Palliative Care Progress Note, Assessment & Plan   Patient Name: Ashley [name]       Date: 04/07/2022 DOB: October 31, 1930  Age: 88 y.o. MRN#: [id number] Attending Physician: Shelly Coss, MD Primary Care Physician: Albina Billet, MD Admit Date: 04/07/2022  Reason for Consultation/Follow-up: Establishing goals of care  Subjective: Patient is sitting in bed in mild distress.  Patient is using accessory muscles to breathe.  She appears to be attempting to be cough.  She is unable to clear the secretion but quickly settles back to rest.  High flow nasal cannula remains in place.  Patient's color appears dusky.  Family is at bedside.  HPI: 88 y.o. female  with past medical history of hypertension, hyperlipidemia, COPD, stroke, GERD, depression/anxiety, peripheral vascular disease, coronary artery disease, GI bleed, subarachnoid hemorrhage, diastolic congestive heart failure, CKD stage IIIa, possible early dementia who presented to the emergency department with complaint of shortness of breath, fall from home.  On presentation she was in respiratory distress.  Found to be hypoxic on presentation, using accessory muscles.  Patient was put on BiPAP.  CT head, CT thoracic/lumbar spine did not show any acute findings.  CT angiogram of the chest revealed multifocal infiltrates.   PMT was consulted given patient's continued decline and need to address [place].   Plan of Care: I have reviewed medical records including EPIC notes, labs and imaging, received report from RN Ashly, assessed the patient and then met with patient and her children Ashley [name], Ashley [name], Ashley [name], and Ashley [name]  to discuss diagnosis prognosis, GOC, EOL wishes, disposition, and options.  We discussed patient's current illness and what it means in the larger context  of patient's on-going co-morbidities.  Natural disease trajectory and expectations at EOL were discussed.  Family expressed that they know that the patient would not want to live like this.  Family shared that patient is ready to go since she made mention of seeing her husband and wanted to go be with him in heaven.  Family is in agreement to ensure patient does not have pain and that she does not suffer. Patient's son Ashley [name] is HCPOA and supports shifting plan of care to comfort measures only. However, family would like to keep telemetry on for external monitoring without monitor turned on in the room. Family also requested that supplemental oxygen not be titrated down or removed.  Reviewed use of morphine for increased work of breathing, breathlessness, and pain.  Discussed use of Ativan and Haldol for agitation and anxiety.  Reviewed that medications, procedures, and interventions not strictly focused on comfort will be discontinued.  Family was in agreement.  Questions and concerns were addressed. The family was encouraged to call with questions or concerns.   Care plan was discussed with patient, patient's sons Ashley [name] and Paramedic, patient's daughters Ashley [name] and Ashley [name]  Code Status: DNR  Prognosis: < 2 weeks  Discharge Planning: To Be Determined  Recommendations/Plan: DNR/DNI remains Full comfort measures Unrestricted visitor access Family has requested telemetry monitor remain on pt but that monitor in the room is turned off Discontinue all medications/procedures not strictly focused on comfort  Physical Exam Vitals reviewed.  Constitutional:      General: She is in  acute distress.     Appearance: She is ill-appearing.     Comments: Appears uncomfortable and miserable  HENT:     Head: Normocephalic and atraumatic.  Cardiovascular:     Rate and Rhythm: Normal rate.  Pulmonary:     Breath sounds: Examination of the left-middle field reveals decreased breath sounds.  Examination of the right-lower field reveals decreased breath sounds. Examination of the left-lower field reveals decreased breath sounds. Decreased breath sounds present.  Abdominal:     Palpations: Abdomen is soft.  Musculoskeletal:     Comments: Generalized weakness  Skin:    General: Skin is warm and dry.  Neurological:     Comments: Nonverbal, did not respond to verbal stimuli, opened eyes once to physical stimuli                 Palliative Assessment/Data: 20%       Total Time 50 minutes  Greater than 50%  of this time was spent counseling and coordinating care related to the above assessment and plan.  Thank you for allowing the Palliative Medicine Team to assist in the care of this patient.  [place] Ilsa Iha, FNP-BC Palliative Medicine Team Team Phone # [phone]

## 2022-04-08 DIAGNOSIS — J441 Chronic obstructive pulmonary disease with (acute) exacerbation: Secondary | ICD-10-CM | POA: Diagnosis not present

## 2022-04-08 DIAGNOSIS — J9621 Acute and chronic respiratory failure with hypoxia: Secondary | ICD-10-CM | POA: Diagnosis not present

## 2022-04-08 DIAGNOSIS — M545 Low back pain, unspecified: Secondary | ICD-10-CM

## 2022-04-08 DIAGNOSIS — N1831 Chronic kidney disease, stage 3a: Secondary | ICD-10-CM | POA: Diagnosis not present

## 2022-04-08 DIAGNOSIS — I5033 Acute on chronic diastolic (congestive) heart failure: Secondary | ICD-10-CM | POA: Diagnosis not present

## 2022-04-21 ENCOUNTER — Other Ambulatory Visit: Payer: Medicare Other | Admitting: Primary Care

## 2022-04-25 NOTE — Death Summary Note (Signed)
Death Summary  Ashley [name] BZJ:[id number] DOB: 07-07-1931 DOA: 04-01-22  PCP: Albina Billet, MD PCP/Office notified:   Admit date: April 01, 2022 Date of Death: 04-09-2022  Final Diagnoses:  Principal Problem:   Acute on chronic respiratory failure with hypoxia ([place]) Active Problems:   Acute on chronic diastolic CHF (congestive heart failure) (HCC)   Multifocal pneumonia   COPD with acute exacerbation (HCC)   Severe sepsis (HCC)   Coronary artery disease   Elevated troponin   Stroke (cerebrum) (HCC)   Hypertension   HLD (hyperlipidemia)   Depression with anxiety   AAA (abdominal aortic aneurysm) (HCC)   Chronic kidney disease, stage 3a ([place])   History of present illness:  HPI was taken from Dr. Blaine Hamper: Ashley [name] is a 88 y.o. female with medical history significant of hypertension, hyperlipidemia, COPD, stroke, GERD, depression with anxiety, PVD, CAD, GI bleeding, SAH, dCHF, CKD-3A, possible early stage of dementia per her son, who presents with shortness of breath and a fall.   Per his son at the bedside, patient was found on the floor with suspected fall at about 4:30 AM.  Patient does not seem to have significant injury.  Patient was found to have acute respiratory distress.  Patient is normally not using oxygen.  She was found to have oxygen desaturating to 80% on room air, using accessory muscle for breathing, BiPAP was started in ED.  Patient has audible wheezing and cough with little mucus production.  Denies chest pain.  No fever or chills.  Patient does not have nausea vomiting, diarrhea or abdominal pain.  No symptoms of UTI.  Patient has bilateral lower leg edema.  Per her son, mental status is at baseline.  Patient is DNR per her son.   Data Reviewed and ED Course: pt was found to have BNP 354, procalcitonin 22.35, troponin level 18, 18, lactic acid 2.7, 2.1, WBC 5.1, negative COVID PCR, D-dimer +2.91, worsening renal function, temperature normal, blood pressure 136/75, heart  rate 109, RR 48, 32.  CT of head is negative for acute intracranial abnormalities.  CT abdomen is negative for acute intra-abdominal issues, but showed 3.3 cm AAA.  Chest x-ray showed bilateral hazy opacity.  CT of T-spine and L-spine are negative for acute injury.  CTA negative for PE, but showed multifocal infiltration.  CT of C-spine is negative for acute injury, but showed dense consolidation versus hemorrhage in the posterior right lung apex. CTA only showed bilateral airspace consolidation. LE doppler negative for DVT.  VBG with pH 7.35, CO2 55 and O2 31. Patient is admitted to PCU as inpt.     CTA 1. No evidence for acute pulmonary embolus. 2. Extensive bilateral airspace consolidation compatible with multifocal pneumonia. 3. No acute findings within the abdomen or pelvis. 4. Infrarenal abdominal aortic aneurysm measures 3.3 cm. Recommend follow-up ultrasound every 3 years. This recommendation follows ACR consensus guidelines: White Paper of the ACR Incidental Findings Committee II on Vascular Findings. J Am Coll Radiol 2013; 01:751-025. 5. Umbilical hernia contains nonobstructed loops of small bowel. 6. Hiatal hernia. 7. Aortic Atherosclerosis (ICD10-I70.0) and Emphysema (ICD10-J43.9).     EKG: I have personally reviewed.  Sinus rhythm, QTc 447, low voltage, nonspecific T wave change  As per Dr. Tawanna Solo: Patient is a 88 year old female with history of hypertension, hyperlipidemia, COPD, stroke, GERD,, depression/anxiety, peripheral vascular disease, coronary artery disease, GI bleed, subarachnoid hemorrhage, diastolic congestive heart failure, CKD stage IIIa, possible early dementia who presented to the emergency department with complaint  of shortness of breath, fall from home.  On presentation she was in respiratory distress.  Found to be hypoxic on presentation, using accessory muscles.  Patient was put on BiPAP.  CT head, CT thoracic/lumbar spine did not show any acute findings.  CT  angiogram of the chest revealed multifocal infiltrates.  Patient continues to decline requiring high flow oxygen.  Palliative care consulted.  Now started on comfort care  As per Dr. Jimmye Norman Apr 11, 2022: Pt was continue on IV morphine drip for comfort care only. Unfortunately, pt passed away at 1406.    Hospital Course:  Acute hypoxic respiratory failure: CT angiogram showed multifocal infiltrates.  CHF, COPD could also have contributed. Continue on supplemental oxygen    Severe sepsis: secondary to multifocal pneumonia & E. coli bacteremia. On comfort care only    Acute on chronic diastolic CHF exacerbation:  found to have bilateral lower extremity edema, elevated JVD.   Echi ub March showed EF of 55 to 53%, grade 1 diastolic dysfunction. Comfort care only    COPD exacerbation: completed course of steroids.     CAD: Mild elevated troponin with flat trend.  Comfort care only.   Hx of CVA:  comfort care only    HTN: comfort care only    HLD: continue comfort care only     AKI : comfort care only    Obesity: BMI 36. Complicates overall care & prognosis    Abdominal aortic aneurysm: Incidental finding on CT scan. Infrarenal abdominal aortic aneurysm measures 3.3 cm  Time of death: 14:06   Signed:  Wyvonnia Dusky  [facility] 04/11/22, 5:31 PM

## 2022-04-25 NOTE — Progress Notes (Signed)
Nutrition Brief Note  Chart reviewed. Pt now transitioning to comfort care.  No further nutrition interventions planned at this time.  Please re-consult as needed.   Haidy Kackley W, RD, LDN, CDCES Registered Dietitian II Certified Diabetes Care and Education Specialist Please refer to [facility] for RD and/or RD on-call/weekend/after hours pager   

## 2022-04-25 NOTE — Progress Notes (Addendum)
Palliative Care Progress Note, Assessment & Plan   Patient Name: Ashley [name]       Date: Apr 27, 2022 DOB: 09/10/1931  Age: 88 y.o. MRN#: [id number] Attending Physician: Wyvonnia Dusky, MD Primary Care Physician: Albina Billet, MD Admit Date: 04/21/2022  Reason for Consultation/Follow-up: Establishing goals of care  Subjective: Patient is actively dying. She has having agonal breathing with periods of apnea up to 30 seconds.  No use of accessory muscles.  Patient's son [name] and her 2 grandsons are at bedside.  HPI: 88 y.o. female  with past medical history of hypertension, hyperlipidemia, COPD, stroke, GERD, depression/anxiety, peripheral vascular disease, coronary artery disease, GI bleed, subarachnoid hemorrhage, diastolic congestive heart failure, CKD stage IIIa, possible early dementia who presented to the emergency department with complaint of shortness of breath, fall from home.   On presentation she was in respiratory distress.  Found to be hypoxic on presentation, using accessory muscles.  Patient was put on BiPAP.  CT head, CT thoracic/lumbar spine did not show any acute findings.  CT angiogram of the chest revealed multifocal infiltrates.    PMT was consulted given patient's continued decline and need to address [place]. After numerous conversations with family, family has elected for comfort care measures only.  Summary of counseling/coordination of care: after reviewing the patient's chart and assessing the patient at bedside, I spoke with patient's son [name] and her 2 grandsons at bedside.  They shared the patient had quite a difficult night where she was calling out help and in severe pain.  Discussed use of morphine, Ativan, Haldol, and repositioning measures to ensure maximizing comfort  for patient.  Patient appears to be resting now and family was in agreement that she seems settled now.  Family asked appropriate questions regarding morphine GTT.  We reviewed signs and symptoms of impending death.  Therapeutic silence and active listening provided for family to share their thoughts and emotions regarding patient's current health status.  Family is excepting the patient has reached end-of-life.  They are appropriately tearful.  PMT will continue to monitor the patient.  Full comfort care remains.  After my encounter with the patient, spoke with patient's nurse and advised that an additional dose of Robinul be given for terminal secretions.  Addendum:  During afternoon rounds, I attempted to symptom check the patient.  Patient had just passed.  Family was appreciative of medical staff support if patient's peaceful death.  Family shared appreciation that patient did not suffer, linger, and was not in pain as she passed.  Emley asked appropriate questions regarding funeral home arrangements.  Nursing at bedside will coordinate care of body and funeral home has already been arranged.  Code Status: DNR  Prognosis: Hours - Days  Discharge Planning: Anticipated Hospital Death  Recommendations/Plan: Continue comfort care measures  Care plan was discussed with patient's son/HCPOA [name], patient's two grandsons at bedside  Physical Exam Vitals reviewed.  Constitutional:      General: She is not in acute distress.    Appearance: She is ill-appearing.     Comments: Actively dying  HENT:     Head: Normocephalic and atraumatic.  Cardiovascular:     Rate and Rhythm: Tachycardia present.  Pulmonary:     Comments: Agonal breathing, no use of accessory muscle, shallow respirations Skin:    Coloration: Skin is pale.     Comments: dusky  Neurological:     Comments: Non verbal             Palliative Assessment/Data: 10%    Total Time 35 minutes  Greater than 50%  of  this time was spent counseling and coordinating care related to the above assessment and plan.  Thank you for allowing the Palliative Medicine Team to assist in the care of this patient.  [place] Ilsa Iha, FNP-BC Palliative Medicine Team Team Phone # [phone]

## 2022-04-25 NOTE — Progress Notes (Signed)
Patient unresponsive, agonal breathing noted. Morphine gtt infusing at '4mg'$ /hr. Family at bedside.

## 2022-04-25 NOTE — Progress Notes (Signed)
PROGRESS NOTE    Ashley [name]  WJX:[id number] DOB: 22-Sep-1931 DOA: 04/11/2022 PCP: Albina Billet, MD    Assessment & Plan:   Principal Problem:   Acute on chronic respiratory failure with hypoxia ([place]) Active Problems:   Acute on chronic diastolic CHF (congestive heart failure) (HCC)   Multifocal pneumonia   COPD with acute exacerbation (HCC)   Severe sepsis (H[facility])   Coronary artery disease   Elevated troponin   Stroke (cerebrum) (HCC)   Hypertension   HLD (hyperlipidemia)   Depression with anxiety   AAA (abdominal aortic aneurysm) (HCC)   Chronic kidney disease, stage 3a ([place])  Assessment and Plan: Acute hypoxic respiratory failure: CT angiogram showed multifocal infiltrates.  CHF, COPD could also have contributed. Continue on supplemental oxygen    Severe sepsis: secondary to multifocal pneumonia & E. coli bacteremia. On comfort care only    Acute on chronic diastolic CHF exacerbation:  found to have bilateral lower extremity edema, elevated JVD.   Echi ub March showed EF of 55 to 21%, grade 1 diastolic dysfunction. Comfort care only    COPD exacerbation: completed course of steroids.     CAD: Mild elevated troponin with flat trend.  Comfort care only.   Hx of CVA:  comfort care only    HTN: comfort care only    HLD: continue comfort care only     AKI : comfort care only    Obesity: BMI 36. Complicates overall care & prognosis    Abdominal aortic aneurysm: Incidental finding on CT scan. Infrarenal abdominal aortic aneurysm measures 3.3 cm.      DVT prophylaxis: comfort care only  Code Status: DNR  Family Communication: discussed pt's care w/ pt's family at bedside and answered their questions  Disposition Plan: likely in hospital death   Level of care: Progressive  Status is: Inpatient Remains inpatient appropriate because: comfort care only, likely in hospital death    Consultants:  Palliative care   Procedures:  Antimicrobials:     Subjective: Pt is obtunded   Objective: Vitals:   04/07/22 0000 04/07/22 0400 04/07/22 0830 04/07/22 1113  BP: (!) 122/50 (!) 98/47 (!) 144/74 138/68  Pulse: (!) 116 93 99 (!) 101  Resp: (!) 30 (!) 28 (!) 23 18  Temp:   97.7 F (36.5 C) (!) 96.3 F (35.7 C)  TempSrc:   Axillary   SpO2: (!) 86% 90% 96% 93%  Weight:      Height:        Intake/Output Summary (Last 24 hours) at Apr 19, 2022 0737 Last data filed at 04/07/2022 1525 Gross per 24 hour  Intake 154.86 ml  Output --  Net 154.86 ml   Filed Weights   04/10/2022 0800  Weight: 89.5 kg    Examination:   No PE exam done  General exam:  Respiratory system:  Cardiovascular system:  Gastrointestinal system:  Central nervous system:  Skin:  Psychiatry:     Data Reviewed: I have personally reviewed following labs and imaging studies  CBC: Recent Labs  Lab 04/02/22 0818 04/03/22 0501 04/05/22 0559 04/07/22 0530  WBC 12.4* 11.2* 10.7* 10.2  HGB 12.6 12.4 14.6 12.2  HCT 41.6 41.1 46.9* 39.7  MCV 92.7 93.0 90.4 91.1  PLT 160 159 180 308   Basic Metabolic Panel: Recent Labs  Lab 04/02/22 0818 04/03/22 0501 04/05/22 0559 04/07/22 0530  NA 143 144 142 145  K 3.4* 4.0 3.6 3.1*  CL 106 108 101 103  CO2 29 29  33* 37*  GLUCOSE 149* 170* 115* 143*  BUN 72* 80* 65* 41*  CREATININE 1.28* 1.15* 0.74 0.85  CALCIUM 8.8* 9.1 9.5 8.7*  MG 2.3  --   --   --    GFR: Estimated Creatinine Clearance: 47.6 mL/min (by C-G formula based on SCr of 0.85 mg/dL). Liver Function Tests: Recent Labs  Lab 04/02/22 0818  AST 15  ALT 17  ALKPHOS 46  BILITOT 0.5  PROT 6.4*  ALBUMIN 2.9*   No results for input(s): "LIPASE", "AMYLASE" in the last 168 hours. No results for input(s): "AMMONIA" in the last 168 hours. Coagulation Profile: No results for input(s): "INR", "PROTIME" in the last 168 hours. Cardiac Enzymes: No results for input(s): "CKTOTAL", "CKMB", "CKMBINDEX", "TROPONINI" in the last 168 hours. BNP (last 3  results) No results for input(s): "PROBNP" in the last 8760 hours. HbA1C: No results for input(s): "HGBA1C" in the last 72 hours. CBG: No results for input(s): "GLUCAP" in the last 168 hours. Lipid Profile: No results for input(s): "CHOL", "HDL", "LDLCALC", "TRIG", "CHOLHDL", "LDLDIRECT" in the last 72 hours. Thyroid Function Tests: No results for input(s): "TSH", "T4TOTAL", "FREET4", "T3FREE", "THYROIDAB" in the last 72 hours. Anemia Panel: No results for input(s): "VITAMINB12", "FOLATE", "FERRITIN", "TIBC", "IRON", "RETICCTPCT" in the last 72 hours. Sepsis Labs: No results for input(s): "PROCALCITON", "LATICACIDVEN" in the last 168 hours.  Recent Results (from the past 240 hour(s))  SARS Coronavirus 2 by RT PCR (hospital order, performed in [facility] hospital lab) *cepheid single result test* Anterior Nasal Swab     Status: None   Collection Time: 04/05/2022  6:36 AM   Specimen: Anterior Nasal Swab  Result Value Ref Range Status   SARS Coronavirus 2 by RT PCR NEGATIVE NEGATIVE Final    Comment: Performed at [facility], [address] [place], [place] [address]  Blood culture (routine x 2)     Status: Abnormal   Collection Time: 04/10/2022  6:40 AM   Specimen: BLOOD  Result Value Ref Range Status   Specimen Description   Final    BLOOD LEFT ANTECUBITAL Performed at [facility], [address], [place], [place] [address]    Special Requests   Final    BOTTLES DRAWN AEROBIC AND ANAEROBIC Blood Culture adequate volume Performed at [facility], [address] [place], [place] [address]    Culture  Setup Time   Final    Organism ID to follow GRAM NEGATIVE RODS ANAEROBIC BOTTLE ONLY CRITICAL RESULT CALLED TO, READ BACK BY AND VERIFIED WITH: BRANDON BEERS AT 1917 ON 03/30/2022 BY SS GRAM STAIN REVIEWED-AGREE WITH RESULT Performed at [facility], [address], [place], [place] [address]    Culture ESCHERICHIA COLI (A)  Final   Report Status  04/03/2022 FINAL  Final   Organism ID, Bacteria ESCHERICHIA COLI  Final      Susceptibility   Escherichia coli - MIC*    AMPICILLIN <=2 SENSITIVE Sensitive     CEFAZOLIN <=4 SENSITIVE Sensitive     CEFEPIME <=0.12 SENSITIVE Sensitive     CEFTAZIDIME <=1 SENSITIVE Sensitive     CEFTRIAXONE <=0.25 SENSITIVE Sensitive     CIPROFLOXACIN <=0.25 SENSITIVE Sensitive     GENTAMICIN <=1 SENSITIVE Sensitive     IMIPENEM <=0.25 SENSITIVE Sensitive     TRIMETH/SULFA <=20 SENSITIVE Sensitive     AMPICILLIN/SULBACTAM <=2 SENSITIVE Sensitive     PIP/TAZO <=4 SENSITIVE Sensitive     * ESCHERICHIA COLI  Blood culture (routine  x 2)     Status: None   Collection Time: 04/17/2022  6:40 AM   Specimen: BLOOD  Result Value Ref Range Status   Specimen Description BLOOD BLOOD RIGHT HAND  Final   Special Requests   Final    BOTTLES DRAWN AEROBIC AND ANAEROBIC Blood Culture results may not be optimal due to an inadequate volume of blood received in culture bottles   Culture   Final    NO GROWTH 5 DAYS Performed at [facility], [address] [place], [place] [address]    Report Status 04/05/2022 FINAL  Final  Blood Culture ID Panel (Reflexed)     Status: Abnormal   Collection Time: 04/18/2022  6:40 AM  Result Value Ref Range Status   Enterococcus faecalis NOT DETECTED NOT DETECTED Final   Enterococcus Faecium NOT DETECTED NOT DETECTED Final   Listeria monocytogenes NOT DETECTED NOT DETECTED Final   Staphylococcus species NOT DETECTED NOT DETECTED Final   Staphylococcus aureus (BCID) NOT DETECTED NOT DETECTED Final   Staphylococcus epidermidis NOT DETECTED NOT DETECTED Final   Staphylococcus lugdunensis NOT DETECTED NOT DETECTED Final   Streptococcus species NOT DETECTED NOT DETECTED Final   Streptococcus agalactiae NOT DETECTED NOT DETECTED Final   Streptococcus pneumoniae NOT DETECTED NOT DETECTED Final   Streptococcus pyogenes NOT DETECTED NOT DETECTED Final   A.calcoaceticus-baumannii NOT  DETECTED NOT DETECTED Final   Bacteroides fragilis NOT DETECTED NOT DETECTED Final   Enterobacterales DETECTED (A) NOT DETECTED Final    Comment: Enterobacterales represent a large order of gram negative bacteria, not a single organism. CRITICAL RESULT CALLED TO, READ BACK BY AND VERIFIED WITH: BRANDON BEERS AT 1917 ON 04/03/2022 BY SS    Enterobacter cloacae complex NOT DETECTED NOT DETECTED Final   Escherichia coli DETECTED (A) NOT DETECTED Final    Comment: CRITICAL RESULT CALLED TO, READ BACK BY AND VERIFIED WITH: BRANDON BEERS AT 1917 ON 04/18/2022 BY SS    Klebsiella aerogenes NOT DETECTED NOT DETECTED Final   Klebsiella oxytoca NOT DETECTED NOT DETECTED Final   Klebsiella pneumoniae NOT DETECTED NOT DETECTED Final   Proteus species NOT DETECTED NOT DETECTED Final   Salmonella species NOT DETECTED NOT DETECTED Final   Serratia marcescens NOT DETECTED NOT DETECTED Final   Haemophilus influenzae NOT DETECTED NOT DETECTED Final   Neisseria meningitidis NOT DETECTED NOT DETECTED Final   Pseudomonas aeruginosa NOT DETECTED NOT DETECTED Final   Stenotrophomonas maltophilia NOT DETECTED NOT DETECTED Final   Candida albicans NOT DETECTED NOT DETECTED Final   Candida auris NOT DETECTED NOT DETECTED Final   Candida glabrata NOT DETECTED NOT DETECTED Final   Candida krusei NOT DETECTED NOT DETECTED Final   Candida parapsilosis NOT DETECTED NOT DETECTED Final   Candida tropicalis NOT DETECTED NOT DETECTED Final   Cryptococcus neoformans/gattii NOT DETECTED NOT DETECTED Final   CTX-M ESBL NOT DETECTED NOT DETECTED Final   Carbapenem resistance IMP NOT DETECTED NOT DETECTED Final   Carbapenem resistance KPC NOT DETECTED NOT DETECTED Final   Carbapenem resistance NDM NOT DETECTED NOT DETECTED Final   Carbapenem resist OXA 48 LIKE NOT DETECTED NOT DETECTED Final   Carbapenem resistance VIM NOT DETECTED NOT DETECTED Final    Comment: Performed at [facility], [address],  [place], [place] [address]         Radiology Studies: No results found.      Scheduled Meds:  ipratropium-albuterol  3 mL Nebulization Q6H   pantoprazole (PROTONIX) IV  40 mg Intravenous  Q24H   Continuous Infusions:  morphine 3 mg/hr (04/07/22 2311)     LOS: 8 days    Time spent: 15 mins     Wyvonnia Dusky, MD [facility] Pager [phone]  If 7PM-7AM, please contact night-coverage Apr 10, 2022, 7:37 AM

## 2022-04-25 DEATH — deceased
